# Patient Record
Sex: Male | Born: 1935 | Race: White | Hispanic: No | Smoking: Former smoker
Health system: Southern US, Community
[De-identification: ages and names within clinical notes are randomized; demographics above are authoritative.]

## PROBLEM LIST (undated history)

## (undated) DIAGNOSIS — N183 Chronic kidney disease, stage 3 unspecified: Secondary | ICD-10-CM

## (undated) DIAGNOSIS — I5042 Chronic combined systolic (congestive) and diastolic (congestive) heart failure: Secondary | ICD-10-CM

## (undated) DIAGNOSIS — I441 Atrioventricular block, second degree: Secondary | ICD-10-CM

## (undated) DIAGNOSIS — K6389 Other specified diseases of intestine: Secondary | ICD-10-CM

## (undated) DIAGNOSIS — I447 Left bundle-branch block, unspecified: Secondary | ICD-10-CM

## (undated) DIAGNOSIS — I2510 Atherosclerotic heart disease of native coronary artery without angina pectoris: Secondary | ICD-10-CM

## (undated) DIAGNOSIS — I714 Abdominal aortic aneurysm, without rupture, unspecified: Secondary | ICD-10-CM

## (undated) DIAGNOSIS — F329 Major depressive disorder, single episode, unspecified: Secondary | ICD-10-CM

## (undated) DIAGNOSIS — I739 Peripheral vascular disease, unspecified: Secondary | ICD-10-CM

## (undated) DIAGNOSIS — N400 Enlarged prostate without lower urinary tract symptoms: Secondary | ICD-10-CM

## (undated) DIAGNOSIS — F32A Depression, unspecified: Secondary | ICD-10-CM

## (undated) DIAGNOSIS — I219 Acute myocardial infarction, unspecified: Secondary | ICD-10-CM

## (undated) DIAGNOSIS — I119 Hypertensive heart disease without heart failure: Secondary | ICD-10-CM

## (undated) DIAGNOSIS — K648 Other hemorrhoids: Secondary | ICD-10-CM

## (undated) DIAGNOSIS — E785 Hyperlipidemia, unspecified: Secondary | ICD-10-CM

## (undated) DIAGNOSIS — I6529 Occlusion and stenosis of unspecified carotid artery: Secondary | ICD-10-CM

## (undated) DIAGNOSIS — Q602 Renal agenesis, unspecified: Secondary | ICD-10-CM

## (undated) DIAGNOSIS — C679 Malignant neoplasm of bladder, unspecified: Secondary | ICD-10-CM

## (undated) DIAGNOSIS — I359 Nonrheumatic aortic valve disorder, unspecified: Secondary | ICD-10-CM

## (undated) DIAGNOSIS — B029 Zoster without complications: Secondary | ICD-10-CM

## (undated) DIAGNOSIS — J449 Chronic obstructive pulmonary disease, unspecified: Secondary | ICD-10-CM

## (undated) DIAGNOSIS — Z950 Presence of cardiac pacemaker: Secondary | ICD-10-CM

## (undated) HISTORY — DX: Left bundle-branch block, unspecified: I44.7

## (undated) HISTORY — DX: Occlusion and stenosis of unspecified carotid artery: I65.29

## (undated) HISTORY — DX: Abdominal aortic aneurysm, without rupture, unspecified: I71.40

## (undated) HISTORY — DX: Peripheral vascular disease, unspecified: I73.9

## (undated) HISTORY — PX: HEMORROIDECTOMY: SUR656

## (undated) HISTORY — DX: Abdominal aortic aneurysm, without rupture: I71.4

## (undated) HISTORY — PX: EYE SURGERY: SHX253

## (undated) HISTORY — PX: HEMIARTHROPLASTY SHOULDER FRACTURE: SUR653

## (undated) HISTORY — PX: CORONARY ARTERY BYPASS GRAFT: SHX141

## (undated) HISTORY — PX: REFRACTIVE SURGERY: SHX103

## (undated) HISTORY — DX: Other hemorrhoids: K64.8

## (undated) HISTORY — PX: CARDIAC CATHETERIZATION: SHX172

## (undated) HISTORY — DX: Other specified diseases of intestine: K63.89

## (undated) HISTORY — DX: Chronic obstructive pulmonary disease, unspecified: J44.9

## (undated) HISTORY — PX: ANAL FISSURE REPAIR: SHX2312

## (undated) HISTORY — DX: Nonrheumatic aortic valve disorder, unspecified: I35.9

## (undated) HISTORY — PX: CORONARY ANGIOPLASTY WITH STENT PLACEMENT: SHX49

---
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
## 1979-01-29 HISTORY — PX: CARPAL TUNNEL RELEASE: SHX101

## 1989-01-28 HISTORY — PX: CAROTID-SUBCLAVIAN BYPASS GRAFT: SHX910

## 1989-01-28 HISTORY — PX: CAROTID ENDARTERECTOMY: SUR193

## 1996-05-30 HISTORY — PX: ABDOMINAL AORTIC ANEURYSM REPAIR: SUR1152

## 1996-05-30 HISTORY — PX: CHOLECYSTECTOMY: SHX55

## 1997-10-28 ENCOUNTER — Other Ambulatory Visit: Admission: RE | Admit: 1997-10-28 | Discharge: 1997-10-28 | Payer: Self-pay | Admitting: Cardiology

## 1997-11-11 ENCOUNTER — Inpatient Hospital Stay (HOSPITAL_COMMUNITY): Admission: EM | Admit: 1997-11-11 | Discharge: 1997-11-15 | Payer: Self-pay | Admitting: General Surgery

## 1998-04-02 ENCOUNTER — Observation Stay (HOSPITAL_COMMUNITY): Admission: AD | Admit: 1998-04-02 | Discharge: 1998-04-03 | Payer: Self-pay | Admitting: Cardiology

## 1998-09-21 ENCOUNTER — Ambulatory Visit (HOSPITAL_COMMUNITY): Admission: RE | Admit: 1998-09-21 | Discharge: 1998-09-22 | Payer: Self-pay | Admitting: Cardiology

## 1998-10-13 ENCOUNTER — Encounter (HOSPITAL_COMMUNITY): Admission: RE | Admit: 1998-10-13 | Discharge: 1998-12-28 | Payer: Self-pay | Admitting: Cardiology

## 1998-10-22 ENCOUNTER — Encounter: Payer: Self-pay | Admitting: Emergency Medicine

## 1998-10-22 ENCOUNTER — Inpatient Hospital Stay (HOSPITAL_COMMUNITY): Admission: EM | Admit: 1998-10-22 | Discharge: 1998-10-27 | Payer: Self-pay | Admitting: Emergency Medicine

## 1998-12-29 ENCOUNTER — Encounter (HOSPITAL_COMMUNITY): Admission: RE | Admit: 1998-12-29 | Discharge: 1999-03-29 | Payer: Self-pay | Admitting: Cardiology

## 1999-01-04 ENCOUNTER — Inpatient Hospital Stay (HOSPITAL_COMMUNITY): Admission: EM | Admit: 1999-01-04 | Discharge: 1999-01-13 | Payer: Self-pay | Admitting: Emergency Medicine

## 1999-01-04 ENCOUNTER — Encounter: Payer: Self-pay | Admitting: Cardiology

## 1999-01-07 ENCOUNTER — Encounter: Payer: Self-pay | Admitting: Surgery

## 1999-01-08 ENCOUNTER — Encounter: Payer: Self-pay | Admitting: Surgery

## 1999-01-09 ENCOUNTER — Encounter: Payer: Self-pay | Admitting: Surgery

## 1999-01-10 ENCOUNTER — Encounter: Payer: Self-pay | Admitting: Surgery

## 1999-01-11 ENCOUNTER — Encounter: Payer: Self-pay | Admitting: Surgery

## 1999-02-09 ENCOUNTER — Encounter (HOSPITAL_COMMUNITY): Admission: RE | Admit: 1999-02-09 | Discharge: 1999-05-10 | Payer: Self-pay | Admitting: Cardiology

## 1999-07-26 ENCOUNTER — Ambulatory Visit (HOSPITAL_COMMUNITY): Admission: RE | Admit: 1999-07-26 | Discharge: 1999-07-26 | Payer: Self-pay | Admitting: Gastroenterology

## 2003-03-05 ENCOUNTER — Encounter: Payer: Self-pay | Admitting: Cardiology

## 2003-03-05 ENCOUNTER — Encounter: Admission: RE | Admit: 2003-03-05 | Discharge: 2003-03-05 | Payer: Self-pay | Admitting: Cardiology

## 2003-03-26 ENCOUNTER — Ambulatory Visit (HOSPITAL_COMMUNITY): Admission: RE | Admit: 2003-03-26 | Discharge: 2003-03-26 | Payer: Self-pay | Admitting: Urology

## 2003-03-28 ENCOUNTER — Ambulatory Visit (HOSPITAL_COMMUNITY): Admission: RE | Admit: 2003-03-28 | Discharge: 2003-03-28 | Payer: Self-pay | Admitting: Urology

## 2003-09-05 ENCOUNTER — Encounter: Admission: RE | Admit: 2003-09-05 | Discharge: 2003-09-05 | Payer: Self-pay | Admitting: Cardiology

## 2003-09-18 ENCOUNTER — Emergency Department (HOSPITAL_COMMUNITY): Admission: EM | Admit: 2003-09-18 | Discharge: 2003-09-18 | Payer: Self-pay | Admitting: Emergency Medicine

## 2005-10-03 ENCOUNTER — Ambulatory Visit: Payer: Self-pay | Admitting: Internal Medicine

## 2005-11-15 ENCOUNTER — Ambulatory Visit: Payer: Self-pay | Admitting: Internal Medicine

## 2005-11-22 ENCOUNTER — Ambulatory Visit: Payer: Self-pay | Admitting: Internal Medicine

## 2005-12-14 ENCOUNTER — Ambulatory Visit: Payer: Self-pay | Admitting: Internal Medicine

## 2006-01-25 ENCOUNTER — Ambulatory Visit: Payer: Self-pay | Admitting: Internal Medicine

## 2006-02-15 ENCOUNTER — Ambulatory Visit: Payer: Self-pay | Admitting: Internal Medicine

## 2006-04-27 ENCOUNTER — Ambulatory Visit: Payer: Self-pay | Admitting: Internal Medicine

## 2006-06-27 ENCOUNTER — Encounter (INDEPENDENT_AMBULATORY_CARE_PROVIDER_SITE_OTHER): Payer: Self-pay | Admitting: Cardiology

## 2006-06-27 ENCOUNTER — Observation Stay (HOSPITAL_COMMUNITY): Admission: EM | Admit: 2006-06-27 | Discharge: 2006-06-28 | Payer: Self-pay | Admitting: Emergency Medicine

## 2006-06-29 ENCOUNTER — Inpatient Hospital Stay (HOSPITAL_COMMUNITY): Admission: EM | Admit: 2006-06-29 | Discharge: 2006-07-03 | Payer: Self-pay | Admitting: Emergency Medicine

## 2006-07-19 ENCOUNTER — Ambulatory Visit: Payer: Self-pay | Admitting: Pulmonary Disease

## 2006-07-20 ENCOUNTER — Encounter: Admission: RE | Admit: 2006-07-20 | Discharge: 2006-09-13 | Payer: Self-pay | Admitting: Orthopedic Surgery

## 2006-09-04 ENCOUNTER — Inpatient Hospital Stay (HOSPITAL_COMMUNITY): Admission: RE | Admit: 2006-09-04 | Discharge: 2006-09-06 | Payer: Self-pay | Admitting: Orthopedic Surgery

## 2006-09-18 ENCOUNTER — Encounter: Admission: RE | Admit: 2006-09-18 | Discharge: 2006-12-07 | Payer: Self-pay | Admitting: Orthopedic Surgery

## 2007-06-07 ENCOUNTER — Ambulatory Visit: Payer: Self-pay | Admitting: Gastroenterology

## 2007-06-20 ENCOUNTER — Ambulatory Visit: Payer: Self-pay | Admitting: Internal Medicine

## 2007-07-05 ENCOUNTER — Ambulatory Visit: Payer: Self-pay | Admitting: Gastroenterology

## 2008-06-19 ENCOUNTER — Ambulatory Visit: Payer: Self-pay | Admitting: Internal Medicine

## 2008-10-06 ENCOUNTER — Encounter: Payer: Self-pay | Admitting: Internal Medicine

## 2008-10-06 ENCOUNTER — Telehealth (INDEPENDENT_AMBULATORY_CARE_PROVIDER_SITE_OTHER): Payer: Self-pay | Admitting: *Deleted

## 2008-12-15 ENCOUNTER — Telehealth: Payer: Self-pay | Admitting: Internal Medicine

## 2009-03-30 ENCOUNTER — Ambulatory Visit: Payer: Self-pay | Admitting: Internal Medicine

## 2009-03-30 DIAGNOSIS — I2510 Atherosclerotic heart disease of native coronary artery without angina pectoris: Secondary | ICD-10-CM

## 2009-03-31 ENCOUNTER — Telehealth: Payer: Self-pay | Admitting: Internal Medicine

## 2009-04-01 ENCOUNTER — Encounter (INDEPENDENT_AMBULATORY_CARE_PROVIDER_SITE_OTHER): Payer: Self-pay | Admitting: *Deleted

## 2009-04-10 LAB — CONVERTED CEMR LAB
Cholesterol: 180 mg/dL
HDL: 30 mg/dL
LDL Cholesterol: 91 mg/dL
Triglyceride fasting, serum: 296 mg/dL

## 2009-05-01 ENCOUNTER — Encounter: Payer: Self-pay | Admitting: Internal Medicine

## 2009-05-11 ENCOUNTER — Ambulatory Visit: Payer: Self-pay | Admitting: Internal Medicine

## 2009-06-11 ENCOUNTER — Telehealth: Payer: Self-pay | Admitting: Internal Medicine

## 2009-06-16 ENCOUNTER — Telehealth: Payer: Self-pay | Admitting: Internal Medicine

## 2009-06-22 ENCOUNTER — Ambulatory Visit: Payer: Self-pay | Admitting: Internal Medicine

## 2009-11-10 ENCOUNTER — Ambulatory Visit: Payer: Self-pay | Admitting: Internal Medicine

## 2009-11-26 ENCOUNTER — Encounter: Payer: Self-pay | Admitting: Internal Medicine

## 2010-04-06 ENCOUNTER — Encounter: Payer: Self-pay | Admitting: Internal Medicine

## 2010-05-11 ENCOUNTER — Ambulatory Visit: Payer: Self-pay | Admitting: Internal Medicine

## 2010-05-17 ENCOUNTER — Ambulatory Visit: Payer: Self-pay | Admitting: Internal Medicine

## 2010-05-19 ENCOUNTER — Ambulatory Visit: Payer: Self-pay | Admitting: Gastroenterology

## 2010-06-29 ENCOUNTER — Ambulatory Visit
Admission: RE | Admit: 2010-06-29 | Discharge: 2010-06-29 | Payer: Self-pay | Source: Home / Self Care | Attending: Internal Medicine | Admitting: Internal Medicine

## 2010-06-29 NOTE — Assessment & Plan Note (Signed)
Summary: Pulmonary/ final regular f/u   Primary Provider/Referring Provider:  Newt Lukes MD  CC:  Yearly followup.  Pt states that his breathing is fine.  He denies any complaints..  History of Present Illness: 69 yowm  who quit smoking in 1998 with an FEV1 of 61% recorded in August of 2007 at wt low 190s maintained on monotherapy  with no significant variability between his good and bad days or need for rescue therapy with primatene  June 19, 2008 ov to refill  spiriva using primatene after exerts for faster recovery.  no increase doe, cough or tendency to exacerbations.  ex tol = > moderate adl's.   June 22, 2009 Yearly followup.  Pt states that his breathing is not limiting him but he is relatively sedentary.  Pt denies any significant sore throat, dysphagia, itching, sneezing,  nasal congestion or excess secretions,  fever, chills, sweats, unintended wt loss, pleuritic or exertional cp, hempoptysis, change in activity tolerance  orthopnea pnd or leg swelling.  Pt also denies any obvious fluctuation in symptoms with weather or environmental change or other alleviating or aggravating factors.        Current Medications (verified): 1)  Plavix 75 Mg  Tabs (Clopidogrel Bisulfate) .... Take 1 Tablet By Mouth Once A Day 2)  Toprol Xl 100 Mg  Tb24 (Metoprolol Succinate) .... Take 1 Tablet By Mouth Once A Day 3)  Crestor 20 Mg  Tabs (Rosuvastatin Calcium) .... Take 1 Tablet By Mouth Once A Day 4)  Spiriva Handihaler 18 Mcg  Caps (Tiotropium Bromide Monohydrate) .... Inhale One Capsule Every Day 5)  Primatene Mist 0.22 Mg/act Aers (Epinephrine Base) .... As Needed 6)  Micardis 40 Mg Tabs (Telmisartan) .... Take 1 By Mouth Qd 7)  Bayer Low Strength 81 Mg Tbec (Aspirin) .... Take 1 By Mouth Qd 8)  Fish Oil 1000 Mg Caps (Omega-3 Fatty Acids) .... Take 1 Once Daily 9)  Zolpidem Tartrate 10 Mg Tabs (Zolpidem Tartrate) .... 1/2-1 Tab By Mouth At Bedtime As Needed For  Insomnia  Allergies (verified): 1)  ! * Codeine 2)  ! Biaxin 3)  ! Percocet  Past History:  Past Medical History: Hypertension COPD    - PFT's 11/15/05  FEV1 46%  ratio 60 with 25% response to B2    - PFT's 01/25/06  FEV1 61%, ratio 46%,  no better after B2 Hyperlipidemia Gout Depression CAD - s/p CABG '89, redo '00 Dyslipidemia chronic LBBB diastolic CHF PVD s/p B CEA  Vital Signs:  Patient profile:   77 year old male Weight:      199 pounds O2 Sat:      98 % on Room air Temp:     98.0 degrees F oral Pulse rate:   42 / minute BP sitting:   120 / 60  (left arm)  Vitals Entered By: Vernie Murders (June 22, 2009 11:48 AM)  O2 Flow:  Room air  Physical Exam  Additional Exam:  wt 197 > 185 June 19, 2008 > 199 June 22, 2009  HEENT mild turbinate edema.  Oropharynx no thrush or excess pnd or cobblestoning.  No JVD or cervical adenopathy. Mild accessory muscle hypertrophy. Trachea midline, nl thryroid. Chest was hyperinflated by percussion with diminished breath sounds and moderate increased exp time without wheeze. Hoover sign positive at mid inspiration. Regular rate and rhythm without murmur gallop or rub or increase P2.  Abd: no hsm, nl excursion. Ext warm without C,C or E.  Impression & Recommendations:  Problem # 1:  COPD (ICD-496) Improvement initially in FEV1 after initiating rx  and now not limited by dyspnea though not very active and has gained wt since quit smoking.  Treatment at this point is entirely directed toward limiting symptoms, which he denies, so there's no need to escalate or modify rx.  I did encourage increase in activity to prevent deconditioning and further wt gain, with option for pulmonary rebhab if he wants to pursue it.  See instructions for specific recommendations   Other Orders: Est. Patient Level III ([address])  Patient Instructions: 1)  You can return here as needed if you're not happy with spiriva and as needed primatene

## 2010-06-29 NOTE — Progress Notes (Signed)
Summary: prescript-  Phone Note Call from Patient   Caller: Patient Call For: Beonka Amesquita Summary of Call: need prescript for spiriva 3 months supply sent to [facility] Initial call taken by: Rickard Patience,  June 11, 2009 12:06 PM  Follow-up for Phone Call        per last OV note 05/2008, pt was directed to est with a PCP and have them refill pt's spiriva.  pt has since become est with Dr. Felicity Coyer.  LMOM TCB. Boone Master CNA  June 11, 2009 12:17 PM   Additional Follow-up for Phone Call Additional follow up Details #1::        LMTCB. Carron Curie CMA  June 12, 2009 3:47 PM  pt advised to get refills form PMD. Pt states he ahs appt with MW next week and will discuss with him at that time.Carron Curie CMA  June 15, 2009 11:41 AM     Additional Follow-up for Phone Call Additional follow up Details #2::    Returning triage's phone call, would like a call back. Follow-up by: Darletta Moll,  June 11, 2009 3:00 PM

## 2010-06-29 NOTE — Letter (Signed)
Summary: Viann Fish MD  Viann Fish MD   Imported By: Sherian Rein 04/15/2010 15:02:03  _____________________________________________________________________  External Attachment:    Type:   Image     Comment:   External Document

## 2010-06-29 NOTE — Assessment & Plan Note (Signed)
Summary: 6 mos f/u // # / cd   Vital Signs:  Patient profile:   77 year old male Height:      70 inches (177.80 cm) Weight:      193.8 pounds (88.09 kg) O2 Sat:      94 % on Room air Temp:     97.0 degrees F (36.11 degrees C) oral Pulse rate:   57 / minute BP sitting:   122 / 70  (left arm) Cuff size:   regular  Vitals Entered By: Orlan Leavens (November 10, 2009 10:04 AM)  O2 Flow:  Room air CC: 6 month f/u. Pt is req rx for Temazepam Is Patient Diabetic? No Pain Assessment Patient in pain? no        Primary Care Provider:  Newt Lukes MD  CC:  6 month f/u. Pt is req rx for Temazepam.  History of Present Illness: here for followup -  c/o continued insomnia -  onset summer 2010 following death of wife assoc with daytime fatigue and sleepiness distracted by racing thoughts and "second guessing" about the end of life mgmt of his late wife denies "depression" - "its just a normal for me without her" denies SI or tearfulness prior trial of restoril from cardiologist back in May 2010 worked better than Ambein 10mg  left over from wife's meds prev has tried trazadone and elavil w/o change in symptoms   CAD hx - labs from dr. Donnie Aho (cards) reviewed today denies CP or angina symptoms - no recent changes in cardiac meds planning nuc stres test next week but denies symptoms of angina "its just time to check i think"  CPOD - reports compliance with ongoing medical treatment and no changes in medication dose or frequency. denies adverse side effects related to current therapy. no SOB, DOE -   right shoulder  "frozen" c/o limited Right USEuse - hard to lift arm overhead since surg 2 years ago no pain in arm, hand or the shoulder "if i take my hydrocodone now and then" no neck pain or numbness   Clinical Review Panels:  Lipid Management   Cholesterol:  180 (04/10/2009)   LDL (bad choesterol):  91 (04/10/2009)   HDL (good cholesterol):  30 (04/10/2009)   Triglycerides:   296 (04/10/2009)   Current Medications (verified): 1)  Plavix 75 Mg  Tabs (Clopidogrel Bisulfate) .... Take 1 Tablet By Mouth Once A Day 2)  Toprol Xl 100 Mg  Tb24 (Metoprolol Succinate) .... Take 1 Tablet By Mouth Once A Day 3)  Crestor 20 Mg  Tabs (Rosuvastatin Calcium) .... Take 1 Tablet By Mouth Once A Day 4)  Spiriva Handihaler 18 Mcg  Caps (Tiotropium Bromide Monohydrate) .... Inhale One Capsule Every Day 5)  Primatene Mist 0.22 Mg/act Aers (Epinephrine Base) .... As Needed 6)  Micardis 40 Mg Tabs (Telmisartan) .... Take 1 By Mouth Qd 7)  Bayer Low Strength 81 Mg Tbec (Aspirin) .... Take 1 By Mouth Qd 8)  Fish Oil 1000 Mg Caps (Omega-3 Fatty Acids) .... Take 1 Once Daily  Allergies (verified): 1)  ! * Codeine 2)  ! Biaxin 3)  ! Percocet  Past History:  Past Medical History: Hypertension COPD    - PFT's 11/15/05  FEV1 46%  ratio 60 with 25% response to B2    - PFT's 01/25/06  FEV1 61%, ratio 46%,  no better after B2 Hyperlipidemia Gout Depression CAD - s/p CABG '89, redo '00 Dyslipidemia chronic LBBB diastolic CHF PVD s/p B CEA  MD roster: pulm - wert card - tilley  Review of Systems  The patient denies anorexia, fever, weight loss, and syncope.    Physical Exam  General:  alert, well-developed, well-nourished, and cooperative to examination.    Lungs:  normal respiratory effort, no intercostal retractions or use of accessory muscles; normal breath sounds bilaterally - no crackles and no wheezes.    Heart:  normal rate, regular rhythm, no murmur, and no rub. BLE without edema Psych:  Oriented X3, memory intact for recent and remote, less depressed affect, good eye contact, not  tearful nor easily distracted.     Impression & Recommendations:  Problem # 1:  INSOMNIA, CHRONIC (ICD-307.42)  use restoril as Ambien, trazadone and elavil ineffective-  advised on limiting its use to minimize tolerance needs f/u every 6 mos or sooner  Problem # 2:  HYPERLIPIDEMIA  (ICD-272.4)  His updated medication list for this problem includes:    Crestor 20 Mg Tabs (Rosuvastatin calcium) .Marland Kitchen... Take 1 tablet by mouth once a day  labs/med followed by cards    HDL:30 (04/10/2009)  LDL:91 (04/10/2009)  Chol:180 (04/10/2009)  Trig:296 (04/10/2009)  Problem # 3:  HYPERTENSION (ICD-401.9)  His updated medication list for this problem includes:    Toprol Xl 100 Mg Tb24 (Metoprolol succinate) .Marland Kitchen... Take 1 tablet by mouth once a day    Micardis 40 Mg Tabs (Telmisartan) .Marland Kitchen... Take 1 by mouth qd  BP today: 122/70 Prior BP: 120/60 (06/22/2009)  Labs Reviewed: Chol: 180 (04/10/2009)   HDL: 30 (04/10/2009)   LDL: 91 (04/10/2009)   TG: 296 (04/10/2009)  Problem # 4:  COPD (ICD-496)  His updated medication list for this problem includes:    Spiriva Handihaler 18 Mcg Caps (Tiotropium bromide monohydrate) ..... Inhale one capsule every day    Primatene Mist 0.22 Mg/act Aers (Epinephrine base) .Marland Kitchen... As needed per pulm - symptoms well controlled Improvement initially in FEV1 after initiating rx  and now not limited by dyspnea though not very active and has gained wt since quit smoking.  Treatment at this point is entirely directed toward limiting symptoms, which he denies, so there's no need to escalate or modify rx.  I did encourage increase in activity to prevent deconditioning and further wt gain, with option for pulmonary rebhab if he wants to pursue it.  Complete Medication List: 1)  Plavix 75 Mg Tabs (Clopidogrel bisulfate) .... Take 1 tablet by mouth once a day 2)  Toprol Xl 100 Mg Tb24 (Metoprolol succinate) .... Take 1 tablet by mouth once a day 3)  Crestor 20 Mg Tabs (Rosuvastatin calcium) .... Take 1 tablet by mouth once a day 4)  Spiriva Handihaler 18 Mcg Caps (Tiotropium bromide monohydrate) .... Inhale one capsule every day 5)  Primatene Mist 0.22 Mg/act Aers (Epinephrine base) .... As needed 6)  Micardis 40 Mg Tabs (Telmisartan) .... Take 1 by mouth qd 7)   Bayer Low Strength 81 Mg Tbec (Aspirin) .... Take 1 by mouth qd 8)  Fish Oil 1000 Mg Caps (Omega-3 fatty acids) .... Take 1 once daily 9)  Temazepam 15 Mg Caps (Temazepam) .Marland Kitchen.. 1 by mouth at bedtime as needed  Patient Instructions: 1)  it was good to see you today.  2)  restoril prescribed for your insomnia - use with caution as discussed to minimze your development of medication tolerance  3)  continue followup with dr. Donnie Aho and wert as ongoing 4)  Please schedule a follow-up appointment in 6 months, sooner if problems.  Prescriptions: TEMAZEPAM 15 MG CAPS (TEMAZEPAM) 1 by mouth at bedtime as needed  #30 x 5   Entered and Authorized by:   Newt Lukes MD   Signed by:   Newt Lukes MD on 11/10/2009   Method used:   Print then Give to Patient   RxID:   [id number]

## 2010-06-29 NOTE — Progress Notes (Signed)
Summary: RX REQUEST  Phone Note Call from Patient Call back at Home Phone [phone]   Caller: Patient Call For: Edward Lukes MD Summary of Call: Pt came into the office and stated that Dr. Sherene Sires wants him to get a refill of SPIRIVA through his PCP. Pt stated that he used [facility] and he requesting 90 days supplies. Please advice.  Initial call taken by: Livingston Diones,  June 16, 2009 10:39 AM  Follow-up for Phone Call        ok to send to [facility] as requested Follow-up by: Edward Lukes MD,  June 16, 2009 11:02 AM  Additional Follow-up for Phone Call Additional follow up Details #1::        Called pt no ansew [facility] rx sent to [facility] Additional Follow-up by: Orlan Leavens,  June 16, 2009 11:25 AM    Prescriptions: SPIRIVA HANDIHALER 18 MCG  CAPS (TIOTROPIUM BROMIDE MONOHYDRATE) inhale one capsule every day  #90 x 3   Entered by:   Orlan Leavens   Authorized by:   Edward Lukes MD   Signed by:   Orlan Leavens on 06/16/2009   Method used:   Faxed to ...       [facility]* (mail-order)             ,          Ph: [phone]       Fax: [phone]   RxID:   [id number]

## 2010-07-01 NOTE — Assessment & Plan Note (Signed)
Summary: 6 MTH FU--STC   Vital Signs:  Patient profile:   77 year old male Height:      70 inches (177.80 cm) Weight:      189.8 pounds (86.27 kg) BMI:     27.33 O2 Sat:      92 % on Room air Temp:     97.9 degrees F (36.61 degrees C) oral Pulse rate:   74 / minute BP sitting:   102 / 60  (left arm) Cuff size:   regular  Vitals Entered By: Orlan Leavens RMA (May 11, 2010 10:44 AM)  O2 Flow:  Room air CC: 6 month follow-up Is Patient Diabetic? No Pain Assessment Patient in pain? no      Comments Pt also c/o chest congestion x's 3 days   Primary Care Provider:  Newt Lukes MD  CC:  6 month follow-up.  History of Present Illness: here for followup -  hx insomnia -  onset summer 2010 following death of wife assoc with daytime fatigue and sleepiness distracted by racing thoughts and "second guessing" about the end of life mgmt of his late wife denies "depression" - "its just a normal for me without her" denies SI or tearfulness prior trial of restoril worked better than Ambein 10mg  left over from wife's meds prev has tried trazadone and elavil w/o change in symptoms  resolved with restoril - needs refill on same  CAD hx - labs from dr. Donnie Aho (cards) reviewed today denies CP or angina symptoms - no recent changes in cardiac meds planning nuc stres test next week but denies symptoms of angina "its just time to check i think"  COPD - new cough symptoms x 2 weeks, green sputum, no fever - reports compliance with ongoing medical treatment and no changes in medication dose or frequency. denies adverse side effects related to current therapy. no SOB, DOE -   right shoulder  "frozen" c/o limited Right USEuse - hard to lift arm overhead since surg 2 years ago no pain in arm, hand or the shoulder "if i take my hydrocodone now and then" no neck pain or numbness   Clinical Review Panels:  Lipid Management   Cholesterol:  180 (04/10/2009)   LDL (bad choesterol):   91 (04/10/2009)   HDL (good cholesterol):  30 (04/10/2009)   Triglycerides:  296 (04/10/2009)   Current Medications (verified): 1)  Plavix 75 Mg  Tabs (Clopidogrel Bisulfate) .... Take 1 Tablet By Mouth Once A Day 2)  Toprol Xl 100 Mg  Tb24 (Metoprolol Succinate) .... Take 1 Tablet By Mouth Once A Day 3)  Crestor 20 Mg  Tabs (Rosuvastatin Calcium) .... Take 1 Tablet By Mouth Once A Day 4)  Spiriva Handihaler 18 Mcg  Caps (Tiotropium Bromide Monohydrate) .... Inhale One Capsule Every Day 5)  Primatene Mist 0.22 Mg/act Aers (Epinephrine Base) .... As Needed 6)  Micardis 40 Mg Tabs (Telmisartan) .... Take 1 By Mouth Qd 7)  Bayer Low Strength 81 Mg Tbec (Aspirin) .... Take 1 By Mouth Qd 8)  Fish Oil 1000 Mg Caps (Omega-3 Fatty Acids) .... Take 1 Once Daily 9)  Temazepam 15 Mg Caps (Temazepam) .Marland Kitchen.. 1 By Mouth At Bedtime As Needed  Allergies (verified): 1)  ! * Codeine 2)  ! Biaxin 3)  ! Percocet  Past History:  Past Medical History: Hypertension COPD    - PFT's 11/15/05  FEV1 46%  ratio 60 with 25% response to B2    - PFT's 01/25/06  FEV1  61%, ratio 46%,  no better after B2 Hyperlipidemia Gout Depression CAD - s/p CABG '89, redo '00  Dyslipidemia chronic LBBB diastolic CHF PVD s/p B CEA  MD roster:  pulm - wert card - tilley  Review of Systems  The patient denies weight gain, chest pain, syncope, and headaches.    Physical Exam  General:  alert, well-developed, well-nourished, and cooperative to examination.    Lungs:  normal respiratory effort, no intercostal retractions or use of accessory muscles; good breath sounds bilaterally - no crackles and no wheezes but few scattered rhonchi   Heart:  normal rate, regular rhythm, no murmur, and no rub. BLE without edema   Impression & Recommendations:  Problem # 1:  CHRONIC OBSTRUCTIVE PULMONARY DISEASE, ACUTE EXACERBATION (ICD-491.21)  no wheeze so hold steroids but add 7d doxy for productive sputum - erx  done  Orders: Prescription Created Electronically [phone])  Problem # 2:  COPD (ICD-496)  The following medications were removed from the medication list:    Primatene Mist 0.22 Mg/act Aers (Epinephrine base) .Marland Kitchen... As needed His updated medication list for this problem includes:    Spiriva Handihaler 18 Mcg Caps (Tiotropium bromide monohydrate) ..... Inhale one capsule every day   per pulm - symptoms generally well controlled Improvement initially in FEV1 after initiating rx  and now not limited by dyspnea though not very active and has gained wt since quit smoking.  Treatment at this point is entirely directed toward limiting symptoms, which he denies, so there's no need to escalate or modify rx.  I did encourage increase in activity to prevent deconditioning and further wt gain, with option for pulmonary rebhab if he wants to pursue it.  Pulmonary Functions Reviewed: O2 sat: 92 (05/11/2010)     Vaccines Reviewed: Flu Vax: Fluvax 3+ (03/30/2009)  Problem # 3:  INSOMNIA, CHRONIC (ICD-307.42)  use restoril as Ambien, trazadone and elavil ineffective- refill today advised on limiting its use to minimize tolerance needs f/u every 6 mos or sooner  Problem # 4:  HYPERTENSION (ICD-401.9)  His updated medication list for this problem includes:    Toprol Xl 100 Mg Tb24 (Metoprolol succinate) .Marland Kitchen... Take 1 tablet by mouth once a day    Micardis 40 Mg Tabs (Telmisartan) .Marland Kitchen... Take 1 by mouth qd  BP today: 102/60 Prior BP: 122/70 (11/10/2009)  Labs Reviewed: Chol: 180 (04/10/2009)   HDL: 30 (04/10/2009)   LDL: 91 (04/10/2009)   TG: 296 (04/10/2009)  Complete Medication List: 1)  Plavix 75 Mg Tabs (Clopidogrel bisulfate) .... Take 1 tablet by mouth once a day 2)  Toprol Xl 100 Mg Tb24 (Metoprolol succinate) .... Take 1 tablet by mouth once a day 3)  Crestor 20 Mg Tabs (Rosuvastatin calcium) .... Take 1 tablet by mouth once a day 4)  Spiriva Handihaler 18 Mcg Caps (Tiotropium bromide  monohydrate) .... Inhale one capsule every day 5)  Micardis 40 Mg Tabs (Telmisartan) .... Take 1 by mouth qd 6)  Bayer Low Strength 81 Mg Tbec (Aspirin) .... Take 1 by mouth qd 7)  Fish Oil 1000 Mg Caps (Omega-3 fatty acids) .... Take 1 once daily 8)  Temazepam 15 Mg Caps (Temazepam) .Marland Kitchen.. 1 by mouth at bedtime as needed 9)  Doxycycline Hyclate 100 Mg Tabs (Doxycycline hyclate) .Marland Kitchen.. 1 by mouth two times a day x 7 days  Patient Instructions: 1)  it was good to see you today.  2)  restoril refill prescribed for your insomnia today- continue to use with caution  to minimze your development of medication tolerance  3)  continue followup with dr. Donnie Aho and wert as ongoing 4)  doxycycline antibioitcs for ocugh symptoms -  5)  your prescriptions have been electronically submitted or faxed to your pharmacy. Please take as directed. Contact our office if you believe you're having problems with the medication(s). 6)  Please schedule a follow-up appointment in 6 months, sooner if problems.  7)  Get plenty of rest, drink lots of clear liquids, and use Tylenol or Ibuprofen for fever and comfort. Return in 7-10 days if you're not better:sooner if you're feeling worse. Prescriptions: TEMAZEPAM 15 MG CAPS (TEMAZEPAM) 1 by mouth at bedtime as needed  #30 x 5   Entered and Authorized by:   Newt Lukes MD   Signed by:   Newt Lukes MD on 05/11/2010   Method used:   Printed then faxed to ...       [facility]  [address] [id number]* (retail)       [address]       [place], [place]  [address]       Ph: [phone] or [phone]       Fax: [phone]   RxID:   [id number] DOXYCYCLINE HYCLATE 100 MG TABS (DOXYCYCLINE HYCLATE) 1 by mouth two times a day x 7 days  #14 x 0   Entered and Authorized by:   Newt Lukes MD   Signed by:   Newt Lukes MD on 05/11/2010   Method used:   Electronically to        [facility]  [address] [id number]* (retail)       [address]       [place], [place]  [address]       Ph: [phone] or [phone]       Fax: [phone]   RxID:   [id number]    Orders Added: 1)  Est. Patient Level IV [83151] 2)  Prescription Created Electronically [phone]

## 2010-07-01 NOTE — Assessment & Plan Note (Signed)
Summary: Pulmonary/ ext ov with dpi teaching 90%   Primary Provider/Referring Provider:  Newt Lukes MD  CC:  Dyspnea- the same .  History of Present Illness: 41  yowm  who quit smoking in 1998 with an FEV1 of 61% recorded in August of 2007 at wt low 190s maintained on monotherapy  with no significant variability between his good and bad days or need for rescue therapy with primatene.  June 19, 2008 ov to refill  spiriva using primatene after exerts for faster recovery.  no increase doe, cough or tendency to exacerbations.  ex tol = > moderate adl's.   June 22, 2009 Yearly followup.  Pt states that his breathing is not limiting him but he is relatively sedentary.  rec  continue spriva  May 17, 2010 ov cc sob worse since ran out of primatene, apparenlty using this much more than reported at last ov.  no purulent sputum.but coughing more now   Pt denies any significant sore throat, dysphagia, itching, sneezing,  nasal congestion or excess secretions,  fever, chills, sweats, unintended wt loss, pleuritic or exertional cp, hempoptysis, change in activity tolerance  orthopnea pnd or leg swelling Pt also denies any obvious fluctuation in symptoms with weather or environmental change or other alleviating or aggravating factors.       Current Medications (verified): 1)  Plavix 75 Mg  Tabs (Clopidogrel Bisulfate) .... Take 1 Tablet By Mouth Once A Day 2)  Toprol Xl 100 Mg  Tb24 (Metoprolol Succinate) .... Take 1 Tablet By Mouth Once A Day 3)  Crestor 20 Mg  Tabs (Rosuvastatin Calcium) .... Take 1 Tablet By Mouth Once A Day 4)  Spiriva Handihaler 18 Mcg  Caps (Tiotropium Bromide Monohydrate) .... Inhale One Capsule Every Day 5)  Micardis 40 Mg Tabs (Telmisartan) .... Take 1 By Mouth Qd 6)  Bayer Low Strength 81 Mg Tbec (Aspirin) .... Take 1 By Mouth Qd 7)  Fish Oil 1000 Mg Caps (Omega-3 Fatty Acids) .... Take 1 Once Daily 8)  Temazepam 15 Mg Caps (Temazepam) .Marland Kitchen.. 1 By Mouth At  Bedtime As Needed 9)  Doxycycline Hyclate 100 Mg Tabs (Doxycycline Hyclate) .Marland Kitchen.. 1 By Mouth Two Times A Day X 7 Days  Allergies (verified): 1)  ! * Codeine 2)  ! Biaxin 3)  ! Percocet  Past History:  Past Medical History: Hypertension COPD    - PFT's 11/15/05  FEV1 46%  ratio 60 with 25% response to B2    - PFT's 01/25/06  FEV1 61%, ratio 46%,  no better after B2    - Add on Advair May 18, 2010  Hyperlipidemia Gout Depression CAD - s/p CABG '89, redo '00  Dyslipidemia chronic LBBB diastolic CHF PVD s/p B CEA  MD roster:  pulm - wert card - tilley  Vital Signs:  Patient profile:   77 year old male Weight:      192 pounds O2 Sat:      99 % on Room air Temp:     98.2 degrees F oral Pulse rate:   82 / minute BP sitting:   118 / 60  (left arm)  Vitals Entered By: Vernie Murders (May 17, 2010 10:19 AM)  O2 Flow:  Room air  Physical Exam  Additional Exam:  wt 197 > 185 June 19, 2008 > 199 June 22, 2009 > 192  May 17, 2010  HEENT mild turbinate edema.  Oropharynx no thrush or excess pnd or cobblestoning.  No JVD or cervical adenopathy.  Mild accessory muscle hypertrophy. Trachea midline, nl thryroid. Chest was hyperinflated by percussion with diminished breath sounds and moderate increased exp time without wheeze. Hoover sign positive at mid inspiration. Regular rate and rhythm without murmur gallop or rub or increase P2.  Abd: no hsm, nl excursion. Ext warm without C,C or E.     Impression & Recommendations:  Problem # 1:  COPD (ICD-496)   DDX of  difficult airways managment all start with A and  include Adherence, Ace Inhibitors, Acid Reflux, Active Sinus Disease, Alpha 1 Antitripsin deficiency, Anxiety masquerading as Airways dz,  ABPA,  allergy(esp in young), Aspiration (esp in elderly), Adverse effects of DPI,  Active smokers, plus one B  = Beta blocker use.  In this case Adherence is the biggest issue and starts with  inability to use HFA  effectively and also  understand that SABA treats the symptoms but doesn't get to the underlying problem (inflammation).  I used  the ananology of putting steroid cream on a rash to help explain the meaning of topical therapy and the need to get the drug to the target tissue.    Try advair I spent extra time with the patient today explaining optimal dpi  technique.  This improved from  75-90%  ? Acid reflux contributing to symptoms, See instructions for specific recommendations   ? Adverse effect of dpi > if so will be worse on advair  Other Orders: Est. Patient Level IV (95621)  Patient Instructions: 1)  Start Advair 250 / 50 one twice daily and rinse and gargle but cough gets worse ok to stop it  2)  Prilosec before bfast and pepcid 20 mg at bedtime as long as coughing ( reflux is to cough what oxygen is to fire)  3)  GERD (REFLUX)  is a common cause of respiratory symptoms. It commonly presents without heartburn and can be treated with medication, but also with lifestyle changes including avoidance of late meals, excessive alcohol, smoking cessation, and avoid fatty foods, chocolate, peppermint, colas, red wine, and acidic juices such as orange juice. NO MINT OR MENTHOL PRODUCTS SO NO COUGH DROPS  4)  USE SUGARLESS CANDY INSTEAD (jolley ranchers)  5)  NO OIL BASED VITAMINS  6)  Please schedule a follow-up appointment in 4  weeks, sooner if needed  with cxr on return   Appended Document: Orders Update     Clinical Lists Changes  Orders: Added new Service order of Est. Patient Level IV (30865) - Signed

## 2010-07-07 NOTE — Assessment & Plan Note (Signed)
Summary: Pulmonary/ ov, better on advair, ? new SPN R   Primary Provider/Referring Provider:  Newt Lukes MD  CC:  Dyspnea- the same.  History of Present Illness: 63  yowm  who quit smoking in 1998 with an FEV1 of 61% recorded in August of 2007 at wt low 190s maintained on monotherapy  with no significant variability between his good and bad days or need for rescue therapy with primatene.  June 19, 2008 ov to refill  spiriva using primatene after exerts for faster recovery.  no increase doe, cough or tendency to exacerbations.  ex tol = > moderate adl's.   June 22, 2009 Yearly followup.  Pt states that his breathing is not limiting him but he is relatively sedentary.  rec  continue spriva  May 17, 2010 ov cc sob worse since ran out of primatene, apparenlty using this much more than reported at last ov.  no purulent sputum.but coughing more now rec Start Advair 250 / 50 one twice daily and rinse and gargle but cough gets worse ok to stop it  Prilosec before bfast and pepcid 20 mg at bedtime as long as coughing ( reflux is to cough what oxygen is to fire)  GERD (REFLUX)  diet cxr on return   June 29, 2010 ov doe x 150 yeards, does shopping ok, less need for primatene, sleeping ok. Pt denies any significant sore throat, dysphagia, itching, sneezing,  nasal congestion or excess secretions,  fever, chills, sweats, unintended wt loss, pleuritic or exertional cp, hempoptysis, change in activity tolerance  orthopnea pnd or leg swelling     Current Medications (verified): 1)  Plavix 75 Mg  Tabs (Clopidogrel Bisulfate) .... Take 1 Tablet By Mouth Once A Day 2)  Toprol Xl 100 Mg  Tb24 (Metoprolol Succinate) .... Take 1 Tablet By Mouth Once A Day 3)  Crestor 20 Mg  Tabs (Rosuvastatin Calcium) .... Take 1 Tablet By Mouth Once A Day 4)  Spiriva Handihaler 18 Mcg  Caps (Tiotropium Bromide Monohydrate) .... Inhale One Capsule Every Day 5)  Micardis 40 Mg Tabs (Telmisartan) ....  Take 1 By Mouth Qd 6)  Bayer Low Strength 81 Mg Tbec (Aspirin) .... Take 1 By Mouth Qd 7)  Fish Oil 1000 Mg Caps (Omega-3 Fatty Acids) .... Take 1 Once Daily 8)  Temazepam 15 Mg Caps (Temazepam) .Marland Kitchen.. 1 By Mouth At Bedtime As Needed 9)  Advair Diskus 250-50 Mcg/dose Aepb (Fluticasone-Salmeterol) .Marland Kitchen.. 1 Puff Two Times A Day As Needed  Allergies (verified): 1)  ! * Codeine 2)  ! Biaxin 3)  ! Percocet  Past History:  Past Medical History: Hypertension COPD    - PFT's 11/15/05  FEV1 46%  ratio 60 with 25% response to B2    - PFT's 01/25/06  FEV1 61%, ratio 46%,  no better after B2    - Add on Advair May 18, 2010 >  iimproved June 29, 2010  Hyperlipidemia Gout Depression CAD - s/p CABG '89, redo '00  Dyslipidemia chronic LBBB diastolic CHF PVD s/p B CEA  MD roster:  pulm - Hardeep Reetz card - tilley  Vital Signs:  Patient profile:   77 year old male Weight:      194 pounds O2 Sat:      96 % on Room air Temp:     98.1 degrees F oral Pulse rate:   63 / minute BP sitting:   110 / 60  (left arm)  Vitals Entered By: Vernie Murders (June 29, 2010  10:26 AM)  O2 Flow:  Room air CC: Dyspnea- the same Comments PT CAME BACK INTO OFFICE WANTS RX FOR ADVAIR AND SPIRIVA S[facility]; [facility]  .Kandice Hams [facility]  June 29, 2010 12:46 PM    Physical Exam  Additional Exam:  wt 197 > 185 June 19, 2008 > 199 June 22, 2009 > 192  May 17, 2010 > 194 June 29, 2010  HEENT mild turbinate edema.  Oropharynx no thrush or excess pnd or cobblestoning.  No JVD or cervical adenopathy. Mild accessory muscle hypertrophy. Trachea midline, nl thryroid. Chest was hyperinflated by percussion with diminished breath sounds and moderate increased exp time without wheeze. Hoover sign positive at mid inspiration. Regular rate and rhythm without murmur gallop or rub or increase P2. No edema. Abd: no hsm, nl excursion. Ext warm without cyanosis or clubbing      CXR  Procedure date:  06/29/2010  Findings:      Comparison: June 19, 2008   Findings: The cardiac silhouette, mediastinum, pulmonary vasculature are within normal limits.  A small nodule in the right lower lobe is suspected.  There is no acute bony abnormality.   IMPRESSION: Question new right lower lobe nodule.  CT scan of the chest is recommended.  Impression & Recommendations:  Problem # 1:  COPD (ICD-496)  GOLD II severity  DDX of  difficult airways managment all start with A and  include Adherence, Ace Inhibitors, Acid Reflux, Active Sinus Disease, Alpha 1 Antitripsin deficiency, Anxiety masquerading as Airways dz,  ABPA,  allergy(esp in young), Aspiration (esp in elderly), Adverse effects of DPI,  Active smokers, plus two Bs  = Bronchiectasis and Beta blocker use..and one C= CHF      Tried  advair last ov > better     ? Acid reflux contributing to symptoms, See instructions for specific recommendations   ? Adverse effect of dpi >  tolerating dpi so far   Problem # 2:  PULMONARY NODULE (ICD-518.89)   cxr's reviewed, not seen on cxr 04/2009 but also not seen on lateral view and ? nipple shadow.  very poor candidate for any form of early intervention so rec f/u 3 months with cxr with nipple markers  Medications Added to Medication List This Visit: 1)  Advair Diskus 250-50 Mcg/dose Aepb (Fluticasone-salmeterol) .Marland Kitchen.. 1 puff two times a day as needed  Other Orders: Est. Patient Level III (99213) T-2 View CXR (71020TC)  Patient Instructions: 1)  Return one year, sooner if needed Prescriptions: ADVAIR DISKUS 250-50 MCG/DOSE AEPB (FLUTICASONE-SALMETEROL) 1 puff two times a day as needed  #3 x 3   Entered by:   Kandice Hams CMA   Authorized by:   Nyoka Cowden MD   Signed by:   Kandice Hams CMA on 06/29/2010   Method used:   Faxed to ...       [facility])       [address] [place], [place]  [address]       Ph: [phone]       Fax:  [phone]   RxID:   [id number] SPIRIVA HANDIHALER 18 MCG  CAPS (TIOTROPIUM BROMIDE MONOHYDRATE) inhale one capsule every day  #3 x 3   Entered by:   Kandice Hams CMA   Authorized by:   Nyoka Cowden MD   Signed by:   Kandice Hams CMA on 06/29/2010   Method used:   Faxed to .[facility] (mail[facility]order)       [address] [place], [place]  [address]       Ph: [phone]       Fax: [phone]   RxID:   [id number] ADVAIR DISKUS 250-50 MCG/DOSE AEPB (FLUTICASONE-SALMETEROL) 1 puff two times a day as needed  #1 x 11   Entered and Authorized by:   Nyoka Cowden MD   Signed by:   Nyoka Cowden MD on 06/29/2010   Method used:   [facility]ronically to        [facility] [phone][facility] (retail)       [address]       [place], [place]  [address]       Ph: [phone] or [phone]       Fax: [phone]   RxID:   [id number]

## 2010-08-30 ENCOUNTER — Telehealth: Payer: Self-pay | Admitting: Internal Medicine

## 2010-08-30 DIAGNOSIS — R911 Solitary pulmonary nodule: Secondary | ICD-10-CM

## 2010-08-30 NOTE — Telephone Encounter (Signed)
Spoke with pt and advised needs followup cxr with nipple markers. Pt verbalized understanding and appt was sched for

## 2010-09-16 ENCOUNTER — Encounter: Payer: Self-pay | Admitting: Internal Medicine

## 2010-09-20 ENCOUNTER — Encounter: Payer: Self-pay | Admitting: Internal Medicine

## 2010-09-20 ENCOUNTER — Ambulatory Visit (INDEPENDENT_AMBULATORY_CARE_PROVIDER_SITE_OTHER): Payer: Medicare Other | Admitting: Internal Medicine

## 2010-09-20 ENCOUNTER — Ambulatory Visit (INDEPENDENT_AMBULATORY_CARE_PROVIDER_SITE_OTHER)
Admission: RE | Admit: 2010-09-20 | Discharge: 2010-09-20 | Disposition: A | Discharge status: Home / Self Care | Payer: Medicare Other | Source: Ambulatory Visit | Attending: Internal Medicine | Admitting: Internal Medicine

## 2010-09-20 VITALS — BP 118/72 | HR 63 | Temp 97.6°F | Ht 70.0 in | Wt 189.0 lb

## 2010-09-20 DIAGNOSIS — J984 Other disorders of lung: Secondary | ICD-10-CM

## 2010-09-20 DIAGNOSIS — J449 Chronic obstructive pulmonary disease, unspecified: Secondary | ICD-10-CM

## 2010-09-20 DIAGNOSIS — R911 Solitary pulmonary nodule: Secondary | ICD-10-CM

## 2010-09-20 MED ORDER — BUDESONIDE-FORMOTEROL FUMARATE 160-4.5 MCG/ACT IN AERO: INHALATION_SPRAY | RESPIRATORY_TRACT | Status: DC

## 2010-09-20 NOTE — Assessment & Plan Note (Signed)
Reviewed cxr with pt:    - Cxr with nipple markers 09/20/2010 s nodules

## 2010-09-20 NOTE — Patient Instructions (Addendum)
Try the symbicort 160 Take 2 puffs first thing in am and then another 2 puffs about 12 hours later.   Work on inhaler technique:  relax and gently blow all the way out then take a nice smooth deep breath back in, triggering the inhaler at same time you start breathing in.  Hold for up to 5 seconds if you can.  Rinse and gargle with water when done   If your mouth or throat starts to bother you,   I suggest you time the inhaler to your dental care and after using the inhaler(s) brush teeth and tongue with a baking soda containing toothpaste and when you rinse this out, gargle with it first to see if this helps your mouth and throat.     If you really like the symbicort (less cough less throat irritation better breathing) then call and will give you a year's supply.  Follow up here is as needed

## 2010-09-20 NOTE — Assessment & Plan Note (Signed)
GOLD II with a sign reversible component better on advair/ spiriva but with worse upper airway symptoms so try symbiocrt 160  The proper method of use, as well as anticipated side effects, of this metered-dose inhaler are discussed and demonstrated to the patient. Improved to 75% p coaching though poor baseline.

## 2010-09-20 NOTE — Progress Notes (Signed)
  Subjective:    Patient ID: Edward [name], male    DOB: 1930/01/19, 77 y.o.   MRN: [id number]  HPI  78  yowm who quit smoking in 1998 with an FEV1 of 61% (GOLD II) recorded in August of 2007 at wt low 190s  maintained on monotherapy with no significant variability between his good and bad days or need for rescue therapy with primatene.   June 19, 2008 ov to refill spiriva using primatene after exerts for faster recovery. no increase doe, cough or tendency to exacerbations. ex tol = > moderate adl's.  rec no change unless rescue need rises  June 22, 2009 Yearly followup. Pt states that his breathing is not limiting him but he is relatively sedentary. rec continue spriva   May 17, 2010 ov cc sob worse since ran out of primatene, apparenlty using this much more than reported at last ov. no purulent sputum.but coughing more now  rec Start Advair 250 / 50 one twice daily and rinse and gargle but cough gets worse ok to stop it  Prilosec before bfast and pepcid 20 mg at bedtime as long as coughing ( reflux is to cough what oxygen is to fire)  GERD (REFLUX) diet  cxr on return   June 29, 2010 ov doe x 150 yeards, does shopping ok, less need for primatene, sleeping ok.   09/20/2010 ov/  Edward [name] cc increase hoarse, throat congestion, thinks it's his allergies. Dry cough, not waking him up at night and better overall ex tol, never needs primatene anymore. Pt denies any significant sore throat, dysphagia, itching, sneezing,  nasal congestion or excess/ purulent secretions,  fever, chills, sweats, unintended wt loss, pleuritic or exertional cp, hempoptysis, orthopnea pnd or leg swelling.    Also denies any obvious fluctuation of symptoms with weather or environmental changes or other aggravating or alleviating factors.        Past Medical History:  Hypertension  COPD  - PFT's 11/15/05 FEV1 46% ratio 60 with 25% response to B2  - PFT's 01/25/06 FEV1 61%, ratio 46%, no better after B2  -  Add on Advair May 18, 2010 > iimproved June 29, 2010  Hyperlipidemia  Gout  Depression  CAD - s/p CABG '89, redo '00  Dyslipidemia  chronic LBBB  diastolic CHF  PVD s/p B CEA  MD roster:  pulm - Edward [name]  card - Edward [name]         Review of Systems     Objective:   Physical Exam    wt 197 > 185 June 19, 2008 >   > 194 June 29, 2010  > 09/20/2010  189  HEENT mild turbinate edema. Oropharynx no thrush or excess pnd or cobblestoning. No JVD or cervical adenopathy. Mild accessory muscle hypertrophy. Trachea midline, nl thryroid. Chest was hyperinflated by percussion with diminished breath sounds and moderate increased exp time without wheeze. Hoover sign positive at mid inspiration. Regular rate and rhythm without murmur gallop or rub or increase P2. No edema. Abd: no hsm, nl excursion. Ext warm without cyanosis or clubbing    cxr 09/20/2010 :  Copd only, no nodules Assessment & Plan:

## 2010-10-12 NOTE — Assessment & Plan Note (Signed)
[facility]                         GASTROENTEROLOGY OFFICE NOTE   Edward [name], Edward [name]                     MRN:          [id number]  DATE:06/07/2007                            DOB:          Sep 28, 1929    CHIEF COMPLAINT:  77 year old white male, self-referred for  evaluation of worsening constipation and hematochezia.   HISTORY OF PRESENT ILLNESS:  Mr. Dipasquale is followed by Dr. Jarome Matin.  He underwent colonoscopy by Dr. Vilinda Boehringer on July 26, 1999 for rectal bleeding.  Internal hemorrhoids were noted and no  abnormalities were uncovered.  He relates worsening problems with  constipation that began about three months ago and he has used Dulcolax  tablets on a frequent basis.  He has had a small amount of bright red  blood per rectum intermittently over the past few months as well.  He  notes no abdominal pain, weight loss or change in stool caliber.  There  is no family history of colon cancer, colon polyps or inflammatory bowel  disease.   PAST MEDICAL HISTORY:  COPD  coronary artery disease  status post CABG in 2002  status post abdominal aortic aneurysm repaired 1998  status post multiple angioplasties  status post carotid endarterectomy  status post cholecystectomy  arthritis  hyperlipidemia  internal hemorrhoids.   CURRENT MEDICATIONS:  Listed on the chart -updated and reviewed.   MEDICATION ALLERGIES:  CODEINE, BIAXIN, PERCOCET.   SOCIAL HISTORY:  Per the handwritten form.   REVIEW OF SYSTEMS:  Per the handwritten form.   PHYSICAL EXAMINATION:  Well-developed, well-nourished white male in no  acute distress.  Height:  5 feet 10 inches.  Weight:  194 pounds.  Blood  pressure:  122/64.  Pulse:  64, regular.  HEENT:  Anicteric sclerae.  Oropharynx clear.  Neck without thyromegaly,  adenopathy appreciated.  CHEST:  Scattered coarse rhonchi bilaterally.  CARDIAC:  Regular rate and rhythm without murmurs.  ABDOMEN:  Soft, nontender, nondistended.  Normoactive bowel sounds.  No  palpable organomegaly or masses.  He has two ventral hernias at his  midline incision, one in the epigastric area and one in the  periumbilical area.  They are easily reducible and nontender.  RECTAL:  Deferred to time of colonoscopy.  EXTREMITIES:  No clubbing, cyanosis or edema.  NEUROLOGIC:  Alert and oriented x3.  Grossly nonfocal.   ASSESSMENT/PLAN:  1. Change in bowel habits with worsening constipation and small volume      hematochezia.  Rule out colorectal neoplasms, inflammatory bowel      disease and hemorrhoids.  Begin MiraLax at t.i.d. to q.i.d. dosing.      He may decrease the dose as needed for adequate bowel movements.      He is to substantially increase his daily fiber and fluid intake.      Risks, benefits and alternatives to colonoscopy, possible biopsy      and possible polypectomy performed off Plavix for seven days.      Discussed with the patient and he consents to proceed.  This will      be scheduled electively.  We will obtain clearance from Dr. Viann Fish for a hold on Plavix with plans to restart Plavix on the day      of his procedure.  2. Small ventral hernias.  Expectant management.     Venita Lick. Russella Dar, MD, [facility]  Electronically Signed    MTS/MedQ  DD: 06/19/2007  DT: 06/19/2007  Job #: [id number]   cc:   Barry Dienes. Eloise Harman, M.D.

## 2010-10-15 NOTE — Assessment & Plan Note (Signed)
[name]                             PULMONARY OFFICE NOTE   NAME:[name], Edward                     MRN:          [id number]  DATE:07/19/2006                            DOB:          [birth year]    HISTORY OF PRESENT ILLNESS:  The patient is a 77 year old white male  patient of Dr. Sherene Sires, who has a known history of significant COPD with an  asthmatic component, that presents for a three-week history of nasal  congestion, productive cough of thick, green sputum.  The patient  complains symptoms have waxed and waned over the last several weeks and  have worsened this week.  The patient has recently been hospitalized at  [facility] for right-shoulder fracture.  He has been seeing Dr.  Carola Frost and had right-shoulder surgery and is now in a shoulder sling.  He  was discharged from the hospital two weeks ago.   PAST MEDICAL HISTORY/CURRENT MEDICATIONS:  Were reviewed with the  patient.  Unfortunately, his chart is unavailable at today's visit.   ALLERGIES:  CODEINE and BIAXIN.   PHYSICAL EXAM:  The patient is a pleasant male, in no acute distress.  He is afebrile with stable vital signs.  His O2 saturation is 98% on  room air.  HEENT:  Nasal mucosa is erythematous.  Nontender sinuses.  Posterior  pharynx is clear.  NECK:  The neck is supple without adenopathy.  LUNGS:  Lung sounds reveal some coarse rhonchi bilaterally.  CARDIAC:  Regular rate and rhythm.  ABDOMEN:  Soft and nontender.  EXTREMITIES:  Warm without any calf cyanosis or clubbing.  There is  trace edema.   IMPRESSION AND PLAN:  Acute tracheobronchitis.  The patient is to get  Omnicef times seven days.  Add Mucinex-DM twice daily.  The patient will  return back with Dr. Sherene Sires in three to four weeks or sooner, if needed.      Rubye Oaks, NP  Electronically Signed      Charlaine Dalton. Sherene Sires, MD, [facility]  Electronically Signed   TP/MedQ  DD: 07/19/2006  DT: 07/19/2006  Job #:  [id number]

## 2010-10-15 NOTE — Op Note (Signed)
NAME:  Edward [name], Edward [name]              ACCOUNT NO.:  [id number]   MEDICAL RECORD NO.:  [id number]          PATIENT TYPE:  INP   LOCATION:  3734                         FACILITY:  [facility]   PHYSICIAN:  Doralee Albino. Carola Frost, M.D. DATE OF BIRTH:  08/05/1929   DATE OF PROCEDURE:  09/04/2006  DATE OF DISCHARGE:                               OPERATIVE REPORT   PREOPERATIVE DIAGNOSIS:  Right proximal humerus greater tuberosity  nonunion/ complete rotator cuff tear.   POSTOPERATIVE DIAGNOSIS:  Right proximal humerus greater tuberosity  nonunion/complete rotator cuff tear.   PROCEDURES:  1. Repair of complete rotator cuff tear (including greater tuberosity      nonunion)  2. Subacromial decompression.   SURGEON:  Myrene Galas, M.D.   ASSISTANT:  None.   ANESTHESIA:  General supplemented with a regional block.   SPECIMENS:  None.   ESTIMATED BLOOD LOSS:  200 cc.   COMPLICATIONS:  None.   DISPOSITION:  To PACU condition stable.   BRIEF SUMMARY OF INDICATIONS FOR PROCEDURE:  Geovonni Meyerhoff is a 77-year-  old male with a four-part fracture dislocation of the right proximal  humerus treated almost two months ago with proximal humerus  hemiarthroplasty.  The patient did have some medical issues in the  postoperative period when he was 10-20 days out and presented for a  first follow-up with partial loss of reduction.  There is a large  greater tuberosity fragment that had been repaired and part of that  repair remained intact and piece of the bone appeared to have avulsed.  We were hopeful that the attached segment was small and that he could  once allowed to begin active rehab, regain sufficient control of the  shoulder, that he would not require revision repair.  Once this was  feasible he did not progress and consequently, I recommended repair of  his cuff and the tuberosity fragment.  The patient understood the risks  and benefits of surgery including possibility of heart attack,  stroke,  infection, nerve injury, vessel injury, loss of reduction or disruption  of the repair and other concerns.  After full discussion he wished to  proceed.   DESCRIPTION OF PROCEDURE:  Mr. Sangiovanni was taken to the operating room  where general anesthesia was induced.  He did receive a preoperative  block.  We placed a splint on his right wrist, given his history of  severe wrist pain following a partial carpal excision.  We then reopened  his anterolateral deltopectoral approach and identified the interval  between the pect and the deltoid, retracting the vessel laterally.  We  then encountered the lesser tuberosity fragment with the anterior cuff  attachment that was no longer connected with the greater tuberosity  fragment which in part seemed to have melted and was not to be found.  We then went proximally up over the humeral head and were able to use  ONEOK type sutures to control the cuff and this fragment.  There  was a significant portion of posterior cuff involvement.  We made a  supplemental deltoid splitting incision and were careful to limit its  distal extent to avoid any injury to the axillary nerve and also were  careful with our retraction.  We mobilized this segment and were able to  repair it to the posterior fin of the prosthesis using a ONEOK #2  FiberWire.  Similarly we tried some of these to the middle fin and  augmented the repair of the lesser tuberosity by passing it through the  fins as well.  In short it appeared that his cuff muscles, with the  seeming necrosis of the bone and subsequent loss of reduction, had  dissociated from the prosthesis.  Some of the old suture material which  had been used to repair the fragments directly back to the shaft around  the tuberosity were removed.  The subacromial space was narrow and  potential source of impingement, and consequently a formal decompression  was performed to redcue the risk of loss of our  revision cuff repair.  We copiously irrigated and then placed some DBX graft around the  tuberosity bone segments in order to improve the chances of some bony  ingrowth and stable fixation to the prosthesis.  We then performed a  simple layered closure with #1-0 Vicryl for the deep layer, 0-0 Vicryl  to reapproximate the deltopectoral interval 2-0 Vicryl and staples for  the skin.  Sterile gently compressive dressing was applied.  We did also  place nystatin in his axilla as he had developed a significant and  pretty severe rash.  He was placed in adduction sling and taken to PACU  in stable condition.   PROGNOSIS:  Mr. Garlitz has had a significant injury to his shoulder with  now revision fixation of his cuff.  As such he is at increased risk for  complications including loss of this repair.  He will need to be  compliant with his strict and conservative rehab protocol.  This should  allow for healing and then resumption of range of motion.  He will be  watched on telemetry bed and we will contact Dr. Donnie Aho if he has any  signs of cardiac difficulty.      Doralee Albino. Carola Frost, M.D.  Electronically Signed     MHH/MEDQ  D:  09/04/2006  T:  09/05/2006  Job:  [id number]

## 2010-10-15 NOTE — Assessment & Plan Note (Signed)
 HEALTHCARE                             PULMONARY OFFICE NOTE   NAME:Edward [name], Edward [name]                     MRN:          [id number]  DATE:04/27/2006                            DOB:          05/03/1930    A 77 year old white male initially felt to have significant COPD with an  FEV1 of 61% predicted and a possible asthmatic component that seemed to  resolve off of ACE inhibitors.  He returns today complaining with mild  hoarseness on a combination of QVAR and Spiriva but denies any problems  walking around the mall for 25 minutes a day at a fairly good pace.  He denies any exertional chest pain, orthopnea, PND or leg swelling.  No  variability with seasons or weather changes.   PHYSICAL EXAMINATION:  He is a slightly hoarse, ambulatory white male in  no acute distress with stable vital signs, with a blood pressure of  138/72.  HEENT:  Unremarkable.  OROPHARYNX:  Clear.  NECK:  Supple without cervical adenopathy or tenderness.  TRACHEA:  Is midline, no thyromegaly.  LUNG FIELDS:  Reveal diminished breath sounds but no wheezing.  Regular  rhythm without murmur, gallop or rub.  ABDOMEN:  Soft, obese but benign.  EXTREMITIES:  Warm without calf tenderness, cyanosis, clubbing, edema.   IMPRESSION:  This patient clearly has evidence of airflow obstruction  that is moderate in nature but does not limit him while taking Spiriva  daily.  I believe the asthmatic component of the problem was probably  more pseudoasthma than true asthma. I recommended that he stop QVAR at  this point to see if he notices any increased symptoms of cough, wheeze  or variability in terms of his activity tolerance.  If not, Spiriva  monotherapy is probably the best choice.   He plans to get Dr. Brunilda Payor to be his primary care doctor, since  this is the doctor that sees his wife, and so we can see him henceforth  on a p.r.n. basis.     Charlaine Dalton. Sherene Sires, MD, [facility]  Electronically Signed    MBW/MedQ  DD: 04/27/2006  DT: 04/28/2006  Job #: [id number]   cc:   Georga Hacking, M.D.  Barry Dienes Eloise Harman, M.D.

## 2010-10-15 NOTE — H&P (Signed)
NAME:  HOYTE, ZIEBELL NO.:  [id number]   MEDICAL RECORD NO.:  [id number]          PATIENT TYPE:  EMS   LOCATION:  MAJO                         FACILITY:  [facility]   PHYSICIAN:  Ulyses Amor, MD DATE OF BIRTH:  11-09-1929   DATE OF ADMISSION:  06/26/2006  DATE OF DISCHARGE:                              HISTORY & PHYSICAL   Edward [name] is a 77 year old white man who is admitted to [facility] after suffering a fracture and dislocation of his right  shoulder.  The patient apparently missed a step at home and fell face  forward down the steps.  EMS was summoned and he was transported to the  emergency department.  For a period of time, he experienced difficulty  breathing and his oxygen saturations dropped to 90% on room air.  He was  put on a nonrebreather mask and transported to the emergency department.  Since being in the emergency department, his respiratory distress has  resolved.  His oxygen saturations are now greater than 95% on 2 liters  of oxygen via nasal cannula.  The patient reports slight dyspnea at best  compared to his baseline.   The patient has a history of coronary artery disease.  He has previously  undergone two coronary artery bypass operations.  He has a history of  congestive heart failure.  He also has hypertension and dyslipidemia.  There is no history of cardiac arrhythmia.   His past medical history is also notable for a repaired abdominal aortic  aneurysm.   The patient reports no dizziness, lightheadedness, syncope or near  syncope prior to or in association with his fall.  He reports no chest  pain, tightness, heaviness, pressure or squeezing.   MEDICATIONS:  1. Plavix 75 mg p.o. daily.  2. Zetia 10 mg p.o. daily.  3. Toprol XL dose unknown.  4. Crestor dose unknown.   ALLERGIES:  CODEINE.   Coronary artery bypass surgery on two occasions, repair of abdominal  aortic aneurysm.   SOCIAL HISTORY:  The patient  is retired.  He lives at home with his  wife.  He discontinued smoking at the time of his abdominal aortic  aneurysm.  He does not drink alcohol.   FAMILY HISTORY:  His father died of leukemia.  His mother died of a  heart attack.  He has a brother who is alive and well.  He has a sister  who died a traumatic death.   REVIEW OF SYSTEMS:  Reveals no new problems related to his head, eyes,  ears, nose, mouth, throat, lungs, gastrointestinal system, genitourinary  system or extremities.  There was no history of neurologic or  psychiatric disorder.  There was no history of fever, chills or weight  loss.   PHYSICAL EXAMINATION:  Blood pressure 102/77, pulse 58 and regular,  respirations 18, temperature 97.  The patient was an elderly white man  in no discomfort.  He was alert, oriented, appropriate and responsive.  Head, eyes, nose and mouth were normal.  The neck was without  thyromegaly or adenopathy.  Carotid pulses were palpable bilaterally  and  without bruits.  Cardiac examination revealed a normal S1-S2.  There was  no S3, S4, murmur, rub or click.  Cardiac rhythm was regular.  No chest  wall tenderness was noted.  Examination of the lungs revealed a rare  basilar crackle.  The abdomen was soft and nontender.  There was no  mass, hepatosplenomegaly, bruit, distention, rebound, guarding or  rigidity.  Bowel sounds were normal.  Rectal and genital examinations  were not performed as they were not pertinent to the reason for acute  care hospitalization.  The extremities were without edema.  The right  upper arm was not examined due to the fracture and it was currently  being evaluated by orthopedics.  Radial and dorsalis pedal pulses were  palpable bilaterally.  Brief screening neurologic survey was  unremarkable.   An electrocardiogram has not yet been performed.  The chest radiograph  demonstrated mild cardiomegaly and interstitial edema consistent with  mild congestive heart  failure.  There was a possible small right pleural  effusion.  Right shoulder radiograph demonstrated a comminuted fracture  involving the humeral head with associated anterior glenohumeral  dislocation.  Initial set of cardiac markers revealed a myoglobin of  greater than 500, CK-MB 2.9 and troponin less than 0.05.  Fibrin  derivatives were 4.71.  BNP was 556.  White count was 14.4 with a  hemoglobin of 16.1 and hematocrit of 48.6.  Potassium was 4.2, BUN 22  and creatinine 1.5.  The remaining studies were pending at the time of  this dictation.   IMPRESSION:  1. Right shoulder fracture and dislocation.  2. Mild congestive heart failure and history of congestive heart      failure.  Oxygen saturations now are greater than 95% on two liters      of oxygen via nasal cannula.  3. Coronary artery disease, status post two coronary artery bypass      operations.  4. Hypertension.  5. Dyslipidemia.   PLAN:  1. Telemetry.  2. Serial cardiac enzymes.  3. Oxygen.  4. Furosemide.  5. Orthopedic consultation.  6. Obtain microfilm hospital records.  7. Further measures per Dr. Donnie Aho.      Ulyses Amor, MD  Electronically Signed     MSC/MEDQ  D:  06/27/2006  T:  06/27/2006  Job:  [id number]   cc:   Georga Hacking, M.D.

## 2010-10-15 NOTE — Op Note (Signed)
NAME:  Edward [name], Edward [name]              ACCOUNT NO.:  [id number]   MEDICAL RECORD NO.:  [id number]          PATIENT TYPE:  INP   LOCATION:  6524                         FACILITY:  [facility]   PHYSICIAN:  Doralee Albino. Carola Frost, M.D. DATE OF BIRTH:  [birth year]   DATE OF PROCEDURE:  06/27/2006  DATE OF DISCHARGE:                               OPERATIVE REPORT   PREOPERATIVE DIAGNOSIS:  Right proximal humerus four-part fracture  dislocation.   POSTOPERATIVE DIAGNOSIS:  Right proximal humerus four-part fracture  dislocation.   PROCEDURE:  Right shoulder hemiarthroplasty using a DePuy global  fracture prosthesis, size 10-mm cemented stem, 18 x 38-mm head.   SURGEON:  Doralee Albino. Carola Frost, M.D.   ASSISTANT:  None.   ANESTHESIA:  General.   SPECIMENS:  None.   ESTIMATED BLOOD LOSS:  150 mL.   DISPOSITION:  To PACU.   CONDITION:  Stable.   BRIEF SUMMARY AND INDICATIONS FOR PROCEDURE:  Edward [name] is a 76-year-  old male who sustained a comminuted right shoulder fracture dislocation  yesterday.  He underwent attempt at closed reduction without success and  understood clearly prior to reduction possibility of failure to  immobilize the head into the glenoid, given the fracture.  The patient  then underwent cardiac evaluation by his physician, Dr. Viann Fish,  who subsequently cleared him for surgery after evaluating him further  with a 2-D echo.  With regard to surgery, we discussed the likelihood of  hemiarthroplasty and he knew the risks including infection, nerve  injury, vessel injury, loosening, decreased range of motion, pain, DVT,  PE and need for further surgery among others.  The patient and his wife  wished to proceed.   BRIEF DESCRIPTION OF PROCEDURE:  Edward [name] was taken to the operating  room, where general anesthesia was induced.  His right upper extremity  was then prepped and draped in the usual sterile fashion with a bump  under the shoulder blade lying supine on the  radiolucent table.  Standard deltopectoral approach was then made to the proximal humerus.  There was a significant hemorrhage within the tissues, most likely  secondary to the patient's Plavix, in addition to his fracture.  We were  able to identify the cephalic vein and retract it laterally with the  deltoid, develop the deltopectoral groove and split the clavipectoral  fascia, revealing the absent glenoid below.  The head was located  anteriorly and inferiorly and required removal with a Kocher clamp.  There was absolutely no soft tissue attachment to the portion of the  head, which was essentially through the anatomic neck.  Given that this  was not suitable for osteosynthesis, decision was made to proceed with  hemiarthroplasty.  The lesser tuberosity remained attached the  subscapularis the patient's rotator cuff actually appeared to be  healthy.  The greater and lesser tuberosity segments were captured with  #2 FiberWire, passing these through both bone and cuff.  Some of the  bone was thinned down with a rongeur to facilitate bringing the  tuberosities together over the prosthesis.  The rotator cuff interval  was split with the scissor  and tagged with a FiberWire for later repair.  We then delivered the shaft and prepared it proximally.  I reamed it  sequentially up to 10 and then used the pressure lavage and pressure  application of cement after placing a cement restrictor to prepare the  bed for the prosthesis.  Prior placement of the cement restrictor, we  did place and trial a 10 as well as the 38 x 18-mm head.  This had good  fit and translation and consequently was selected.  We were able to use  the bicipital groove to control our rotation.  The prosthesis was seated  appropriately, the head inserted and we removed excess cement.  We then  placed bone graft beneath the repaired tuberosities.  We began the  repair of the rotator cuff interval with the #2 FiberWire and some  of  the tuberosity was repaired directly to the humeral shaft through bone  tunnels and the tuberosities were also repaired to each other; this  resulted in excellent construct which was stable and moved as a unit  with restoration of the integrity of the cuff.  It also felt unstable  without being over-tight and the patient could externally rotate just  under 30 degrees.  The wound was copiously irrigated and then closed in  standard layered fashion.  Unfortunately, during closure, in spite of  protecting and retracting the cephalic vein, it did appear to have some  tearing and consequently was ligated, as this was not repairable.  A  standard layered closure was performed with 0 and 2-0 Vicryl and then  staples for the skin.  A sterile gently compressive dressing was applied  and then a sling.  The patient was wakened from anesthesia and  transported to the PACU in stable condition.   PROGNOSIS:  Edward [name] has had a severe injury to his right shoulder and  he has multiple medical co-morbidities.  These are going to make it  difficult for him to recover his preoperative function.  Also, the  results with this injury are  usually marked by unrestricted motion and  strength, but good pain control.  Consequently, we anticipate him  realizing that outcome.  He will resume his Plavix as soon as Dr. Donnie Aho  believes it is appropriate.  We can certainly start of Lovenox tomorrow  and restart all of his other cardiac medications.  He can have  mechanical prophylaxis for DVT while he is in the hospital with foot  pumps in addition to Lovenox and then should not require any further  after discharge.  He will begin pendulum range of motion, but no active  motion and till 6 weeks, other than at the elbow and wrist and hand,  which does need to be continued to prevent stiffness.      Doralee Albino. Carola Frost, M.D.  Electronically Signed     MHH/MEDQ  D:  06/27/2006  T:  06/28/2006  Job:  [id number]

## 2010-10-15 NOTE — Assessment & Plan Note (Signed)
 HEALTHCARE                               PULMONARY OFFICE NOTE   KARAN, RAMNAUTH                     MRN:          [id number]  DATE:12/14/2005                            DOB:          12/27/1929    HISTORY:  The patient is a 77 year old white male seen on November 15, 2005 with  the impression of classic vocal cord dysfunction, much better with  elimination of ACE inhibitors and recommended a trial of Advair 115/21 two  puffs b.i.d. after documenting that he could indeed handle the MDI  technique.   He returns today feeling that he is breathing better than ever.   MEDICATIONS:  Medications were reviewed with the patient in detail  __________ December 14, 2005, noting that he continues to use Advair but has  stopped all of his anti-hypertensives at this point.   PHYSICAL EXAMINATION:  VITAL SIGNS:  Afebrile.  Blood pressure 138/78.  HEENT:  Oropharynx clear.  LUNGS:  Lung fields reveal classic pseudo wheeze only.  These are mild,  however.  CARDIOVASCULAR:  Regular rhythm without murmurs, rubs, or gallops.  ABDOMEN:  Soft and benign.  EXTREMITIES:  Warm and without calf tenderness, cyanosis, or clubbing.   LABORATORY DATA:  PFTs were reviewed with the patient from November 15, 2005 and  show a significant improvement after bronchodilators.   IMPRESSION:  This patient appears to have asthma with large upper airway  component symptomatically that is probably being exacerbated and aggravated  by lisinopril and masquerading as poor asthma control.  He is doing so  much better.  For now, I am reluctant to make any changes, but note that he  does have still mild pseudo wheeze that may be either due to the topical  effects with Advair on the upper airway or the residual angiotension-  converting enzyme inhibitor effects.   To sort through the differential, I have arranged to see the patient back in  six weeks and would consider switching to Qvar on his  next visit if he  continues to do so well symptomatically (since this has the least effect on  the upper airway of all of the available metered dose inhalers).                                   Charlaine Dalton. Sherene Sires, MD, [facility]   MBW/MedQ  DD:  12/15/2005  DT:  12/16/2005  Job #:  [id number]   cc:   Georga Hacking, MD

## 2010-10-15 NOTE — Assessment & Plan Note (Signed)
[facility]                               PULMONARY OFFICE NOTE   NAME:CROTTSLexx, Monte                     MRN:          [id number]  DATE:02/15/2006                            DOB:          1929/08/15    HISTORY OF PRESENT ILLNESS:  A 77 year old white male last seen August 29  with evidence of emphysematous COPD with a possible asthmatic component  (versus pseudoasthma related to previous ACE inhibitor exposure) presently  maintained on a combination of Spiriva two puffs q.a.m. and Qvar 80 two  puffs b.i.d. (feeling the best he has felt).  He admits, however, he is not  particularly active, but cannot identify a single activity where dyspnea  presently limits him.  He denies also any nocturnal wheezing, fevers,  chills, sweats, cough, chest pain or leg swelling.   MEDICATIONS:  For full inventory of medications, please see column dated  February 15, 2006.   PHYSICAL EXAMINATION:  GENERAL:  He is a pleasant, ambulatory pot-belly  while male in no acute distress.  VITAL SIGNS:  Stable. Blood pressure only 128/64 on Toprol XL 100 mg one  daily.  HEENT:  Unremarkable.  Pharynx is clear.  LUNGS:  Lung fields revealed diminished breath sounds bilaterally.  No  wheezing.  HEART:  Regular rhythm without murmurs, gallops, rubs.  ABDOMEN:  Soft, benign.  EXTREMITIES:  Warm without calf tenderness, cyanosis, clubbing or edema.   STUDIES:  PFT's were reviewed from August 29 that indicate severe airflow  obstruction with no significant reversible component whereas previous PFT's  performed November 15, 2005, showed a 25% improvement after bronchodilators.   IMPRESSION:  Chronic obstructive pulmonary disease with no active asthmatic  component.  In this setting, it may be possible to taper Qvar off if he does  not have frequent exacerbations or nocturnal wheeze or variable in terms of  his dyspnea.  I told him it was very important, however, to establish a  baseline in terms of activity tolerance before he attempts to taper the  Qvar off.   FOLLOWUP:  Follow up in 6-8 weeks.   ADDENDUM:  If there does appear to be a significant asthmatic component, I  would prefer the patient to be on a more specific beta blocker such as  Bisoprolol in lower doses and Toprol XL  100 which has significant spillover beta 2 effect.  May have significant  beta 2 effect at high doses.  However, I am going to defer this issue to Dr.  York Spaniel judgment.                                   Charlaine Dalton. Sherene Sires, MD, [facility]   MBW/MedQ  DD:  02/15/2006  DT:  02/17/2006  Job #:  [id number]   cc:   Georga Hacking, M.D.

## 2010-10-15 NOTE — Op Note (Signed)
NAME:  Edward, [name] NO.:  [id number]   MEDICAL RECORD NO.:  [id number]          PATIENT TYPE:  INP   LOCATION:  1823                         FACILITY:  [facility]   PHYSICIAN:  Doralee Albino. Carola Frost, M.D. DATE OF BIRTH:  December 16, 1929   DATE OF PROCEDURE:  06/27/2006  DATE OF DISCHARGE:                               OPERATIVE REPORT   PREOPERATIVE DIAGNOSIS:  Right proximal humerus fracture dislocation.   POSTOPERATIVE DIAGNOSIS:  Right proximal humerus fracture dislocation.   PROCEDURE:  Closed reduction of right shoulder.   SURGEON:  Myrene Galas, M.D.   ANESTHESIA:  Administered by Dr. Cheri Guppy, using etomidate.   COMPLICATIONS:  None.   BRIEF SUMMARY:  Complete skeletal relaxation was obtained in order to  reduce any muscle resistance to reduction.  Longitudinal traction was  applied steadily but gently and also manual manipulation of the head of  the proximal humerus.  The shaft was able to be realigned however, the  fullness anterior to the glenoid did not completely reduce and was  concerning for persistent dislocation.  Postreduction x-rays were then  obtained which showed realignment of the shaft and tuberosity, but  persistent dislocation of the humeral head.   ASSESSMENT:  Persistent a humeral head dislocation with realignment of  the shaft and tuberosities.   PLAN:  I have discuss this result with Dr. Waldon Reining, who is the attending  cardiologist.  Given that the postreduction physical examination  remained unchanged with no sensory or motor deficits and consequently no  overwhelming emergent need to proceed, Dr. Waldon Reining felt that dealing  with this on a urgent basis the next 24 hours may be more advisable to  make sure that the patient indeed has not had a coronary event and that  his edema continues to abate with Lasix and other modalities.  He will  be on a telemetry bed and will plan for surgery once Dr. Donnie Aho, who is  the primary  cardiologist, clears him for same.  The patient will likely  require hemiarthroplasty but if there is sufficient bone for fixation,  we can consider internal fixation as well.      Doralee Albino. Carola Frost, M.D.  Electronically Signed     MHH/MEDQ  D:  06/27/2006  T:  06/27/2006  Job:  [id number]

## 2010-10-15 NOTE — Procedures (Signed)
[facility]  Patient:    IZAAC, REISIG                      MRN: [id number] Proc. Date: 07/26/99 Adm. Date:  [id number] Attending:  Orland Mustard CC:         Lorne Skeens. Hoxworth, M.D.             [name]., M.D.                           Procedure Report  PROCEDURE:  Colonoscopy.  MEDICATIONS:  Fentanyl 90 mcg, Versed 8 mg IV.  INDICATIONS:  Rectal bleeding.  DESCRIPTION OF PROCEDURE:  The procedure had been explained to the patient and consent obtained.  With the patient in the left lateral decubitus position, the  adult Olympus video colonoscope was inserted and advanced under direct visualization.  The prep was excellent and we were able to advance to the cecum  without difficulty.  The scope was withdrawn and the cecum, ascending colon, hepatic flexure, transverse colon, splenic flexure, descending, and sigmoid colon were seen well.  No polyps, diverticuli, or other lesions were seen.  Internal hemorrhoids were seen in the rectum.  The scope was withdrawn.  Internal hemorrhoids were seen in the anal canal with some thickening, but no gross fissure. The patient tolerated the procedure well and was maintained on low flow oxygen nd pulse oximetry throughout the procedure with no obvious problems.  ASSESSMENT:  Rectal bleeding - this is probable due to internal hemorrhoids.  PLAN:  Will start on high fiber diet and will plan on seeing back in the office in six to eight weeks. DD:  07/26/99 TD:  07/26/99 Job: [id number]

## 2010-10-15 NOTE — Discharge Summary (Signed)
NAME:  Edward [name], Edward [name] NO.:  [id number]   MEDICAL RECORD NO.:  [id number]          PATIENT TYPE:  INP   LOCATION:  6524                         FACILITY:  [facility]   PHYSICIAN:  Georga Hacking, M.D.DATE OF BIRTH:  [birth year]   DATE OF ADMISSION:  06/26/2006  DATE OF DISCHARGE:  06/28/2006                               DISCHARGE SUMMARY   FINAL DIAGNOSES:  1. Acute fracture, dislocation of the right shoulder, surgically      repaired on 06/27/2006 following failure of closed reduction.  2. Congestive heart failure, acute diastolic with normal systolic      function.  3. Coronary artery disease with previous redo bypass grafting.  4. Hyperlipidemia.  5. Hypertension.  6. Chronic obstructive pulmonary disease.  7. New onset of left bundle branch block.   PROCEDURES:  Operative reduction and internal fixation of right shoulder  with hemiarthroplasty.   CONSULTATIONS:  Dr. Myrene Galas.   HISTORY:  A 77 year old male was walking up steps at home, missed a step  and fell, face forward, down the steps.  EMS was summoned and he was  transported to the emergency room.  He had some difficulty breathing and  had somewhat reduced oxygen saturations in route. He has no chest pain  at any time.  He had recently been treated with Biaxin for an upper  respiratory infection.  He has a known history of coronary artery  disease with previous bypass grafting.  He was admitted for treatment of  possible congestive heart failure as well as new onset of left bundle  branch block and treatment of his shoulder problem.  Please see the  previously dictated history and physical for remainder of the details.   HOSPITAL COURSE:  On admission, sodium is 140, potassium is 3.5,  chloride is 110, CO2 is 21, glucose 132, BUN 20, creatinine 1.18.  Liver  enzymes were normal.  CPK was elevated at 571 with an MB of 6.7,  troponin was 0.05.  CBC showed a hemoglobin of 14.3 and hematocrit of  41.9.  Fibrin derivatives were 4.71.  Magnesium level is 1.6.  EKG  showed a new onset of a left bundle branch block pattern. Initial ECG  showed right axis deviation.  The patient was given a single dose of  Lasix because of an elevated BNP of 560.  His breathing improved and his  oxygen was able to be discontinued.  He was seen in consultation by the  orthopedic surgeon who found him to have a right proximal humerus  dislocated fracture. Closed reduction was attempted but was unsuccessful  with persistent dislocation of the head.  He was seen the next morning  and had an echocardiogram that showed paradoxical septal motion  compatible with a bundle branch block but preserved systolic function  and left ventricular hypertrophy.  He was given clearance to proceed  with surgery.  He tolerated the operating room well and was stable the  next morning, to follow up physical therapy and occupational therapy as  an outpatient.  He will be seen by Dr. Carola Frost in 10 days for followup.  He was given discharge instructions related to the care of his wound.   CONDITION ON DISCHARGE:  He is discharged at this time in improved  condition.   DISCHARGE MEDICATIONS:  1. Plavix 75 mg daily.  2. Toprol XL 100 mg daily.  3. Crestor 10 mg daily.  4. Zetia 10 mg daily.  5. Plavix 75 mg daily.  6. Disopyramide 150 mg 3 times a day.  7. Temazepam 15 mg every h.s. as needed for sleep.  8. Percocet 1-2 every 4 hours as needed for pain.   DISCHARGE INSTRUCTIONS:  1. He was given discharge instructions related to the care of his      shoulder.  2. He will see Dr. Donnie Aho in followup in 2 weeks.  3. We will consider discontinuation of Disopyramide in the future.      Georga Hacking, M.D.  Electronically Signed     WST/MEDQ  D:  06/28/2006  T:  06/28/2006  Job:  [id number]   cc:   Doralee Albino. Carola Frost, M.D.

## 2010-10-15 NOTE — Discharge Summary (Signed)
NAME:  Edward [name], Edward [name]              ACCOUNT NO.:  [id number]   MEDICAL RECORD NO.:  [id number]          PATIENT TYPE:  INP   LOCATION:  6706                         FACILITY:  [facility]   PHYSICIAN:  Lonia Blood, M.D.      DATE OF BIRTH:  [birth year]   DATE OF ADMISSION:  06/29/2006  DATE OF DISCHARGE:  07/03/2006                               DISCHARGE SUMMARY   PRIMARY CARE PHYSICIAN:  The patient is unassigned to one.   DISCHARGE DIAGNOSES:  1. Diastolic dysfunction.  2. Congestive heart failure exacerbation.  3. Altered mental status.  4. Left bundle branch block.  5. Fever transiently.  6. Acute renal failure.  7. Left shoulder fracture and dislocation status post recent repair.  8. Coronary artery disease status post cardiac stent x2 with the last      one in 1992.  9. Abdominal aortic aneurysm status post repair in 1998.  10.Dehydration.  11.Dyslipidemia.  12.Chronic obstructive pulmonary disease.  13.Acute blood loss anemia history.  14.History of fall prior to arrival.   DISCHARGE MEDICATIONS:  Include:  1. Plavix 75 mg daily.  2. Toprol-XL 100 mg daily.  3. Zetia 10 mg daily.  4. Crestor 10 mg daily.   DISPOSITION:  The patient was discharged home with home health PT.  He  was instructed not to take disopyramide. He is to follow up with Dr.  Donnie Aho within a week and he was to be called with an appointment.   PROCEDURE PERFORMED:  Include:  1. Right hand x-ray showed no definite acute bony abnormalities.      Severe degenerative changes in the radiocarpal joint.  2. A CT head without contrast on June 29, 2006, showed no      intracranial hemorrhage.  No acute infarct.  Prior areas of      ischemia that were unchanged.  Significant intracranial changes.  3. Chest x-ray on January 31st, showed no infiltrates or congestive      heart failure.  4. MRI of the brain on January 31st, showed no acute intracranial      abnormality.  Occluded distal right internal carotid  artery seemsn      to be at the level of the opthalmic and is likely longstanding.      Remote encephalomalacia on the right.  5. Renal ultrasound on February 1st showed mild right renal cortical      thinning.  No hydronephrosis.  Bladder is decompressed by a Foley      catheter.  6. Chest x-ray on February 2nd, showed a small bilateral pleural      effusion with distal atelectasis infiltrate.  Also postop changes.  7. VQ scan on February 2nd, showed low likelihood ratio for pulmonary      embolism.   CONSULTATIONS:  1. Orthopedic surgery, Myrene Galas, M.D.  2. Cardiology, Georga Hacking, M.D.   Please refer to discharge summary by Dr. Donnie Aho for further information.      Lonia Blood, M.D.  Electronically Signed     LG/MEDQ  D:  08/16/2006  T:  08/16/2006  Job:  [id number]

## 2010-10-15 NOTE — Assessment & Plan Note (Signed)
[facility]                               PULMONARY OFFICE NOTE   NAME:Edward [name], Edward [name]                     MRN:          [id number]  DATE:01/25/2006                            DOB:          April 18, 1930    HISTORY:  This 77 year old white male last seen on July 18 with evidence of  asthma with a significant upper airway dysfunction attributed to possible  lisinopril side effect, masquerading as poor asthma control, doing much  better on return today on Advair 150/21 two puffs b.i.d. over a baseline.  Evidently he continues to be short of breath with exertion and would like to  be able to do more than just walk for five minutes at a time before giving  out.  He denies any variability of his symptoms or associated cough,  choking, which was previously __________ excess sputum production in the  morning or nocturnal respiratory complaints, fever, chills, sweats,  orthopnea, PND, or leg swelling.   For full list of medications, please see face sheet dated January 25, 2006.   PHYSICAL EXAMINATION:  GENERAL:  He is a pleasant, ambulatory, obese, white  male in no acute distress with a pot belly.  VITAL SIGNS:  Stable.  HEENT:  Unremarkable.  Oropharynx is clear.  CHEST:  Lung fields reveal diminished breath sounds bilaterally.  No  wheezing.  HEART:  Regular rhythm without murmur, gallop or rub.  ABDOMEN:  Obese but otherwise soft, benign.  EXTREMITIES:  Warm without calf tenderness, cyanosis or clubbing or edema.   Spirometry reveals marked improvement in FEV1 up to 1.71 now but no further  improvement after bronchodilators. Ratio is still 43.   IMPRESSION:  This patient has significant residual, emphysematous chronic  obstructive pulmonary disease, that is most likely placing a ceiling in  terms of exercise __________ ventilation.  In addition, he appears to have  significant asthmatic component as evidenced by the fact that his baseline  FEV1 is so  much higher on Advair than it was prior to initiating therapy  (1.3 at baseline versus 1.71 now).   To take advantage of this fact, I am going to recommend treating the  asthmatic component like I would as an asthmatic with Qvar 80 two puffs  b.i.d.  I asked him to stop his Advair and use Spiriva, treating it like  emphysema.  (I would not have done this but he was still convinced he  needed to improve his activity tolerance). And Spiriva and Advair in  combination are so expensive.   I would like to see him back in three weeks to see to what extent he  improves his best day performance and if there is no significant change, I  would favor just using mono therapy with Advair.                                   Charlaine Dalton. Sherene Sires, MD, [facility]   MBW/MedQ  DD:  01/25/2006  DT:  01/26/2006  Job #:  [id number]

## 2010-10-15 NOTE — H&P (Signed)
NAME:  Edward [name], RASH NO.:  [id number]   MEDICAL RECORD NO.:  [id number]          PATIENT TYPE:  INP   LOCATION:  1831                         FACILITY:  [facility]   PHYSICIAN:  Lonia Blood, M.D.       DATE OF BIRTH:  [birth year]   DATE OF ADMISSION:  06/29/2006  DATE OF DISCHARGE:                              HISTORY & PHYSICAL   The patient's primary care physician is no-one.   CHIEF COMPLAINT:  Altered mental status.   HISTORY OF PRESENT ILLNESS:  [name] is a 77 year old gentleman with  past medical history of coronary artery disease and coronary artery  bypass grafting as well as some mild COPD who had a major fall on  June 26, 2006.  After the fall, he was brought to [facility]  where he was found to be hypoxic.  The patient's hypoxia resolved in the  emergency room and then the admission concentrated around the fact that  the patient had a fracture/dislocation of the right shoulder.  The  patient was admitted by Dr. Donnie Aho, who is the patient's cardiologist,  and the patient had a hemiarthroplasty of the right shoulder by Dr.  Carola Frost on November 26, 2006.  Postoperatively, the patient remained hypoxic  and confused but he was discharged home on June 28, 2006.  The  patient was taken home by his wife who reports that he remained confused  with trouble speaking at times and unable to get out of bed because of  the weakness.  She brought him back to the hospital today, June 29, 2006.  In the emergency room, the patient was seen by Dr. Chaney Malling,  covering for Dr. Carola Frost, as well as Dr. Donnie Aho and they called the  hospitalist service to admit the patient.  The patient himself currently  reports no chest pain but he reports shortness of breath and a cough.  The patient is also denying any abdominal pain.  He denies any headache.   PAST MEDICAL HISTORY:  1. Coronary artery disease, status post coronary bypass grafting x2;      the last surgery  being in 1992.  2. Abdominal aortic aneurysm, status post repair 1998.  3. Hyperlipidemia.  4. COPD.  5. Left bundle branch block.  6. Hypertension.   MEDICATIONS:  1. Plavix 75 mg daily.  2. Toprol XL 100 mg daily.  3. Crestor 10 mg daily.  4. Zetia 10 mg daily.  5. Disopyramide 150 mg three times a day.  6. Restoril 15 mg at bedtime for sleep.  7. Percocet as needed for pain.   SOCIAL HISTORY:  The patient is married and lives with his wife.  He has  a daughter that is present at the bedside.  He is currently retired.  Does not smoke cigarettes, does not drink alcohol.   FAMILY HISTORY:  Is positive for leukemia in the father and positive for  coronary artery disease in the mother.   REVIEW OF SYSTEMS:  As per HPI.  Also the patient denies dysuria and he  reports that he is actually urinating very little  for the past days.  Denies abdominal pain, nausea or vomiting.  Other systems as per HPI.   PHYSICAL EXAMINATION:  VITAL SIGNS:  Showed temperature 100.8, blood  pressure 87/58, pulse 69, respirations 18, saturation 96% on 3 liters of  oxygen.  GENERAL:  Exam shows a well-developed, well-nourished Caucasian  gentleman in no acute distress lying on the stretcher.  MENTAL STATUS:  The patient knows the year and the month.  Does not know  the day of the week.  He is not oriented to situation at all.  He  actually does not remember having surgery or being in the hospital  before.  HEAD:  Appears normocephalic, atraumatic.  There are no signs of trauma  on the head.  EYES:  Pupils equal, round and reactive to light and accommodation.  Extraocular movements intact.  Face is symmetric without any cranial  nerve abnormalities.  THROAT:  Appears clear.  NECK:  Is supple.  No JVD, no carotid bruits.  CHEST:  Has rhonchi bilaterally.  No wheezes.  ABDOMEN:  Is soft and nontender.  Bowel sounds are present.  LOWER EXTREMITIES:  Have no edema.  The right upper extremity is placed   in a sling.  The right hand is edematous and has a laceration in the  palm.  The left upper extremity is without any signs of any trauma.   LABORATORY VALUES:  At the time of admission, chest x-ray shows some  prominent interstitial markings.  Sodium is 135, potassium 4, chloride  105, BUN 38, glucose 80, creatinine 2.2.  White blood cell count 11.3,  hemoglobin 10.4, platelet count is 141.  Urinalysis is within normal  limits.   ASSESSMENT/PLAN:  1. Altered mental status:  The exact etiology of [name]' altered      mental status is unclear.  Multiple hypotheses I can entertain      including concussion after the fall, confusion related to the use      of Percocet, confusion related to use of general anesthesia,      delirium related to the fever and the hypoxia.  Also it is not      possible this patient may have suffered a stroke.  The plan is to      admit the patient to the hospital, observe him in a controlled      environment and obtain a MRI of the brain.  If the altered mental      status and delirium remains a persistent problem, I will ask for a      neurology consultation.  2. Hypotension:  This is probably related to dehydration secondary to      poor p.o. intake as well as use of Toprol XL.  The plan is to      discontinue the Toprol, give gentle intravenous fluids and follow      the patient closely.  3. Acute renal failure:  This is probably related to the blood loss      secondary to surgery as well as the trauma and possible dehydration      related to poor fluid intake and Lasix use during the previous      hospitalization.  The patient will receive his intravenous fluids      and his volume status will be monitored closely.  4. Hypoxia:  This could be related to poor noncompliance secondary to      the trauma that the patient suffered but also could be related to  the pulmonary embolus.  The patient will have a ventilation-      perfusion scan done. 5.  Fever:  The exact etiology is unclear but the patient may be      suffering from either pneumonia.  Also need to rule out bacteremia.      I doubt he has meningitis since he really does not seem to be      having any nuchal rigidity or any profound alteration in mental      status.  For now, I will obtain blood cultures, urine culture and      start the patient empirically on vancomycin and Zosyn.  6. Right hand pain:  This could be related to trauma the patient      suffered.  I will obtain a x-ray of the right hand and orthopedic      consultation in the morning.     Lonia Blood, M.D.  Electronically Signed    SL/MEDQ  D:  06/29/2006  T:  06/29/2006  Job:  [id number]

## 2010-10-15 NOTE — Consult Note (Signed)
NAME:  Edward [name], Edward [name] NO.:  [id number]   MEDICAL RECORD NO.:  [id number]          PATIENT TYPE:  INP   LOCATION:  1823                         FACILITY:  [facility]   PHYSICIAN:  Doralee Albino. Carola Frost, M.D. DATE OF BIRTH:  August 04, 1929   DATE OF CONSULTATION:  06/27/2006  DATE OF DISCHARGE:                                 CONSULTATION   REQUESTING PHYSICIAN:  Joen Laura, MD   REASON FOR REQUEST:  Right proximal humerus fracture-dislocation.   BRIEF HISTORY OF PRESENTATION:  Edward [name] is a 77 year old male with a  significant past medical history, who sustained a ground-level fall  resulting in right shoulder pain.  He subsequently was noted to have  some difficulty breathing as well.  It was felt to be acute flash of  pulmonary edema secondary to CHF.  He has been evaluated and is going to  be admitted by the cardiology service.  The closed reduction attempt was  performed without complete skeletal relaxation.  The patient states that  he was comfortable throughout the procedure and really has not had  significant arm pain with attempted manipulation.   PAST MEDICAL HISTORY:  1. Coronary disease, status post CABG x2 operations.  2. Mild CHF with a history of CHF, current saturation of 96% on 2 L.  3. Hypertension.  4. Dyslipidemia.  5. Aortic aneurysm repair.   MEDICATION LIST:  Plavix, metoprolol, Zetia, Crestor, and some recent  pulmonary medications, Spiriva.   ALLERGIES:  CODEINE.   SOCIAL HISTORY:  The patient presents with his wife.   FAMILY MEDICAL HISTORY:  Father died of leukemia.  Mother died of  suspected heart attacks.   REVIEW OF SYSTEMS:  Reviewed with the patient.  He does have only one  kidney.  He denies any liver disease.  He does not have any recent  history of fever but he has had bronchitis in the past and again has  recently been placed on of breathing medications for this.  Denies GI  disturbances.   PHYSICAL EXAMINATION:  VITAL  SIGNS:  Currently stable, reviewed, and are  included in the chart.  GENERAL:  Edward [name] does not appear to be in any distress either from  breathing or pain.  He is conversant and quite pleasant.  His hearing is  excellent.  He appears slightly older than stated age.  He has some  abdominal protuberance but is not overweight for his height.  MUSCULOSKELETAL:  Examination the right upper extremity is notable for a  deformity about the shoulder girdle with absent glenohumeral joint to  palpation.  He has intact axillary, radial and median and ulnar median  sensation as well as intact radial, median and ulnar motor function.  He  appears to be able to contract his deltoid.  Radial pulse is 2+.  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  This does not seem to  e particularly painful for him.  The left upper extremity at the  shoulder, elbow, wrist and hand is free  of abrasions, ecchymosis, no  tenderness, full range of motion.  Intact axillary radial, median and  ulnar sensory function.  Good muscle strength.  Examination lower  extremities is notable for the absence of instability or tenderness with  palpation of the pelvis, hips, knees and ankles bilaterally.  He did not  have any a perceptible block to motion in these joints nor any decrease  in strength.  Deep peroneal, superficial peroneal and tibial nerve  sensory motor function is intact distally.  Dorsalis pedis pulses  palpable bilaterally.   X-RAYS:  A long arm AP film was reviewed.  This demonstrates a fracture-  dislocation with displacement of the greater tuberosity. He has  dislocation of the shaft with a head segment into the anterior aspect of  the glenohumeral joint.  The fracture at the anatomic neck and shaft is  not significantly displaced.   ASSESSMENT:  Dominant arm fracture-dislocation of the proximal humerus.   PLAN:  I have  recommended attempted closed reduction.  The patient  understands that the humeral head may not follow the shaft, particularly  given that the rotator cuff is most likely completely torn as evidenced  by the greater tuberosity segment.  He further understands the risk of  complications from anesthesia as well as nerve and vessel injury,  possible need for open reduction and further surgery.  I discussed these  risks with his wife as well.      Doralee Albino. Carola Frost, M.D.  Electronically Signed     MHH/MEDQ  D:  06/27/2006  T:  06/27/2006  Job:  [id number]

## 2010-11-10 ENCOUNTER — Other Ambulatory Visit: Payer: Self-pay | Admitting: Internal Medicine

## 2010-11-10 NOTE — Telephone Encounter (Signed)
Faxed script back to [facility] @ [address]...11/10/10@1 :43pm/LMB

## 2010-11-18 ENCOUNTER — Ambulatory Visit (INDEPENDENT_AMBULATORY_CARE_PROVIDER_SITE_OTHER): Payer: Medicare Other | Admitting: Internal Medicine

## 2010-11-18 ENCOUNTER — Encounter: Payer: Self-pay | Admitting: Internal Medicine

## 2010-11-18 VITALS — BP 102/60 | HR 54 | Temp 97.5°F | Ht 70.0 in | Wt 190.4 lb

## 2010-11-18 DIAGNOSIS — I10 Essential (primary) hypertension: Secondary | ICD-10-CM

## 2010-11-18 DIAGNOSIS — M109 Gout, unspecified: Secondary | ICD-10-CM

## 2010-11-18 DIAGNOSIS — Z136 Encounter for screening for cardiovascular disorders: Secondary | ICD-10-CM

## 2010-11-18 DIAGNOSIS — Z23 Encounter for immunization: Secondary | ICD-10-CM

## 2010-11-18 DIAGNOSIS — E785 Hyperlipidemia, unspecified: Secondary | ICD-10-CM

## 2010-11-18 MED ORDER — PNEUMOCOCCAL VAC POLYVALENT 25 MCG/0.5ML IJ INJ
0.5000 mL | INJECTION | Freq: Once | INTRAMUSCULAR | Status: AC
  Administered 2010-11-18: 0.5 mL via INTRAMUSCULAR

## 2010-11-18 MED ORDER — COLCHICINE 0.6 MG PO TABS: 0.6000 mg | ORAL_TABLET | Freq: Every day | ORAL | Status: DC

## 2010-11-18 NOTE — Progress Notes (Signed)
Subjective:    Patient ID: Edward [name], male    DOB: 02/20/1930, 77 y.o.   MRN: [id number]  HPI  Here for medicare wellness  Diet: heart healthy Physical activity: sedentary Depression/mood screen: negative Hearing: intact to whispered voice Visual acuity: grossly normal, performs annual eye exam  ADLs: capable Fall risk: none Home safety: good Cognitive evaluation: intact to orientation, naming, recall and repetition EOL planning: adv directives, full code/ I agree  I have personally reviewed and have noted 1. The patient's medical and social history 2. Their use of alcohol, tobacco or illicit drugs 3. Their current medications and supplements 4. The patient's functional ability including ADL's, fall risks, home safety risks and hearing or visual impairment. 5. Diet and physical activities 6. Evidence for depression or mood disorders   Chronic insomnia -  onset summer 2010 following death of wife  assoc with daytime fatigue and sleepiness  distracted by racing thoughts and "second guessing" about the end of life mgmt of his late wife  denies "depression" - "its just a normal for me without her"  denies SI or tearfulness  prior trial of restoril worked better than Ambein 10mg  left over from wife's meds  prev has tried trazadone and elavil w/o change in symptoms  resolved with restoril - needs refill on same   CAD s/p CABG - has all labwork -  denies CP or angina symptoms - no recent changes in cardiac meds  the patient reports compliance with medication(s) as prescribed. Denies adverse side effects.  COPD - symptoms stable, no recent flare; follows with pulm for same - reports compliance with ongoing medical treatment and no changes in medication dose or frequency. denies adverse side effects related to current therapy. no SOB, mild DOE -   right shoulder "frozen" - limited Right use - hard to lift arm overhead since surg 2 years ago  no pain in arm, hand or the  shoulder "if i take my hydrocodone now and then"  no neck pain or numbness   Past Medical History  Diagnosis Date  . Hypertension   . Hyperlipidemia   . Gout   . Depressed   . CAD (coronary artery disease)     s/p CABG '89, redo '00  . Dyslipidemia   . LBBB (left bundle branch block)   . Diastolic CHF, acute   . PVD (peripheral vascular disease)     s/p B CEA  . COPD (chronic obstructive pulmonary disease)      PFT 6.19.07: FEV1 465  ratio 60 with 25% response to B2.  > PFTs 8.29.07: FEV1 61%  ratio 46% no better after B2.  > add on advair 12.20.11-improved 1.31.12   Family History  Problem Relation Age of Onset  . Heart disease Mother    History  Substance Use Topics  . Smoking status: Former Smoker -- 1.5 packs/day for 40 years    Types: Cigarettes    Quit date: 05/30/1996  . Smokeless tobacco: Never Used  . Alcohol Use: Yes     occaisonal beer    Review of Systems  Constitutional: Negative for fever.  Respiratory: Negative for cough and shortness of breath.   Cardiovascular: Negative for chest pain.  Gastrointestinal: Negative for abdominal pain.  Musculoskeletal: Negative for gait problem.  Skin: Negative for rash.  Neurological: Negative for dizziness.  No other specific complaints in a complete review of systems (except as listed in HPI above).      Objective:   Physical Exam  BP 102/60  Pulse 54  Temp(Src) 97.5 F (36.4 C) (Oral)  Ht 5\' 10"  (1.778 m)  Wt 190 lb 6.4 oz (86.365 kg)  BMI 27.32 kg/m2  SpO2 96%  Physical Exam  Constitutional:  oriented to person, place, and time. appears well-developed and well-nourished. No distress.  Neck: Normal range of motion. Neck supple. No JVD present. No thyromegaly present.  Cardiovascular: Normal rate, regular rhythm and normal heart sounds.  No murmur heard. Pulmonary/Chest: Effort normal and breath sounds normal. No respiratory distress. no wheezes.  Abdominal: Soft. Bowel sounds are normal. Patient exhibits  no distension. There is no tenderness.  Musculoskeletal: Normal range of motion. Patient exhibits no edema.  Neurological: he is alert and oriented to person, place, and time. No cranial nerve deficit. Coordination normal.  Skin: Skin is warm and dry.  No erythema or ulceration.  Psychiatric: he has a normal mood and affect. behavior is normal. Judgment and thought content normal.   Lab Results  Component Value Date   CHOL 180 04/10/2009   HDL 30 04/10/2009        Assessment & Plan:  AWV/v70.0 - Today patient counseled on age appropriate routine health concerns for screening and prevention, each reviewed and up to date or declined. Immunizations reviewed and up to date or declined. Labs/ECG reviewed (done with cardiology office, declines blood work here). Risk factors for depression reviewed and negative. Hearing function and visual acuity are intact. ADLs screened and addressed as needed. Functional ability and level of safety reviewed and appropriate. Education, counseling and referrals performed based on assessed risks today. Patient provided with a copy of personalized plan for preventive services.  Also See problem list. Medications and labs reviewed today.

## 2010-11-18 NOTE — Assessment & Plan Note (Signed)
BP Readings from Last 3 Encounters:  11/18/10 102/60  09/20/10 118/72  06/29/10 110/60   The current medical regimen is effective;  continue present plan and medications.

## 2010-11-18 NOTE — Assessment & Plan Note (Signed)
Rare flares - uses [facility] for same prn - requests refill today

## 2010-11-18 NOTE — Patient Instructions (Signed)
It was good to see you today. We have reviewed your prior records including labs and tests today Refill on gout medication today Pneumonia shot and Tdap given today Continue to follow your labs (blood work) with Dr. Donnie Aho and let us know if we need to do the labs here Please schedule followup in 6 months for blood pressure check, call sooner if problems.

## 2010-11-18 NOTE — Assessment & Plan Note (Signed)
On statin - follows labs at cards for same Donnie Aho) The current medical regimen is effective;  continue present plan and medications.

## 2010-11-29 ENCOUNTER — Telehealth: Payer: Self-pay | Admitting: Internal Medicine

## 2010-11-29 MED ORDER — FLUTICASONE-SALMETEROL 250-50 MCG/DOSE IN AEPB: 1.0000 | INHALATION_SPRAY | Freq: Two times a day (BID) | RESPIRATORY_TRACT | Status: DC | PRN

## 2010-11-29 MED ORDER — TIOTROPIUM BROMIDE MONOHYDRATE 18 MCG IN CAPS: 18.0000 ug | ORAL_CAPSULE | Freq: Every day | RESPIRATORY_TRACT | Status: DC

## 2010-11-29 NOTE — Telephone Encounter (Signed)
Refills sent. Pt aware.  Jennifer Castillo, CMA  

## 2011-02-14 ENCOUNTER — Telehealth: Payer: Self-pay | Admitting: Internal Medicine

## 2011-02-14 MED ORDER — TIOTROPIUM BROMIDE MONOHYDRATE 18 MCG IN CAPS: 18.0000 ug | ORAL_CAPSULE | Freq: Every day | RESPIRATORY_TRACT | Status: DC

## 2011-02-14 MED ORDER — FLUTICASONE-SALMETEROL 250-50 MCG/DOSE IN AEPB: 1.0000 | INHALATION_SPRAY | Freq: Two times a day (BID) | RESPIRATORY_TRACT | Status: DC | PRN

## 2011-02-14 NOTE — Telephone Encounter (Signed)
Prescriptions sent

## 2011-03-09 ENCOUNTER — Encounter: Payer: Self-pay | Admitting: Internal Medicine

## 2011-03-31 ENCOUNTER — Telehealth: Payer: Self-pay | Admitting: *Deleted

## 2011-03-31 NOTE — Telephone Encounter (Signed)
A user error has taken place open by mistake

## 2011-04-05 ENCOUNTER — Telehealth: Payer: Self-pay | Admitting: Internal Medicine

## 2011-04-05 LAB — LIPID PANEL
Cholesterol: 124 mg/dL (ref 0–200)
HDL: 34 mg/dL — AB (ref 35–70)

## 2011-04-05 NOTE — Telephone Encounter (Signed)
Received copies from [name]., M.D.,on 04/05/11. Forwarded  4pages to Dr. Morrison Old review.

## 2011-05-10 ENCOUNTER — Other Ambulatory Visit: Payer: Self-pay | Admitting: Internal Medicine

## 2011-05-11 NOTE — Telephone Encounter (Signed)
[facility]ed [facility] to [facility] @ [phone]....05/11/11@9 :50am/LMB

## 2011-05-12 ENCOUNTER — Encounter: Payer: Self-pay | Admitting: Internal Medicine

## 2011-05-12 ENCOUNTER — Telehealth: Payer: Self-pay | Admitting: Internal Medicine

## 2011-05-12 ENCOUNTER — Ambulatory Visit (INDEPENDENT_AMBULATORY_CARE_PROVIDER_SITE_OTHER): Payer: Medicare Other | Admitting: Internal Medicine

## 2011-05-12 DIAGNOSIS — J449 Chronic obstructive pulmonary disease, unspecified: Secondary | ICD-10-CM

## 2011-05-12 DIAGNOSIS — I10 Essential (primary) hypertension: Secondary | ICD-10-CM

## 2011-05-12 DIAGNOSIS — Z1211 Encounter for screening for malignant neoplasm of colon: Secondary | ICD-10-CM

## 2011-05-12 DIAGNOSIS — E785 Hyperlipidemia, unspecified: Secondary | ICD-10-CM

## 2011-05-12 NOTE — Patient Instructions (Signed)
It was good to see you today. Medications reviewed, no changes at this time. we'll make referral to Dr. Russella Dar for colonoscopy screening. Our office will contact you regarding appointment(s) once made. Continue to follow your labs (blood work) with Dr. Donnie Aho and let us know if we need to do the labs here Please schedule followup in 6 months for blood pressure check, call sooner if problems.

## 2011-05-12 NOTE — Assessment & Plan Note (Signed)
On statin - follows labs at cards for same Edward [name]) The current medical regimen is effective;  continue present plan and medications.

## 2011-05-12 NOTE — Telephone Encounter (Signed)
Reviewed pt's chart.  Looks like lori already sent rx in for both of these meds on 02/14/11 for # 90 x 1 refill.  Called and spoke with [facility] and verified that pt does indeed have a refill remaining on file and all pt needs to do is call [facility] and ok for them to ship meds.  Called and spoke with pt and informed him of the above information.  Gave him [facility]'s phone number and instructed him to ask to speak to a representative who can help him with getting his meds mailed to him.  Pt verbalized understanding and thanked me for the help.  Pt also requested to schedule f/u appt with MW- scheduled pt for 06/13/11 at 10:15 am.

## 2011-05-12 NOTE — Assessment & Plan Note (Signed)
BP Readings from Last 3 Encounters:  05/12/11 130/70  11/18/10 102/60  09/20/10 118/72   The current medical regimen is effective;  continue present plan and medications.

## 2011-05-12 NOTE — Assessment & Plan Note (Signed)
symptoms well controlled at this time Follows with pulm annually for same The current medical regimen is effective;  continue present plan and medications.

## 2011-05-12 NOTE — Progress Notes (Signed)
  Subjective:    Patient ID: Edward [name], male    DOB: [birth year], 77 y.o.   MRN: [id number]  HPI  Here for follow up - reviewed chronic medical issues:  Chronic insomnia -  onset summer 2010 following death of wife  assoc with daytime fatigue and sleepiness  distracted by racing thoughts and "second guessing" about the end of life mgmt of his late wife  denies "depression" - "its just a normal for me without her"  denies SI or tearfulness  prior trial of restoril worked better than Ambein 10mg  left over from wife's meds  prev has tried trazadone and elavil w/o change in symptoms  resolved with restoril - needs refill on same   CAD s/p CABG - has all labwork done with cardiology -  denies chest pain or angina symptoms - no recent changes in cardiac meds  the patient reports compliance with medication(s) as prescribed. Denies adverse side effects.  COPD - symptoms stable, no recent flare; follows with pulm for same - reports compliance with ongoing medical treatment and no changes in medication dose or frequency. denies adverse side effects related to current therapy. No shortness of breath, mild dyspnea on exertion -   right shoulder "frozen" - limited use - hard to lift arm overhead since surg 2 years ago  no pain in arm, hand or the shoulder "if i take my hydrocodone now and then"  no neck pain or numbness   Past Medical History  Diagnosis Date  . Hypertension   . Hyperlipidemia   . Gout   . Depressed   . CAD (coronary artery disease)     s/p CABG '89, redo '00  . Dyslipidemia   . LBBB (left bundle branch block)   . Diastolic CHF, acute   . PVD (peripheral vascular disease)     s/p B CEA  . COPD (chronic obstructive pulmonary disease)      PFT 6.19.07: FEV1 465  ratio 60 with 25% response to B2.  > PFTs 8.29.07: FEV1 61%  ratio 46% no better after B2.  > add on advair 12.20.11-improved 1.31.12    Review of Systems Constitutional: Negative for fever.  Respiratory:  Negative for cough and shortness of breath.   Cardiovascular: Negative for chest pain.      Objective:   Physical Exam  BP 130/70  Pulse 53  Temp(Src) 98.6 F (37 C) (Oral)  Wt 191 lb 9.6 oz (86.909 kg)  SpO2 97% Wt Readings from Last 3 Encounters:  05/12/11 191 lb 9.6 oz (86.909 kg)  11/18/10 190 lb 6.4 oz (86.365 kg)  09/20/10 189 lb (85.73 kg)   Constitutional: He appears well-developed and well-nourished. No distress.  Neck: Normal range of motion. Neck supple. No JVD present. No thyromegaly present.  Cardiovascular: Normal rate, regular rhythm and normal heart sounds.  No murmur heard. Pulmonary/Chest: Effort normal and breath sounds normal. No respiratory distress. no wheezes.   Lab Results  Component Value Date   CHOL 124 04/05/2011   TRIG 190* 04/05/2011   HDL 34* 04/05/2011        Assessment & Plan:   See problem list. Medications and labs reviewed today. Time spent with pt today 25 minutes, greater than 50% time spent counseling patient on COPD, lipid review, depression and medication review. Also review of prior records from cards

## 2011-05-13 ENCOUNTER — Telehealth: Payer: Self-pay | Admitting: Internal Medicine

## 2011-05-13 MED ORDER — FLUTICASONE-SALMETEROL 250-50 MCG/DOSE IN AEPB: 1.0000 | INHALATION_SPRAY | Freq: Two times a day (BID) | RESPIRATORY_TRACT | Status: DC | PRN

## 2011-05-13 MED ORDER — TIOTROPIUM BROMIDE MONOHYDRATE 18 MCG IN CAPS: 18.0000 ug | ORAL_CAPSULE | Freq: Every day | RESPIRATORY_TRACT | Status: DC

## 2011-05-13 NOTE — Telephone Encounter (Signed)
Patient calling back. Informed patient that rx's for spiriva and advair has been sent and that he needs to make sure he must keep appt w/ MW in Jan.  Edward [name]

## 2011-05-13 NOTE — Telephone Encounter (Signed)
rx has been sent in for pt for the spiriva and advair for #90 day with no refills.  Pt has appt in jan. With MW.  Jeanene Erb and lmomtcb for pt to make him aware that he must keep this appt. In [place] with MW.

## 2011-06-13 ENCOUNTER — Ambulatory Visit (INDEPENDENT_AMBULATORY_CARE_PROVIDER_SITE_OTHER): Payer: Medicare Other | Admitting: Internal Medicine

## 2011-06-13 ENCOUNTER — Encounter: Payer: Self-pay | Admitting: Internal Medicine

## 2011-06-13 DIAGNOSIS — J449 Chronic obstructive pulmonary disease, unspecified: Secondary | ICD-10-CM

## 2011-06-13 DIAGNOSIS — I10 Essential (primary) hypertension: Secondary | ICD-10-CM

## 2011-06-13 NOTE — Progress Notes (Signed)
Subjective:    Patient ID: Edward [name], male    DOB: 1930-05-18   MRN: [id number]  HPI  Brief patient profile:  81  yowm who quit smoking in 1998 with an FEV1 of 61% (GOLD II) recorded in August of 2007 at wt low 190s  maintained on monotherapy with no significant variability between his good and bad days or need for rescue therapy with primatene.   June 19, 2008 ov to refill spiriva using primatene after exerts for faster recovery. no increase doe, cough or tendency to exacerbations. ex tol = > moderate adl's.  rec no change unless rescue need rises  June 22, 2009 Yearly followup. Pt states that his breathing is not limiting him but he is relatively sedentary. rec continue spriva   May 17, 2010 ov cc sob worse since ran out of primatene, apparenlty using this much more than reported at last ov. no purulent sputum.but coughing more now  rec Start Advair 250 / 50 one twice daily and rinse and gargle but cough gets worse ok to stop it  Prilosec before bfast and pepcid 20 mg at bedtime as long as coughing ( reflux is to cough what oxygen is to fire)  GERD (REFLUX) diet     09/20/2010 ov/  Edward [name] cc increase hoarse, throat congestion, thinks it's his allergies. Dry cough, not waking him up at night and better overall ex tol, never needs primatene anymore. rec  Try the symbicort 160 Take 2 puffs first thing in am and then another 2 puffs about 12 hours later.  Work on inhaler technique Did not like the delivery system on symbicort > back on advair  06/13/2011 f/u ov/Edward [name] cc no change doe, not very limited but not very active, no purulent sputum, not really limited from desired activities  Sleeping ok without nocturnal  or early am exacerbation  of respiratory  c/o's or need for noct saba. Also denies any obvious fluctuation of symptoms with weather or environmental changes or other aggravating or alleviating factors except as outlined above   ROS  At present neg for  any  significant sore throat, dysphagia, itching, sneezing,  nasal congestion or excess/ purulent secretions,  fever, chills, sweats, unintended wt loss, pleuritic or exertional cp, hempoptysis, orthopnea pnd or leg swelling.  Also denies presyncope, palpitations, heartburn, abdominal pain, nausea, vomiting, diarrhea  or change in bowel or urinary habits, dysuria,hematuria,  rash, arthralgias, visual complaints, headache, numbness weakness or ataxia.         Past Medical History:  Hypertension  COPD  - PFT's 11/15/05 FEV1 46% ratio 60 with 25% response to B2  - PFT's 01/25/06 FEV1 61%, ratio 46%, no better after B2  - Add on Advair May 18, 2010 > improved June 29, 2010  Hyperlipidemia  Gout  Depression  CAD - s/p CABG '89, redo '00  Dyslipidemia  chronic LBBB  diastolic CHF  PVD s/p B CEA  MD roster:  pulm - Dinna Severs  card - [facility]             Objective:   Physical Exam Pot bellied hoarse wm nad BP 108/54  Pulse 41  Temp(Src) 97.8 F (36.6 C) (Oral)  Ht 5\' 10"  (1.778 m)  Wt 192 lb (87.091 kg)  BMI 27.55 kg/m2  SpO2 95%    wt 197 > 185 June 19, 2008 >   > 194 June 29, 2010  > 09/20/2010  189 > 06/13/2011  192  HEENT mild turbinate edema. Oropharynx no  thrush or excess pnd or cobblestoning. No JVD or cervical adenopathy. Mild accessory muscle hypertrophy. Trachea midline, nl thryroid. Chest was hyperinflated by percussion with diminished breath sounds and moderate increased exp time without wheeze. Hoover sign positive at mid inspiration. Regular rate and rhythm without murmur gallop or rub or increase P2. No edema. Abd: no hsm, nl excursion. Ext warm without cyanosis or clubbing    cxr 09/20/2010 :  Copd only, no nodules Assessment & Plan:

## 2011-06-13 NOTE — Patient Instructions (Addendum)
High doses of lopressor (Toprol/ metaprolol) can potentially block the advair benefits and both your blood pressure and pulse are very low today so I recommend you speak to Dr Donnie Aho at your convenience to adjust this medication to the lowest effective dose or consider alternatives (bisoprolol and bystolic which don't  Block the effects of advair).   If you are satisfied with your treatment plan let your doctor know and he/she can either refill your medications or you can return here when your prescription runs out.     If in any way you are not 100% satisfied,  please tell us.  If 100% better, tell your friends!   Pulmonary follow up can be as needed

## 2011-06-17 NOTE — Assessment & Plan Note (Signed)
Tolerating B Blockers well at this point - if higher doses needed  prefer in this setting: Bystolic, the most beta -1  selective Beta blocker available in sample form, with bisoprolol the most selective generic choice  on the market.

## 2011-06-17 NOTE — Assessment & Plan Note (Addendum)
PFT's 11/15/05 FEV1 46% ratio 60 with 25% response to B2  - PFT's 01/25/06 FEV1 61%, ratio 46%, no better after B2  - HFA 25 - 75% p coaching  09/20/2010   GOLD II COPD with minimal limiting or bothersome symptoms so no need for regular f/u at this point    Each maintenance medication was reviewed in detail including most importantly the difference between maintenance and as needed and under what circumstances the prns are to be used.  Please see instructions for details which were reviewed in writing and the patient given a copy.

## 2011-07-19 ENCOUNTER — Telehealth: Payer: Self-pay | Admitting: Gastroenterology

## 2011-07-19 NOTE — Telephone Encounter (Signed)
Patient is scheduled for 08/15/11 10:00

## 2011-07-19 NOTE — Telephone Encounter (Signed)
He probably does not need another colonoscopy but we should see him for an office visit to make the best decision

## 2011-07-19 NOTE — Telephone Encounter (Signed)
Patient did have a colonoscopy 07/05/07.  It showed " internal hemorrhoids and melanosis coli-mild".  No polyps.  The indications for the procedure in 09 were change in bowel habits, rectal bleeding, and constipation.  He reports continued constipation and states he can't have a BM without a laxative.  He will be 82 in a few weeks and is on Plavix.  Dr Felicity Coyer has referred him for a screening colon.  He has no other GI complaints.  He is asking if he needs a colonoscopy now?  Please review and advise.  I will place a copy of the colon report in your office.

## 2011-08-15 ENCOUNTER — Telehealth: Payer: Self-pay | Admitting: Internal Medicine

## 2011-08-15 ENCOUNTER — Ambulatory Visit (INDEPENDENT_AMBULATORY_CARE_PROVIDER_SITE_OTHER): Payer: Medicare Other | Admitting: Gastroenterology

## 2011-08-15 ENCOUNTER — Encounter: Payer: Self-pay | Admitting: Gastroenterology

## 2011-08-15 VITALS — BP 120/72 | HR 60 | Ht 70.0 in | Wt 190.0 lb

## 2011-08-15 DIAGNOSIS — K5900 Constipation, unspecified: Secondary | ICD-10-CM

## 2011-08-15 MED ORDER — FLUTICASONE-SALMETEROL 250-50 MCG/DOSE IN AEPB: 1.0000 | INHALATION_SPRAY | Freq: Two times a day (BID) | RESPIRATORY_TRACT | Status: DC

## 2011-08-15 MED ORDER — TIOTROPIUM BROMIDE MONOHYDRATE 18 MCG IN CAPS: 18.0000 ug | ORAL_CAPSULE | Freq: Every day | RESPIRATORY_TRACT | Status: DC

## 2011-08-15 NOTE — Progress Notes (Signed)
History of Present Illness: This is an 78 year old male with multiple comorbidities who has had constipation for many years and he states his constipation has gradually worsened over the years. He previously underwent colonoscopy in February 2009 for constipation, hematochezia and a change in bowel habits. Moderate-sized internal hemorrhoids and mild melanosis coli was noted. He currently is taking milk of magnesia and MiraLax as needed. He states he generally needs one of these to laxatives about every 2-3 days.  Denies weight loss, abdominal pain, diarrhea, change in stool caliber, melena, hematochezia, nausea, vomiting, dysphagia, reflux symptoms, chest pain.  Review of Systems: Pertinent positive and negative review of systems were noted in the above HPI section. All other review of systems were otherwise negative.  Current Medications, Allergies, Past Medical History, Past Surgical History, Family History and Social History were reviewed in [employer] record.  Physical Exam: General: Well developed , well nourished, elderly, no acute distress Head: Normocephalic and atraumatic Eyes:  sclerae anicteric, EOMI Ears: Normal auditory acuity Mouth: No deformity or lesions Neck: Supple, no masses or thyromegaly Lungs: Decreased breath sounds bilaterally Heart: Regular rate and rhythm; no murmurs, rubs or bruits Abdomen: Soft, non tender and non distended. No masses, hepatosplenomegaly or hernias noted. Normal Bowel sounds Rectal: Mildly enlarged prostate, no lesions, Hemoccult-negative stool Musculoskeletal: Symmetrical with no gross deformities  Skin: No lesions on visible extremities Pulses:  Normal pulses noted Extremities: No clubbing, cyanosis, edema or deformities noted Neurological: Alert oriented x 4, grossly nonfocal Cervical Nodes:  No significant cervical adenopathy Inguinal Nodes: No significant inguinal adenopathy Psychological:  Alert and cooperative.  Normal mood and affect  Assessment and Recommendations:  1. Chronic constipation. Advised to use MiraLax once or twice daily titrated for adequate bowel movements. May use milk of magnesia for constipation that is refractory. Given that his constipation is chronic, he has significant cardiopulmonary comorbidities, he has Hemoccult-negative stool and he underwent colonoscopy 4 years ago, we will plan to treat his constipation without repeating his colonoscopy.

## 2011-08-15 NOTE — Telephone Encounter (Signed)
Patient is requesting refills for Spiriva and Advair be sent to [facility]. Prescriptions have been sent.

## 2011-08-15 NOTE — Patient Instructions (Addendum)
Increase taking your Miralax 1-2 capfuls twice daily titrate depending on bowel movements. cc: Rene Paci, MD

## 2011-08-15 NOTE — Telephone Encounter (Signed)
lmomtcb x1 

## 2011-08-16 ENCOUNTER — Encounter: Payer: Self-pay | Admitting: Gastroenterology

## 2011-11-10 ENCOUNTER — Ambulatory Visit (INDEPENDENT_AMBULATORY_CARE_PROVIDER_SITE_OTHER): Payer: Medicare Other | Admitting: Internal Medicine

## 2011-11-10 ENCOUNTER — Encounter: Payer: Self-pay | Admitting: Internal Medicine

## 2011-11-10 VITALS — BP 118/52 | HR 64 | Temp 97.0°F | Ht 70.0 in | Wt 200.4 lb

## 2011-11-10 DIAGNOSIS — J449 Chronic obstructive pulmonary disease, unspecified: Secondary | ICD-10-CM

## 2011-11-10 DIAGNOSIS — I10 Essential (primary) hypertension: Secondary | ICD-10-CM

## 2011-11-10 DIAGNOSIS — E785 Hyperlipidemia, unspecified: Secondary | ICD-10-CM

## 2011-11-10 DIAGNOSIS — G4700 Insomnia, unspecified: Secondary | ICD-10-CM

## 2011-11-10 MED ORDER — TEMAZEPAM 15 MG PO CAPS: 15.0000 mg | ORAL_CAPSULE | Freq: Every evening | ORAL | Status: DC | PRN

## 2011-11-10 NOTE — Assessment & Plan Note (Signed)
Prefers temazepam to other prior meds (see HPI for other med trials) The current medical regimen is effective;  continue present plan and medications.   

## 2011-11-10 NOTE — Assessment & Plan Note (Signed)
05/2011 OV with pulm reviewed - stable symptoms > instructed to follow up prn The current medical regimen is effective;  continue present plan and medications.   

## 2011-11-10 NOTE — Assessment & Plan Note (Signed)
BP Readings from Last 3 Encounters:  11/10/11 118/52  08/15/11 120/72  06/13/11 108/54   The current medical regimen is effective;  continue present plan and medications.

## 2011-11-10 NOTE — Progress Notes (Signed)
  Subjective:    Patient ID: Edward [name], male    DOB: 13-Sep-1929, 78 y.o.   MRN: [id number]  HPI  Here for follow up - reviewed chronic medical issues:  Chronic insomnia - onset summer 2010 following death of wife  distracted by racing thoughts and "second guessing" about the end of life mgmt of his late wife  denies "depression" - "its just a normal for me without her"  denies SI or tearfulness -  prev has tried trazodone, Ambien/zolipedem and elavil without change in symptoms  resolved with restoril - needs refill on same   CAD s/p CABG - has all labwork done with cardiology -  denies chest pain or angina symptoms - no recent changes in cardiac meds  the patient reports compliance with medication(s) as prescribed. Denies adverse side effects.  COPD - symptoms stable, no recent flare; follows with pulm for same - reports compliance with ongoing medical treatment and no changes in medication dose or frequency. denies adverse side effects related to current therapy. No shortness of breath, mild dyspnea on exertion -   right shoulder "frozen" - limited use - hard to lift arm overhead since surg 2010  no pain in arm, hand or the shoulder "if i take my hydrocodone now and then"  no neck pain or numbness   Past Medical History  Diagnosis Date  . Hypertension   . Hyperlipidemia   . Gout   . Depressed   . CAD (coronary artery disease)     s/p CABG '89, redo '00  . Dyslipidemia   . LBBB (left bundle branch block)   . Diastolic CHF, acute   . PVD (peripheral vascular disease)     s/p B CEA  . COPD (chronic obstructive pulmonary disease)      PFT 6.19.07: FEV1 465  ratio 60 with 25% response to B2.  > PFTs 8.29.07: FEV1 61%  ratio 46% no better after B2.  > add on advair 12.20.11-improved 1.31.12  . AAA (abdominal aortic aneurysm)   . Arthritis   . Melanosis coli   . Internal hemorrhoids     Review of Systems Constitutional: Negative for fever.  Respiratory: Negative for  cough and shortness of breath.   Cardiovascular: Negative for chest pain or palpitations.      Objective:   Physical Exam  BP 118/52  Pulse 64  Temp 97 F (36.1 C) (Oral)  Ht 5\' 10"  (1.778 m)  Wt 200 lb 6.4 oz (90.901 kg)  BMI 28.75 kg/m2  SpO2 97% Wt Readings from Last 3 Encounters:  11/10/11 200 lb 6.4 oz (90.901 kg)  08/15/11 190 lb (86.183 kg)  06/13/11 192 lb (87.091 kg)   Constitutional: He appears well-developed and well-nourished. No distress.  Neck: Normal range of motion. Neck supple. No JVD present. No thyromegaly present.  Cardiovascular: Normal rate, regular rhythm and normal heart sounds.  No murmur heard. Pulmonary/Chest: Effort normal and breath sounds normal. No respiratory distress. no wheezes.   Lab Results  Component Value Date   CHOL 124 04/05/2011   TRIG 190* 04/05/2011   HDL 34* 04/05/2011        Assessment & Plan:   See problem list. Medications and labs reviewed today. Time spent with pt today 25 minutes, greater than 50% time spent counseling patient on COPD, lipid review, depression and medication review. Also review of prior records from cards

## 2011-11-10 NOTE — Patient Instructions (Signed)
It was good to see you today. Medications reviewed, no changes at this time. Refill on medication(s) as discussed today.  Continue to follow your labs (blood work) with Dr. Donnie Aho and let us know if we need to do the labs here Please schedule followup in 6 months for blood pressure check, call sooner if problems.

## 2011-11-10 NOTE — Assessment & Plan Note (Signed)
On statin - follows labs at cards for same ([place]) The current medical regimen is effective;  continue present plan and medications.   

## 2011-12-22 ENCOUNTER — Telehealth: Payer: Self-pay | Admitting: Internal Medicine

## 2011-12-22 NOTE — Telephone Encounter (Signed)
ATC NAa Line busy x 3. rx was sent in march to [facility] 3 months supply x 3 refills.

## 2011-12-22 NOTE — Telephone Encounter (Signed)
I called pt line rang multiple times then received VM--[id number] x1

## 2011-12-23 MED ORDER — FLUTICASONE-SALMETEROL 250-50 MCG/DOSE IN AEPB: 1.0000 | INHALATION_SPRAY | Freq: Two times a day (BID) | RESPIRATORY_TRACT | Status: DC

## 2011-12-23 MED ORDER — TIOTROPIUM BROMIDE MONOHYDRATE 18 MCG IN CAPS: 18.0000 ug | ORAL_CAPSULE | Freq: Every day | RESPIRATORY_TRACT | Status: DC

## 2011-12-23 NOTE — Telephone Encounter (Signed)
Pt returned call.  Is requesting refills on advair and spiriva to [facility].  Pt was last seen by MW 05/2011, told to follow up with pulmonary as needed.  Pt did state that he would like to see MW again in Sept.  Ov scheduled w/ MW 9.23.13.  Appt card mailed to pt's verified home address.  Refills sent as requested.

## 2011-12-26 ENCOUNTER — Telehealth: Payer: Self-pay | Admitting: Internal Medicine

## 2011-12-26 NOTE — Telephone Encounter (Signed)
Called and spoke with patient who was returning a call from Friday afternoon around 5pm per patient.  Looking through documentation does not appear to have been a message documented about this attempted call on Friday (12/23/11).  I informed patient of this and went over the telephone message from 12/23/11 at 946am regarding refills and his appointment that was set up.  Patient verbalized understanding of these and stated that if all that was set up and taken care of he needed nothing further at this time.

## 2012-02-20 ENCOUNTER — Ambulatory Visit (INDEPENDENT_AMBULATORY_CARE_PROVIDER_SITE_OTHER): Payer: Medicare Other | Admitting: Internal Medicine

## 2012-02-20 ENCOUNTER — Encounter: Payer: Self-pay | Admitting: Internal Medicine

## 2012-02-20 VITALS — BP 100/60 | HR 70 | Temp 97.5°F | Ht 70.0 in | Wt 195.8 lb

## 2012-02-20 DIAGNOSIS — J449 Chronic obstructive pulmonary disease, unspecified: Secondary | ICD-10-CM

## 2012-02-20 DIAGNOSIS — Z23 Encounter for immunization: Secondary | ICD-10-CM

## 2012-02-20 DIAGNOSIS — J4489 Other specified chronic obstructive pulmonary disease: Secondary | ICD-10-CM

## 2012-02-20 MED ORDER — MOMETASONE FURO-FORMOTEROL FUM 200-5 MCG/ACT IN AERO: INHALATION_SPRAY | RESPIRATORY_TRACT | Status: DC

## 2012-02-20 NOTE — Patient Instructions (Addendum)
Work on inhaler technique:  relax and gently blow all the way out then take a nice smooth deep breath back in, triggering the inhaler at same time you start breathing in.  Hold for up to 5 seconds if you can.  Rinse and gargle with water when done   If your mouth or throat starts to bother you,   I suggest you time the inhaler to your dental care and after using the inhaler(s) brush teeth and tongue with a baking soda containing toothpaste and when you rinse this out, gargle with it first to see if this helps your mouth and throat.     Stop advair and fish oil  Start dulera 200 Take 2 puffs first thing in am and then another 2 puffs about 12 hours later - if you like it we can fill it for you for 3 months at a time    Please schedule a follow up visit in 3 months but call sooner if needed for PFT's

## 2012-02-20 NOTE — Assessment & Plan Note (Addendum)
-   PFT's 11/15/05 FEV1 46% ratio 60 with 25% response to B2  - PFT's 01/25/06 FEV1 61%, ratio 46%, no better after B2  - HFA 75% p coaching 02/20/2012  > trial of dulera 200 2bid   DDX of  difficult airways managment all start with A and  include Adherence, Ace Inhibitors, Acid Reflux, Active Sinus Disease, Alpha 1 Antitripsin deficiency, Anxiety masquerading as Airways dz,  ABPA,  allergy(esp in young), Aspiration (esp in elderly), Adverse effects of DPI,  Active smokers, plus two Bs  = Bronchiectasis and Beta blocker use..and one C= CHF  Adherence is always the initial "prime suspect" and is a multilayered concern that requires a "trust but verify" approach in every patient - starting with knowing how to use medications, especially inhalers, correctly, keeping up with refills and understanding the fundamental difference between maintenance and prns vs those medications only taken for a very short course and then stopped and not refilled. The proper method of use, as well as anticipated side effects, of a metered-dose inhaler are discussed and demonstrated to the patient. Improved effectiveness after extensive coaching during this visit to a level of approximately  75%  ? Adverse effect of dpi/s/ advair > trial of dulera 200 2bid   ? Acid or non acid reflux > trial off fish oil  ? Beta blocker effect > still may consider trial off toprol and on bisoprolol    Each maintenance medication was reviewed in detail including most importantly the difference between maintenance and as needed and under what circumstances the prns are to be used.  Please see instructions for details which were reviewed in writing and the patient given a copy.

## 2012-02-20 NOTE — Progress Notes (Signed)
Subjective:    Patient ID: Edward [name], male    DOB: August 10, 1929   MRN: [id number]  HPI  Brief patient profile:  82yowm who quit smoking in 1998 with an FEV1 of 61% (GOLD II) recorded in August of 2007 at wt low 190s     June 19, 2008 ov to refill spiriva using primatene after exerts for faster recovery. no increase doe, cough or tendency to exacerbations. ex tol = > moderate adl's.  rec no change unless rescue need rises  June 22, 2009 Yearly followup. Pt states that his breathing is not limiting him but he is relatively sedentary. rec continue spriva   May 17, 2010 ov cc sob worse since ran out of primatene, apparenlty using this much more than reported at last ov. no purulent sputum.but coughing more now  rec Start Advair 250 / 50 one twice daily and rinse and gargle but cough gets worse ok to stop it  Prilosec before bfast and pepcid 20 mg at bedtime as long as coughing ( reflux is to cough what oxygen is to fire)  GERD (REFLUX) diet     09/20/2010 ov/  Edward [name] cc increase hoarse, throat congestion, thinks it's his allergies. Dry cough, not waking him up at night and better overall ex tol, never needs primatene anymore. rec  Try the symbicort 160 Take 2 puffs first thing in am and then another 2 puffs about 12 hours later.  Work on inhaler technique Did not like the delivery system on symbicort > back on advair  06/13/2011 f/u ov/Edward [name] cc no change doe, not very limited but not very active, no purulent sputum, not really limited from desired activities rec High doses of lopressor (Toprol/ metaprolol) can potentially block the advair benefits and both your blood pressure and pulse are very low today so I recommend you speak to Edward [name] at your convenience to adjust this medication to the lowest effective dose or consider alternatives (bisoprolol and bystolic which don't  Block the effects of advair). Pulmonary follow up can be as needed  02/20/2012 f/u ov/Edward [name] cc breathing  variably worse assoc with worse hoarseness on advair.  No unusual cough, purulent sputum or sinus/hb symptoms on present rx.     Sleeping ok without nocturnal  or early am exacerbation  of respiratory  c/o's or need for noct saba. Also denies any obvious fluctuation of symptoms with weather or environmental changes or other aggravating or alleviating factors except as outlined above   ROS  At present neg for  any significant sore throat, dysphagia, itching, sneezing,  nasal congestion or excess/ purulent secretions,  fever, chills, sweats, unintended wt loss, pleuritic or exertional cp, hempoptysis, orthopnea pnd or leg swelling.  Also denies presyncope, palpitations, heartburn, abdominal pain, nausea, vomiting, diarrhea  or change in bowel or urinary habits, dysuria,hematuria,  rash, arthralgias, visual complaints, headache, numbness weakness or ataxia.         Past Medical History:  Hypertension  COPD  - PFT's 11/15/05 FEV1 46% ratio 60 with 25% response to B2  - PFT's 01/25/06 FEV1 61%, ratio 46%, no better after B2  - Add on Advair May 18, 2010 > improved June 29, 2010  Hyperlipidemia  Gout  Depression  CAD - s/p CABG '89, redo '00  Dyslipidemia  chronic LBBB  diastolic CHF  PVD s/p B CEA  MD roster:  pulm - Edward [name]  card - Edward [name]             Objective:  Physical Exam Pot bellied hoarse wm nad     wt   185 June 19, 2008 >   > 194 June 29, 2010  > 09/20/2010  189    > 02/20/2012  195  HEENT mild turbinate edema. Oropharynx no thrush or excess pnd or cobblestoning. No JVD or cervical adenopathy. Mild accessory muscle hypertrophy. Trachea midline, nl thryroid. Chest was hyperinflated by percussion with diminished breath sounds and moderate increased exp time without wheeze. Hoover sign positive at mid inspiration. Regular rate and rhythm without murmur gallop or rub or increase P2. No edema. Abd: no hsm, nl excursion. Ext warm without cyanosis or clubbing    cxr  09/20/2010 :  Copd only, no nodules Assessment & Plan:

## 2012-03-07 ENCOUNTER — Telehealth: Payer: Self-pay | Admitting: Internal Medicine

## 2012-03-07 MED ORDER — MOMETASONE FURO-FORMOTEROL FUM 200-5 MCG/ACT IN AERO: INHALATION_SPRAY | RESPIRATORY_TRACT | Status: DC

## 2012-03-07 NOTE — Telephone Encounter (Signed)
Left message for patient that Rx has been sent to mail in pharmacy. If any questions or concerns then to call the office.

## 2012-03-15 ENCOUNTER — Other Ambulatory Visit: Payer: Self-pay | Admitting: Internal Medicine

## 2012-03-15 MED ORDER — TIOTROPIUM BROMIDE MONOHYDRATE 18 MCG IN CAPS: 18.0000 ug | ORAL_CAPSULE | Freq: Every day | RESPIRATORY_TRACT | Status: DC

## 2012-03-15 NOTE — Telephone Encounter (Signed)
Pt returned triage's call.  Edward [name] ° °

## 2012-03-15 NOTE — Telephone Encounter (Signed)
lmomtcb x1 

## 2012-03-15 NOTE — Telephone Encounter (Signed)
Returning call.

## 2012-03-15 NOTE — Telephone Encounter (Signed)
ATC, no answer. LMOMTCB 

## 2012-03-15 NOTE — Telephone Encounter (Signed)
Called and spoke with patient, requesting 90 day supply spirivaRX sent to [facility].  Rx has been sent. Nothing else needed at this time.

## 2012-03-30 ENCOUNTER — Other Ambulatory Visit: Payer: Self-pay | Admitting: Cardiology

## 2012-03-30 ENCOUNTER — Ambulatory Visit
Admission: RE | Admit: 2012-03-30 | Discharge: 2012-03-30 | Disposition: A | Discharge status: Home / Self Care | Payer: Medicare Other | Source: Ambulatory Visit | Attending: Cardiology | Admitting: Cardiology

## 2012-03-30 DIAGNOSIS — M109 Gout, unspecified: Secondary | ICD-10-CM

## 2012-03-30 DIAGNOSIS — R0602 Shortness of breath: Secondary | ICD-10-CM

## 2012-03-30 DIAGNOSIS — E785 Hyperlipidemia, unspecified: Secondary | ICD-10-CM

## 2012-03-30 DIAGNOSIS — I447 Left bundle-branch block, unspecified: Secondary | ICD-10-CM

## 2012-03-30 DIAGNOSIS — Z87898 Personal history of other specified conditions: Secondary | ICD-10-CM

## 2012-03-30 DIAGNOSIS — M7500 Adhesive capsulitis of unspecified shoulder: Secondary | ICD-10-CM

## 2012-03-30 DIAGNOSIS — N289 Disorder of kidney and ureter, unspecified: Secondary | ICD-10-CM

## 2012-03-30 DIAGNOSIS — D649 Anemia, unspecified: Secondary | ICD-10-CM

## 2012-03-30 DIAGNOSIS — Z9889 Other specified postprocedural states: Secondary | ICD-10-CM

## 2012-03-30 DIAGNOSIS — I2510 Atherosclerotic heart disease of native coronary artery without angina pectoris: Secondary | ICD-10-CM

## 2012-03-30 DIAGNOSIS — G4700 Insomnia, unspecified: Secondary | ICD-10-CM

## 2012-03-30 DIAGNOSIS — I5032 Chronic diastolic (congestive) heart failure: Secondary | ICD-10-CM

## 2012-03-30 DIAGNOSIS — I10 Essential (primary) hypertension: Secondary | ICD-10-CM

## 2012-03-30 DIAGNOSIS — I739 Peripheral vascular disease, unspecified: Secondary | ICD-10-CM

## 2012-03-30 DIAGNOSIS — F329 Major depressive disorder, single episode, unspecified: Secondary | ICD-10-CM

## 2012-03-30 DIAGNOSIS — K439 Ventral hernia without obstruction or gangrene: Secondary | ICD-10-CM

## 2012-03-30 NOTE — Progress Notes (Signed)
Edward [name], Edward [name]  Date of visit:  03/30/2012 DOB:  1930-04-29    Age:  78 yrs. Medical record number:  [id number]     Account number:  [id number] Primary Care Provider: [facility] ____________________________ CURRENT DIAGNOSES  1. Dyspnea  2. CAD,Native  3. Left bundle branch hemiblock  4. Hypertension,Essential (Benign)  5. Hyperlipidemia  6. Peripheral Vascular Disease  7. Chronic Kidney Disease (Stage 3)  8. MI-S/P Inferior  9. Gout  10. Carotid artery stenosis  11. Aortic Valve Disorder  12. COPD  13. Surgery-Aortocoronary Bypass Grafting ____________________________ ALLERGIES  ACE Inhibitors, Cough-non-productive  Biaxin  codeine sulfate ____________________________ MEDICATIONS  1. multivitamin tablet, 3x week  2. aspirin 81 mg tablet, effervescent, 1 p.o. q.d.  3. Spiriva with HandiHaler 18 mcg capsule, w/inhalation device, 1 p.o. daily  4. Fish Oil 1,000 mg Capsule, 1 p.o. daily  5. temazepam 15 mg Capsule, QHS  6. Colcrys 0.6 mg tablet, PRN  7. furosemide 20 mg tablet, PRN swelling  8. Micardis 40 mg tablet, 1 p.o. daily  9. nitroglycerin 0.4 mg tablet, sublingual, PRN  10. Dulera 200-5 mcg/actuation HFA aerosol inhaler, 2 puff bid  11. Crestor 20 mg tablet, 1 p.o. daily  12. metoprolol succinate 100 mg tablet extended release 24 hr, 1 p.o. daily  13. Plavix 75 mg tablet, 1 p.o. daily ____________________________ CHIEF COMPLAINTS  Followup of CAD,Native ____________________________ HISTORY OF PRESENT ILLNESS  Patient seen for cardiac followup. Since he was previously here he is noted worsening dyspnea with exertion. He is seeing Dr. Sherene Sires to change his COPD medications some. This may have helped some but he has noted dyspnea with progressively less levels of activity. He complains of some swelling of his right arm where he had a shoulder problem. He does not have any definite angina. He denies PND, orthopnea or claudication but feels as if his legs are puffy  at times. He is concerned over his status of his previous carotid arteries. ____________________________ PAST HISTORY  Past Medical Illnesses:  hypertension, hyperlipidemia, peripheral vascular disease, COPD, history of shingles, gout, GERD;  Cardiovascular Illnesses:  diastolic CHF, CAD, S/P MI-inferior, conduction disorder-LBBB;  Surgical Procedures:  redo CABG w SVG to OM, SVG to RCA8/10/00 Dr. Laneta Simmers, CABG w LIMA to LAD, SVG to dx, OM, RCA 1989 Dr. Edwyna Shell, carotid endarterectomy-bil, cholecystectomy, hemorrhoidectomy, [name] carotid subclavian bypass, repair of anal fissure, AAA repair, R Shoulder surgery x2;  NYHA Classification:  II;  Canadian Angina Classification:  Class 0: Asymptomatic;  Cardiology Procedures-Invasive:  cardiac cath (left) 2000;  Cardiology Procedures-Noninvasive:  echocardiogram January 2008, adenosine cardiolte March 2008, echocardiogram June 2011, treadmill cardiolite June 2011;  Cardiac Cath Results:  normal Left main, occluded LAD, occluded CFX, occluded RCA, occluded Diag 1 SVG, occluded RCA SVG, 99% stenosis CFX SVG, widely patent LAD LIMA graft;  LVEF of 48% documented via nuclear study on 11/26/2009 ____________________________ CARDIO-PULMONARY TEST DATES EKG Date:  04/05/2011;   Cardiac Cath Date:  05/10/2007;  CABG: 01/07/1999;  Nuclear Study Date:  11/26/2009;  Echocardiography Date: 11/18/2009;  Chest Xray Date: 05/08/2007;   ____________________________ SOCIAL HISTORY Alcohol Use:  beer;  Smoking:  used to smoke but quit 1998, greater than 50 pack year history;  Diet:  regular diet without modifications;  Lifestyle:  widower;  Exercise:  some exercise;  Occupation:  retired Therapist, music;  Residence:  lives with son;   ____________________________ REVIEW OF SYSTEMS General:  malaise and fatigue Eyes:  wears eye glasses/contact lenses, cataracts  Respiratory:  Worsening dyspnea  with exertion  Cardiovascular:  please review HPI  Abdominal:  denies dyspepsia, GI bleeding,  constipation, or diarrhea  Genitourinary-Male:  frequency, hesitancy, erectile dysfunction  Musculoskeletal:  arthritis of the right shoulder ____________________________ PHYSICAL EXAMINATION VITAL SIGNS  Blood Pressure:  98/68 Sitting, Left arm, regular cuff  , 94/60 Standing, Left arm and regular cuff   Pulse:  60/min. Weight:  193.50 lbs. Height:  70"BMI: 28  Constitutional:  pleasant white male in no acute distress Skin:  warm and dry to touch, no apparent skin lesions, or masses noted. Head:  normocephalic, normal hair pattern, no masses or tenderness ENT:  ears, nose and throat reveal no gross abnormalities.  Dentition good. Neck:  bilateral carotid endarterectomy scars, no JVD, no bruits, no masses, non-tender Chest:  clear to auscultation and percussion, healed median sternotomy scar Cardiac:  regular rhythm, normal S1 and S2, no S3 or S4, grade 1/6 systolic murmur at aortic area radiating to neck Peripheral Pulses:  bilateral femoral bruits present, femoral pulses 2+, dorsalis pedis pulses diminished, posterior tibial pulses diminished Extremities & Back:  well healed saphenous vein donor site RLE, well healed saphenous vein donor site LLE, no edema present Neurological:  no gross motor or sensory deficits noted, affect appropriate, oriented x3. ____________________________ MOST RECENT LIPID PANEL 05/12/11  CHOL TOTL 124 mg/dl, LDL 52 calc, HDL 34 mg/dl and TRIGLYCER 161 mg/dl ____________________________ IMPRESSIONS/PLAN  1. Worsening dyspnea with exertion 2. Coronary artery disease with previous bypass grafting and redo bypass grafting  3. Aortic valve disease cardiac murmur 4. Previous bilateral carotid endarterectomy 5. COPD  Recommendations:  Obtain echocardiogram because of dyspnea to evaluate the aortic valve and LV function. Obtain lab work as noted below. Repeat carotid study to assess carotid artery disease in the presence of previous  surgery. ____________________________ TODAYS ORDERS  1. Return Visit: 6 months  2. 2D, color flow, doppler: First Available  3. Comprehensive Metabolic Panel: Today  4. Complete Blood Count: Today  5. BNP: Today  6. TSH: Today  7. CHEST XRAY: Today  8. Carotid Duplex: At Patient Convenience                       ____________________________ Cardiology Physician:  [name] MD [facility]

## 2012-04-06 ENCOUNTER — Other Ambulatory Visit (INDEPENDENT_AMBULATORY_CARE_PROVIDER_SITE_OTHER): Payer: Medicare Other | Admitting: *Deleted

## 2012-04-06 ENCOUNTER — Other Ambulatory Visit: Payer: Self-pay

## 2012-04-06 DIAGNOSIS — I6529 Occlusion and stenosis of unspecified carotid artery: Secondary | ICD-10-CM

## 2012-05-10 ENCOUNTER — Encounter: Payer: Self-pay | Admitting: Internal Medicine

## 2012-05-10 ENCOUNTER — Other Ambulatory Visit: Payer: Self-pay | Admitting: Internal Medicine

## 2012-05-10 ENCOUNTER — Ambulatory Visit (INDEPENDENT_AMBULATORY_CARE_PROVIDER_SITE_OTHER): Payer: Medicare Other | Admitting: Internal Medicine

## 2012-05-10 VITALS — BP 112/58 | HR 69 | Temp 97.4°F | Ht 70.0 in | Wt 192.8 lb

## 2012-05-10 DIAGNOSIS — I10 Essential (primary) hypertension: Secondary | ICD-10-CM

## 2012-05-10 DIAGNOSIS — J449 Chronic obstructive pulmonary disease, unspecified: Secondary | ICD-10-CM

## 2012-05-10 DIAGNOSIS — N400 Enlarged prostate without lower urinary tract symptoms: Secondary | ICD-10-CM

## 2012-05-10 DIAGNOSIS — G4700 Insomnia, unspecified: Secondary | ICD-10-CM

## 2012-05-10 MED ORDER — TEMAZEPAM 15 MG PO CAPS: 15.0000 mg | ORAL_CAPSULE | Freq: Every evening | ORAL | Status: DC | PRN

## 2012-05-10 NOTE — Assessment & Plan Note (Signed)
Prefers temazepam to other prior meds (see HPI for other med trials) The current medical regimen is effective;  continue present plan and medications.

## 2012-05-10 NOTE — Progress Notes (Signed)
  Subjective:    Patient ID: Edward [name], male    DOB: 06/11/1929, 78 y.o.   MRN: [id number]  HPI  Here for follow up - reviewed chronic medical issues:  Chronic insomnia - onset summer 2010 following death of wife  distracted by racing thoughts and "second guessing" about the end of life mgmt of his late wife  denies "depression" - "its just a normal for me without her"  denies SI or tearfulness -  prev has tried trazodone, Ambien/zolipedem and elavil without change in symptoms  resolved with restoril - needs refill on same   CAD s/p CABG - has all labwork done with cardiology -  denies chest pain or angina symptoms - no recent changes in cardiac meds  the patient reports compliance with medication(s) as prescribed. Denies adverse side effects.  COPD - symptoms stable, no recent flare; follows with pulm for same - reports compliance with ongoing medical treatment and no changes in medication dose or frequency. denies adverse side effects related to current therapy. No shortness of breath, mild dyspnea on exertion -    Past Medical History  Diagnosis Date  . Hypertension   . Hyperlipidemia   . Gout   . Depressed   . CAD (coronary artery disease)     s/p CABG '89, redo '00  . Dyslipidemia   . LBBB (left bundle branch block)   . Diastolic CHF, acute   . PVD (peripheral vascular disease)     s/p B CEA  . COPD (chronic obstructive pulmonary disease)      PFT 6.19.07: FEV1 465  ratio 60 with 25% response to B2.  > PFTs 8.29.07: FEV1 61%  ratio 46% no better after B2.  > add on advair 12.20.11-improved 1.31.12  . AAA (abdominal aortic aneurysm)   . Arthritis   . Melanosis coli   . Internal hemorrhoids     Review of Systems Constitutional: Negative for fever.  Respiratory: Negative for cough and shortness of breath.   Cardiovascular: Negative for chest pain or palpitations.      Objective:   Physical Exam  BP 112/58  Pulse 69  Temp 97.4 F (36.3 C) (Oral)  Ht 5\' 10"   (1.778 m)  Wt 192 lb 12.8 oz (87.454 kg)  BMI 27.66 kg/m2  SpO2 95% Wt Readings from Last 3 Encounters:  05/10/12 192 lb 12.8 oz (87.454 kg)  02/20/12 195 lb 12.8 oz (88.814 kg)  11/10/11 200 lb 6.4 oz (90.901 kg)   Constitutional: He appears well-developed and well-nourished. No distress.  Neck: Normal range of motion. Neck supple. No JVD present. No thyromegaly present.  Cardiovascular: Normal rate, regular rhythm and normal heart sounds.  No murmur heard. Pulmonary/Chest: Effort normal and breath sounds normal. No respiratory distress. no wheezes.   Lab Results  Component Value Date   CHOL 124 04/05/2011   TRIG 190* 04/05/2011   HDL 34* 04/05/2011        Assessment & Plan:   See problem list. Medications and labs reviewed today. Time spent with pt today 25 minutes, greater than 50% time spent counseling patient on COPD, lipid review, depression and medication review. Also review of prior records from cards

## 2012-05-10 NOTE — Assessment & Plan Note (Signed)
Follows annually with uro for this and solitary kidney ([place]) Not tol of meds for BPH - but declines change in symptoms or overflow Will help with setting up appt as requested

## 2012-05-10 NOTE — Patient Instructions (Addendum)
It was good to see you today. Medications reviewed, no changes at this time. Refill on medication(s) as discussed today.  Continue to follow your labs (blood work) with Dr. Donnie Aho and let us know if we need to do the labs here we'll make referral to [facility] urology . Our office will contact you regarding appointment(s) once made. Please schedule followup in 6 months for blood pressure check, call sooner if problems.

## 2012-05-10 NOTE — Assessment & Plan Note (Signed)
05/2011 OV with pulm reviewed - stable symptoms > instructed to follow up prn The current medical regimen is effective;  continue present plan and medications.

## 2012-05-10 NOTE — Assessment & Plan Note (Signed)
BP Readings from Last 3 Encounters:  05/10/12 112/58  02/20/12 100/60  11/10/11 118/52   The current medical regimen is effective;  continue present plan and medications

## 2012-05-21 ENCOUNTER — Ambulatory Visit (INDEPENDENT_AMBULATORY_CARE_PROVIDER_SITE_OTHER): Payer: Medicare Other | Admitting: Internal Medicine

## 2012-05-21 ENCOUNTER — Encounter: Payer: Self-pay | Admitting: Internal Medicine

## 2012-05-21 VITALS — BP 116/74 | HR 64 | Temp 98.0°F | Ht 70.0 in | Wt 194.0 lb

## 2012-05-21 DIAGNOSIS — J449 Chronic obstructive pulmonary disease, unspecified: Secondary | ICD-10-CM

## 2012-05-21 DIAGNOSIS — I10 Essential (primary) hypertension: Secondary | ICD-10-CM

## 2012-05-21 LAB — PULMONARY FUNCTION TEST

## 2012-05-21 MED ORDER — BISOPROLOL FUMARATE 10 MG PO TABS: 10.0000 mg | ORAL_TABLET | Freq: Every day | ORAL | Status: DC

## 2012-05-21 NOTE — Progress Notes (Signed)
Subjective:    Patient ID: Edward [name], male    DOB: 11-09-1929   MRN: [id number]  HPI  Brief patient profile:  82yowm who quit smoking in 1998 with an FEV1 of 61% (GOLD II) recorded in August of 2007 at wt low 190s     June 19, 2008 ov to refill spiriva using primatene after exerts for faster recovery. no increase doe, cough or tendency to exacerbations. ex tol = > moderate adl's.  rec no change unless rescue need rises  June 22, 2009 Yearly followup. Pt states that his breathing is not limiting him but he is relatively sedentary. rec continue spriva   May 17, 2010 ov cc sob worse since ran out of primatene, apparenlty using this much more than reported at last ov. no purulent sputum.but coughing more now  rec Start Advair 250 / 50 one twice daily and rinse and gargle but cough gets worse ok to stop it  Prilosec before bfast and pepcid 20 mg at bedtime as long as coughing ( reflux is to cough what oxygen is to fire)  GERD (REFLUX) diet     09/20/2010 ov/  Edward [name] cc increase hoarse, throat congestion, thinks it's his allergies. Dry cough, not waking him up at night and better overall ex tol, never needs primatene anymore. rec  Try the symbicort 160 Take 2 puffs first thing in am and then another 2 puffs about 12 hours later.  Work on inhaler technique Did not like the delivery system on symbicort > back on advair  06/13/2011 f/u ov/Edward [name] cc no change doe, not very limited but not very active, no purulent sputum, not really limited from desired activities rec High doses of lopressor (Toprol/ metaprolol) can potentially block the advair benefits and both your blood pressure and pulse are very low today so I recommend you speak to Dr Donnie Aho at your convenience to adjust this medication to the lowest effective dose or consider alternatives (bisoprolol and bystolic which don't  Block the effects of advair). Pulmonary follow up can be as needed  02/20/2012 f/u ov/Edward [name] cc breathing  variably worse assoc with worse hoarseness on advair. rec Work on inhaler technique:       Stop advair and fish oil Start dulera 200 Take 2 puffs first thing in am and then another 2 puffs about 12 hours later       05/21/2012 f/u ov/Edward [name] cc no change  doe x mailbox and back fast and feels the dulera runs out after 10 h.   No obvious daytime variabilty or assoc chronic cough or cp or chest tightness, subjective wheeze overt sinus or hb symptoms. No unusual exp hx or h/o childhood pna/ asthma or premature birth to his knowledge.     Sleeping ok without nocturnal  or early am exacerbation  of respiratory  c/o's or need for noct saba. Also denies any obvious fluctuation of symptoms with weather or environmental changes or other aggravating or alleviating factors except as outlined above   ROS  At present neg for  any significant sore throat, dysphagia, itching, sneezing,  nasal congestion or excess/ purulent secretions,  fever, chills, sweats, unintended wt loss, pleuritic or exertional cp, hempoptysis, orthopnea pnd or leg swelling.  Also denies presyncope, palpitations, heartburn, abdominal pain, nausea, vomiting, diarrhea  or change in bowel or urinary habits, dysuria,hematuria,  rash, arthralgias, visual complaints, headache, numbness weakness or ataxia.         Past Medical History:  Hypertension  COPD  -  PFT's 11/15/05 FEV1 46% ratio 60 with 25% response to B2  - PFT's 01/25/06 FEV1 61%, ratio 46%, no better after B2  - Add on Advair May 18, 2010 > improved June 29, 2010  Hyperlipidemia  Gout  Depression  CAD - s/p CABG '89, redo '00  Dyslipidemia  chronic LBBB  diastolic CHF  PVD s/p B CEA  MD roster:  pulm - Ayce Pietrzyk  card - tilley             Objective:   Physical Exam Pot bellied hoarse wm nad     wt   185 June 19, 2008 >   > 194 June 29, 2010  > 09/20/2010  189    > 02/20/2012  195 > 05/21/2012 194 HEENT mild turbinate edema. Oropharynx no thrush  or excess pnd or cobblestoning. No JVD or cervical adenopathy. Mild accessory muscle hypertrophy. Trachea midline, nl thryroid. Chest was hyperinflated by percussion with diminished breath sounds and moderate increased exp time without wheeze. Hoover sign positive at mid inspiration. Regular rate and rhythm without murmur gallop or rub or increase P2. No edema. Abd: no hsm, nl excursion. Ext warm without cyanosis or clubbing    cxr 03/30/12 Stable chest x-ray. No active lung disease. Stable mild  cardiomegaly.  Assessment & Plan:

## 2012-05-21 NOTE — Progress Notes (Signed)
PFT done today. 

## 2012-05-21 NOTE — Patient Instructions (Addendum)
Stop toprol and start bystolic 10 mg one daily (toprol blocks the full effect dulera)  Please schedule a follow up office visit in 4 weeks, sooner if needed  -late add consider Breo next

## 2012-05-21 NOTE — Assessment & Plan Note (Signed)
-   PFT's 11/15/05 FEV1 46% ratio 60 with 25% response to B2  - PFT's 01/25/06 FEV1 61%, ratio 46%, no better after B2  - PFT's 05/21/2012 FEV1  1.30 (50%) ratio 52 and no change p B2 and DLCO 66% corrects to 81 - HFA 75% p coaching 02/20/2012  > trial of dulera 200 2bid > 90% 05/21/2012   GOLD III and doing better with hfa and appears compliant with difficult to control symptoms which appear to respond to dulera in a dose dep fashion so this suggests possible beta blocker interference > Strongly prefer in this setting: Bystolic, the most beta -1  selective Beta blocker available in sample form, with bisoprolol the most selective generic choice  on the market (see hbp)

## 2012-05-21 NOTE — Assessment & Plan Note (Signed)
Need to try dose with bisoprolol to see if any improvement in dulera responsiveness, if not fine to continue toprol.  See instructions for specific recommendations which were reviewed directly with the patient who was given a copy with highlighter outlining the key components.

## 2012-06-15 ENCOUNTER — Encounter: Payer: Self-pay | Admitting: Internal Medicine

## 2012-06-21 ENCOUNTER — Encounter: Payer: Self-pay | Admitting: Internal Medicine

## 2012-06-21 ENCOUNTER — Ambulatory Visit (INDEPENDENT_AMBULATORY_CARE_PROVIDER_SITE_OTHER): Payer: Medicare Other | Admitting: Internal Medicine

## 2012-06-21 VITALS — BP 110/64 | HR 60 | Temp 98.3°F | Ht 65.0 in | Wt 191.6 lb

## 2012-06-21 DIAGNOSIS — I10 Essential (primary) hypertension: Secondary | ICD-10-CM

## 2012-06-21 DIAGNOSIS — J449 Chronic obstructive pulmonary disease, unspecified: Secondary | ICD-10-CM

## 2012-06-21 MED ORDER — MOMETASONE FURO-FORMOTEROL FUM 200-5 MCG/ACT IN AERO: INHALATION_SPRAY | RESPIRATORY_TRACT | Status: DC

## 2012-06-21 MED ORDER — TIOTROPIUM BROMIDE MONOHYDRATE 18 MCG IN CAPS: 18.0000 ug | ORAL_CAPSULE | Freq: Every day | RESPIRATORY_TRACT | Status: DC

## 2012-06-21 NOTE — Patient Instructions (Addendum)
If you are satisfied with your treatment plan let your doctor know and he/she can either refill your medications or you can return here when your prescription runs out.     If in any way you are not 100% satisfied,  please tell us.  If 100% better, tell your friends!   Pulmonary follow up is as needed.

## 2012-06-21 NOTE — Progress Notes (Signed)
Subjective:    Patient ID: Edward [name], male    DOB: 1929/12/26   MRN: [id number]  HPI  Brief patient profile:  82yowm who quit smoking in 1998 with an FEV1 of 61% (GOLD II) recorded in August of 2007 at wt low 190s     June 19, 2008 ov to refill spiriva using primatene after exerts for faster recovery. no increase doe, cough or tendency to exacerbations. ex tol = > moderate adl's.  rec no change unless rescue need rises  June 22, 2009 Yearly followup. Pt states that his breathing is not limiting him but he is relatively sedentary. rec continue spriva   May 17, 2010 ov cc sob worse since ran out of primatene, apparenlty using this much more than reported at last ov. no purulent sputum.but coughing more now  rec Start Advair 250 / 50 one twice daily and rinse and gargle but cough gets worse ok to stop it  Prilosec before bfast and pepcid 20 mg at bedtime as long as coughing ( reflux is to cough what oxygen is to fire)  GERD (REFLUX) diet     09/20/2010 ov/  Wert cc increase hoarse, throat congestion, thinks it's his allergies. Dry cough, not waking him up at night and better overall ex tol, never needs primatene anymore. rec  Try the symbicort 160 Take 2 puffs first thing in am and then another 2 puffs about 12 hours later.  Work on inhaler technique Did not like the delivery system on symbicort > back on advair  06/13/2011 f/u ov/Wert cc no change doe, not very limited but not very active, no purulent sputum, not really limited from desired activities rec High doses of lopressor (Toprol/ metaprolol) can potentially block the advair benefits and both your blood pressure and pulse are very low today so I recommend you speak to Dr Donnie Aho at your convenience to adjust this medication to the lowest effective dose or consider alternatives (bisoprolol and bystolic which don't  Block the effects of advair). Pulmonary follow up can be as needed  02/20/2012 f/u ov/Wert cc breathing  variably worse assoc with worse hoarseness on advair. rec Work on inhaler technique:       Stop advair and fish oil Start dulera 200 Take 2 puffs first thing in am and then another 2 puffs about 12 hours later       05/21/2012 f/u ov/Wert cc no change  doe x mailbox and back fast and feels the dulera runs out after 10 h.  rec Stop toprol and start bystolic 10 mg one daily (toprol blocks the full effect dulera)   06/21/2012 f/u ov/Wert cc sob much better even 10 h p rx with dulera, attributes to change in B blocker, not using saba hfa at all.   No obvious daytime variabilty or assoc chronic cough or cp or chest tightness, subjective wheeze overt sinus or hb symptoms. No unusual exp hx or h/o childhood pna/ asthma or premature birth to his knowledge.     Sleeping ok without nocturnal  or early am exacerbation  of respiratory  c/o's or need for noct saba. Also denies any obvious fluctuation of symptoms with weather or environmental changes or other aggravating or alleviating factors except as outlined above   ROS  The following are not active complaints unless bolded sore throat, dysphagia, dental problems, itching, sneezing,  nasal congestion or excess/ purulent secretions, ear ache,   fever, chills, sweats, unintended wt loss, pleuritic or exertional cp, hemoptysis,  orthopnea pnd or leg swelling, presyncope, palpitations, heartburn, abdominal pain, anorexia, nausea, vomiting, diarrhea  or change in bowel or urinary habits, change in stools or urine, dysuria,hematuria,  rash, arthralgias, visual complaints, headache, numbness weakness or ataxia or problems with walking or coordination,  change in mood/affect or memory.            Past Medical History:  Hypertension  COPD  - PFT's 11/15/05 FEV1 46% ratio 60 with 25% response to B2  - PFT's 01/25/06 FEV1 61%, ratio 46%, no better after B2  - Add on Advair May 18, 2010 > improved June 29, 2010  Hyperlipidemia  Gout    Depression  CAD - s/p CABG '89, redo '00  Dyslipidemia  chronic LBBB  diastolic CHF  PVD s/p B CEA  MD roster:  pulm - wert  card - tilley             Objective:   Physical Exam Pot bellied hoarse wm nad     wt   185 June 19, 2008 >   > 194 June 29, 2010  > 09/20/2010  189    > 02/20/2012  195 >   05/21/2012 194 > 06/21/2012 191  HEENT mild turbinate edema. Oropharynx no thrush or excess pnd or cobblestoning. No JVD or cervical adenopathy. Mild accessory muscle hypertrophy. Trachea midline, nl thryroid. Chest was hyperinflated by percussion with diminished breath sounds and moderate increased exp time without wheeze. Hoover sign positive at mid inspiration. Regular rate and rhythm without murmur gallop or rub or increase P2. No edema. Abd: no hsm, nl excursion. Ext warm without cyanosis or clubbing    cxr 03/30/12 Stable chest x-ray. No active lung disease. Stable mild  cardiomegaly.  Assessment & Plan:

## 2012-06-22 NOTE — Assessment & Plan Note (Signed)
Strongly prefer in this setting: Bystolic, the most beta -1  selective Beta blocker available in sample form, with bisoprolol the most selective generic choice  on the market.  

## 2012-07-17 ENCOUNTER — Encounter: Payer: Self-pay | Admitting: Internal Medicine

## 2012-09-24 ENCOUNTER — Telehealth: Payer: Self-pay | Admitting: Internal Medicine

## 2012-09-24 MED ORDER — TIOTROPIUM BROMIDE MONOHYDRATE 18 MCG IN CAPS: 18.0000 ug | ORAL_CAPSULE | Freq: Every day | RESPIRATORY_TRACT | Status: DC

## 2012-09-24 MED ORDER — MOMETASONE FURO-FORMOTEROL FUM 200-5 MCG/ACT IN AERO: INHALATION_SPRAY | RESPIRATORY_TRACT | Status: DC

## 2012-09-24 NOTE — Telephone Encounter (Signed)
Refills sent to [facility]. Pt is aware. Carron Curie, CMA

## 2012-10-01 ENCOUNTER — Encounter: Payer: Self-pay | Admitting: Cardiology

## 2012-10-01 LAB — LIPID PANEL
HDL: 40 mg/dL (ref 35–70)
LDL Cholesterol: 69 mg/dL
Triglycerides: 84 mg/dL (ref 40–160)

## 2012-10-01 NOTE — Progress Notes (Unsigned)
Patient ID: Edward [name], male   DOB: Jun 29, 1929, 79 y.o.   MRN: [id number] Edward, [name]  Date of visit:  10/01/2012 DOB:  10-05-1929    Age:  79 yrs. Medical record number:  [id number]     Account number:  [id number] Primary Care Provider: [facility] ____________________________ CURRENT DIAGNOSES  1. Dyspnea  2. CAD,Native  3. Left bundle branch hemiblock  4. Hypertension,Essential (Benign)  5. Hyperlipidemia  6. Peripheral Vascular Disease  7. Chronic Kidney Disease (Stage 3)  8. MI-S/P Inferior  9. Gout  10. Carotid artery stenosis  11. Aortic Valve Disorder  12. COPD  13. Surgery-Aortocoronary Bypass Grafting ____________________________ ALLERGIES  ACE Inhibitors, Cough-non-productive  Biaxin  codeine sulfate ____________________________ MEDICATIONS  1. multivitamin tablet, 3x week  2. aspirin 81 mg tablet, effervescent, 1 p.o. q.d.  3. Spiriva with HandiHaler 18 mcg capsule, w/inhalation device, 1 p.o. daily  4. temazepam 15 mg Capsule, QHS  5. Colcrys 0.6 mg tablet, PRN  6. furosemide 20 mg tablet, PRN swelling  7. Micardis 40 mg tablet, 1 p.o. daily  8. nitroglycerin 0.4 mg tablet, sublingual, PRN  9. Dulera 200-5 mcg/actuation HFA aerosol inhaler, 2 puff bid  10. Crestor 20 mg tablet, 1 p.o. daily  11. Plavix 75 mg tablet, 1 p.o. daily  12. bisoprolol fumarate 10 mg tablet, 1/2 tab daily ____________________________ CHIEF COMPLAINTS  Followup of CAD,Native  Followup of Hyperlipidemia ____________________________ HISTORY OF PRESENT ILLNESS  Patient seen for cardiac followup. He has been doing relatively well since he was previously here. He denies anginal pain but has had a modest amount of dyspnea. He saw Dr. Sherene Sires who took him off of metoprolol and changed him to bisoprolol. He thinks his dyspnea may have improved somewhat since then. He has some claudication. He had a carotid Doppler study that showed an occluded right carotid and a previous left carotid  subclavian bypass. He had minimal disease in the carotid on the left. His previous work was done prior to the records that we have and is not present in appendectomy. He has no symptoms of TIA. He has no PND or orthopnea. His lipids were checked and show excellent control. ____________________________ PAST HISTORY  Past Medical Illnesses:  hypertension, hyperlipidemia, peripheral vascular disease, COPD, history of shingles, gout, GERD;  Cardiovascular Illnesses:  diastolic CHF, CAD, S/P MI-inferior, conduction disorder-LBBB;  Surgical Procedures:  redo CABG w SVG to OM, SVG to RCA8/10/00 Dr. Laneta Simmers, CABG w LIMA to LAD, SVG to dx, OM, RCA 1989 Dr. Edwyna Shell, carotid endarterectomy-bil, cholecystectomy, hemorrhoidectomy, L carotid subclavian bypass, repair of anal fissure, AAA repair, R Shoulder surgery x2;  NYHA Classification:  II;  Canadian Angina Classification:  Class 0: Asymptomatic;  Cardiology Procedures-Invasive:  cardiac cath (left) 2000;  Cardiology Procedures-Noninvasive:  echocardiogram January 2008, adenosine cardiolte March 2008, echocardiogram June 2011, treadmill cardiolite June 2011, echocardiogram November 2013;  Cardiac Cath Results:  normal Left main, occluded LAD, occluded CFX, occluded RCA, occluded Diag 1 SVG, occluded RCA SVG, 99% stenosis CFX SVG, widely patent LAD LIMA graft;  LVEF of 48% documented via nuclear study on 11/26/2009,   ____________________________ CARDIO-PULMONARY TEST DATES EKG Date:  04/05/2011;   Cardiac Cath Date:  05/10/2007;  CABG: 01/07/1999;  Nuclear Study Date:  11/26/2009;  Echocardiography Date: 04/04/2012;  Chest Xray Date: 03/30/2012;   ____________________________ SOCIAL HISTORY Alcohol Use:  beer;  Smoking:  used to smoke but quit 1998, greater than 50 pack year history;  Diet:  regular diet without modifications;  Lifestyle:  widower;  Exercise:  some exercise;  Occupation:  retired Therapist, music;  Residence:  lives with son;    ____________________________ REVIEW OF SYSTEMS General:  malaise and fatigue, weight loss of approximately 10 lbs Eyes: wears eye glasses/contact lenses, cataracts Respiratory: mild dyspnea with exertion Cardiovascular:  please review HPI Abdominal: denies dyspepsia, GI bleeding, constipation, or diarrhea Genitourinary-Male: frequency, hesitancy, erectile dysfunction  Musculoskeletal:  arthritis of the right shoulder  ____________________________ PHYSICAL EXAMINATION VITAL SIGNS  Blood Pressure:  80/50 Sitting, Left arm, regular cuff  , 84/50 Standing, Left arm and regular cuff   Pulse:  64/min. Weight:  183.00 lbs. Height:  70"BMI: 26  Constitutional:  pleasant white male in no acute distress Skin:  warm and dry to touch, no apparent skin lesions, or masses noted. Head:  normocephalic, normal hair pattern, no masses or tenderness ENT:  ears, nose and throat reveal no gross abnormalities.  Dentition good. Neck:  bilateral carotid endarterectomy scars, no JVD, no bruits, no masses, non-tender Chest:  clear to auscultation and percussion, healed median sternotomy scar Cardiac:  regular rhythm, normal S1 and S2, no S3 or S4, grade 1/6 systolic murmur at aortic area radiating to neck Peripheral Pulses:  bilateral femoral bruits present, femoral pulses 2+, dorsalis pedis pulses diminished, posterior tibial pulses diminished Extremities & Back:  well healed saphenous vein donor site RLE, well healed saphenous vein donor site LLE, no edema present Neurological:  no gross motor or sensory deficits noted, affect appropriate, oriented x3. ____________________________ MOST RECENT LIPID PANEL 10/01/12  CHOL TOTL 125 mg/dl, LDL 69 calc, HDL 40 mg/dl, TRIGLYCER 84 mg/dl and CHOL/HDL 3.2 (Calc) ____________________________ IMPRESSIONS/PLAN   1. Coronary artery disease with previous redo bypass grafting 2. Previous cerebrovascular disease with carotid endarterectomy and occluded right common carotid  artery 3. COPD 4. Peripheral vascular disease 5. Hyperlipidemia currently at goal  Recommendations:  He has lost about 10 pounds of weight. The reason for this is unclear. I've asked him to have a vascular surgery followup. I am unable to find his previous old carotid results at the vascular surgeon's office or in EPIC except for the most recent one. He does have an occluded right common carotid artery but I doubt there is nothing much that can be done for this. I will see him in followup in 6 months. ____________________________ TODAYS ORDERS  1. Vasc Surgery Consult: Schedule ASAP  2. Return Visit: 6 months  3. 12 Lead EKG: 6 months                       ____________________________ Cardiology Physician:  [name] MD [facility]

## 2012-10-02 ENCOUNTER — Other Ambulatory Visit: Payer: Self-pay | Admitting: *Deleted

## 2012-10-03 ENCOUNTER — Other Ambulatory Visit: Payer: Self-pay | Admitting: *Deleted

## 2012-10-03 DIAGNOSIS — I70219 Atherosclerosis of native arteries of extremities with intermittent claudication, unspecified extremity: Secondary | ICD-10-CM

## 2012-10-29 ENCOUNTER — Encounter: Payer: Self-pay | Admitting: Vascular Surgery

## 2012-10-30 ENCOUNTER — Encounter (INDEPENDENT_AMBULATORY_CARE_PROVIDER_SITE_OTHER): Payer: Medicare Other | Admitting: *Deleted

## 2012-10-30 ENCOUNTER — Encounter: Payer: Self-pay | Admitting: Internal Medicine

## 2012-10-30 ENCOUNTER — Ambulatory Visit (INDEPENDENT_AMBULATORY_CARE_PROVIDER_SITE_OTHER): Payer: Medicare Other | Admitting: Internal Medicine

## 2012-10-30 ENCOUNTER — Ambulatory Visit (INDEPENDENT_AMBULATORY_CARE_PROVIDER_SITE_OTHER): Payer: Medicare Other | Admitting: Vascular Surgery

## 2012-10-30 ENCOUNTER — Encounter: Payer: Self-pay | Admitting: Vascular Surgery

## 2012-10-30 VITALS — BP 123/53 | HR 55 | Ht 65.0 in | Wt 183.0 lb

## 2012-10-30 VITALS — BP 128/82 | HR 57 | Temp 97.7°F | Wt 182.0 lb

## 2012-10-30 DIAGNOSIS — J449 Chronic obstructive pulmonary disease, unspecified: Secondary | ICD-10-CM

## 2012-10-30 DIAGNOSIS — I70219 Atherosclerosis of native arteries of extremities with intermittent claudication, unspecified extremity: Secondary | ICD-10-CM

## 2012-10-30 DIAGNOSIS — R634 Abnormal weight loss: Secondary | ICD-10-CM

## 2012-10-30 DIAGNOSIS — I10 Essential (primary) hypertension: Secondary | ICD-10-CM

## 2012-10-30 DIAGNOSIS — E785 Hyperlipidemia, unspecified: Secondary | ICD-10-CM

## 2012-10-30 NOTE — Progress Notes (Signed)
  Subjective:    Patient ID: Edward [name], male    DOB: 1930-03-27, 79 y.o.   MRN: [id number]  HPI  Here for follow up - reviewed chronic medical issues: CAD (CABG), PVD (CEA), COPD, insomnia  Past Medical History  Diagnosis Date  . Hypertension   . Hyperlipidemia   . Gout   . Depressed   . CAD (coronary artery disease)     s/p CABG '89, redo '00  . Dyslipidemia   . LBBB (left bundle branch block)   . Diastolic CHF, acute   . PVD (peripheral vascular disease)     s/p B CEA  . COPD (chronic obstructive pulmonary disease)      PFT 6.19.07: FEV1 465  ratio 60 with 25% response to B2.  > PFTs 8.29.07: FEV1 61%  ratio 46% no better after B2.  > add on advair 12.20.11-improved 1.31.12  . AAA (abdominal aortic aneurysm)   . Arthritis   . Melanosis coli   . Internal hemorrhoids     Review of Systems Constitutional: Negative for fever.  Respiratory: Negative for cough and shortness of breath.   Cardiovascular: Negative for chest pain or palpitations.      Objective:   Physical Exam  BP 128/82  Pulse 57  Temp(Src) 97.7 F (36.5 C) (Oral)  Wt 182 lb (82.555 kg)  BMI 30.29 kg/m2  SpO2 99% Wt Readings from Last 3 Encounters:  10/30/12 182 lb (82.555 kg)  06/21/12 191 lb 9.6 oz (86.909 kg)  05/21/12 194 lb (87.998 kg)   Constitutional: He appears well-developed and well-nourished. No distress.  Neck: Normal range of motion. Neck supple. No JVD present. No thyromegaly present.  Cardiovascular: Normal rate, regular rhythm and normal heart sounds.  No murmur heard. Pulmonary/Chest: Effort normal and breath sounds normal. No respiratory distress. no wheezes.    Lab Results  Component Value Date   CHOL 125 10/01/2012   TRIG 84 10/01/2012   HDL 40 10/01/2012        Assessment & Plan:   See problem list. Medications and labs reviewed today.  Weight loss, unintentional - pt denies symptoms related to same offered labs to eval for potential medical cause and pt declines  same Will monitor diet intake for next 6 mo and plan labs if continued loss trend next 6 mo

## 2012-10-30 NOTE — Assessment & Plan Note (Signed)
Follows with pulmonary for same Current symptoms controlled The current medical regimen is effective;  continue present plan and medications.

## 2012-10-30 NOTE — Patient Instructions (Signed)
It was good to see you today. We have reviewed your prior records including labs and tests today Medications reviewed and updated, no changes recommended at this time. Watch your weight and intake Over the next 6 months - If continued unintentional weight loss continues, we'll plan to check other labs at your next visit Please schedule followup in 6 months, call sooner if problems.

## 2012-10-30 NOTE — Progress Notes (Signed)
Vascular and Vein Specialist of [facility]   Patient name: Edward [name] MRN: [id number] DOB: 1929-08-03 Sex: male   Referred by: Dr Donnie Aho  Reason for referral:  Chief Complaint  Patient presents with  . New Evaluation    claudication - Dr. Donnie Aho    HISTORY OF PRESENT ILLNESS: Patient presents today for evaluation of diffuse peripheral vascular occlusive disease. No him from a prior resection of abdominal aortic aneurysm in 1998. He has had staged bilateral carotid endarterectomy with Dr. Edwyna Shell in 1992. Also had coronary artery bypass grafting by Dr. Edwyna Shell in 1989. He had done well since his aortic surgery. He presents today for evaluation of some nonlimiting claudication of his right leg. He is active at his age of 37 but does report that calf tightness and cramping and aching with walking which is relieved with rest. He does not have any rest pain. He has been stable from a coronary standpoint since redo bypass in 2000  Past Medical History  Diagnosis Date  . Hypertension   . Hyperlipidemia   . Gout   . Depressed   . CAD (coronary artery disease)     s/p CABG '89, redo '00  . Dyslipidemia   . LBBB (left bundle branch block)   . Diastolic CHF, acute   . PVD (peripheral vascular disease)     s/p B CEA  . COPD (chronic obstructive pulmonary disease)      PFT 6.19.07: FEV1 465  ratio 60 with 25% response to B2.  > PFTs 8.29.07: FEV1 61%  ratio 46% no better after B2.  > add on advair 12.20.11-improved 1.31.12  . AAA (abdominal aortic aneurysm)   . Arthritis   . Melanosis coli   . Internal hemorrhoids   . Carotid artery occlusion   . Aortic valve disorder     Past Surgical History  Procedure Laterality Date  . Coronary artery bypass graft  1989, 2000     Bartle  . Cholecystectomy    . Shoulder surgery      right x 2 - Handy  . Carotid endarterectomy    . Abdominal aortic aneurysm repair  1998    History   Social History  . Marital Status: Widowed    Spouse  Name: N/A    Number of Children: N/A  . Years of Education: N/A   Occupational History  . retired from [employer]    Social History Main Topics  . Smoking status: Former Smoker -- 1.50 packs/day for 40 years    Types: Cigarettes    Quit date: 05/30/1996  . Smokeless tobacco: Never Used  . Alcohol Use: No     Comment: occaisonal beer  . Drug Use: No  . Sexually Active: Not on file   Other Topics Concern  . Not on file   Social History Narrative   Lives alone    Family History  Problem Relation Age of Onset  . Heart disease Mother   . Colon cancer Neg Hx     Allergies as of 10/30/2012 - Review Complete 10/30/2012  Allergen Reaction Noted  . Clarithromycin    . Codeine    . Oxycodone-acetaminophen      Current Outpatient Prescriptions on File Prior to Visit  Medication Sig Dispense Refill  . aspirin 81 MG tablet Take 81 mg by mouth daily.        . bisoprolol (ZEBETA) 10 MG tablet Take 1 tablet (10 mg total) by mouth daily.  30 tablet  11  .  clopidogrel (PLAVIX) 75 MG tablet Take 75 mg by mouth daily.        . colchicine 0.6 MG tablet Take 0.6 mg by mouth as needed.      . mometasone-formoterol (DULERA) 200-5 MCG/ACT AERO Take 2 puffs first thing in am and then another 2 puffs about 12 hours later.  3 Inhaler  3  . rosuvastatin (CRESTOR) 20 MG tablet Take 20 mg by mouth daily.        Marland Kitchen telmisartan (MICARDIS) 40 MG tablet Take 40 mg by mouth daily.        . temazepam (RESTORIL) 15 MG capsule take 1 capsule by mouth at bedtime if needed for sleep  30 capsule  5  . tiotropium (SPIRIVA) 18 MCG inhalation capsule Place 1 capsule (18 mcg total) into inhaler and inhale daily.  90 capsule  3  . Polyethylene Glycol 3350 (MIRALAX PO) Take by mouth as directed.       No current facility-administered medications on file prior to visit.     REVIEW OF SYSTEMS:  Positives indicated with an "X"  CARDIOVASCULAR:  [ ]  chest pain   [ ]  chest pressure   [ ]  palpitations   [ ]   orthopnea   [ ]  dyspnea on exertion   [ ]  claudication   [ ]  rest pain   [ ]  DVT   [ ]  phlebitis PULMONARY:   [ ]  productive cough   [ ]  asthma   [ ]  wheezing NEUROLOGIC:   [ ]  weakness  [ ]  paresthesias  [ ]  aphasia  [ ]  amaurosis  [ ]  dizziness HEMATOLOGIC:   [ ]  bleeding problems   [ ]  clotting disorders MUSCULOSKELETAL:  [ ]  joint pain   [ ]  joint swelling GASTROINTESTINAL: [ ]   blood in stool  [ ]   hematemesis GENITOURINARY:  [ ]   dysuria  [ ]   hematuria PSYCHIATRIC:  [ ]  history of major depression INTEGUMENTARY:  [ ]  rashes  [ ]  ulcers CONSTITUTIONAL:  [ ]  fever   [ ]  chills  PHYSICAL EXAMINATION:  General: The patient is a well-nourished male, in no acute distress. Vital signs are BP 123/53  Pulse 55  Ht 5\' 5"  (1.651 m)  Wt 183 lb (83.008 kg)  BMI 30.45 kg/m2  SpO2 100% Pulmonary: There is a good air exchange bilaterally without wheezing or rales. Abdomen: Soft and non-tender with normal pitch bowel sounds. Well-healed midline incision with probable small ventral hernia at the level of the umbilicus Musculoskeletal: There are no major deformities.  There is no significant extremity pain. Neurologic: No focal weakness or paresthesias are detected, Skin: There are no ulcer or rashes noted. Psychiatric: The patient has normal affect. Cardiovascular: There is a regular rate and rhythm without significant murmur appreciated. Bilateral carotid incision is well-healed with no bruits Pulse status: 2+ radial 2+ femoral pulses. He has an absent popliteal pulse on the right and absent pedal pulse on the right he has a 2+ popliteal pulse and dorsalis pedis pulse on the   [facility] Vascular Lab Studies:  Ordered and Independently Reviewed carotid duplex from November 2013 reveals no occluded the common and internal carotid artery. Left endarterectomy is widely patent  Lower extremity studies today reveal the ankle arm index of 0.75 on the right and normal at 1.0 on the left  Impression and  Plan:  Diffuse peripheral vascular occlusive disease. These are all stable. He has a symptomatic chronic occlusion of his right internal carotid artery in  a discussed this with the patient. He also has a probable SFA stenosis or occlusion. He is comfortable with this level of claudication. Extending the importance of good foot care. He is comfortable with observation only and will notify should he develop any worsening symptoms    EARLY, TODD [facility] [place] Office: [phone]

## 2012-10-30 NOTE — Assessment & Plan Note (Signed)
On statin - follows labs at cards for same Edward [name]), last set reviewed: at goal The current medical regimen is effective;  continue present plan and medications.

## 2012-10-30 NOTE — Assessment & Plan Note (Signed)
BP Readings from Last 3 Encounters:  10/30/12 128/82  06/21/12 110/64  05/21/12 116/74   The current medical regimen is effective;  continue present plan and medications

## 2012-11-05 ENCOUNTER — Other Ambulatory Visit: Payer: Self-pay | Admitting: Internal Medicine

## 2012-11-05 NOTE — Telephone Encounter (Signed)
Done hardcopy to lucy 

## 2012-11-05 NOTE — Telephone Encounter (Signed)
Faxed script bck to [facility].../lmb 

## 2012-12-18 ENCOUNTER — Inpatient Hospital Stay (HOSPITAL_COMMUNITY)
Admission: AD | Admit: 2012-12-18 | Discharge: 2012-12-22 | DRG: 243 | Disposition: A | Discharge status: Home / Self Care | Payer: Medicare Other | Source: Ambulatory Visit | Attending: Cardiology | Admitting: Cardiology

## 2012-12-18 ENCOUNTER — Observation Stay (HOSPITAL_COMMUNITY): Payer: Medicare Other

## 2012-12-18 ENCOUNTER — Encounter (HOSPITAL_COMMUNITY): Payer: Self-pay | Admitting: Cardiology

## 2012-12-18 DIAGNOSIS — Z950 Presence of cardiac pacemaker: Secondary | ICD-10-CM

## 2012-12-18 DIAGNOSIS — N183 Chronic kidney disease, stage 3 unspecified: Secondary | ICD-10-CM | POA: Diagnosis present

## 2012-12-18 DIAGNOSIS — E785 Hyperlipidemia, unspecified: Secondary | ICD-10-CM | POA: Diagnosis present

## 2012-12-18 DIAGNOSIS — Z951 Presence of aortocoronary bypass graft: Secondary | ICD-10-CM

## 2012-12-18 DIAGNOSIS — I442 Atrioventricular block, complete: Principal | ICD-10-CM | POA: Diagnosis present

## 2012-12-18 DIAGNOSIS — Z7982 Long term (current) use of aspirin: Secondary | ICD-10-CM

## 2012-12-18 DIAGNOSIS — E875 Hyperkalemia: Secondary | ICD-10-CM | POA: Diagnosis not present

## 2012-12-18 DIAGNOSIS — Z7902 Long term (current) use of antithrombotics/antiplatelets: Secondary | ICD-10-CM

## 2012-12-18 DIAGNOSIS — I6529 Occlusion and stenosis of unspecified carotid artery: Secondary | ICD-10-CM | POA: Diagnosis present

## 2012-12-18 DIAGNOSIS — Z79899 Other long term (current) drug therapy: Secondary | ICD-10-CM

## 2012-12-18 DIAGNOSIS — I130 Hypertensive heart and chronic kidney disease with heart failure and stage 1 through stage 4 chronic kidney disease, or unspecified chronic kidney disease: Secondary | ICD-10-CM | POA: Diagnosis present

## 2012-12-18 DIAGNOSIS — I447 Left bundle-branch block, unspecified: Secondary | ICD-10-CM | POA: Diagnosis present

## 2012-12-18 DIAGNOSIS — M109 Gout, unspecified: Secondary | ICD-10-CM | POA: Diagnosis present

## 2012-12-18 DIAGNOSIS — I2510 Atherosclerotic heart disease of native coronary artery without angina pectoris: Secondary | ICD-10-CM | POA: Diagnosis present

## 2012-12-18 DIAGNOSIS — I5032 Chronic diastolic (congestive) heart failure: Secondary | ICD-10-CM | POA: Diagnosis present

## 2012-12-18 DIAGNOSIS — J449 Chronic obstructive pulmonary disease, unspecified: Secondary | ICD-10-CM | POA: Diagnosis present

## 2012-12-18 DIAGNOSIS — F411 Generalized anxiety disorder: Secondary | ICD-10-CM | POA: Diagnosis present

## 2012-12-18 DIAGNOSIS — Z87891 Personal history of nicotine dependence: Secondary | ICD-10-CM

## 2012-12-18 DIAGNOSIS — Z95810 Presence of automatic (implantable) cardiac defibrillator: Secondary | ICD-10-CM

## 2012-12-18 DIAGNOSIS — J4489 Other specified chronic obstructive pulmonary disease: Secondary | ICD-10-CM | POA: Diagnosis present

## 2012-12-18 DIAGNOSIS — I252 Old myocardial infarction: Secondary | ICD-10-CM

## 2012-12-18 DIAGNOSIS — I441 Atrioventricular block, second degree: Secondary | ICD-10-CM | POA: Diagnosis present

## 2012-12-18 DIAGNOSIS — I359 Nonrheumatic aortic valve disorder, unspecified: Secondary | ICD-10-CM | POA: Diagnosis present

## 2012-12-18 DIAGNOSIS — N189 Chronic kidney disease, unspecified: Secondary | ICD-10-CM | POA: Diagnosis present

## 2012-12-18 DIAGNOSIS — Z9861 Coronary angioplasty status: Secondary | ICD-10-CM

## 2012-12-18 HISTORY — DX: Depression, unspecified: F32.A

## 2012-12-18 HISTORY — DX: Renal agenesis, unspecified: Q60.2

## 2012-12-18 HISTORY — DX: Atherosclerotic heart disease of native coronary artery without angina pectoris: I25.10

## 2012-12-18 HISTORY — DX: Chronic kidney disease, stage 3 (moderate): N18.3

## 2012-12-18 HISTORY — DX: Hypertensive heart disease without heart failure: I11.9

## 2012-12-18 HISTORY — DX: Hyperlipidemia, unspecified: E78.5

## 2012-12-18 HISTORY — DX: Major depressive disorder, single episode, unspecified: F32.9

## 2012-12-18 HISTORY — DX: Benign prostatic hyperplasia without lower urinary tract symptoms: N40.0

## 2012-12-18 HISTORY — DX: Chronic kidney disease, stage 3 unspecified: N18.30

## 2012-12-18 HISTORY — DX: Atrioventricular block, second degree: I44.1

## 2012-12-18 HISTORY — DX: Acute myocardial infarction, unspecified: I21.9

## 2012-12-18 LAB — COMPREHENSIVE METABOLIC PANEL
CO2: 22 mEq/L (ref 19–32)
Calcium: 9.4 mg/dL (ref 8.4–10.5)
Creatinine, Ser: 2.43 mg/dL — ABNORMAL HIGH (ref 0.50–1.35)
GFR calc Af Amer: 27 mL/min — ABNORMAL LOW (ref >90)
GFR calc non Af Amer: 23 mL/min — ABNORMAL LOW (ref >90)
Glucose, Bld: 89 mg/dL (ref 70–99)
Total Protein: 6.7 g/dL (ref 6.0–8.3)

## 2012-12-18 LAB — CBC WITH DIFFERENTIAL/PLATELET
HCT: 34.9 % — ABNORMAL LOW (ref 39.0–52.0)
Hemoglobin: 11.7 g/dL — ABNORMAL LOW (ref 13.0–17.0)
Lymphocytes Relative: 20 % (ref 12–46)
Lymphs Abs: 1.6 10*3/uL (ref 0.7–4.0)
Monocytes Relative: 12 % (ref 3–12)
Neutro Abs: 5 10*3/uL (ref 1.7–7.7)
Neutrophils Relative %: 64 % (ref 43–77)
RBC: 3.72 MIL/uL — ABNORMAL LOW (ref 4.22–5.81)

## 2012-12-18 LAB — PROTIME-INR
INR: 1.1 (ref 0.00–1.49)
Prothrombin Time: 14 seconds (ref 11.6–15.2)

## 2012-12-18 LAB — TSH: TSH: 1.589 u[IU]/mL (ref 0.350–4.500)

## 2012-12-18 MED ORDER — TIOTROPIUM BROMIDE MONOHYDRATE 18 MCG IN CAPS
18.0000 ug | ORAL_CAPSULE | Freq: Every day | RESPIRATORY_TRACT | Status: DC
  Administered 2012-12-19 – 2012-12-22 (×2): 18 ug via RESPIRATORY_TRACT
  Filled 2012-12-18 (×3): qty 5

## 2012-12-18 MED ORDER — CLOPIDOGREL BISULFATE 75 MG PO TABS
75.0000 mg | ORAL_TABLET | Freq: Every day | ORAL | Status: DC
  Administered 2012-12-18 – 2012-12-22 (×4): 75 mg via ORAL
  Filled 2012-12-18 (×4): qty 1

## 2012-12-18 MED ORDER — NITROGLYCERIN 0.4 MG SL SUBL: 0.4000 mg | SUBLINGUAL_TABLET | SUBLINGUAL | Status: DC | PRN

## 2012-12-18 MED ORDER — ZOLPIDEM TARTRATE 5 MG PO TABS: 5.0000 mg | ORAL_TABLET | Freq: Every evening | ORAL | Status: DC | PRN

## 2012-12-18 MED ORDER — ATORVASTATIN CALCIUM 40 MG PO TABS
40.0000 mg | ORAL_TABLET | Freq: Every day | ORAL | Status: DC
  Administered 2012-12-19 – 2012-12-21 (×3): 40 mg via ORAL
  Filled 2012-12-18 (×5): qty 1

## 2012-12-18 MED ORDER — ACETAMINOPHEN 325 MG PO TABS
650.0000 mg | ORAL_TABLET | ORAL | Status: DC | PRN
  Administered 2012-12-20: 650 mg via ORAL
  Filled 2012-12-18: qty 2

## 2012-12-18 MED ORDER — SODIUM CHLORIDE 0.9 % IV SOLN: 250.0000 mL | INTRAVENOUS | Status: DC | PRN

## 2012-12-18 MED ORDER — ENOXAPARIN SODIUM 40 MG/0.4ML ~~LOC~~ SOLN
40.0000 mg | SUBCUTANEOUS | Status: DC
  Administered 2012-12-18: 40 mg via SUBCUTANEOUS
  Filled 2012-12-18 (×2): qty 0.4

## 2012-12-18 MED ORDER — ASPIRIN EC 81 MG PO TBEC
81.0000 mg | DELAYED_RELEASE_TABLET | Freq: Every day | ORAL | Status: DC
  Administered 2012-12-18 – 2012-12-22 (×4): 81 mg via ORAL
  Filled 2012-12-18 (×5): qty 1

## 2012-12-18 MED ORDER — SODIUM CHLORIDE 0.9 % IJ SOLN: 3.0000 mL | INTRAMUSCULAR | Status: DC | PRN

## 2012-12-18 MED ORDER — MOMETASONE FURO-FORMOTEROL FUM 200-5 MCG/ACT IN AERO
2.0000 | INHALATION_SPRAY | Freq: Two times a day (BID) | RESPIRATORY_TRACT | Status: DC
  Administered 2012-12-18 – 2012-12-22 (×8): 2 via RESPIRATORY_TRACT
  Filled 2012-12-18 (×4): qty 8.8

## 2012-12-18 MED ORDER — ASPIRIN 81 MG PO TABS: 81.0000 mg | ORAL_TABLET | Freq: Every day | ORAL | Status: DC

## 2012-12-18 MED ORDER — TEMAZEPAM 15 MG PO CAPS: 15.0000 mg | ORAL_CAPSULE | Freq: Every evening | ORAL | Status: DC | PRN

## 2012-12-18 MED ORDER — IRBESARTAN 150 MG PO TABS
150.0000 mg | ORAL_TABLET | Freq: Every day | ORAL | Status: DC
  Filled 2012-12-18 (×2): qty 1

## 2012-12-18 MED ORDER — SODIUM CHLORIDE 0.9 % IJ SOLN
3.0000 mL | Freq: Two times a day (BID) | INTRAMUSCULAR | Status: DC
  Administered 2012-12-18 – 2012-12-21 (×5): 3 mL via INTRAVENOUS

## 2012-12-18 NOTE — Progress Notes (Signed)
  Echocardiogram 2D Echocardiogram has been performed by Cathie Beams.  Edward [name] [name] 12/18/2012, 4:22 PM

## 2012-12-18 NOTE — H&P (Signed)
History and Physical   Admit date: 12/18/2012 Name:  Edward [name] Medical record number: [id number] DOB/Age:  Sep 14, 1929  79 y.o. male  Primary Physician:  Dr. Rene Paci  Chief complaint/reason for admission:  Dyspnea and weakness, near syncope  HPI:  Patient admitted to the hospital with second-degree heart block. He has an extensive vascular history with previous redo bypass grafting and also has hypertensive heart disease, severe peripheral vascular disease with claudication as well as previous bilateral carotid artery disease. He has mild aortic stenosis. He has a chronic left bundle branch block.  He was recently changed from metoprolol to bisoprolol. He has had significant weakness and difficulty with severe dyspnea on exertion. He felt as if he could barely go. While sitting in a chair yesterday lost his vision and had difficulty seeing and has had some episodic dizziness. He has not had frank syncope. He was seen in the office today and was found to be in 2:1 AV block with an effective heart rate of around 35 and somewhat low blood pressure. He is admitted to the hospital this time because the visual loss and the second-degree heart block. He has not had recent angina. He does have dyspnea as noted above and is limited with claudication.    Past Medical History  Diagnosis Date  . Hyperlipidemia   . Gout   . Depression   . CAD (coronary artery disease)     s/p CABG '89, redo '00  . Dyslipidemia   . LBBB (left bundle branch block)   . PVD (peripheral vascular disease)     s/p B CEA  . COPD (chronic obstructive pulmonary disease)      PFT 6.19.07: FEV1 465  ratio 60 with 25% response to B2.  > PFTs 8.29.07: FEV1 61%  ratio 46% no better after B2.  > add on advair 12.20.11-improved 1.31.12  . AAA (abdominal aortic aneurysm)   . Arthritis   . Melanosis coli   . Internal hemorrhoids   . Carotid artery occlusion   . Aortic valve disorder   . Chronic diastolic heart failure  03/30/2009  . CAD (coronary artery disease), native coronary artery     CABG w LIMA to LAD, SVG to dx, OM, RCA 1989 Dr. Edwyna Shell for 3VD PTCA of OM, 1999 and 2000 Cath showed occlusion of left main and RCA with stenosis in OM Redo redo CABG w SVG to OM, SVG to RCA8/10/00 Dr. Laneta Simmers   . Chronic kidney disease stage III (GFR 30-59 ml/min)   . Hyperlipidemia   . BPH (benign prostatic hypertrophy)   . Hypertensive heart disease     Change toprol to bisoprolol 10 mg daily on trial basis  05/21/2012  - notified by [facility] non adherent 07/17/2012       Past Surgical History  Procedure Laterality Date  . Coronary artery bypass graft  1989, 2000     Bartle  . Cholecystectomy    . Shoulder surgery      right x 2 - Handy  . Carotid endarterectomy Bilateral   . Abdominal aortic aneurysm repair  1998  . Carotid-subclavian bypass graft Left   . Anal fissure repair    . Hemorroidectomy      Allergies: is allergic to clarithromycin; codeine; and oxycodone-acetaminophen.   Medications: Prior to Admission medications   Medication Sig Start Date End Date Taking? Authorizing Provider  aspirin 81 MG tablet Take 81 mg by mouth daily.      Historical Provider, MD  bisoprolol (ZEBETA) 10 MG tablet Take 1 tablet (10 mg total) by mouth daily. 05/21/12   Nyoka Cowden, MD  clopidogrel (PLAVIX) 75 MG tablet Take 75 mg by mouth daily.      Historical Provider, MD  colchicine 0.6 MG tablet Take 0.6 mg by mouth as needed.    Historical Provider, MD  furosemide (LASIX) 20 MG tablet Take 20 mg by mouth as needed.    Historical Provider, MD  mometasone-formoterol (DULERA) 200-5 MCG/ACT AERO Take 2 puffs first thing in am and then another 2 puffs about 12 hours later. 09/24/12   Nyoka Cowden, MD  Multiple Vitamin (MULTIVITAMIN) tablet Take 1 tablet by mouth 3 (three) times a week.    Historical Provider, MD  nitroGLYCERIN (NITROSTAT) 0.4 MG SL tablet Place 0.4 mg under the tongue every 5 (five) minutes as needed for chest  pain.    Historical Provider, MD  Polyethylene Glycol 3350 (MIRALAX PO) Take by mouth as directed.    Historical Provider, MD  rosuvastatin (CRESTOR) 20 MG tablet Take 20 mg by mouth daily.      Historical Provider, MD  telmisartan (MICARDIS) 40 MG tablet Take 40 mg by mouth daily.      Historical Provider, MD  temazepam (RESTORIL) 15 MG capsule take 1 capsule by mouth at bedtime if needed for sleep 11/05/12   Corwin Levins, MD  tiotropium (SPIRIVA) 18 MCG inhalation capsule Place 1 capsule (18 mcg total) into inhaler and inhale daily. 09/24/12 09/24/13  Nyoka Cowden, MD    Family History:  Family Status  Relation Status Death Age  . Father Deceased     died of leukemia  . Mother Deceased     died of CAD  . Brother Deceased   . Brother Alive   . Sister Deceased     Died of trauma  . Sister Deceased   . Sister Deceased     Social History:   reports that he quit smoking about 16 years ago. His smoking use included Cigarettes. He has a 60 pack-year smoking history. He has never used smokeless tobacco. He reports that he does not drink alcohol or use illicit drugs.   History   Social History Narrative   Lives alone. Widower.     Review of Systems:   He has had involuntary weight loss of around 10 pounds this year. He has a history of cataracts and wears eyeglasses. He has significant BPH and has frequency, hesitancy and nocturia as well as erectile dysfunction. 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 He has significant limiting claudication. He has not had any significant headache. He has had some depression and anxiety since his wife died earlier. He does have significant dyspnea that limits him.  Other than as noted above, the remainder of the review of systems is normal  Physical Exam: BP 106/50  Pulse 60  Temp(Src) 98.3 F (36.8 C) (Oral)  Resp 18  SpO2 99% General appearance: pleasant elderly  male who is currently in no acute distress Head: Normocephalic, without obvious abnormality, atraumatic, balding male hair pattern Eyes: conjunctivae/corneas clear. PERRL, EOM's intact. Fundi Not examined Neck: no adenopathy, no carotid bruit, no JVD, supple, symmetrical, trachea midline and bilateral carotid endarterectomy scars noted Lungs: clear to auscultation bilaterally and increased AP diameter Heart: slow regular rhythm, normal S1 and S2, no S3, 1-2/6 systolic murmur at aortic area  Healed median  sternotomy scar noted Abdomen: midline surgical scar noted, abdomen soft and nontender without mass Rectal: deferred Extremities: no edema noted, previous saphenous vein harvesting scars of the lower extremities Pulses: femoral pulses 2+ with bruits, pedal pulses are diminished Skin: Skin color, texture, turgor normal. No rashes or lesions Neurologic: Grossly normal   Labs: pending   EKG: Second-degree AV block with left bundle branch block currently 2-1 block  Radiology: Pending at the time of admission   IMPRESSIONS: 1. Second degree AV block which is symptomatic 2. Chronic left bundle branch block 3. Coronary artery disease with previous redo bypass grafting 4. Mild aortic stenosis 5. Hypertensive heart disease 6. Chronic kidney disease stage III 7. Hyperlipidemia 8. Recent visual loss 9. Peripheral vascular disease with limiting claudication 10. Previous severe carotid artery disease with bilateral carotid endarterectomies and left carotid subclavian bypass 11. Previous abdominal aneurysm repair 12. History of chronic diastolic dysfunction.  PLAN: He will be placed on telemetry and have a repeat echo. His beta blocker will be held and he will be monitored. If a second degree AV block does not resolve he likely will need to have permanent pacemaking.   Signed: [name] MD [facility]  12/18/2012, 11:55 AM

## 2012-12-19 ENCOUNTER — Encounter (HOSPITAL_COMMUNITY): Payer: Self-pay | Admitting: Cardiology

## 2012-12-19 DIAGNOSIS — I442 Atrioventricular block, complete: Principal | ICD-10-CM

## 2012-12-19 LAB — COMPREHENSIVE METABOLIC PANEL
ALT: 12 U/L (ref 0–53)
AST: 14 U/L (ref 0–37)
Albumin: 3.5 g/dL (ref 3.5–5.2)
Alkaline Phosphatase: 78 U/L (ref 39–117)
CO2: 21 mEq/L (ref 19–32)
Chloride: 107 mEq/L (ref 96–112)
Creatinine, Ser: 2.34 mg/dL — ABNORMAL HIGH (ref 0.50–1.35)
GFR calc non Af Amer: 24 mL/min — ABNORMAL LOW (ref >90)
Potassium: 5.1 mEq/L (ref 3.5–5.1)
Sodium: 138 mEq/L (ref 135–145)
Total Bilirubin: 0.5 mg/dL (ref 0.3–1.2)

## 2012-12-19 LAB — BASIC METABOLIC PANEL WITH GFR
BUN: 39 mg/dL — ABNORMAL HIGH (ref 6–23)
CO2: 23 meq/L (ref 19–32)
Calcium: 9.1 mg/dL (ref 8.4–10.5)
Chloride: 105 meq/L (ref 96–112)
Creatinine, Ser: 2.25 mg/dL — ABNORMAL HIGH (ref 0.50–1.35)
GFR calc Af Amer: 30 mL/min — ABNORMAL LOW
GFR calc non Af Amer: 26 mL/min — ABNORMAL LOW
Glucose, Bld: 96 mg/dL (ref 70–99)
Potassium: 4.8 meq/L (ref 3.5–5.1)
Sodium: 137 meq/L (ref 135–145)

## 2012-12-19 LAB — CBC
MCV: 93.4 fL (ref 78.0–100.0)
Platelets: 136 10*3/uL — ABNORMAL LOW (ref 150–400)
RBC: 3.49 MIL/uL — ABNORMAL LOW (ref 4.22–5.81)
RDW: 13.9 % (ref 11.5–15.5)
WBC: 6.8 10*3/uL (ref 4.0–10.5)

## 2012-12-19 MED ORDER — SODIUM CHLORIDE 0.9 % IV SOLN
INTRAVENOUS | Status: DC
  Administered 2012-12-20: 05:00:00 via INTRAVENOUS

## 2012-12-19 MED ORDER — CHLORHEXIDINE GLUCONATE 4 % EX LIQD: 60.0000 mL | Freq: Once | CUTANEOUS | Status: DC

## 2012-12-19 MED ORDER — SODIUM CHLORIDE 0.9 % IR SOLN
80.0000 mg | Status: DC
  Filled 2012-12-19: qty 2

## 2012-12-19 MED ORDER — CEFAZOLIN SODIUM-DEXTROSE 2-3 GM-% IV SOLR
2.0000 g | INTRAVENOUS | Status: DC
  Filled 2012-12-19: qty 50

## 2012-12-19 MED ORDER — CHLORHEXIDINE GLUCONATE 4 % EX LIQD
60.0000 mL | Freq: Once | CUTANEOUS | Status: AC
  Administered 2012-12-19: 4 via TOPICAL

## 2012-12-19 MED ORDER — TEMAZEPAM 15 MG PO CAPS
15.0000 mg | ORAL_CAPSULE | Freq: Every evening | ORAL | Status: DC | PRN
  Administered 2012-12-19 – 2012-12-21 (×3): 15 mg via ORAL
  Filled 2012-12-19 (×3): qty 1

## 2012-12-19 MED ORDER — YOU HAVE A PACEMAKER BOOK
Freq: Once | Status: AC
  Administered 2012-12-19: 23:00:00
  Filled 2012-12-19: qty 1

## 2012-12-19 MED ORDER — CHLORHEXIDINE GLUCONATE 4 % EX LIQD
60.0000 mL | Freq: Once | CUTANEOUS | Status: AC
  Administered 2012-12-20: 4 via TOPICAL
  Filled 2012-12-19 (×2): qty 60

## 2012-12-19 NOTE — Progress Notes (Signed)
Utilization review completed.  

## 2012-12-19 NOTE — Progress Notes (Signed)
Subjective:  Remains in 2-1 block with occasional Wenckebach, not short of breath, no syncope. No chest pain. His renal function is worsened.  Objective:  Vital Signs in the last 24 hours: BP 122/28  Pulse 32  Temp(Src) 98.6 F (37 C) (Oral)  Resp 18  Ht 5' 10.5" (1.791 m)  Wt 81.829 kg (180 lb 6.4 oz)  BMI 25.51 kg/m2  SpO2 99%  Physical Exam: Pleasant white male in no acute distress Lungs:  Clear  Cardiac:  Regular rhythm, normal S1 and S2, no S3, 1-2/6 systolic murmur at aortic area Extremities:  No edema present  Intake/Output from previous day: 07/22 0701 - 07/23 0700 In: 560 [P.O.:560] Out: -  Weight Filed Weights   12/18/12 1549  Weight: 81.829 kg (180 lb 6.4 oz)    Lab Results: Basic Metabolic Panel:  Recent Labs  04/54/09 1709 12/19/12 0600  NA 139 138  K 5.7* 5.1  CL 108 107  CO2 22 21  GLUCOSE 89 86  BUN 38* 40*  CREATININE 2.43* 2.34*    CBC:  Recent Labs  12/18/12 1709 12/19/12 0600  WBC 7.8 6.8  NEUTROABS 5.0  --   HGB 11.7* 10.7*  HCT 34.9* 32.6*  MCV 93.8 93.4  PLT 126* 136*    BNP    Component Value Date/Time   PROBNP 1395.0* 12/18/2012 1708    PROTIME: Lab Results  Component Value Date   INR 1.10 12/18/2012    Telemetry: Sinus rhythm with 2-1 AV block and left bundle branch block pattern  Assessment/Plan:  1. Persistent second-degree AV block-his beta blocker last dose was Monday and possibly Sunday. He has been seen by EP. 2. Chronic kidney disease now stage IV with worsened kidney function 3. Chronic diastolic heart failure 4. Coronary artery disease with redo bypass grafting 5. Left bundle branch block  Recommendations:  Agree with holding ARB and watching renal function. Potassium is better today. Await washout of beta blocker. Will review echocardiogram.     W. Ashley Royalty  MD [facility]  12/19/2012, 8:49 AM

## 2012-12-19 NOTE — Consult Note (Signed)
ELECTROPHYSIOLOGY CONSULT NOTE    Patient ID: VAUGHAN GARFINKLE MRN: [id number], DOB/AGE: 1930/05/04 79 y.o.  Admit date: 12/18/2012 Date of Consult: 12-19-2012  Primary Physician: Rene Paci, MD Primary Cardiologist: Viann Fish, MD  Reason for Consultation: heart block  HPI:  Mr. Kopke is a 78 year old male with a past medical history significant for coronary artery disease (s/p CABG 1989 and 2000), PVD, renal insufficiency, mild aortic stenosis and LBBB.  He has had weakness and fatigue as well as pre-syncopal spells for the past 2-3 weeks.  He was seen by Dr Donnie Aho and found to be in 2:1 heart block and was admitted.  He takes Bisoprolol 10mg  daily but states that his last dose was Monday.  He is on no other AV nodal blocking medications.  Lab work is remarkable for normal TSH, K of 5.7, and renal insufficiency.  Echo is pending this admission.  He denies chest pain, shortness of breath, or palpitations.   EP has been asked to evaluate for treatment options.  ROS is negative except as outlined above.   Past Medical History  Diagnosis Date  . Hypertension   . Hyperlipidemia   . Gout     "only once in my lifetime" (12/18/2012)  . Depression   . CAD (coronary artery disease)     s/p CABG '89, redo '00  . Dyslipidemia   . PVD (peripheral vascular disease)     s/p B CEA  . COPD (chronic obstructive pulmonary disease)      PFT 6.19.07: FEV1 465  ratio 60 with 25% response to B2.  > PFTs 8.29.07: FEV1 61%  ratio 46% no better after B2.  > add on advair 12.20.11-improved 1.31.12  . AAA (abdominal aortic aneurysm)   . Melanosis coli   . Internal hemorrhoids   . Carotid artery occlusion   . Aortic valve disorder   . Chronic diastolic heart failure 03/30/2009  . CAD (coronary artery disease), native coronary artery     CABG w LIMA to LAD, SVG to dx, OM, RCA 1989 Dr. Edwyna Shell for 3VD PTCA of OM, 1999 and 2000 Cath showed occlusion of left main and RCA with stenosis in OM  Redo redo CABG w SVG to OM, SVG to RCA8/10/00 Dr. Laneta Simmers   . Hyperlipidemia   . BPH (benign prostatic hypertrophy)   . Hypertensive heart disease     Change toprol to bisoprolol 10 mg daily on trial basis  05/21/2012  - notified by [facility] non adherent 07/17/2012    . Myocardial infarction 1979  . Shortness of breath     "all the time; I'm on RX qd" (12/18/2012)  . Arthritis     "fingers" (12/18/2012)  . Chronic kidney disease stage III (GFR 30-59 ml/min)   . Congenital absence of kidney     "noted during AAA repair; never knew it before" (12/18/2012)  . LBBB (left bundle branch block)   . Second degree heart block 12/18/2012     Surgical History:  Past Surgical History  Procedure Laterality Date  . Coronary artery bypass graft  1989, 2000     "CABG X ?3; CABG X 5 w//Bartle" (12/18/2012)  . Cholecystectomy  1998  . Hemiarthroplasty shoulder fracture Right ~ 2008     x 2 - [facility]  . Carotid endarterectomy Bilateral 1990's  . Abdominal aortic aneurysm repair  1998  . Carotid-subclavian bypass graft Left   . Anal fissure repair      12/18/2012 "I don't remember  this"  . Hemorroidectomy      12/18/2012 "I don't remember this"  . Carpal tunnel release Right 1980's    "Dr. Devota Pace" (12/18/2012)  . Cardiac catheterization      "2 or 3" (12/18/2012)  . Coronary angioplasty      "a few" (12/18/2012)  . Coronary angioplasty with stent placement      "I've got 1 or 2" (12/18/2012)  . Joint replacement    . Hip fracture surgery  1944    "fell out of a tree; had it operated on 3 times" (12/18/2012)  . Refractive surgery Bilateral 1990's?     Prescriptions prior to admission  Medication Sig Dispense Refill  . aspirin 81 MG tablet Take 81 mg by mouth daily.        . clopidogrel (PLAVIX) 75 MG tablet Take 75 mg by mouth daily.        . colchicine 0.6 MG tablet Take 0.6 mg by mouth as needed.      . mometasone-formoterol (DULERA) 200-5 MCG/ACT AERO Take 2 puffs first thing in am and then  another 2 puffs about 12 hours later.  3 Inhaler  3  . polyethylene glycol (MIRALAX / GLYCOLAX) packet Take 17 g by mouth See admin instructions. Take 17 grams of miralax every third day.      . rosuvastatin (CRESTOR) 20 MG tablet Take 20 mg by mouth daily.        Marland Kitchen telmisartan (MICARDIS) 40 MG tablet Take 40 mg by mouth daily.        . temazepam (RESTORIL) 15 MG capsule take 1 capsule by mouth at bedtime if needed for sleep  30 capsule  5  . tiotropium (SPIRIVA) 18 MCG inhalation capsule Place 1 capsule (18 mcg total) into inhaler and inhale daily.  90 capsule  3  . bisoprolol (ZEBETA) 10 MG tablet Take 1 tablet (10 mg total) by mouth daily.  30 tablet  11  . nitroGLYCERIN (NITROSTAT) 0.4 MG SL tablet Place 0.4 mg under the tongue every 5 (five) minutes as needed for chest pain.        Inpatient Medications:  . aspirin EC  81 mg Oral Daily  . atorvastatin  40 mg Oral q1800  . clopidogrel  75 mg Oral Daily  . enoxaparin (LOVENOX) injection  40 mg Subcutaneous Q24H  . irbesartan  150 mg Oral Daily  . mometasone-formoterol  2 puff Inhalation BID  . sodium chloride  3 mL Intravenous Q12H  . tiotropium  18 mcg Inhalation Daily    Allergies:  Allergies  Allergen Reactions  . Ace Inhibitors Cough  . Clarithromycin   . Codeine   . Oxycodone-Acetaminophen     History   Social History  . Marital Status: Widowed    Spouse Name: N/A    Number of Children: N/A  . Years of Education: N/A   Occupational History  . retired from [employer]    Social History Main Topics  . Smoking status: Former Smoker -- 1.50 packs/day for 50 years    Types: Cigarettes    Quit date: 05/30/1996  . Smokeless tobacco: Never Used  . Alcohol Use: 1.8 oz/week    3 Cans of beer per week     Comment: 12/18/2012 "I'll have 2-3 beers/wk; sometimes I'll go for awhile and not have any"  . Drug Use: No  . Sexually Active: No   Other Topics Concern  . Not on file   Social History Narrative   Lives  alone.  Widower.     Family History  Problem Relation Age of Onset  . Heart disease Mother   . Colon cancer Neg Hx     BP 122/28  Pulse 32  Temp(Src) 98.6 F (37 C) (Oral)  Resp 18  Ht 5' 10.5" (1.791 m)  Wt 180 lb 6.4 oz (81.829 kg)  BMI 25.51 kg/m2  SpO2 99% Well developed and nourished in no acute distress HENT normal Neck supple with JVP-flat Carotids delayed with transmitted murmur vs bruit Clear Slow Regular rate and rhythm, 3/6 systolic murmur Abd-soft with active BS No Clubbing cyanosis edema Skin-warm and dry A & Oriented  Grossly normal sensory and motor function   Labs:   Lab Results  Component Value Date   WBC 7.8 12/18/2012   HGB 11.7* 12/18/2012   HCT 34.9* 12/18/2012   MCV 93.8 12/18/2012   PLT 126* 12/18/2012    Recent Labs Lab 12/18/12 1709  NA 139  K 5.7*  CL 108  CO2 22  BUN 38*  CREATININE 2.43*  CALCIUM 9.4  PROT 6.7  BILITOT 0.7  ALKPHOS 86  ALT 14  AST 16  GLUCOSE 89    Radiology/Studies: X-ray Chest Pa And Lateral 12/18/2012   *RADIOLOGY REPORT*  Clinical Data: Dyspnea  CHEST - 2 VIEW  Comparison: 03/30/2012  Findings: The patient is status post median sternotomy and CABG. Heart and mediastinal contours are stable with ectasia of the thoracic aorta and some prominence of the central pulmonary arterial segments again identified.  The lung fields demonstrate stable scarring in the lingula.  No new focal infiltrates or signs of congestive failure seen.  No pleural fluid or significant peribronchial cuffing is noted.  Surgical clips are seen in both neck regions and mediastinum.  The patient is status post right shoulder arthroplasty.  IMPRESSION: Stable cardiopulmonary appearance with no new focal or acute abnormality identified   Original Report Authenticated By: Rhodia Albright, M.D.    ZOX:WRUEAV II heart block with LBBB  TELEMETRY: Mobitz II heart block with ventricular rates in the 30's, occasional multiple non conducted P waves  Principal  Problem:   Second degree heart block Active Problems:   CAD (coronary artery disease), native coronary artery   Chronic diastolic heart failure   Chronic kidney disease stage III (GFR 30-59 ml/min)  Pt with symptomatic high grade heart block with underlying LBBB.  Wash out of his drug is necessary, which should be done tomorrow If his LV is significantly depressed and he comes to pacing would suggest CRT Reviewed device implantation with patient Renal function and hyperkalemia an issue, perhaps aggravated by hypoperfusion from bradycardia,.  Because of the possibiliity of CRT and contrast will take liberty of holding ARB today;

## 2012-12-20 ENCOUNTER — Encounter (HOSPITAL_COMMUNITY)
Admission: AD | Disposition: A | Discharge status: Home / Self Care | Payer: Self-pay | Source: Ambulatory Visit | Attending: Cardiology

## 2012-12-20 DIAGNOSIS — I442 Atrioventricular block, complete: Secondary | ICD-10-CM

## 2012-12-20 HISTORY — PX: PACEMAKER INSERTION: SHX728

## 2012-12-20 HISTORY — PX: PERMANENT PACEMAKER INSERTION: SHX5480

## 2012-12-20 LAB — BASIC METABOLIC PANEL
BUN: 34 mg/dL — ABNORMAL HIGH (ref 6–23)
Calcium: 9.8 mg/dL (ref 8.4–10.5)
Chloride: 108 mEq/L (ref 96–112)
Creatinine, Ser: 1.91 mg/dL — ABNORMAL HIGH (ref 0.50–1.35)
GFR calc Af Amer: 36 mL/min — ABNORMAL LOW (ref >90)

## 2012-12-20 SURGERY — PERMANENT PACEMAKER INSERTION
Anesthesia: LOCAL

## 2012-12-20 MED ORDER — BISOPROLOL FUMARATE 5 MG PO TABS
5.0000 mg | ORAL_TABLET | Freq: Every day | ORAL | Status: DC
  Administered 2012-12-20 – 2012-12-21 (×2): 5 mg via ORAL
  Filled 2012-12-20 (×3): qty 1

## 2012-12-20 MED ORDER — SODIUM CHLORIDE 0.9 % IV SOLN: INTRAVENOUS | Status: AC

## 2012-12-20 MED ORDER — CEFAZOLIN SODIUM 1-5 GM-% IV SOLN
1.0000 g | Freq: Four times a day (QID) | INTRAVENOUS | Status: AC
  Administered 2012-12-20 – 2012-12-21 (×3): 1 g via INTRAVENOUS
  Filled 2012-12-20 (×4): qty 50

## 2012-12-20 MED ORDER — LIDOCAINE HCL (PF) 1 % IJ SOLN
INTRAMUSCULAR | Status: AC
  Filled 2012-12-20: qty 60

## 2012-12-20 MED ORDER — MIDAZOLAM HCL 5 MG/5ML IJ SOLN
INTRAMUSCULAR | Status: AC
  Filled 2012-12-20: qty 5

## 2012-12-20 MED ORDER — ONDANSETRON HCL 4 MG/2ML IJ SOLN: 4.0000 mg | Freq: Four times a day (QID) | INTRAMUSCULAR | Status: DC | PRN

## 2012-12-20 MED ORDER — HEPARIN (PORCINE) IN NACL 2-0.9 UNIT/ML-% IJ SOLN
INTRAMUSCULAR | Status: AC
  Filled 2012-12-20: qty 500

## 2012-12-20 NOTE — Progress Notes (Signed)
ELECTROPHYSIOLOGY ROUNDING NOTE    Patient Name: Edward [name] Date of Encounter: 12/20/2012    SUBJECTIVE:Patient feels well.  No chest pain or shortness of breath. Echo yesterday read by Dr Donnie Aho with preliminary report of EF of 55%   TELEMETRY: Reviewed telemetry pt in predominately sinus rhythm with 1:1 conduction since midnight but still times of 2:1 heart block and occasional multiple non conducted P waves  Later he is back with HR of 20  BP 119/44  Pulse 58  Temp(Src) 97.9 F (36.6 C) (Oral)  Resp 18  Ht 5' 10.5" (1.791 m)  Wt 180 lb 6.4 oz (81.829 kg)  BMI 25.51 kg/m2  SpO2 93% Well developed and nourished in no acute distress HENT normal Neck supple with JVP-flat Clear Regular rate and rhythm, no murmurs or gallops Abd-soft with active BS No Clubbing cyanosis edema Skin-warm and dry A & Oriented  Grossly normal sensory and motor function    Intake/Output Summary (Last 24 hours) at 12/20/12 0645 Last data filed at 12/20/12 0528  Gross per 24 hour  Intake   1003 ml  Output      0 ml  Net   1003 ml    CURRENT MEDICATIONS: . aspirin EC  81 mg Oral Daily  . atorvastatin  40 mg Oral q1800  .  ceFAZolin (ANCEF) IV  2 g Intravenous On Call  . clopidogrel  75 mg Oral Daily  . gentamicin irrigation  80 mg Irrigation On Call  . mometasone-formoterol  2 puff Inhalation BID  . sodium chloride  3 mL Intravenous Q12H  . tiotropium  18 mcg Inhalation Daily    LABS: Basic Metabolic Panel:  Recent Labs  19/14/78 0911 12/20/12 0442  NA 137 140  K 4.8 5.1  CL 105 108  CO2 23 24  GLUCOSE 96 85  BUN 39* 34*  CREATININE 2.25* 1.91*  CALCIUM 9.1 9.8   Liver Function Tests:  Recent Labs  12/18/12 1709 12/19/12 0600  AST 16 14  ALT 14 12  ALKPHOS 86 78  BILITOT 0.7 0.5  PROT 6.7 6.2  ALBUMIN 3.5 3.5   CBC:  Recent Labs  12/18/12 1709 12/19/12 0600  WBC 7.8 6.8  NEUTROABS 5.0  --   HGB 11.7* 10.7*  HCT 34.9* 32.6*  MCV 93.8 93.4  PLT  126* 136*   Thyroid Function Tests:  Recent Labs  12/18/12 1709  TSH 1.589   Radiology/Studies:  X-ray Chest Pa And Lateral 12/18/2012   *RADIOLOGY REPORT*  Clinical Data: Dyspnea  CHEST - 2 VIEW  Comparison: 03/30/2012  Findings: The patient is status post median sternotomy and CABG. Heart and mediastinal contours are stable with ectasia of the thoracic aorta and some prominence of the central pulmonary arterial segments again identified.  The lung fields demonstrate stable scarring in the lingula.  No new focal infiltrates or signs of congestive failure seen.  No pleural fluid or significant peribronchial cuffing is noted.  Surgical clips are seen in both neck regions and mediastinum.  The patient is status post right shoulder arthroplasty.  IMPRESSION: Stable cardiopulmonary appearance with no new focal or acute abnormality identified   Original Report Authenticated By: Rhodia Albright, M.D.       Principal Problem:   Second degree heart block Active Problems:   CAD (coronary artery disease), native coronary artery   Chronic diastolic heart failure   Chronic kidney disease stage III (GFR 30-59 ml/min)   Persistent and now demonstably non reversible symptomatic  high grade heart block The benefits and risks were reviewed including but not limited to death,  perforation, infection, lead dislodgement and device malfunction.  The patient understands agrees and is willing to proceed.

## 2012-12-20 NOTE — Progress Notes (Signed)
Subjective:  He may have had some improvement overnight but at the current time his effective heart rate is 26-30. He has significant 2-1 block and his heart rate is actually slower now. Not short of breath.  Objective:  Vital Signs in the last 24 hours: BP 119/44  Pulse 58  Temp(Src) 97.9 F (36.6 C) (Oral)  Resp 18  Ht 5' 10.5" (1.791 m)  Wt 81.829 kg (180 lb 6.4 oz)  BMI 25.51 kg/m2  SpO2 93%  Physical Exam: Pleasant white male in no acute distress Lungs:  Clear  Cardiac:  Slow Regular rhythm, normal S1 and S2, no S3, 1-2/6 systolic murmur at aortic area Extremities:  No edema present  Intake/Output from previous day: 07/23 0701 - 07/24 0700 In: 803 [P.O.:800; I.V.:3] Out: -  Weight Filed Weights   12/18/12 1549  Weight: 81.829 kg (180 lb 6.4 oz)    Lab Results: Basic Metabolic Panel:  Recent Labs  04/54/09 0911 12/20/12 0442  NA 137 140  K 4.8 5.1  CL 105 108  CO2 23 24  GLUCOSE 96 85  BUN 39* 34*  CREATININE 2.25* 1.91*    CBC:  Recent Labs  12/18/12 1709 12/19/12 0600  WBC 7.8 6.8  NEUTROABS 5.0  --   HGB 11.7* 10.7*  HCT 34.9* 32.6*  MCV 93.8 93.4  PLT 126* 136*    BNP    Component Value Date/Time   PROBNP 1395.0* 12/18/2012 1708    PROTIME: Lab Results  Component Value Date   INR 1.10 12/18/2012    Telemetry: Sinus rhythm with 2-1 AV block and left bundle branch block pattern  Assessment/Plan:  1. Persistent second-degree AV block-his beta blocker last dose was Monday and possibly Sunday. I believe that he is to go ahead and have a permanent pacemaker inserted at this time. It has been over 72 hours since his last anticipated blood blocker dose and he may have had an even longer than that. His effective heart rate is slow at this time. I left a message with Dr. Graciela Husbands to go ahead and have the permanent pacemaker placed today. Refer Medtronic  2. Chronic kidney disease now stage IV with improved kidney function today. 3. Chronic  diastolic heart failure 4. Coronary artery disease with redo bypass grafting 5. Left bundle branch block  Recommendations:  Keep n.p.o. for pacemaker insertion today.     [name]  MD [facility]  12/20/2012, 9:03 AM

## 2012-12-20 NOTE — CV Procedure (Addendum)
Preop DX:: Complete heart block Post op DX:: same  Procedure  dual pacemaker implantation  After routine prep and drape, lidocaine was infiltrated in the prepectoral subclavicular region on the left side an incision was made and carried down to later the prepectoral fascia using electrocautery and sharp dissection a pocket was formed similarly. Hemostasis was obtained.  After this, we turned our attention to gaining accessm to the extrathoracic,left subclavian vein. This was accomplished without difficulty and without the aspiration of air or puncture of the artery. 2 separate venipunctures were accomplished; guidewires were placed and retained and sequentially 7 French sheath through which were  passed an Medtronic 5076 ventricular lead serial [id number] and an Medtronic  atrial lead serial number [id number] .  The ventricular lead was manipulated to the right ventricular apex with a bipolar R wave was 12.3, the pacing impedance was  747 the threshold was 0.5 @ 0.5 msec  the current of injury was  brisk.  The right atrial lead was manipulated to the right atrial appendage with a bipolar P-wave  2.2, the pacing impedance was 708, the threshold 0.5@ 0.5 msec    Ma and the current of injury was brisk.   The leads were affixed to the prepectoral fascia and attached to a  Medtronic adaptaL pulse generator serial number [id number].  Hemostasis was obtained. The pocket was copiously irrigated with antibiotic containing saline solution. The leads and the pulse generator were placed in the pocket and affixed to the prepectoral fascia. The wound was then closed in 3 layers in the normal fashion. The wound was washed dried and a benzoin Steri-Strip dressing was applied.  Needle  Count, sponge counts and instrument counts were correct at the end of the procedure .   The patient tolerated the procedure without apparent complication.  Gerlene Burdock.D.

## 2012-12-21 ENCOUNTER — Encounter (HOSPITAL_COMMUNITY): Payer: Self-pay | Admitting: *Deleted

## 2012-12-21 ENCOUNTER — Inpatient Hospital Stay (HOSPITAL_COMMUNITY): Payer: Medicare Other

## 2012-12-21 DIAGNOSIS — Z950 Presence of cardiac pacemaker: Secondary | ICD-10-CM

## 2012-12-21 LAB — BASIC METABOLIC PANEL
BUN: 28 mg/dL — ABNORMAL HIGH (ref 6–23)
Calcium: 9.3 mg/dL (ref 8.4–10.5)
Chloride: 108 mEq/L (ref 96–112)
Creatinine, Ser: 1.65 mg/dL — ABNORMAL HIGH (ref 0.50–1.35)
GFR calc Af Amer: 43 mL/min — ABNORMAL LOW (ref >90)
GFR calc non Af Amer: 37 mL/min — ABNORMAL LOW (ref >90)

## 2012-12-21 NOTE — Progress Notes (Signed)
Subjective:  Tolerated pacer insertion well.  Feels good.  V pacing 100% now. Dr. Graciela Husbands wants to keep 48 hours.  Objective:  Vital Signs in the last 24 hours: BP 125/93  Pulse 60  Temp(Src) 98.7 F (37.1 C) (Oral)  Resp 15  Ht 5\' 11"  (1.803 m)  Wt 83 kg (182 lb 15.7 oz)  BMI 25.53 kg/m2  SpO2 98%  Physical Exam: Pleasant white male in no acute distress Lungs:  Clear  Cardiac:  Regular rhythm, normal S1 and S2, no S3, 1-2/6 systolic murmur at aortic area Extremities:  No edema present  Intake/Output from previous day: 07/24 0701 - 07/25 0700 In: 1055 [P.O.:600; I.V.:305; IV Piggyback:150] Out: -  Weight Filed Weights   12/18/12 1549 12/20/12 1330  Weight: 81.829 kg (180 lb 6.4 oz) 83 kg (182 lb 15.7 oz)    Lab Results: Basic Metabolic Panel:  Recent Labs  16/10/96 0442 12/21/12 0400  NA 140 136  K 5.1 4.8  CL 108 108  CO2 24 21  GLUCOSE 85 87  BUN 34* 28*  CREATININE 1.91* 1.65*    CBC:  Recent Labs  12/18/12 1709 12/19/12 0600  WBC 7.8 6.8  NEUTROABS 5.0  --   HGB 11.7* 10.7*  HCT 34.9* 32.6*  MCV 93.8 93.4  PLT 126* 136*    BNP    Component Value Date/Time   PROBNP 1395.0* 12/18/2012 1708    PROTIME: Lab Results  Component Value Date   INR 1.10 12/18/2012    Telemetry: Sinus rhythm 100% ventricular pacing Assessment/Plan:  1. Functioning pacer for second degree block 2. Chronic kidney disease now stage 3  3. Chronic diastolic heart failure 4. Coronary artery disease with redo bypass grafting 5. Left bundle branch block  Recommendations:  Move to floor, possible discharge in the am on his prior meds.  I need to see in 1 week.  [name]  MD [facility]  12/21/2012, 8:56 AM

## 2012-12-21 NOTE — Progress Notes (Signed)
Transferred to 3w01 by wheelchair , stable, belongings with pt., report given to RN.

## 2012-12-21 NOTE — Progress Notes (Signed)
   ELECTROPHYSIOLOGY ROUNDING NOTE    Patient Name: Edward [name] Date of Encounter: 12/21/2012    SUBJECTIVE:Patient feels well.  No chest pain or shortness of breath.  S/p dual chamber pacemaker implant yesterday.  Pt now pacer dependent.   TELEMETRY: Reviewed telemetry pt P synchronous pacing Filed Vitals:   12/20/12 2000 12/21/12 0009 12/21/12 0508 12/21/12 0750  BP:  151/38 159/92 125/93  Pulse: 59 59 59 60  Temp: 99 F (37.2 C) 99 F (37.2 C)  98.7 F (37.1 C)  TempSrc: Oral Oral  Oral  Resp: 20 22    Height:      Weight:      SpO2: 98% 97% 97% 98%    Intake/Output Summary (Last 24 hours) at 12/21/12 0809 Last data filed at 12/21/12 0700  Gross per 24 hour  Intake   1055 ml  Output      0 ml  Net   1055 ml    CURRENT MEDICATIONS: . aspirin EC  81 mg Oral Daily  . atorvastatin  40 mg Oral q1800  . bisoprolol  5 mg Oral Daily  . clopidogrel  75 mg Oral Daily  . mometasone-formoterol  2 puff Inhalation BID  . sodium chloride  3 mL Intravenous Q12H  . tiotropium  18 mcg Inhalation Daily    LABS: Basic Metabolic Panel:  Recent Labs  16/10/96 0442 12/21/12 0400  NA 140 136  K 5.1 4.8  CL 108 108  CO2 24 21  GLUCOSE 85 87  BUN 34* 28*  CREATININE 1.91* 1.65*  CALCIUM 9.8 9.3   Liver Function Tests:  Recent Labs  12/18/12 1709 12/19/12 0600  AST 16 14  ALT 14 12  ALKPHOS 86 78  BILITOT 0.7 0.5  PROT 6.7 6.2  ALBUMIN 3.5 3.5   CBC:  Recent Labs  12/18/12 1709 12/19/12 0600  WBC 7.8 6.8  NEUTROABS 5.0  --   HGB 11.7* 10.7*  HCT 34.9* 32.6*  MCV 93.8 93.4  PLT 126* 136*   Thyroid Function Tests:  Recent Labs  12/18/12 1709  TSH 1.589     Radiology/Studies:  Dg Chest 2 View 12/21/2012   *RADIOLOGY REPORT*  Clinical Data: Post pacer.  CHEST - 2 VIEW  Comparison: 12/18/2012  Findings: Left pacer is in place with leads in the right atrium and right ventricle.  Prior CABG.  Heart is normal size.  No confluent airspace opacities.   No effusions.  No pneumothorax.  IMPRESSION: Left pacer placement without pneumothorax.   Original Report Authenticated By: Charlett Nose, M.D.   PHYSICAL EXAM  Well developed and nourished in no acute distress HENT normal Neck supple with JVP-flat Clear Regular rate and rhythm, no murmurs or gallops Abd-soft with active BS No Clubbing cyanosis edema Skin-warm and dry A & Oriented  Grossly normal sensory and motor function Mild hematoma   DEVICE INTERROGATION: Device interrogated by industry.  Lead values including impedence, sensing, threshold within normal values.    Principal Problem:   Second degree heart block Active Problems:   CAD (coronary artery disease), native coronary artery   Chronic diastolic heart failure   Chronic kidney disease stage III (GFR 30-59 ml/min)   Renal function improved post pacing  Will arrange followup for wound check, otherwise per dr Billie Lade

## 2012-12-22 LAB — BASIC METABOLIC PANEL
CO2: 24 mEq/L (ref 19–32)
Calcium: 9.5 mg/dL (ref 8.4–10.5)
Creatinine, Ser: 1.54 mg/dL — ABNORMAL HIGH (ref 0.50–1.35)
GFR calc non Af Amer: 41 mL/min — ABNORMAL LOW (ref >90)

## 2012-12-22 MED ORDER — BISOPROLOL FUMARATE 5 MG PO TABS: 5.0000 mg | ORAL_TABLET | Freq: Every day | ORAL | Status: DC

## 2012-12-22 NOTE — Discharge Summary (Addendum)
Physician Discharge Summary  Patient ID: Edward [name] MRN: [id number] DOB/AGE: 1930-03-03 79 y.o.  Admit date: 12/18/2012 Discharge date: 12/22/2012  Admission Diagnoses: Non reversible symptomatic high grade heart block   Discharge Diagnoses:  Principal Problem:   Second degree heart block Active Problems:   CAD (coronary artery disease), native coronary artery   LBBB   Chronic diastolic heart failure   Chronic kidney disease stage III (GFR 30-59 ml/min)   Discharged Condition: good  Hospital Course: Patient admitted to the hospital with second-degree heart block. He has an extensive vascular history with previous redo bypass grafting and also has hypertensive heart disease, severe peripheral vascular disease with claudication as well as previous bilateral carotid artery disease. He has mild aortic stenosis. He has a chronic left bundle branch block.   He was recently changed from metoprolol to bisoprolol. He has had significant weakness and difficulty with severe dyspnea on exertion. He felt as if he could barely go. While sitting in a chair yesterday lost his vision and had difficulty seeing and has had some episodic dizziness. He has not had frank syncope. He was seen in the office today and was found to be in 2:1 AV block with an effective heart rate of around 35 and somewhat low blood pressure. He is admitted to the hospital this time because the visual loss and the second-degree heart block. He has not had recent angina. He does have dyspnea as noted above and is limited with claudication.    Procedure dual pacemaker implantation Medtronic - Dr. Graciela Husbands. 12/20/12   Device interrogated by industry. Lead values including impedence, sensing, threshold within normal values.   12/18/12: ECHO - Left ventricle: The cavity size was normal. Wall thickness was increased increased in a pattern of mild to moderate LVH. Systolic function was normal. Wall motion was normal; there were no  regional wall motion abnormalities. - Aortic valve: Trileaflet; normal thickness, mildly calcified leaflets. Valve mobility was trivially restricted. Transvalvular velocity was minimally increased. There was mild stenosis. Trivial regurgitation. Valve area: 1.28cm^2(VTI). Valve area: 1.51cm^2 (Vmax). Valve area: 1.32cm^2 (Vmean). - Mitral valve: Moderately calcified annulus. Mild regurgitation. - Left atrium: The atrium was moderately dilated.  12/21/12: CXR Left pacer placement without pneumothorax  He is feeling well. He was taking Bisoprolol post pacer placement. Will continue. No evidence of bleeding. Doing well. Ready to go home.   BMET    Component Value Date/Time   NA 136 12/22/2012 0550   K 4.8 12/22/2012 0550   CL 104 12/22/2012 0550   CO2 24 12/22/2012 0550   GLUCOSE 92 12/22/2012 0550   BUN 26* 12/22/2012 0550   CREATININE 1.54* 12/22/2012 0550   CALCIUM 9.5 12/22/2012 0550   GFRNONAA 41* 12/22/2012 0550   GFRAA 47* 12/22/2012 0550   CBC    Component Value Date/Time   WBC 6.8 12/19/2012 0600   RBC 3.49* 12/19/2012 0600   HGB 10.7* 12/19/2012 0600   HCT 32.6* 12/19/2012 0600   PLT 136* 12/19/2012 0600   MCV 93.4 12/19/2012 0600   MCH 30.7 12/19/2012 0600   MCHC 32.8 12/19/2012 0600   RDW 13.9 12/19/2012 0600   LYMPHSABS 1.6 12/18/2012 1709   MONOABS 0.9 12/18/2012 1709   EOSABS 0.2 12/18/2012 1709   BASOSABS 0.1 12/18/2012 1709    TSH - 1.58  BNP (last 3 results)  Recent Labs  12/18/12 1708  PROBNP 1395.0*    Consults: EP - Dr, Graciela Husbands  Significant Diagnostic Studies: as above  Treatments: Dual  chamber pacer  Discharge Exam: Blood pressure 111/58, pulse 65, temperature 98.6 F (37 C), temperature source Oral, resp. rate 18, height 5\' 11"  (1.803 m), weight 83 kg (182 lb 15.7 oz), SpO2 98.00%. General appearance: alert, cooperative and appears stated age Cardio: regular rate and rhythm, S1, S2 normal, no murmur, click, rub or gallop GI: soft, non-tender; bowel  sounds normal; no masses,  no organomegaly Extremities: extremities normal, atraumatic, no cyanosis or edema Pulses: 2+ and symmetric Skin: Skin color, texture, turgor normal. No rashes or lesions Pacer site c/d/i  Disposition:   Discharge Orders   Future Appointments Provider Department Dept Phone   01/02/2013 12:00 PM [facility] [employer] Main [facility] [place]) [phone]   04/30/2013 10:30 AM Newt Lukes, MD [facility] [phone]   Future Orders Complete By Expires     Diet - low sodium heart healthy  As directed     Increase activity slowly  As directed         Medication List         aspirin 81 MG tablet  Take 81 mg by mouth daily.     bisoprolol 5 MG tablet  Commonly known as:  ZEBETA  Take 1 tablet (5 mg total) by mouth daily.     clopidogrel 75 MG tablet  Commonly known as:  PLAVIX  Take 75 mg by mouth daily.     colchicine 0.6 MG tablet  Take 0.6 mg by mouth daily as needed (gout).     mometasone-formoterol 200-5 MCG/ACT Aero  Commonly known as:  DULERA  Take 2 puffs first thing in am and then another 2 puffs about 12 hours later.     nitroGLYCERIN 0.4 MG SL tablet  Commonly known as:  NITROSTAT  Place 0.4 mg under the tongue every 5 (five) minutes as needed for chest pain.     polyethylene glycol packet  Commonly known as:  MIRALAX / GLYCOLAX  Take 17 g by mouth See admin instructions. Take 17 grams of miralax every third day.     rosuvastatin 20 MG tablet  Commonly known as:  CRESTOR  Take 20 mg by mouth daily.     telmisartan 40 MG tablet  Commonly known as:  MICARDIS  Take 40 mg by mouth daily.     temazepam 15 MG capsule  Commonly known as:  RESTORIL  take 1 capsule by mouth at bedtime if needed for sleep     tiotropium 18 MCG inhalation capsule  Commonly known as:  SPIRIVA  Place 1 capsule (18 mcg total) into inhaler and inhale daily.           Follow-up Information   Follow up with  [name], MD. (Next week)    Contact information:   [address] [place] [address] [phone]       Signed: Donato Schultz 12/22/2012, 9:30 AM

## 2012-12-28 ENCOUNTER — Encounter: Payer: Self-pay | Admitting: Cardiology

## 2012-12-28 NOTE — Progress Notes (Signed)
Patient ID: Edward [name], male   DOB: [birth year], 79 y.o.   MRN: [id number]   Edward [name], Edward [name]  Date of visit:  12/28/2012 DOB:  December 19, 1929    Age:  79 yrs. Medical record number:  [id number]     Account number:  [id number] Primary Care Provider: LESCHBER,VALERIE ANN ____________________________ CURRENT DIAGNOSES  1. CAD,Native  2. Left bundle branch hemiblock  3. Hypertension,Essential (Benign)  4. Hyperlipidemia  5. Pacemaker/cardiac insitu  6. Peripheral Vascular Disease  7. Aortic Valve Disorder  8. MI-S/P Inferior  9. Chronic Kidney Disease (Stage 3)  10. COPD  11. Surgery-Aortocoronary Bypass Grafting  12. Gout  13. Carotid artery stenosis ____________________________ ALLERGIES  Ace Inhibitors, Dry cough  Clarithromycin, Intolerance-unknown  Codeine, Intolerance-unknown ____________________________ MEDICATIONS  1. multivitamin tablet, 3x week  2. Spiriva with HandiHaler 18 mcg capsule, w/inhalation device, 1 p.o. daily  3. temazepam 15 mg Capsule, QHS  4. Colcrys 0.6 mg tablet, PRN  5. furosemide 20 mg tablet, PRN swelling  6. Micardis 40 mg tablet, 1 p.o. daily  7. nitroglycerin 0.4 mg tablet, sublingual, PRN  8. Dulera 200-5 mcg/actuation HFA aerosol inhaler, 2 puff bid  9. Crestor 20 mg tablet, 1 p.o. daily  10. Plavix 75 mg tablet, 1 p.o. daily  11. aspirin 81 mg tablet,chewable, 1 p.o. daily ____________________________ HISTORY OF PRESENT ILLNESS  Patient seen for cardiac followup. He was admitted to the hospital with second degree heart block and had a very slow heart rate. He had a permanent pacemaker implanted last week and has significantly improved in terms of his dyspnea as well as his exercise tolerance. He is no longer having shortness of breath or dizziness. He denies angina and has no PND or orthopnea. He wants to stop his beta blocker and see how he feels at this time. He does have some claudication but still. ____________________________ PAST HISTORY   Past Medical Illnesses:  hypertension, hyperlipidemia, peripheral vascular disease, COPD, history of shingles, gout, GERD;  Cardiovascular Illnesses:  diastolic CHF, CAD, S/P MI-inferior, conduction disorder-LBBB, second degree heart block;  Surgical Procedures:  redo CABG w SVG to OM, SVG to RCA8/10/00 Dr. Laneta Simmers, CABG w LIMA to LAD, SVG to dx, OM, RCA 1989 Dr. Edwyna Shell, carotid endarterectomy-bil, cholecystectomy, hemorrhoidectomy, L carotid subclavian bypass, repair of anal fissure, AAA repair, R Shoulder surgery x2;  NYHA Classification:  II;  Canadian Angina Classification:  Class 0: Asymptomatic;  Cardiology Procedures-Invasive:  cardiac cath (left) 2000;  Medtronic pacer insertion July 2014 Cardiology Procedures-Noninvasive:  echocardiogram January 2008, adenosine cardiolte March 2008, echocardiogram June 2011, treadmill cardiolite June 2011, echocardiogram November 2013;  Cardiac Cath Results:  normal Left main, occluded LAD, occluded CFX, occluded RCA, occluded Diag 1 SVG, occluded RCA SVG, 99% stenosis CFX SVG, widely patent LAD LIMA graft;  LVEF of 48% documented via nuclear study on 11/26/2009,   ____________________________ CARDIO-PULMONARY TEST DATES EKG Date:  12/18/2012;   Cardiac Cath Date:  05/10/2007;  CABG: 01/07/1999;  Nuclear Study Date:  11/26/2009;  Echocardiography Date: 04/04/2012;  Chest Xray Date: 03/30/2012;   ____________________________ FAMILY HISTORY Brother -- Diabetes mellitus, Deceased Father -- Leukemia, Deceased Mother -- Coronary Artery Disease, Deceased Sister -- Sister alive and well Sister -- Cancer Sister -- Sister alive and well Sister -- Cancer, Deceased Sister -- Coronary Artery Disease ____________________________ SOCIAL HISTORY Alcohol Use:  beer;  Smoking:  used to smoke but quit 1998, greater than 50 pack year history;  Diet:  regular diet without modifications;  Lifestyle:  widower;  Exercise:  some exercise;  Occupation:  retired Therapist, music;   Residence:  lives with son;   ____________________________ REVIEW OF SYSTEMS General:  malaise and fatigue Eyes: wears eye glasses/contact lenses, cataracts Respiratory: mild dyspnea with exertion Cardiovascular:  please review HPI Abdominal: denies dyspepsia, GI bleeding, constipation, or diarrhea Genitourinary-Male: frequency, hesitancy, erectile dysfunction  Musculoskeletal:  arthritis of the right shoulder  ____________________________ PHYSICAL EXAMINATION VITAL SIGNS  Blood Pressure:  98/44 Sitting, Right arm, large cuff   Pulse:  60/min. Weight:  182.00 lbs. Height:  70"BMI: 26  Constitutional:  pleasant white male in no acute distress Skin:  warm and dry to touch, no apparent skin lesions, or masses noted. Head:  normocephalic, normal hair pattern, no masses or tenderness ENT:  ears, nose and throat reveal no gross abnormalities.  Dentition good. Neck:  bilateral carotid endarterectomy scars, no JVD, no bruits, no masses, non-tender Chest:  clear to auscultation and percussion, healed median sternotomy scar, healed pacemaker incision in the left pectoral area Cardiac:  regular rhythm, normal S1 and S2, no S3 or S4, grade 1/6 systolic murmur at aortic area radiating to neck Peripheral Pulses:  bilateral femoral bruits present, femoral pulses 2+, dorsalis pedis pulses diminished, posterior tibial pulses diminished Extremities & Back:  well healed saphenous vein donor site RLE, well healed saphenous vein donor site LLE, no edema present Neurological:  no gross motor or sensory deficits noted, affect appropriate, oriented x3. ____________________________ MOST RECENT LIPID PANEL 10/01/12  CHOL TOTL 125 mg/dl, LDL 69 calc, HDL 40 mg/dl, TRIGLYCER 84 mg/dl and CHOL/HDL 3.2 (Calc) ____________________________ IMPRESSIONS/PLAN  1. Recent pacemaker implantation for second degree heart block and severe bradycardia 2. Coronary artery disease with previous bypass grafting 3. Aortic valve  disease 4. Hypertension controlled 5. Hyperlipidemia under treatment  Recommendations:  Clinically has improved significantly and is no longer dyspneic and having no edema or weakness. His exercise capacity has improved significantly. Recommended followup in 3 months. Hospital records reviewed. ____________________________ TODAYS ORDERS  1. Return Visit: 3 months                       ____________________________ Cardiology Physician:  [name] MD [facility]

## 2013-01-02 ENCOUNTER — Encounter: Payer: Self-pay | Admitting: Internal Medicine

## 2013-01-02 ENCOUNTER — Ambulatory Visit (INDEPENDENT_AMBULATORY_CARE_PROVIDER_SITE_OTHER): Payer: Medicare Other | Admitting: *Deleted

## 2013-01-02 DIAGNOSIS — I441 Atrioventricular block, second degree: Secondary | ICD-10-CM

## 2013-01-02 LAB — PACEMAKER DEVICE OBSERVATION
AL IMPEDENCE PM: 580 Ohm
AL THRESHOLD: 0.5 V
ATRIAL PACING PM: 10
RV LEAD IMPEDENCE PM: 639 Ohm

## 2013-01-02 NOTE — Progress Notes (Signed)
Wound check-PPM in office. 

## 2013-01-09 ENCOUNTER — Telehealth: Payer: Self-pay | Admitting: Internal Medicine

## 2013-01-09 MED ORDER — MOMETASONE FURO-FORMOTEROL FUM 200-5 MCG/ACT IN AERO: INHALATION_SPRAY | RESPIRATORY_TRACT | Status: DC

## 2013-01-09 MED ORDER — TIOTROPIUM BROMIDE MONOHYDRATE 18 MCG IN CAPS: 18.0000 ug | ORAL_CAPSULE | Freq: Every day | RESPIRATORY_TRACT | Status: DC

## 2013-01-09 NOTE — Telephone Encounter (Signed)
Rx's have been sent in. Pt is aware. 

## 2013-03-28 ENCOUNTER — Ambulatory Visit (INDEPENDENT_AMBULATORY_CARE_PROVIDER_SITE_OTHER): Payer: Medicare Other | Admitting: Internal Medicine

## 2013-03-28 ENCOUNTER — Encounter: Payer: Self-pay | Admitting: Internal Medicine

## 2013-03-28 ENCOUNTER — Encounter: Payer: Self-pay | Admitting: Cardiology

## 2013-03-28 VITALS — BP 110/60 | HR 69 | Ht 71.0 in | Wt 192.0 lb

## 2013-03-28 DIAGNOSIS — Z7901 Long term (current) use of anticoagulants: Secondary | ICD-10-CM

## 2013-03-28 DIAGNOSIS — I4891 Unspecified atrial fibrillation: Secondary | ICD-10-CM

## 2013-03-28 DIAGNOSIS — Z950 Presence of cardiac pacemaker: Secondary | ICD-10-CM

## 2013-03-28 DIAGNOSIS — I441 Atrioventricular block, second degree: Secondary | ICD-10-CM

## 2013-03-28 DIAGNOSIS — I2510 Atherosclerotic heart disease of native coronary artery without angina pectoris: Secondary | ICD-10-CM

## 2013-03-28 DIAGNOSIS — I4892 Unspecified atrial flutter: Secondary | ICD-10-CM

## 2013-03-28 DIAGNOSIS — I5032 Chronic diastolic (congestive) heart failure: Secondary | ICD-10-CM

## 2013-03-28 LAB — PACEMAKER DEVICE OBSERVATION
AL THRESHOLD: 0.5 V
ATRIAL PACING PM: 10
RV LEAD THRESHOLD: 0.5 V

## 2013-03-28 NOTE — Assessment & Plan Note (Signed)
Stable post pacing he is paced   approximately 90% of the time

## 2013-03-28 NOTE — Assessment & Plan Note (Signed)
Euvolemic. 

## 2013-03-28 NOTE — Patient Instructions (Signed)
No follow up per Dr. Graciela Husbands.

## 2013-03-28 NOTE — Assessment & Plan Note (Signed)
Stable

## 2013-03-28 NOTE — Progress Notes (Signed)
Patient Care Team: Newt Lukes, MD as PCP - General Nyoka Cowden, MD as Consulting Physician (Pulmonary Disease) Othella Boyer, MD as Consulting Physician (Cardiology) Meryl Dare, MD as Consulting Physician (Gastroenterology) Marcine Matar, MD (Urology)   HPI  Edward [name] is a 79 y.o. male Seen in followup for pacemaker implanted 7/14 for intermittent high-grade heart block without a reversible cause.  He has a history of ischemic heart disease with prior bypass surgery  He is much better following pacemaker. He is still some dyspnea on exertion.  Past Medical History  Diagnosis Date  . Hypertension   . Hyperlipidemia   . Gout     "only once in my lifetime" (12/18/2012)  . Depression   . CAD (coronary artery disease)     s/p CABG '89, redo '00  . Dyslipidemia   . PVD (peripheral vascular disease)     s/p B CEA  . COPD (chronic obstructive pulmonary disease)      PFT 6.19.07: FEV1 465  ratio 60 with 25% response to B2.  > PFTs 8.29.07: FEV1 61%  ratio 46% no better after B2.  > add on advair 12.20.11-improved 1.31.12  . AAA (abdominal aortic aneurysm)   . Melanosis coli   . Internal hemorrhoids   . Carotid artery occlusion   . Aortic valve disorder   . Chronic diastolic heart failure 03/30/2009  . CAD (coronary artery disease), native coronary artery     CABG w LIMA to LAD, SVG to dx, OM, RCA 1989 Dr. Edwyna Shell for 3VD PTCA of OM, 1999 and 2000 Cath showed occlusion of left main and RCA with stenosis in OM Redo redo CABG w SVG to OM, SVG to RCA8/10/00 Dr. Laneta Simmers   . Hyperlipidemia   . BPH (benign prostatic hypertrophy)   . Hypertensive heart disease     Change toprol to bisoprolol 10 mg daily on trial basis  05/21/2012  - notified by [facility] non adherent 07/17/2012    . Myocardial infarction 1979  . Chronic kidney disease stage III (GFR 30-59 ml/min)   . Congenital absence of kidney     "noted during AAA repair; never knew it before" (12/18/2012)    . LBBB (left bundle branch block)   . Second degree heart block 12/18/2012    s/p MDT Adapta L pacemaker 12-20-2012 by Dr Graciela Husbands    Past Surgical History  Procedure Laterality Date  . Coronary artery bypass graft  1989, 2000     "CABG X ?3; CABG X 5 w//Bartle" (12/18/2012)  . Cholecystectomy  1998  . Hemiarthroplasty shoulder fracture Right ~ 2008     x 2 - [facility]  . Carotid endarterectomy Bilateral 1990's  . Abdominal aortic aneurysm repair  1998  . Carotid-subclavian bypass graft Left   . Anal fissure repair      12/18/2012 "I don't remember this"  . Hemorroidectomy      12/18/2012 "I don't remember this"  . Carpal tunnel release Right 1980's    "Dr. Devota Pace" (12/18/2012)  . Cardiac catheterization      "2 or 3" (12/18/2012)  . Coronary angioplasty      "a few" (12/18/2012)  . Coronary angioplasty with stent placement      "I've got 1 or 2" (12/18/2012)  . Joint replacement    . Hip fracture surgery  1944    "fell out of a tree; had it operated on 3 times" (12/18/2012)  . Refractive surgery Bilateral 1990's?  Marland Kitchen  Pacemaker insertion  12-20-2012    dual chamber Medtronic Adapta L pacemaker implanted by Dr Graciela Husbands    Current Outpatient Prescriptions  Medication Sig Dispense Refill  . aspirin 81 MG tablet Take 81 mg by mouth daily.        . bisoprolol (ZEBETA) 5 MG tablet Take 1 tablet (5 mg total) by mouth daily.  30 tablet  12  . clopidogrel (PLAVIX) 75 MG tablet Take 75 mg by mouth daily.        . colchicine 0.6 MG tablet Take 0.6 mg by mouth daily as needed (gout).      . mometasone-formoterol (DULERA) 200-5 MCG/ACT AERO Take 2 puffs first thing in am and then another 2 puffs about 12 hours later.  3 Inhaler  1  . nitroGLYCERIN (NITROSTAT) 0.4 MG SL tablet Place 0.4 mg under the tongue every 5 (five) minutes as needed for chest pain.      . polyethylene glycol (MIRALAX / GLYCOLAX) packet Take 17 g by mouth See admin instructions. Take 17 grams of miralax every third day.       . rosuvastatin (CRESTOR) 20 MG tablet Take 20 mg by mouth daily.        Marland Kitchen telmisartan (MICARDIS) 40 MG tablet Take 40 mg by mouth daily.        . temazepam (RESTORIL) 15 MG capsule take 1 capsule by mouth at bedtime if needed for sleep  30 capsule  5  . tiotropium (SPIRIVA) 18 MCG inhalation capsule Place 1 capsule (18 mcg total) into inhaler and inhale daily.  90 capsule  1   No current facility-administered medications for this visit.    Allergies  Allergen Reactions  . Ace Inhibitors Cough  . Clarithromycin   . Codeine   . Oxycodone-Acetaminophen     Review of Systems negative except from HPI and PMH  Physical Exam BP 110/60  Pulse 69  Ht 5\' 11"  (1.803 m)  Wt 192 lb (87.091 kg)  BMI 26.79 kg/m2  SpO2 96% Well developed and well nourished in no acute distress HENT normal E scleral and icterus clear Neck Supple Clear to ausculation Well developed and nourished in no acute distress HENT normal Neck supple with JVP-flat Clear Regular rate and rhythm 2/6 systolic murmur  Device pocket well healed; without hematoma or erythema.  There is no tethering  Soft  No Clubbing cyanosis edema Skin-warm and dry A & Oriented  Grossly normal sensory and motor function Regular rate and rhythm, no murmurs gallops or rub Soft with active bowel sounds No clubbing cyanosis none Edema Alert and oriented, grossly normal motor and sensory function Skin Warm and Dry    Assessment and  Plan

## 2013-03-28 NOTE — Progress Notes (Unsigned)
Patient ID: Edward [name], male   DOB: August 27, 1929, 79 y.o.   MRN: [id number]  Edward, [name]  Date of visit:  03/28/2013 DOB:  25-Feb-1930    Age:  79 yrs. Medical record number:  [id number]     Account number:  [id number] Primary Care Provider: [facility] ____________________________ CURRENT DIAGNOSES  1. Arrhythmia-Atrial Fibrillation  2. CAD,Native  3. Peripheral Vascular Disease  4. Chronic Kidney Disease (Stage 3)  5. Pacemaker/cardiac insitu  6. Left bundle branch hemiblock  7. COPD  8. Aortic Valve Disorder  9. Hyperlipidemia  10. Hypertension,Essential (Benign)  11. MI-S/P Inferior  12. Gout  13. Carotid artery stenosis  14. Dyspnea  15. Surgery-Aortocoronary Bypass Grafting ____________________________ ALLERGIES  Ace Inhibitors, Dry cough  Clarithromycin, Intolerance-unknown  Codeine, Intolerance-unknown ____________________________ MEDICATIONS  1. multivitamin tablet, 3x week  2. Spiriva with HandiHaler 18 mcg capsule, w/inhalation device, 1 p.o. daily  3. temazepam 15 mg Capsule, QHS  4. Colcrys 0.6 mg tablet, PRN  5. furosemide 20 mg tablet, PRN swelling  6. Micardis 40 mg tablet, 1 p.o. daily  7. nitroglycerin 0.4 mg tablet, sublingual, PRN  8. Dulera 200-5 mcg/actuation HFA aerosol inhaler, 2 puff bid  9. aspirin 81 mg tablet,chewable, 1 p.o. daily  10. Crestor 20 mg tablet, 1 p.o. daily  11. clopidogrel 75 mg tablet, 1 p.o. daily  12. Eliquis 2.5 mg tablet, BID ____________________________ CHIEF COMPLAINTS  Atrial fibrillation  Followup of CAD,Native  Followup of Pacemaker/cardiac insitu ____________________________ HISTORY OF PRESENT ILLNESS  Patient seen for cardiac evaluation. He continues to have some mild dyspnea but feels significantly improved since he had his pacemaker implanted. He denies angina and has no PND, orthopnea, syncope, palpitations, or claudication. He was seen by Dr. Graciela Husbands earlier today and was noted to have a several day  period of atrial fibrillation from August 19 for August 23. Apart from that he does not really have a lot of other atrial fibrillation noted on his monitor. He really doesn't have a history of GI bleeding that we can tell. He has not had any recent congestive heart failure. ____________________________ PAST HISTORY  Past Medical Illnesses:  hypertension, hyperlipidemia, peripheral vascular disease, COPD, history of shingles, gout, GERD;  Cardiovascular Illnesses:  diastolic CHF, CAD, S/P MI-inferior, conduction disorder-LBBB, second degree heart block;  Surgical Procedures:  redo CABG w SVG to OM, SVG to RCA8/10/00 Dr. Laneta Simmers, CABG w LIMA to LAD, SVG to dx, OM, RCA 1989 Dr. Edwyna Shell, carotid endarterectomy-bil, cholecystectomy, hemorrhoidectomy, L carotid subclavian bypass, repair of anal fissure, AAA repair, R Shoulder surgery x2;  NYHA Classification:  II;  Canadian Angina Classification:  Class 0: Asymptomatic;  Cardiology Procedures-Invasive:  cardiac cath (left) 2000;  Cardiology Procedures-Noninvasive:  echocardiogram January 2008, adenosine cardiolte March 2008, echocardiogram June 2011, treadmill cardiolite June 2011, echocardiogram November 2013;  Cardiac Cath Results:  normal Left main, occluded LAD, occluded CFX, occluded RCA, occluded Diag 1 SVG, occluded RCA SVG, 99% stenosis CFX SVG, widely patent LAD LIMA graft;  LVEF of 48% documented via nuclear study on 11/26/2009,   ____________________________ CARDIO-PULMONARY TEST DATES EKG Date:  12/18/2012;   Cardiac Cath Date:  05/10/2007;  CABG: 01/07/1999;  Nuclear Study Date:  11/26/2009;  Echocardiography Date: 04/04/2012;  Chest Xray Date: 03/30/2012;   ____________________________ FAMILY HISTORY Brother -- Brother dead, Diabetes mellitus Brother -- Brother alive and well Father -- Father dead, Leukemia, Deceased Mother -- Mother dead, Coronary Artery Disease, Deceased Sister -- Sister alive and well Sister -- Sister dead Sister --  Sister  alive with problem, Cancer Sister -- Sister alive and well Sister -- Sister dead, Cancer Sister -- Coronary Artery Disease ____________________________ SOCIAL HISTORY Alcohol Use:  beer;  Smoking:  used to smoke but quit 1998, greater than 50 pack year history;  Diet:  regular diet without modifications;  Lifestyle:  widower;  Exercise:  some exercise;  Occupation:  retired Therapist, music;  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 Eyes: wears eye glasses/contact lenses, cataracts Ears, Nose, Throat, Mouth:  denies any hearing loss, epistaxis, hoarseness or difficulty speaking. Respiratory: mild dyspnea with exertion Cardiovascular:  please review HPI Abdominal: denies dyspepsia, GI bleeding, constipation, or diarrhea Genitourinary-Male: frequency, hesitancy, erectile dysfunction  Musculoskeletal:  arthritis of the right shoulder Neurological:  denies headaches, stroke, or TIA,.  ____________________________ PHYSICAL EXAMINATION VITAL SIGNS  Blood Pressure:  110/60 Sitting, Left arm, regular cuff  , 104/58 Standing, Left arm and regular cuff   Pulse:  76/min. Weight:  192.00 lbs. Height:  70"BMI: 27  Constitutional:  pleasant white male in no acute distress Skin:  warm and dry to touch, no apparent skin lesions, or masses noted. Head:  normocephalic, normal hair pattern, no masses or tenderness ENT:  ears, nose and throat reveal no gross abnormalities.  Dentition good. Neck:  bilateral carotid endarterectomy scars, no JVD, no bruits, no masses, non-tender Chest:  clear to auscultation and percussion, healed median sternotomy scar, healed pacemaker incision in the left pectoral area Cardiac:  regular rhythm, normal S1 and S2, no S3 or S4, grade 1/6 systolic murmur at aortic area radiating to neck Peripheral Pulses:  bilateral femoral bruits present, femoral pulses 2+, dorsalis pedis pulses  diminished, posterior tibial pulses diminished Extremities & Back:  well healed saphenous vein donor site RLE, well healed saphenous vein donor site LLE, no edema present Neurological:  no gross motor or sensory deficits noted, affect appropriate, oriented x3. ____________________________ MOST RECENT LIPID PANEL 10/01/12  CHOL TOTL 125 mg/dl, LDL 69 calc, HDL 40 mg/dl, TRIGLYCER 84 mg/dl and CHOL/HDL 3.2 (Calc) ____________________________ IMPRESSIONS/PLAN 1. One episode of atrial fibrillation noted on pacemaker monitoring 2. Functioning permanent pacemaker 3. Coronary artery disease with previous bypass grafting 4. History of chronic diastolic heart failure  Recommendations:  Discussion about atrial fibrillation with patient. His CHADS2VASC score is 4. We discussed anticoagulation and I have recommended that he go ahead and along this. We discussed the different agents and I would like him to take Eliquus 2.5 mg twice daily. He should discontinue his clopidigel at this time. I will plan to see him in followup in 3 months. Call if problems. ____________________________ TODAYS ORDERS  1. Return Visit: 3 months                       ____________________________ Cardiology Physician:  [name] MD [facility]

## 2013-03-28 NOTE — Assessment & Plan Note (Signed)
He is newly identified atrial fibrillation/flutter. This persisted for some days. He is to see Dr. Donnie Aho this afternoon and I will defer to them the discussion regarding anticoagulation

## 2013-04-08 ENCOUNTER — Encounter: Payer: Self-pay | Admitting: Internal Medicine

## 2013-04-09 ENCOUNTER — Ambulatory Visit (INDEPENDENT_AMBULATORY_CARE_PROVIDER_SITE_OTHER): Payer: Medicare Other

## 2013-04-09 DIAGNOSIS — Z23 Encounter for immunization: Secondary | ICD-10-CM

## 2013-04-16 ENCOUNTER — Telehealth: Payer: Self-pay | Admitting: Internal Medicine

## 2013-04-16 NOTE — Telephone Encounter (Signed)
lmtcb x1 

## 2013-04-17 MED ORDER — TIOTROPIUM BROMIDE MONOHYDRATE 18 MCG IN CAPS: 18.0000 ug | ORAL_CAPSULE | Freq: Every day | RESPIRATORY_TRACT | Status: DC

## 2013-04-17 MED ORDER — MOMETASONE FURO-FORMOTEROL FUM 200-5 MCG/ACT IN AERO: INHALATION_SPRAY | RESPIRATORY_TRACT | Status: DC

## 2013-04-17 NOTE — Telephone Encounter (Signed)
i spoke with pt and conformed RX's needed to be sent. Nothing further needed

## 2013-04-30 ENCOUNTER — Ambulatory Visit (INDEPENDENT_AMBULATORY_CARE_PROVIDER_SITE_OTHER): Payer: Medicare Other | Admitting: Internal Medicine

## 2013-04-30 ENCOUNTER — Encounter: Payer: Self-pay | Admitting: Internal Medicine

## 2013-04-30 ENCOUNTER — Other Ambulatory Visit (INDEPENDENT_AMBULATORY_CARE_PROVIDER_SITE_OTHER): Payer: Medicare Other

## 2013-04-30 VITALS — BP 102/54 | HR 83 | Temp 98.4°F | Wt 190.8 lb

## 2013-04-30 DIAGNOSIS — N400 Enlarged prostate without lower urinary tract symptoms: Secondary | ICD-10-CM

## 2013-04-30 DIAGNOSIS — J449 Chronic obstructive pulmonary disease, unspecified: Secondary | ICD-10-CM

## 2013-04-30 DIAGNOSIS — D649 Anemia, unspecified: Secondary | ICD-10-CM

## 2013-04-30 DIAGNOSIS — I119 Hypertensive heart disease without heart failure: Secondary | ICD-10-CM

## 2013-04-30 DIAGNOSIS — I4891 Unspecified atrial fibrillation: Secondary | ICD-10-CM

## 2013-04-30 LAB — CBC WITH DIFFERENTIAL/PLATELET
Eosinophils Relative: 5 % (ref 0.0–5.0)
Monocytes Relative: 15.8 % — ABNORMAL HIGH (ref 3.0–12.0)
Neutrophils Relative %: 61.9 % (ref 43.0–77.0)
Platelets: 187 10*3/uL (ref 150.0–400.0)
WBC: 7.1 10*3/uL (ref 4.5–10.5)

## 2013-04-30 LAB — BASIC METABOLIC PANEL
CO2: 26 mEq/L (ref 19–32)
GFR: 31.79 mL/min — ABNORMAL LOW (ref >60.00)
Glucose, Bld: 95 mg/dL (ref 70–99)
Potassium: 5.6 mEq/L — ABNORMAL HIGH (ref 3.5–5.1)
Sodium: 138 mEq/L (ref 135–145)

## 2013-04-30 MED ORDER — TEMAZEPAM 15 MG PO CAPS: 15.0000 mg | ORAL_CAPSULE | Freq: Every day | ORAL | Status: DC

## 2013-04-30 NOTE — Progress Notes (Signed)
Subjective:    Patient ID: Edward [name], male    DOB: February 08, 1930, 79 y.o.   MRN: [id number]  HPI  Here for follow up - reviewed chronic medical issues: CAD (CABG), PVD (CEA), COPD, insomnia, PPM placed 11/2012 for second degree HB  Past Medical History  Diagnosis Date  . Hypertension   . Hyperlipidemia   . Gout     "only once in my lifetime" (12/18/2012)  . Depression   . CAD (coronary artery disease)     s/p CABG '89, redo '00  . Dyslipidemia   . PVD (peripheral vascular disease)     s/p B CEA  . COPD (chronic obstructive pulmonary disease)      PFT 6.19.07: FEV1 465  ratio 60 with 25% response to B2.  > PFTs 8.29.07: FEV1 61%  ratio 46% no better after B2.  > add on advair 12.20.11-improved 1.31.12  . AAA (abdominal aortic aneurysm)   . Melanosis coli   . Internal hemorrhoids   . Carotid artery occlusion   . Aortic valve disorder   . Chronic diastolic heart failure 03/30/2009  . CAD (coronary artery disease), native coronary artery     CABG w LIMA to LAD, SVG to dx, OM, RCA 1989 Dr. Edwyna Shell for 3VD PTCA of OM, 1999 and 2000 Cath showed occlusion of left main and RCA with stenosis in OM Redo redo CABG w SVG to OM, SVG to RCA8/10/00 Dr. Laneta Simmers   . Hyperlipidemia   . BPH (benign prostatic hypertrophy)   . Hypertensive heart disease     Change toprol to bisoprolol 10 mg daily on trial basis  05/21/2012  - notified by [facility] non adherent 07/17/2012    . Myocardial infarction 1979  . Chronic kidney disease stage III (GFR 30-59 ml/min)   . Congenital absence of kidney     "noted during AAA repair; never knew it before" (12/18/2012)  . LBBB (left bundle branch block)   . Second degree heart block 12/18/2012    s/p MDT Adapta L pacemaker 12-20-2012 by Dr Graciela Husbands    Review of Systems  Constitutional: Positive for fatigue. Negative for fever and unexpected weight change.  HENT:       Nose bleed late 03/2013  Respiratory: Positive for shortness of breath (chronic). Negative for cough.    Cardiovascular: Negative for chest pain and leg swelling.  Gastrointestinal: Negative for blood in stool.  Genitourinary: Negative for hematuria.  Neurological: Negative for dizziness and headaches.  Hematological: Bruises/bleeds easily.     Objective:   Physical Exam  BP 102/54  Pulse 83  Temp(Src) 98.4 F (36.9 C) (Oral)  Wt 190 lb 12.8 oz (86.546 kg)  SpO2 99% Wt Readings from Last 3 Encounters:  04/30/13 190 lb 12.8 oz (86.546 kg)  03/28/13 192 lb (87.091 kg)  12/20/12 182 lb 15.7 oz (83 kg)   Constitutional: He appears well-developed and well-nourished. No distress.  Neck: Normal range of motion. Neck supple. No JVD present. No thyromegaly present.  Cardiovascular: Normal rate, regular rhythm and normal heart sounds.  No murmur heard. Pulmonary/Chest: Effort normal and breath sounds normal. No respiratory distress. no wheezes.    Lab Results  Component Value Date   WBC 6.8 12/19/2012   HGB 10.7* 12/19/2012   HCT 32.6* 12/19/2012   PLT 136* 12/19/2012   CHOL 125 10/01/2012   TRIG 84 10/01/2012   HDL 40 10/01/2012   ALT 12 12/19/2012   AST 14 12/19/2012   NA 136 12/22/2012  K 4.8 12/22/2012   CL 104 12/22/2012   CREATININE 1.54* 12/22/2012   BUN 26* 12/22/2012   CO2 24 12/22/2012   TSH 1.589 12/18/2012   INR 1.10 12/18/2012        Assessment & Plan:   See problem list. Medications and labs reviewed today.

## 2013-04-30 NOTE — Assessment & Plan Note (Signed)
BP Readings from Last 3 Encounters:  04/30/13 102/54  03/28/13 110/60  12/22/12 111/58   The current medical regimen is effective;  continue present plan and medications

## 2013-04-30 NOTE — Assessment & Plan Note (Signed)
New dx on PPM interrogation 2014 Cardiology changed plavix to Eliquis because of same Will check CBC due to anemia hx (noted during PPM procedure 11/2012)

## 2013-04-30 NOTE — Assessment & Plan Note (Signed)
CrCl has been stable - Recheck now 

## 2013-04-30 NOTE — Assessment & Plan Note (Signed)
Follows with pulmonary for same Current symptoms controlled The current medical regimen is effective;  continue present plan and medications.

## 2013-04-30 NOTE — Progress Notes (Signed)
Pre-visit discussion using our clinic review tool. No additional management support is needed unless otherwise documented below in the visit note.  

## 2013-04-30 NOTE — Assessment & Plan Note (Signed)
Follows annually with uro for this and solitary kidney ([place]) Not tol of meds for BPH - but declines change in symptoms or overflow Will help with setting up appt as requested

## 2013-04-30 NOTE — Patient Instructions (Addendum)
It was good to see you today.  Medications reviewed, no changes at this time.  Refill on medication(s) as discussed today.   Test(s) ordered today. Your results will be released to MyChart (or called to you) after review, usually within 72hours after test completion. If any changes need to be made, you will be notified at that same time.  we'll make referral to [facility] urology . Our office will contact you regarding appointment(s) once made.  Please schedule followup in 6 months for annual exam and blood pressure check, call sooner if problems.  Atrial Fibrillation Atrial fibrillation is a condition that causes your heart to beat irregularly. It may also cause your heart to beat faster than normal. Atrial fibrillation can prevent your heart from pumping blood normally. It increases your risk of stroke and heart problems. HOME CARE  Take medications as told by your doctor.  Only take medications that your doctor says are safe. Some medications can make the condition worse or happen again.  If blood thinners were prescribed by your doctor, take them exactly as told. Too much can cause bleeding. Too little and you will not have the needed protection against stroke and other problems.  Perform blood tests at home if told by your doctor.  Perform blood tests exactly as told by your doctor.  Do not drink alcohol.  Do not drink beverages with caffeine such as coffee, soda, and some teas.  Maintain a healthy weight.  Do not use diet pills unless your doctor says they are safe. They may make heart problems worse.  Follow diet instructions as told by your doctor.  Exercise regularly as told by your doctor.  Keep all follow-up appointments. GET HELP RIGHT AWAY IF:   You have chest or belly (abdominal) pain.  You feel sick to your stomach (nauseous)  You suddenly have swollen feet and ankles.  You feel dizzy.  You face, arms, or legs feel numb or weak.  There is a change in  your vision or speech.  You notice a change in the speed, rhythm, or strength of your heartbeat.  You suddenly begin peeing (urinating) more often.  You get tired more easily when moving or exercising. MAKE SURE YOU:   Understand these instructions.  Will watch your condition.  Will get help right away if you are not doing well or get worse. Document Released: 02/23/2008 Document Revised: 09/10/2012 Document Reviewed: 06/26/2012 [facility] Patient Information 2014 [place], [place].

## 2013-06-28 ENCOUNTER — Encounter: Payer: Self-pay | Admitting: Cardiology

## 2013-06-28 NOTE — Progress Notes (Unsigned)
Patient ID: Edward [name], male   DOB: [birth year], 80 y.o.   MRN: [id number]   Edward, [name]  Date of visit:  06/28/2013 DOB:  May 17, 1930    Age:  80 yrs. Medical record number:  [id number]     Account number:  [id number] Primary Care Provider: LESCHBER,VALERIE ANN ____________________________ CURRENT DIAGNOSES  1. CAD,Native  2. Left bundle branch hemiblock  3. Arrhythmia-Atrial Fibrillation  4. Peripheral Vascular Disease  5. Chronic Kidney Disease (Stage 3)  6. Pacemaker/cardiac insitu  7. COPD  8. Aortic Valve Disorder  9. Hyperlipidemia  10. Hypertension,Essential (Benign)  11. MI-S/P Inferior  12. Gout  13. Carotid artery stenosis  14. Dyspnea  15. Surgery-Aortocoronary Bypass Grafting ____________________________ ALLERGIES  Ace Inhibitors, Dry cough  Clarithromycin, Intolerance-unknown  Codeine, Intolerance-unknown ____________________________ MEDICATIONS  1. multivitamin tablet, 3x week  2. Spiriva with HandiHaler 18 mcg capsule, w/inhalation device, 1 p.o. daily  3. temazepam 15 mg Capsule, QHS  4. Colcrys 0.6 mg tablet, PRN  5. furosemide 20 mg tablet, PRN swelling  6. Micardis 40 mg tablet, 1 p.o. daily  7. nitroglycerin 0.4 mg tablet, sublingual, PRN  8. Dulera 200-5 mcg/actuation HFA aerosol inhaler, 2 puff bid  9. aspirin 81 mg tablet,chewable, 1 p.o. daily  10. clopidogrel 75 mg tablet, 1 p.o. daily  11. Eliquis 2.5 mg tablet, BID  12. Crestor 20 mg tablet, 1 p.o. daily  13. Rapaflo 8 mg capsule, 1 p.o. daily ____________________________ CHIEF COMPLAINTS  Followup of Arrhythmia-Atrial Fibrillation  Followup of CAD,Native ____________________________ HISTORY OF PRESENT ILLNESS  Patient returns for cardiac followup. His breathing has improved since he had a pacemaker put in and he as tolerated Eliquus well. He had one episode where he might have had minor bleeding but for the most part has done well. He denies angina and has no PND, orthopnea, syncope, or  palpitations. He has mild claudication. ____________________________ PAST HISTORY  Past Medical Illnesses:  hypertension, hyperlipidemia, peripheral vascular disease, COPD, history of shingles, gout, GERD;  Cardiovascular Illnesses:  diastolic CHF, CAD, S/P MI-inferior, conduction disorder-LBBB, second degree heart block;  Surgical Procedures:  redo CABG w SVG to OM, SVG to RCA8/10/00 Dr. Cyndia Bent, CABG w LIMA to LAD, SVG to dx, OM, RCA 1989 Dr. Arlyce Dice, carotid endarterectomy-bil, cholecystectomy, hemorrhoidectomy, L carotid subclavian bypass, repair of anal fissure, AAA repair, R Shoulder surgery x2;  NYHA Classification:  II;  Canadian Angina Classification:  Class 0: Asymptomatic;  Cardiology Procedures-Invasive:  cardiac cath (left) 2000;  Cardiology Procedures-Noninvasive:  adenosine cardiolte March 2008, treadmill cardiolite June 2011, echocardiogram June 2014;  Cardiac Cath Results:  normal Left main, occluded LAD, occluded CFX, occluded RCA, occluded Diag 1 SVG, occluded RCA SVG, 99% stenosis CFX SVG, widely patent LAD LIMA graft;  LVEF of 55% documented via echocardiogram on 12/18/2012,   ____________________________ Caleen Essex TEST DATES EKG Date:  12/18/2012;   Cardiac Cath Date:  05/10/2007;  CABG: 01/07/1999;  Nuclear Study Date:  11/26/2009;  Echocardiography Date: 12/18/2012;  Chest Xray Date: 03/30/2012;   ____________________________ FAMILY HISTORY Brother -- Brother dead, Diabetes mellitus Brother -- Brother alive and well Father -- Father dead, Leukemia Mother -- Mother dead, Coronary Artery Disease Sister -- Sister alive and well Sister -- Sister dead Sister -- Sister alive with problem, Cancer Sister -- Sister alive and well Sister -- Sister dead, Cancer Sister -- Coronary Artery Disease ____________________________ SOCIAL HISTORY Alcohol Use:  beer;  Smoking:  used to smoke but quit 1998, greater than 50 pack year history;  Diet:  regular diet without modifications;   Lifestyle:  widower;  Exercise:  some exercise;  Occupation:  retired Astronomer;  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 Eyes: wears eye glasses/contact lenses, cataracts Ears, Nose, Throat, Mouth:  denies any hearing loss, epistaxis, hoarseness or difficulty speaking. Respiratory: mild dyspnea with exertion Cardiovascular:  please review HPI Abdominal: denies dyspepsia, GI bleeding, constipation, or diarrhea Genitourinary-Male: frequency, hesitancy, erectile dysfunction  Musculoskeletal:  arthritis of the right shoulder Neurological:  denies headaches, stroke, or TIA  ____________________________ PHYSICAL EXAMINATION VITAL SIGNS  Blood Pressure:  110/60 Sitting, Right arm, regular cuff  , 110/64 Standing, Right arm and regular cuff   Pulse:  86/min. Weight:  188.00 lbs. Height:  70"BMI: 27  Constitutional:  pleasant white male in no acute distress Skin:  warm and dry to touch, no apparent skin lesions, or masses noted. Head:  normocephalic, normal hair pattern, no masses or tenderness ENT:  ears, nose and throat reveal no gross abnormalities.  Dentition good. Neck:  bilateral carotid endarterectomy scars, no JVD, no bruits, no masses, non-tender Chest:  clear to auscultation, healed median sternotomy scar, healed pacemaker incision in the left pectoral area Cardiac:  regular rhythm, normal S1 and S2, no S3 or S4, grade 1/6 systolic murmur at aortic area radiating to neck Peripheral Pulses:  bilateral femoral bruits present, femoral pulses 2+, dorsalis pedis pulses diminished, posterior tibial pulses diminished Extremities & Back:  well healed saphenous vein donor site RLE, well healed saphenous vein donor site LLE, no edema present Neurological:  no gross motor or sensory deficits noted, affect appropriate, oriented x3. ____________________________ MOST RECENT LIPID  PANEL 10/01/12  CHOL TOTL 125 mg/dl, LDL 69 calc, HDL 40 mg/dl, TRIGLYCER 84 mg/dl and CHOL/HDL 3.2 (Calc) ____________________________ IMPRESSIONS/PLAN  1. Coronary artery disease with previous bypass grafting 2. Hypertension under control 3. Hyperlipidemia 4. History of intermittent atrial fibrillation 5. Long term anticoagulation with Eliquus  Recommendations:  Clinically is doing well on low-dose Eliquus. He is having no problems with that and I will see him back in followup in 6 months. Call if problems. ____________________________ TODAYS ORDERS  1. Return Visit: 6 months  2. 12 Lead EKG: 6 months                       ____________________________ Cardiology Physician:  Kerry Hough MD [facility]

## 2013-08-26 ENCOUNTER — Telehealth: Payer: Self-pay | Admitting: Internal Medicine

## 2013-08-26 NOTE — Telephone Encounter (Signed)
Spoke with pt. He has not been seen since 05/2012. Advised him that he would need to be seen in order to get refills. ROV has been scheduled for 08/27/13 10:45am.

## 2013-08-27 ENCOUNTER — Ambulatory Visit (INDEPENDENT_AMBULATORY_CARE_PROVIDER_SITE_OTHER): Payer: Medicare Other | Admitting: Internal Medicine

## 2013-08-27 ENCOUNTER — Encounter: Payer: Self-pay | Admitting: Internal Medicine

## 2013-08-27 VITALS — BP 112/66 | HR 80 | Temp 97.9°F | Ht 70.0 in | Wt 193.8 lb

## 2013-08-27 DIAGNOSIS — J449 Chronic obstructive pulmonary disease, unspecified: Secondary | ICD-10-CM

## 2013-08-27 MED ORDER — TIOTROPIUM BROMIDE MONOHYDRATE 18 MCG IN CAPS: 18.0000 ug | ORAL_CAPSULE | Freq: Every day | RESPIRATORY_TRACT | Status: DC

## 2013-08-27 MED ORDER — MOMETASONE FURO-FORMOTEROL FUM 200-5 MCG/ACT IN AERO: INHALATION_SPRAY | RESPIRATORY_TRACT | Status: DC

## 2013-08-27 NOTE — Assessment & Plan Note (Signed)
-   PFT's 11/15/05 FEV1 46% ratio 60 with 25% response to B2  - PFT's 01/25/06 FEV1 61%, ratio 46%, no better after B2  - PFT's 05/21/2012 FEV1  1.30 (50%) ratio 52 and no change p B2 and DLCO 66% corrects to 81 - HFA 75% p coaching 02/20/2012  > trial of dulera 200 2bid > 90% 05/21/2012    Adequate control on present rx, reviewed > no change in rx needed      Each maintenance medication was reviewed in detail including most importantly the difference between maintenance and as needed and under what circumstances the prns are to be used.  Please see instructions for details which were reviewed in writing and the patient given a copy.

## 2013-08-27 NOTE — Patient Instructions (Addendum)
Use your dulera and spiriva first thing in am   If you are satisfied with your treatment plan let your doctor know and he/she can either refill your medications or you can return here when your prescription runs out.     If in any way you are not 100% satisfied,  please tell us.  If 100% better, tell your friends!

## 2013-08-27 NOTE — Progress Notes (Signed)
Subjective:    Patient ID: Edward [name], male    DOB: Aug 24, 1929   MRN: [id number]     Brief patient profile:  84 yowm who quit smoking in 1998 with an FEV1 of 61% (GOLD II) recorded in August of 2007 at wt low 190s    History of Present Illness  June 19, 2008 ov to refill spiriva using primatene after exerts for faster recovery. no increase doe, cough or tendency to exacerbations. ex tol = > moderate adl's.  rec no change unless rescue need rises   06/13/2011 f/u ov/Imogean Ciampa cc no change doe, not very limited but not very active, no purulent sputum, not really limited from desired activities rec High doses of lopressor (Toprol/ metaprolol) can potentially block the advair benefits and both your blood pressure and pulse are very low today so I recommend you speak to Dr Wynonia Lawman at your convenience to adjust this medication to the lowest effective dose or consider alternatives (bisoprolol and bystolic which don't  Block the effects of advair). Pulmonary follow up can be as needed   06/21/2012 f/u ov/Desmin Daleo cc sob much better even 10 h p rx with dulera, attributes to change in B blocker, not using saba hfa at all. rec F/u prn   08/27/2013 f/u ov/Tamesha Ellerbrock re: copd GOLD III/IV Chief Complaint  Patient presents with  . Follow-up    Pt states breathing is doing well.  No new co's today.   Not limited by breathing from desired activities  - no need for saba  At all.    No obvious daytime variabilty or assoc chronic cough or cp or chest tightness, subjective wheeze overt sinus or hb symptoms. No unusual exp hx or h/o childhood pna/ asthma or premature birth to his knowledge.     Sleeping ok without nocturnal  or early am exacerbation  of respiratory  c/o's or need for noct saba. Also denies any obvious fluctuation of symptoms with weather or environmental changes or other aggravating or alleviating factors except as outlined above   ROS  The following are not active complaints unless bolded sore  throat, dysphagia, dental problems, itching, sneezing,  nasal congestion or excess/ purulent secretions, ear ache,   fever, chills, sweats, unintended wt loss, pleuritic or exertional cp, hemoptysis,  orthopnea pnd or leg swelling, presyncope, palpitations, heartburn, abdominal pain, anorexia, nausea, vomiting, diarrhea  or change in bowel or urinary habits, change in stools or urine, dysuria,hematuria,  rash, arthralgias, visual complaints, headache, numbness weakness or ataxia or problems with walking or coordination,  change in mood/affect or memory.            Past Medical History:  Hypertension  COPD  - PFT's 11/15/05 FEV1 46% ratio 60 with 25% response to B2  - PFT's 01/25/06 FEV1 61%, ratio 46%, no better after B2  - Add on Advair May 18, 2010 > improved June 29, 2010  Hyperlipidemia  Gout  Depression  CAD - s/p CABG '89, redo '00  Dyslipidemia  chronic LBBB  diastolic CHF  PVD s/p B CEA  MD roster:  pulm - Jason Frisbee  card - tilley             Objective:   Physical Exam  Pot bellied hoarse wm nad     wt   185 June 19, 2008 >   > 194 June 29, 2010  > 09/20/2010  189    > 02/20/2012  195 >   05/21/2012 194 > 06/21/2012 191 >08/27/2013 194  HEENT mild turbinate edema. Oropharynx no thrush or excess pnd or cobblestoning. No JVD or cervical adenopathy. Mild accessory muscle hypertrophy. Trachea midline, nl thryroid. Chest was hyperinflated by percussion with diminished breath sounds and moderate increased exp time without wheeze. Hoover sign positive at mid inspiration. Regular rate and rhythm without murmur gallop or rub or increase P2. No edema. Abd: no hsm, nl excursion. Ext warm without cyanosis or clubbing    cxr 03/30/12 Stable chest x-ray. No active lung disease. Stable mild  cardiomegaly.  Assessment & Plan:

## 2013-10-09 ENCOUNTER — Other Ambulatory Visit: Payer: Self-pay | Admitting: Internal Medicine

## 2013-10-30 ENCOUNTER — Encounter: Payer: Self-pay | Admitting: Internal Medicine

## 2013-10-30 ENCOUNTER — Ambulatory Visit (INDEPENDENT_AMBULATORY_CARE_PROVIDER_SITE_OTHER): Payer: Medicare Other | Admitting: Internal Medicine

## 2013-10-30 ENCOUNTER — Other Ambulatory Visit (INDEPENDENT_AMBULATORY_CARE_PROVIDER_SITE_OTHER): Payer: Medicare Other

## 2013-10-30 VITALS — BP 110/52 | HR 78 | Temp 97.9°F | Wt 192.4 lb

## 2013-10-30 DIAGNOSIS — N183 Chronic kidney disease, stage 3 unspecified: Secondary | ICD-10-CM

## 2013-10-30 DIAGNOSIS — E785 Hyperlipidemia, unspecified: Secondary | ICD-10-CM

## 2013-10-30 DIAGNOSIS — R5381 Other malaise: Secondary | ICD-10-CM

## 2013-10-30 DIAGNOSIS — I2510 Atherosclerotic heart disease of native coronary artery without angina pectoris: Secondary | ICD-10-CM

## 2013-10-30 DIAGNOSIS — I4891 Unspecified atrial fibrillation: Secondary | ICD-10-CM

## 2013-10-30 DIAGNOSIS — R5383 Other fatigue: Secondary | ICD-10-CM

## 2013-10-30 DIAGNOSIS — N400 Enlarged prostate without lower urinary tract symptoms: Secondary | ICD-10-CM

## 2013-10-30 DIAGNOSIS — H5789 Other specified disorders of eye and adnexa: Secondary | ICD-10-CM

## 2013-10-30 LAB — CBC WITH DIFFERENTIAL/PLATELET
Basophils Absolute: 0.1 10*3/uL (ref 0.0–0.1)
Basophils Relative: 0.7 % (ref 0.0–3.0)
EOS PCT: 3.3 % (ref 0.0–5.0)
Eosinophils Absolute: 0.3 10*3/uL (ref 0.0–0.7)
HEMATOCRIT: 35.4 % — AB (ref 39.0–52.0)
HEMOGLOBIN: 11.8 g/dL — AB (ref 13.0–17.0)
LYMPHS ABS: 1.5 10*3/uL (ref 0.7–4.0)
Lymphocytes Relative: 18.1 % (ref 12.0–46.0)
MCHC: 33.2 g/dL (ref 30.0–36.0)
MCV: 93.3 fl (ref 78.0–100.0)
MONO ABS: 0.8 10*3/uL (ref 0.1–1.0)
MONOS PCT: 10.1 % (ref 3.0–12.0)
NEUTROS ABS: 5.4 10*3/uL (ref 1.4–7.7)
Neutrophils Relative %: 67.8 % (ref 43.0–77.0)
Platelets: 156 10*3/uL (ref 150.0–400.0)
RBC: 3.8 Mil/uL — ABNORMAL LOW (ref 4.22–5.81)
RDW: 14.3 % (ref 11.5–15.5)
WBC: 8 10*3/uL (ref 4.0–10.5)

## 2013-10-30 LAB — HEPATIC FUNCTION PANEL
ALBUMIN: 3.8 g/dL (ref 3.5–5.2)
ALT: 16 U/L (ref 0–53)
AST: 21 U/L (ref 0–37)
Alkaline Phosphatase: 65 U/L (ref 39–117)
Bilirubin, Direct: 0.2 mg/dL (ref 0.0–0.3)
Total Bilirubin: 0.8 mg/dL (ref 0.2–1.2)
Total Protein: 6.5 g/dL (ref 6.0–8.3)

## 2013-10-30 LAB — BASIC METABOLIC PANEL
BUN: 28 mg/dL — ABNORMAL HIGH (ref 6–23)
CALCIUM: 9.5 mg/dL (ref 8.4–10.5)
CO2: 28 mEq/L (ref 19–32)
Chloride: 106 mEq/L (ref 96–112)
Creatinine, Ser: 1.9 mg/dL — ABNORMAL HIGH (ref 0.4–1.5)
GFR: 37.36 mL/min — AB (ref >60.00)
GLUCOSE: 113 mg/dL — AB (ref 70–99)
POTASSIUM: 4.9 meq/L (ref 3.5–5.1)
SODIUM: 138 meq/L (ref 135–145)

## 2013-10-30 LAB — LIPID PANEL
CHOLESTEROL: 122 mg/dL (ref 0–200)
HDL: 50.3 mg/dL (ref >39.00)
LDL Cholesterol: 53 mg/dL (ref 0–99)
NONHDL: 71.7
Total CHOL/HDL Ratio: 2
Triglycerides: 95 mg/dL (ref 0.0–149.0)
VLDL: 19 mg/dL (ref 0.0–40.0)

## 2013-10-30 LAB — HM DIABETES EYE EXAM

## 2013-10-30 LAB — TSH: TSH: 2.63 u[IU]/mL (ref 0.35–4.50)

## 2013-10-30 NOTE — Assessment & Plan Note (Signed)
Follows annually with uro for this and solitary kidney ([place]) Has recently begin generic flomax for same - follow up as planned

## 2013-10-30 NOTE — Patient Instructions (Signed)
It was good to see you today.  We have reviewed your prior records including labs and tests today  Test(s) ordered today. Your results will be released to [place] (or called to you) after review, usually within 72hours after test completion. If any changes need to be made, you will be notified at that same time.  Medications reviewed and updated, no changes recommended at this time.  we'll make referral to eye doctor. Our office will contact you regarding appointment(s) once made.  Please schedule followup in 6 months for semi annual review, call sooner if problems.

## 2013-10-30 NOTE — Assessment & Plan Note (Signed)
Hx reviewed No anginal symptoms Continue medical mgmt as ongoing and follow up with cards annually as planned

## 2013-10-30 NOTE — Assessment & Plan Note (Signed)
On statin - follows labs annually for same The current medical regimen is effective;  continue present plan and medications.

## 2013-10-30 NOTE — Assessment & Plan Note (Signed)
New dx on PPM interrogation 2014 Cardiology changed plavix to Eliquis because of same

## 2013-10-30 NOTE — Assessment & Plan Note (Signed)
CrCl has been stable - Recheck now

## 2013-10-30 NOTE — Progress Notes (Signed)
Pre visit review using our clinic review tool, if applicable. No additional management support is needed unless otherwise documented below in the visit note. 

## 2013-10-30 NOTE — Progress Notes (Signed)
Subjective:    Patient ID: Edward [name], male    DOB: [birth year], 80 y.o.   MRN: [id number]  HPI  Patient here for follow up Reviewed chronic medical issues and interval medical events  Past Medical History  Diagnosis Date  . Gout     "only once in my lifetime" (12/18/2012)  . Depression   . PVD (peripheral vascular disease)     s/p B CEA  . COPD (chronic obstructive pulmonary disease)      PFT 6.19.07: FEV1 465  ratio 60 with 25% response to B2.  > PFTs 8.29.07: FEV1 61%  ratio 46% no better after B2.  > add on advair 12.20.11-improved 1.31.12  . AAA (abdominal aortic aneurysm)   . Melanosis coli   . Internal hemorrhoids   . Carotid artery occlusion   . Aortic valve disorder   . Chronic diastolic heart failure 67/05/2456  . CAD (coronary artery disease), native coronary artery     CABG w LIMA to LAD, SVG to dx, OM, RCA 1989 Dr. Arlyce Dice for 3VD PTCA of OM, 1999 and 2000 Cath showed occlusion of left main and RCA with stenosis in OM Redo redo CABG w SVG to OM, SVG to RCA8/10/00 Dr. Cyndia Bent   . Hyperlipidemia   . BPH (benign prostatic hypertrophy)   . Hypertensive heart disease     Change toprol to bisoprolol 10 mg daily on trial basis  05/21/2012  - [facility] 07/17/2012    . Myocardial infarction 1979  . Chronic kidney disease stage III (GFR 30-59 ml/min)   . Congenital absence of kidney     "noted during AAA repair; never knew it before" (12/18/2012)  . LBBB (left bundle branch block)   . Second degree heart block 12/18/2012    s/p MDT Adapta L pacemaker 12-20-2012 by Dr Caryl Comes    Review of Systems  Constitutional: Positive for fatigue. Negative for fever and unexpected weight change.  Eyes: Positive for discharge (L eye x 2 mo) and itching (L eye x 1-2 mo). Negative for pain and visual disturbance.  Respiratory: Negative for cough and shortness of breath.   Cardiovascular: Negative for chest pain and leg swelling.       Objective:   Physical Exam  BP  110/52  Pulse 78  Temp(Src) 97.9 F (36.6 C) (Oral)  Wt 192 lb 6.4 oz (87.272 kg)  SpO2 98% Wt Readings from Last 3 Encounters:  10/30/13 192 lb 6.4 oz (87.272 kg)  08/27/13 193 lb 12.8 oz (87.907 kg)  04/30/13 190 lb 12.8 oz (86.546 kg)   Constitutional: he appears well-developed and well-nourished. No distress.  Eyes: L eye with clear tearing - no conjunctivitis - B vision grossly normal Neck: Normal range of motion. Neck supple. No JVD present. No thyromegaly present.  Cardiovascular: Normal rate, regular rhythm and normal heart sounds.  No murmur heard. No BLE edema. Pulmonary/Chest: Effort normal and breath sounds normal. No respiratory distress. he has no wheezes.  Psychiatric: he has a normal mood and affect. His behavior is normal. Judgment and thought content normal.   Lab Results  Component Value Date   WBC 7.1 04/30/2013   HGB 11.5* 04/30/2013   HCT 34.5* 04/30/2013   PLT 187.0 04/30/2013   GLUCOSE 95 04/30/2013   CHOL 125 10/01/2012   TRIG 84 10/01/2012   HDL 40 10/01/2012   LDLCALC 69 10/01/2012   ALT 12 12/19/2012   AST 14 12/19/2012   NA 138 04/30/2013  K 5.6* 04/30/2013   CL 106 04/30/2013   CREATININE 2.1* 04/30/2013   BUN 38* 04/30/2013   CO2 26 04/30/2013   TSH 1.589 12/18/2012   INR 1.10 12/18/2012    X-ray Chest Pa And Lateral  12/18/2012   *RADIOLOGY REPORT*  Clinical Data: Dyspnea  CHEST - 2 VIEW  Comparison: 03/30/2012  Findings: The patient is status post median sternotomy and CABG. Heart and mediastinal contours are stable with ectasia of the thoracic aorta and some prominence of the central pulmonary arterial segments again identified.  The lung fields demonstrate stable scarring in the lingula.  No new focal infiltrates or signs of congestive failure seen.  No pleural fluid or significant peribronchial cuffing is noted.  Surgical clips are seen in both neck regions and mediastinum.  The patient is status post right shoulder arthroplasty.  IMPRESSION: Stable  cardiopulmonary appearance with no new focal or acute abnormality identified   Original Report Authenticated By: Ponciano Ort, M.D.       Assessment & Plan:   Left eye tearing and itching 6-8 weeks, no specific abnormality on exam or evidence for visual acuity change -Will arrange for evaluation by ophthalmology specialist  Fatigue - nonspecific symptoms/exam - check screening labs  Problem List Items Addressed This Visit   Atrial fibrillation (Chronic)     New dx on PPM interrogation 2014 Cardiology changed plavix to Eliquis because of same    Relevant Medications      lisinopril (PRINIVIL,ZESTRIL) 40 MG tablet   BPH (benign prostatic hypertrophy) (Chronic)     Follows annually with uro for this and solitary kidney ([place]) Has recently begin generic flomax for same - follow up as planned    CAD (coronary artery disease), native coronary artery - Primary (Chronic)     Hx reviewed No anginal symptoms Continue medical mgmt as ongoing and follow up with cards annually as planned    Relevant Medications      lisinopril (PRINIVIL,ZESTRIL) 40 MG tablet   Other Relevant Orders      Lipid panel   Chronic kidney disease stage III (GFR 30-59 ml/min) (Chronic)     CrCl has been stable - Recheck now    Hyperlipidemia (Chronic)     On statin - follows labs annually for same The current medical regimen is effective;  continue present plan and medications.    Relevant Medications      lisinopril (PRINIVIL,ZESTRIL) 40 MG tablet   Other Relevant Orders      Lipid panel    Other Visit Diagnoses   Discharge of left eye        Relevant Orders       Ambulatory referral to Ophthalmology    Fatigue        Relevant Orders       Basic metabolic panel       CBC with Differential       Hepatic function panel       TSH

## 2013-10-31 ENCOUNTER — Encounter: Payer: Self-pay | Admitting: Internal Medicine

## 2013-11-07 ENCOUNTER — Other Ambulatory Visit: Payer: Self-pay | Admitting: Internal Medicine

## 2013-11-07 NOTE — Telephone Encounter (Signed)
Faxed script back to [facility]...lmb 

## 2013-11-18 ENCOUNTER — Ambulatory Visit
Admission: RE | Admit: 2013-11-18 | Discharge: 2013-11-18 | Disposition: A | Discharge status: Home / Self Care | Payer: Medicare Other | Source: Ambulatory Visit | Attending: Cardiology | Admitting: Cardiology

## 2013-11-18 ENCOUNTER — Other Ambulatory Visit: Payer: Self-pay | Admitting: Cardiology

## 2013-11-18 DIAGNOSIS — R0609 Other forms of dyspnea: Secondary | ICD-10-CM

## 2013-11-18 DIAGNOSIS — R0989 Other specified symptoms and signs involving the circulatory and respiratory systems: Principal | ICD-10-CM

## 2013-12-10 ENCOUNTER — Encounter: Payer: Self-pay | Admitting: Internal Medicine

## 2013-12-10 ENCOUNTER — Ambulatory Visit (INDEPENDENT_AMBULATORY_CARE_PROVIDER_SITE_OTHER): Payer: Medicare Other | Admitting: Internal Medicine

## 2013-12-10 ENCOUNTER — Other Ambulatory Visit (INDEPENDENT_AMBULATORY_CARE_PROVIDER_SITE_OTHER): Payer: Medicare Other

## 2013-12-10 ENCOUNTER — Telehealth: Payer: Self-pay | Admitting: Internal Medicine

## 2013-12-10 ENCOUNTER — Other Ambulatory Visit: Payer: Self-pay | Admitting: Internal Medicine

## 2013-12-10 VITALS — BP 110/62 | HR 85 | Temp 97.1°F | Wt 191.4 lb

## 2013-12-10 DIAGNOSIS — M7918 Myalgia, other site: Secondary | ICD-10-CM

## 2013-12-10 DIAGNOSIS — IMO0001 Reserved for inherently not codable concepts without codable children: Secondary | ICD-10-CM

## 2013-12-10 DIAGNOSIS — I2510 Atherosclerotic heart disease of native coronary artery without angina pectoris: Secondary | ICD-10-CM

## 2013-12-10 LAB — SEDIMENTATION RATE: Sed Rate: 62 mm/hr — ABNORMAL HIGH (ref 0–22)

## 2013-12-10 MED ORDER — MOMETASONE FURO-FORMOTEROL FUM 200-5 MCG/ACT IN AERO: INHALATION_SPRAY | RESPIRATORY_TRACT | Status: DC

## 2013-12-10 MED ORDER — TIOTROPIUM BROMIDE MONOHYDRATE 18 MCG IN CAPS: 18.0000 ug | ORAL_CAPSULE | Freq: Every day | RESPIRATORY_TRACT | Status: DC

## 2013-12-10 MED ORDER — PREDNISONE 5 MG PO TABS: ORAL_TABLET | ORAL | Status: DC

## 2013-12-10 MED ORDER — METHOCARBAMOL 500 MG PO TABS: 500.0000 mg | ORAL_TABLET | Freq: Four times a day (QID) | ORAL | Status: DC

## 2013-12-10 NOTE — Progress Notes (Signed)
Pre visit review using our clinic review tool, if applicable. No additional management support is needed unless otherwise documented below in the visit note. 

## 2013-12-10 NOTE — Patient Instructions (Signed)
Use a cervical memory foam pillow to prevent hyperextension or hyperflexion of the cervical spine.  External eye cleansing in the shower as discussed with lather of mild shampoo.After 10 seconds wash off lather . Make sure that all residual soap is removed to prevent irritation.   The Physical Therapy  referral will be scheduled and you'll be notified of the time.

## 2013-12-10 NOTE — Progress Notes (Signed)
   Subjective:    Patient ID: Edward [name], male    DOB: [birth year], 80 y.o.   MRN: [id number]  HPI Patient started having neck pain on 12/04/13 (about 6 days ago). He woke up with a left side neck cramp and it has moved to both sides. He denies radiating pain. He states it feels intense, cramping, and 9/10 in severity. It is constant and worse with turning his head. His ROM is restricted. He has tried hot water in the shower, a cold collar, sleeping with a twisted towel under his neck, and 4 BC powders which have all have been minimally helpful.    Review of Systems     Objective:   Physical Exam        Assessment & Plan:

## 2013-12-10 NOTE — Progress Notes (Signed)
   Subjective:    Patient ID: Edward [name], male    DOB: Mar 11, 1930, 80 y.o.   MRN: [id number]  HPI  He awoke 12/04/13 with pain in the posterior neck which he describes as intense and constant. It is severe as a level IX.  There was no trigger or repetitive motion for this. He does sit in a chair in his kitchen which does not support the neck.  The pain is worse when he turns his head in any direction. Otherwise it is a dull low-grade discomfort.  Hot water in the shower and and cold application are of partial benefit. BC powders were taken on 4 occasions without benefit  He's had some mild shoulder discomfort. He has a past history of shoulder fracture.    Review of Systems  He denies fever, chills, or unexplained weight loss  He does have excessive fatigue which he relates to his heart  He has no blurred vision, double vision, or loss of vision  There no associated headaches  Shortness of breath is chronic; he has a followup with his cardiologist  He bruisests because he is on a novel oral anticoagulant as well as aspirin.  He has had some itchy watery eyes on the left. He is seen an ophthalmologist who prescribed eyedrops. This did help but has recurred.   No rhinosinusitis symptoms present.     Objective:   Physical Exam   Significant or distinguishing  findings on physical exam are documented first.  Below that are other systems examined & findings.   The left eye is excessively watery without purulence or conjunctivitis. He is hoarse which is chronic problem. There is profoundly decreased range of motion of the neck. He has mixed degenerative changes in this hands. Crepitus @ shoulders w/o pain with ROM. Subclinical scoliosis suggested by the asymmetry of the lower thoracic spine. Heart rhythm is irregular and heart sounds are distant. Breath sounds are decreased; there is no increased work of breathing. No definite neuromuscular deficit is present in the  upper extremities.  General appearance is one of adequate nourishment w/o distress. Eyes: No r scleral icterus is present. Oral exam: lips and gums are healthy appearing.There is no oropharyngeal erythema or exudate noted.  Lungs:No increased work of breathing.  Musculoskeletal: Gait normal. Skin:Warm & dry.  Intact without suspicious lesions or rashes ; no jaundice or tenting. Lymphatic: No lymphadenopathy is noted about the head, neck, axilla              Assessment & Plan:  #1 cervical spine pain, doubt PMR See orders & AVS

## 2013-12-10 NOTE — Telephone Encounter (Signed)
lmtcb x1-- rx's [facility]

## 2013-12-11 NOTE — Telephone Encounter (Signed)
Nothing further needed 

## 2013-12-24 ENCOUNTER — Encounter: Payer: Self-pay | Admitting: Internal Medicine

## 2013-12-24 ENCOUNTER — Ambulatory Visit (INDEPENDENT_AMBULATORY_CARE_PROVIDER_SITE_OTHER): Payer: Medicare Other | Admitting: Internal Medicine

## 2013-12-24 VITALS — BP 110/62 | HR 64 | Ht 71.0 in | Wt 192.0 lb

## 2013-12-24 DIAGNOSIS — I2510 Atherosclerotic heart disease of native coronary artery without angina pectoris: Secondary | ICD-10-CM

## 2013-12-24 DIAGNOSIS — Z950 Presence of cardiac pacemaker: Secondary | ICD-10-CM

## 2013-12-24 DIAGNOSIS — I480 Paroxysmal atrial fibrillation: Secondary | ICD-10-CM

## 2013-12-24 DIAGNOSIS — I4891 Unspecified atrial fibrillation: Secondary | ICD-10-CM

## 2013-12-24 DIAGNOSIS — I441 Atrioventricular block, second degree: Secondary | ICD-10-CM

## 2013-12-24 LAB — MDC_IDC_ENUM_SESS_TYPE_INCLINIC
Battery Impedance: 130 Ohm
Battery Voltage: 2.78 V
Brady Statistic AP VP Percent: 1 %
Brady Statistic AP VS Percent: 0 %
Brady Statistic AS VS Percent: 30 %
Lead Channel Impedance Value: 512 Ohm
Lead Channel Pacing Threshold Amplitude: 0.75 V
Lead Channel Pacing Threshold Pulse Width: 0.4 ms
Lead Channel Sensing Intrinsic Amplitude: 2 mV
Lead Channel Setting Pacing Amplitude: 2 V
Lead Channel Setting Pacing Amplitude: 2.5 V
Lead Channel Setting Pacing Pulse Width: 0.4 ms
Lead Channel Setting Sensing Sensitivity: 5.6 mV
MDC IDC MSMT BATTERY REMAINING LONGEVITY: 128 mo
MDC IDC MSMT LEADCHNL RV IMPEDANCE VALUE: 606 Ohm
MDC IDC SESS DTM: 20150728163753
MDC IDC STAT BRADY AS VP PERCENT: 69 %

## 2013-12-24 NOTE — Progress Notes (Signed)
Patient Care Team: Rowe Clack, MD as PCP - General Tanda Rockers, MD as Consulting Physician (Pulmonary Disease) Jacolyn Reedy, MD as Consulting Physician (Cardiology) Ladene Artist, MD as Consulting Physician (Gastroenterology) Jorja Loa, MD (Urology)   HPI  Edward [name] is a 80 y.o. male Seen in followup for pacemaker implanted 7/14 for intermittent high-grade heart block without a reversible cause.  He has a history of ischemic heart disease with prior bypass surgery  He is much better following pacemaker.   He has been identified as having atrial fibrillation and amiodarone has been initiated with anticoagulation  he is to be following up with Dr. Wynonia Lawman on Friday to consummated plans of which he is currently uncertain  He is unaware as to whether his peripheral edema is chronic or not  Past Medical History  Diagnosis Date  . Gout     "only once in my lifetime" (12/18/2012)  . Depression   . PVD (peripheral vascular disease)     s/p B CEA  . COPD (chronic obstructive pulmonary disease)      PFT 6.19.07: FEV1 465  ratio 60 with 25% response to B2.  > PFTs 8.29.07: FEV1 61%  ratio 46% no better after B2.  > add on advair 12.20.11-improved 1.31.12  . AAA (abdominal aortic aneurysm)   . Melanosis coli   . Internal hemorrhoids   . Carotid artery occlusion   . Aortic valve disorder   . Chronic diastolic heart failure 57/0/1779  . CAD (coronary artery disease), native coronary artery     CABG w LIMA to LAD, SVG to dx, OM, RCA 1989 Dr. Arlyce Dice for 3VD PTCA of OM, 1999 and 2000 Cath showed occlusion of left main and RCA with stenosis in OM Redo redo CABG w SVG to OM, SVG to RCA8/10/00 Dr. Cyndia Bent   . Hyperlipidemia   . BPH (benign prostatic hypertrophy)   . Hypertensive heart disease     Change toprol to bisoprolol 10 mg daily on trial basis  05/21/2012  - [facility] 07/17/2012    . Myocardial infarction 1979  . Chronic kidney  disease stage III (GFR 30-59 ml/min)   . Congenital absence of kidney     "noted during AAA repair; never knew it before" (12/18/2012)  . LBBB (left bundle branch block)   . Second degree heart block 12/18/2012    s/p MDT Adapta L pacemaker 12-20-2012 by Dr Caryl Comes    Past Surgical History  Procedure Laterality Date  . Coronary artery bypass graft  1989, 2000     "CABG X ?3; CABG X 5 w//Bartle" (12/18/2012)  . Cholecystectomy  1998  . Hemiarthroplasty shoulder fracture Right ~ 2008     x 2 - [place]  . Carotid endarterectomy Bilateral 1990's  . Abdominal aortic aneurysm repair  1998  . Carotid-subclavian bypass graft Left   . Anal fissure repair      12/18/2012 "I don't remember this"  . Hemorroidectomy      12/18/2012 "I don't remember this"  . Carpal tunnel release Right 1980's    "Dr. Marrian Salvage" (12/18/2012)  . Cardiac catheterization      "2 or 3" (12/18/2012)  . Coronary angioplasty      "a few" (12/18/2012)  . Coronary angioplasty with stent placement      "I've got 1 or 2" (12/18/2012)  . Joint replacement    . Hip fracture surgery  1944    "  fell out of a tree; had it operated on 3 times" (12/18/2012)  . Refractive surgery Bilateral 1990's?  . Pacemaker insertion  12-20-2012    dual chamber Medtronic Adapta L pacemaker implanted by Dr Caryl Comes    Current Outpatient Prescriptions  Medication Sig Dispense Refill  . amiodarone (PACERONE) 200 MG tablet Take 200 mg by mouth 2 (two) times daily.      Marland Kitchen aspirin 81 MG tablet Take 81 mg by mouth daily.        . colchicine (COLCRYS) 0.6 MG tablet take 1 tablet by mouth PRN      . ELIQUIS 2.5 MG TABS tablet Take 1 by mouth twice day      . mometasone-formoterol (DULERA) 200-5 MCG/ACT AERO Take 2 puffs first thing in am and then another 2 puffs about 12 hours later.  3 Inhaler  3  . nitroGLYCERIN (NITROSTAT) 0.4 MG SL tablet Place 0.4 mg under the tongue every 5 (five) minutes as needed for chest pain.      . rosuvastatin (CRESTOR) 20 MG  tablet Take 20 mg by mouth daily.        . temazepam (RESTORIL) 15 MG capsule take 1 capsule by mouth at bedtime  30 capsule  5  . tiotropium (SPIRIVA) 18 MCG inhalation capsule Place 1 capsule (18 mcg total) into inhaler and inhale daily.  90 capsule  3   No current facility-administered medications for this visit.    Allergies  Allergen Reactions  . Ace Inhibitors Cough  . Clarithromycin   . Codeine   . Oxycodone-Acetaminophen     Review of Systems negative except from HPI and PMH  Physical Exam BP 110/62  Pulse 64  Ht 5\' 11"  (1.803 m)  Wt 192 lb (87.091 kg)  BMI 26.79 kg/m2 Well developed and well nourished in no acute distress HENT normal E scleral and icterus clear Neck Supple JVP 6-7 cm carotids brisk and full Clear to ausculation  *Regular rate and rhythm, no murmurs gallops or rub Soft with active bowel sounds No clubbing cyanosis 2+ Edema Alert and oriented, grossly normal motor and sensory function Skin Warm and Dry  ECG demonstrates ventricular pacing with underlying what appeared to be typical atrial flutter  Assessment and  Plan  Congestive heart failure  Complete heart block   Pacemaker-Medtronic The patient's device was interrogated.  The information was reviewed. No changes were made in the programming.     Atrial flutter the patient is on high-dose amiodarone recently initiated in the context of having had identified atrial fibrillation? Atrial flutter with the anticipation, from what I gather, of cardioversion as he has symptoms of dyspnea on exertion and manifestations of volume overload.  I will defer the management of amiodarone to Dr. Wynonia Lawman. Pacemaker function is normal.  I would ask, however, with her previous ECGs are consistent with atrial flutter. If that is the case, consideration could be given to catheter ablation as opposed to the use of amiodarone. Furthermore, if his rhythms have been atrial flutter although chart describes atrial  fibrillation it is possible that his anticoagulation can be discontinued  Is a volume overload in the context of previously normal LV function. I would presume that this is HFpEF. The patient is currently not her diuretic. To see Dr. Wynonia Lawman on Friday. I will defer initiation of diuretic therapy to him.

## 2013-12-24 NOTE — Patient Instructions (Signed)
Your physician recommends that you continue on your current medications as directed. Please refer to the Current Medication list given to you today.  Follow up as needed with Dr. Caryl Comes.

## 2013-12-27 ENCOUNTER — Other Ambulatory Visit: Payer: Self-pay | Admitting: Cardiology

## 2013-12-27 ENCOUNTER — Encounter (HOSPITAL_COMMUNITY): Payer: Self-pay | Admitting: Pharmacy Technician

## 2013-12-27 NOTE — H&P (Signed)
Edward [name], Edward [name]  Date of visit:  12/27/2013 DOB:  1930/04/19    Age:  80 yrs. Medical record number:  [id number]     Account number:  [id number] Primary Care Provider: [facility] ____________________________ CURRENT DIAGNOSES  1. Pre-Op Cardiovascular Exam  2. Arrhythmia-Atrial Fibrillation  3. Peripheral Vascular Disease  4. Chronic Kidney Disease (Stage 3)  5. CAD,Native  6.  Pacemaker/cardiac insitu  7. Aortic Valve Disorder  8. Left Bundle Branch Block Nos  9. Hypertension,Essential (Benign)  10. COPD  1. Hyperlipidemia  12. MI-S/P Inferior  13. Gout  14. Carotid artery stenosis  15. Dyspnea  16. Surgery-Aortocoronary Bypass Grafting ____________________________ ALLERGIES  Ace Inhibitors, Dry cough  Clarithromycin, Intolerance-unknown  Codeine, Intolerance-unknown ____________________________ MEDICATIONS  1. Spiriva with HandiHaler 18 mcg capsule, w/inhalation device, 1 p.o. daily  2. temazepam 15 mg Capsule, QHS  3. Colcrys 0.6 mg tablet, PRN  4. furosemide 20 mg tablet, PRN swelling  5. nitroglycerin 0.4 mg tablet, sublingual, PRN  6. Dulera 200-5 mcg/actuation HFA aerosol inhaler, 2 puff bid  7. aspirin 81 mg tablet,chewable, 1 p.o. daily  8. Rapaflo 8 mg capsule, 1 p.o. daily  9. amiodarone 200 mg tablet, BID  10. Crestor 20 mg tablet, 1 p.o. daily  11. Eliquis 2.5 mg tablet, BID  12. lisinopril 40 mg tablet, 1 p.o. daily ____________________________ CHIEF COMPLAINTS  Ankle edema  Followup of Pre-Op Cardiovascular Exam ____________________________ HISTORY OF PRESENT ILLNESS  Patient returns for cardiac followup. He remains in atrial flutter. He was seen by Dr. Jens Som recently also noted that he was in atrial flutter. He has been taking Eliquus twice a day for the past month without missing doses. He has been tolerating amiodarone fairly well. He is still fatigued and does have some leg edema that may be due to the atrial arrhythmia. He has mild dyspnea. He  is using furosemide as needed for swelling. He denies PND orthopnea. He is not using nitroglycerin. ____________________________ PAST HISTORY  Past Medical Illnesses:  hypertension, hyperlipidemia, peripheral vascular disease, COPD, history of shingles, gout, GERD;  Cardiovascular Illnesses:  diastolic CHF, CAD, S/P MI-inferior, conduction disorder-LBBB, second degree heart block, atrial fibrillation;  Surgical Procedures:  redo CABG w SVG to OM, SVG to RCA8/10/00 Dr. Cyndia Bent, CABG w LIMA to LAD, SVG to dx, OM, RCA 1989 Dr. Arlyce Dice, carotid endarterectomy-bil, cholecystectomy, hemorrhoidectomy, [name] carotid subclavian bypass, repair of anal fissure, AAA repair, R Shoulder surgery x2;  NYHA Classification:  II;  Cardiology Procedures-Invasive:  cardiac cath (left) 2000;  Cardiology Procedures-Noninvasive:  adenosine cardiolte March 2008, treadmill cardiolite June 2011, echocardiogram June 2014, echocardiogram July 2015;  Cardiac Cath Results:  normal Left main, occluded LAD, occluded CFX, occluded RCA, occluded Diag 1 SVG, occluded RCA SVG, 99% stenosis CFX SVG, widely patent LAD LIMA graft;  LVEF of 50% documented via echocardiogram on 12/04/2013,   ____________________________ CARDIO-PULMONARY TEST DATES EKG Date:  11/18/2013;   Cardiac Cath Date:  05/10/2007;  CABG: 01/07/1999;  Nuclear Study Date:  11/26/2009;  Echocardiography Date: 12/04/2013;  Chest Xray Date: 11/18/2013;   ____________________________ FAMILY HISTORY Brother -- Brother dead, Diabetes mellitus Brother -- Brother alive and well Father -- Father dead, Leukemia Mother -- Mother dead, Coronary Artery Disease Sister -- Sister alive and well Sister -- Sister dead Sister -- Sister alive with problem, Cancer Sister -- Sister alive and well Sister -- Sister dead, Cancer Sister -- Coronary Artery Disease ____________________________ SOCIAL HISTORY Alcohol Use:  beer;  Smoking:  used to smoke but quit 1998,  greater than 50 pack year  history;  Diet:  regular diet without modifications;  Lifestyle:  widower;  Exercise:  some exercise;  Occupation:  retired Astronomer;  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 Eyes: wears eye glasses/contact lenses, cataracts Ears, Nose, Throat, Mouth:  denies any hearing loss, epistaxis, hoarseness or difficulty speaking. Respiratory: dyspnea with exertion Cardiovascular:  please review HPI Abdominal: denies dyspepsia, GI bleeding, constipation, or diarrhea Genitourinary-Male: frequency, hesitancy, erectile dysfunction  Musculoskeletal:  arthritis of the right shoulder Neurological:  denies headaches, stroke, or TIA  ____________________________ PHYSICAL EXAMINATION VITAL SIGNS  Blood Pressure:  82/50 Sitting, Right arm, regular cuff  , 86/52 Standing, Right arm and regular cuff   Pulse:  88/min. Weight:  191.00 lbs. Height:  70"BMI: 27  Constitutional:  pleasant white male in no acute distress Skin:  warm and dry to touch, no apparent skin lesions, or masses noted. Head:  normocephalic, normal hair pattern, no masses or tenderness ENT:  ears, nose and throat reveal no gross abnormalities.  Dentition good. Neck:  bilateral carotid endarterectomy scars, no JVD, no bruits, no masses, non-tender Chest:  clear to auscultation, healed median sternotomy scar, healed pacemaker incision in the left pectoral area Cardiac:  irregular rhythm, normal S1 and S2, no S3 or S4, grade 1/6 systolic murmur at aortic area radiating to neck Peripheral Pulses:  bilateral femoral bruits present, femoral pulses 2+, dorsalis pedis pulses diminished, posterior tibial pulses diminished Extremities & Back:  well healed saphenous vein donor site RLE, well healed saphenous vein donor site LLE, no edema present Neurological:  no gross motor or sensory deficits noted, affect appropriate, oriented  x3. ____________________________ MOST RECENT LIPID PANEL 11/18/13  CHOL TOTL 119 mg/dl, LDL 57 NM, HDL 47 mg/dl, TRIGLYCER 74 mg/dl and CHOL/HDL 2.5 (Calc) ____________________________ IMPRESSIONS/PLAN  1. Atrial flutter with prior atrial fibrillation 2. Chronic diastolic heart failure 3. Coronary artery disease with previous redo bypass grafting 4. Long-term anticoagulation with Eliquus  Recommendations:  Plan elective cardioversion. Risks of cardioversion discussed with patient including anesthetic complications death or stroke. He is agreeable to proceed. ____________________________ TODAYS ORDERS  1. Comprehensive Metabolic Panel: Today  2. Complete Blood Count: Today  3. Electrical Cardioversion: First Available  4. 12 Lead EKG: Today                       ____________________________ Cardiology Physician:  Kerry Hough MD [facility]

## 2013-12-30 ENCOUNTER — Encounter (HOSPITAL_COMMUNITY): Payer: Medicare Other | Admitting: Anesthesiology

## 2013-12-30 ENCOUNTER — Encounter (HOSPITAL_COMMUNITY)
Admission: RE | Disposition: A | Discharge status: Home / Self Care | Payer: Self-pay | Source: Ambulatory Visit | Attending: Cardiology

## 2013-12-30 ENCOUNTER — Encounter (HOSPITAL_COMMUNITY): Payer: Self-pay | Admitting: *Deleted

## 2013-12-30 ENCOUNTER — Ambulatory Visit (HOSPITAL_COMMUNITY): Payer: Medicare Other | Admitting: Anesthesiology

## 2013-12-30 ENCOUNTER — Ambulatory Visit: Payer: Medicare Other | Admitting: Physical Therapy

## 2013-12-30 ENCOUNTER — Ambulatory Visit (HOSPITAL_COMMUNITY)
Admission: RE | Admit: 2013-12-30 | Discharge: 2013-12-30 | Disposition: A | Discharge status: Home / Self Care | Payer: Medicare Other | Source: Ambulatory Visit | Attending: Cardiology | Admitting: Cardiology

## 2013-12-30 DIAGNOSIS — I5032 Chronic diastolic (congestive) heart failure: Secondary | ICD-10-CM | POA: Diagnosis not present

## 2013-12-30 DIAGNOSIS — J4489 Other specified chronic obstructive pulmonary disease: Secondary | ICD-10-CM | POA: Insufficient documentation

## 2013-12-30 DIAGNOSIS — I4892 Unspecified atrial flutter: Secondary | ICD-10-CM | POA: Insufficient documentation

## 2013-12-30 DIAGNOSIS — I6529 Occlusion and stenosis of unspecified carotid artery: Secondary | ICD-10-CM | POA: Diagnosis not present

## 2013-12-30 DIAGNOSIS — Z79899 Other long term (current) drug therapy: Secondary | ICD-10-CM | POA: Insufficient documentation

## 2013-12-30 DIAGNOSIS — I2510 Atherosclerotic heart disease of native coronary artery without angina pectoris: Secondary | ICD-10-CM | POA: Insufficient documentation

## 2013-12-30 DIAGNOSIS — Z7901 Long term (current) use of anticoagulants: Secondary | ICD-10-CM | POA: Diagnosis not present

## 2013-12-30 DIAGNOSIS — I739 Peripheral vascular disease, unspecified: Secondary | ICD-10-CM | POA: Insufficient documentation

## 2013-12-30 DIAGNOSIS — I359 Nonrheumatic aortic valve disorder, unspecified: Secondary | ICD-10-CM | POA: Diagnosis not present

## 2013-12-30 DIAGNOSIS — J449 Chronic obstructive pulmonary disease, unspecified: Secondary | ICD-10-CM | POA: Diagnosis not present

## 2013-12-30 DIAGNOSIS — N183 Chronic kidney disease, stage 3 unspecified: Secondary | ICD-10-CM | POA: Insufficient documentation

## 2013-12-30 DIAGNOSIS — Z8249 Family history of ischemic heart disease and other diseases of the circulatory system: Secondary | ICD-10-CM | POA: Diagnosis not present

## 2013-12-30 DIAGNOSIS — I129 Hypertensive chronic kidney disease with stage 1 through stage 4 chronic kidney disease, or unspecified chronic kidney disease: Secondary | ICD-10-CM | POA: Insufficient documentation

## 2013-12-30 DIAGNOSIS — Z951 Presence of aortocoronary bypass graft: Secondary | ICD-10-CM | POA: Diagnosis not present

## 2013-12-30 DIAGNOSIS — I252 Old myocardial infarction: Secondary | ICD-10-CM | POA: Diagnosis not present

## 2013-12-30 DIAGNOSIS — Z7982 Long term (current) use of aspirin: Secondary | ICD-10-CM | POA: Diagnosis not present

## 2013-12-30 DIAGNOSIS — I4891 Unspecified atrial fibrillation: Secondary | ICD-10-CM | POA: Diagnosis present

## 2013-12-30 DIAGNOSIS — I447 Left bundle-branch block, unspecified: Secondary | ICD-10-CM | POA: Insufficient documentation

## 2013-12-30 DIAGNOSIS — Z87891 Personal history of nicotine dependence: Secondary | ICD-10-CM | POA: Diagnosis not present

## 2013-12-30 DIAGNOSIS — E785 Hyperlipidemia, unspecified: Secondary | ICD-10-CM | POA: Insufficient documentation

## 2013-12-30 DIAGNOSIS — M109 Gout, unspecified: Secondary | ICD-10-CM | POA: Diagnosis not present

## 2013-12-30 DIAGNOSIS — Z950 Presence of cardiac pacemaker: Secondary | ICD-10-CM | POA: Insufficient documentation

## 2013-12-30 HISTORY — PX: CARDIOVERSION: SHX1299

## 2013-12-30 LAB — BASIC METABOLIC PANEL
ANION GAP: 15 (ref 5–15)
ANION GAP: 13 (ref 5–15)
BUN: 70 mg/dL — ABNORMAL HIGH (ref 6–23)
BUN: 71 mg/dL — ABNORMAL HIGH (ref 6–23)
CALCIUM: 9.1 mg/dL (ref 8.4–10.5)
CALCIUM: 9.1 mg/dL (ref 8.4–10.5)
CO2: 19 mEq/L (ref 19–32)
CO2: 22 mEq/L (ref 19–32)
Chloride: 104 mEq/L (ref 96–112)
Chloride: 105 mEq/L (ref 96–112)
Creatinine, Ser: 4.09 mg/dL — ABNORMAL HIGH (ref 0.50–1.35)
Creatinine, Ser: 4.06 mg/dL — ABNORMAL HIGH (ref 0.50–1.35)
GFR calc Af Amer: 14 mL/min — ABNORMAL LOW (ref >90)
GFR calc Af Amer: 14 mL/min — ABNORMAL LOW (ref >90)
GFR, EST NON AFRICAN AMERICAN: 12 mL/min — AB (ref >90)
GFR, EST NON AFRICAN AMERICAN: 12 mL/min — AB (ref >90)
GLUCOSE: 86 mg/dL (ref 70–99)
Glucose, Bld: 94 mg/dL (ref 70–99)
Potassium: 5.5 mEq/L — ABNORMAL HIGH (ref 3.7–5.3)
Potassium: 5.3 mEq/L (ref 3.7–5.3)
SODIUM: 138 meq/L (ref 137–147)
Sodium: 140 mEq/L (ref 137–147)

## 2013-12-30 SURGERY — CARDIOVERSION
Anesthesia: General

## 2013-12-30 MED ORDER — PROPOFOL 10 MG/ML IV BOLUS
INTRAVENOUS | Status: DC | PRN
  Administered 2013-12-30: 50 mg via INTRAVENOUS

## 2013-12-30 MED ORDER — LIDOCAINE HCL (CARDIAC) 20 MG/ML IV SOLN
INTRAVENOUS | Status: DC | PRN
  Administered 2013-12-30: 20 mg via INTRAVENOUS

## 2013-12-30 MED ORDER — SODIUM CHLORIDE 0.9 % IV SOLN
INTRAVENOUS | Status: DC
  Administered 2013-12-30: 500 mL via INTRAVENOUS

## 2013-12-30 NOTE — Anesthesia Preprocedure Evaluation (Addendum)
Anesthesia Evaluation  Patient identified by MRN, date of birth, ID band Patient awake    Reviewed: Allergy & Precautions, H&P , NPO status , Patient's Chart, lab work & pertinent test results  History of Anesthesia Complications Negative for: history of anesthetic complications  Airway Mallampati: II TM Distance: >3 FB Neck ROM: Limited    Dental  (+) Teeth Intact, Dental Advisory Given, Edentulous Upper   Pulmonary former smoker,  breath sounds clear to auscultation        Cardiovascular + CAD, + Past MI and + CABG + dysrhythmias Atrial Fibrillation Rhythm:Irregular Rate:Normal     Neuro/Psych    GI/Hepatic negative GI ROS, Neg liver ROS,   Endo/Other  negative endocrine ROS  Renal/GU Renal InsufficiencyRenal disease     Musculoskeletal   Abdominal   Peds  Hematology   Anesthesia Other Findings   Reproductive/Obstetrics negative OB ROS                          Anesthesia Physical Anesthesia Plan  ASA: III  Anesthesia Plan: General   Post-op Pain Management:    Induction: Intravenous  Airway Management Planned: Mask  Additional Equipment:   Intra-op Plan:   Post-operative Plan:   Informed Consent: I have reviewed the patients History and Physical, chart, labs and discussed the procedure including the risks, benefits and alternatives for the proposed anesthesia with the patient or authorized representative who has indicated his/her understanding and acceptance.   Dental advisory given  Plan Discussed with: CRNA and Anesthesiologist  Anesthesia Plan Comments:         Anesthesia Quick Evaluation

## 2013-12-30 NOTE — H&P (View-Only) (Signed)
Edward [name], Edward [name]  Date of visit:  12/27/2013 DOB:  [birth year]    Age:  80 yrs. Medical record number:  [id number]     Account number:  [id number] Primary Care Provider: [facility] ____________________________ CURRENT DIAGNOSES  1. Pre-Op Cardiovascular Exam  2. Arrhythmia-Atrial Fibrillation  3. Peripheral Vascular Disease  4. Chronic Kidney Disease (Stage 3)  5. CAD,Native  6.  Pacemaker/cardiac insitu  7. Aortic Valve Disorder  8. Left Bundle Branch Block Nos  9. Hypertension,Essential (Benign)  10. COPD  61. Hyperlipidemia  12. MI-S/P Inferior  13. Gout  14. Carotid artery stenosis  15. Dyspnea  16. Surgery-Aortocoronary Bypass Grafting ____________________________ ALLERGIES  Ace Inhibitors, Dry cough  Clarithromycin, Intolerance-unknown  Codeine, Intolerance-unknown ____________________________ MEDICATIONS  1. Spiriva with HandiHaler 18 mcg capsule, w/inhalation device, 1 p.o. daily  2. temazepam 15 mg Capsule, QHS  3. Colcrys 0.6 mg tablet, PRN  4. furosemide 20 mg tablet, PRN swelling  5. nitroglycerin 0.4 mg tablet, sublingual, PRN  6. Dulera 200-5 mcg/actuation HFA aerosol inhaler, 2 puff bid  7. aspirin 81 mg tablet,chewable, 1 p.o. daily  8. Rapaflo 8 mg capsule, 1 p.o. daily  9. amiodarone 200 mg tablet, BID  10. Crestor 20 mg tablet, 1 p.o. daily  11. Eliquis 2.5 mg tablet, BID  12. lisinopril 40 mg tablet, 1 p.o. daily ____________________________ CHIEF COMPLAINTS  Ankle edema  Followup of Pre-Op Cardiovascular Exam ____________________________ HISTORY OF PRESENT ILLNESS  Patient returns for cardiac followup. He remains in atrial flutter. He was seen by Dr. Jens Som recently also noted that he was in atrial flutter. He has been taking Eliquus twice a day for the past month without missing doses. He has been tolerating amiodarone fairly well. He is still fatigued and does have some leg edema that may be due to the atrial arrhythmia. He has mild dyspnea. He  is using furosemide as needed for swelling. He denies PND orthopnea. He is not using nitroglycerin. ____________________________ PAST HISTORY  Past Medical Illnesses:  hypertension, hyperlipidemia, peripheral vascular disease, COPD, history of shingles, gout, GERD;  Cardiovascular Illnesses:  diastolic CHF, CAD, S/P MI-inferior, conduction disorder-LBBB, second degree heart block, atrial fibrillation;  Surgical Procedures:  redo CABG w SVG to OM, SVG to RCA8/10/00 Dr. Cyndia Bent, CABG w LIMA to LAD, SVG to dx, OM, RCA 1989 Dr. Arlyce Dice, carotid endarterectomy-bil, cholecystectomy, hemorrhoidectomy, [name] carotid subclavian bypass, repair of anal fissure, AAA repair, R Shoulder surgery x2;  NYHA Classification:  II;  Cardiology Procedures-Invasive:  cardiac cath (left) 2000;  Cardiology Procedures-Noninvasive:  adenosine cardiolte March 2008, treadmill cardiolite June 2011, echocardiogram June 2014, echocardiogram July 2015;  Cardiac Cath Results:  normal Left main, occluded LAD, occluded CFX, occluded RCA, occluded Diag 1 SVG, occluded RCA SVG, 99% stenosis CFX SVG, widely patent LAD LIMA graft;  LVEF of 50% documented via echocardiogram on 12/04/2013,   ____________________________ CARDIO-PULMONARY TEST DATES EKG Date:  11/18/2013;   Cardiac Cath Date:  05/10/2007;  CABG: 01/07/1999;  Nuclear Study Date:  11/26/2009;  Echocardiography Date: 12/04/2013;  Chest Xray Date: 11/18/2013;   ____________________________ FAMILY HISTORY Brother -- Brother dead, Diabetes mellitus Brother -- Brother alive and well Father -- Father dead, Leukemia Mother -- Mother dead, Coronary Artery Disease Sister -- Sister alive and well Sister -- Sister dead Sister -- Sister alive with problem, Cancer Sister -- Sister alive and well Sister -- Sister dead, Cancer Sister -- Coronary Artery Disease ____________________________ SOCIAL HISTORY Alcohol Use:  beer;  Smoking:  used to smoke but quit 1998,  greater than 50 pack year  history;  Diet:  regular diet without modifications;  Lifestyle:  widower;  Exercise:  some exercise;  Occupation:  retired Astronomer;  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 Eyes: wears eye glasses/contact lenses, cataracts Ears, Nose, Throat, Mouth:  denies any hearing loss, epistaxis, hoarseness or difficulty speaking. Respiratory: dyspnea with exertion Cardiovascular:  please review HPI Abdominal: denies dyspepsia, GI bleeding, constipation, or diarrhea Genitourinary-Male: frequency, hesitancy, erectile dysfunction  Musculoskeletal:  arthritis of the right shoulder Neurological:  denies headaches, stroke, or TIA  ____________________________ PHYSICAL EXAMINATION VITAL SIGNS  Blood Pressure:  82/50 Sitting, Right arm, regular cuff  , 86/52 Standing, Right arm and regular cuff   Pulse:  88/min. Weight:  191.00 lbs. Height:  70"BMI: 27  Constitutional:  pleasant white male in no acute distress Skin:  warm and dry to touch, no apparent skin lesions, or masses noted. Head:  normocephalic, normal hair pattern, no masses or tenderness ENT:  ears, nose and throat reveal no gross abnormalities.  Dentition good. Neck:  bilateral carotid endarterectomy scars, no JVD, no bruits, no masses, non-tender Chest:  clear to auscultation, healed median sternotomy scar, healed pacemaker incision in the left pectoral area Cardiac:  irregular rhythm, normal S1 and S2, no S3 or S4, grade 1/6 systolic murmur at aortic area radiating to neck Peripheral Pulses:  bilateral femoral bruits present, femoral pulses 2+, dorsalis pedis pulses diminished, posterior tibial pulses diminished Extremities & Back:  well healed saphenous vein donor site RLE, well healed saphenous vein donor site LLE, no edema present Neurological:  no gross motor or sensory deficits noted, affect appropriate, oriented  x3. ____________________________ MOST RECENT LIPID PANEL 11/18/13  CHOL TOTL 119 mg/dl, LDL 57 NM, HDL 47 mg/dl, TRIGLYCER 74 mg/dl and CHOL/HDL 2.5 (Calc) ____________________________ IMPRESSIONS/PLAN  1. Atrial flutter with prior atrial fibrillation 2. Chronic diastolic heart failure 3. Coronary artery disease with previous redo bypass grafting 4. Long-term anticoagulation with Eliquus  Recommendations:  Plan elective cardioversion. Risks of cardioversion discussed with patient including anesthetic complications death or stroke. He is agreeable to proceed. ____________________________ TODAYS ORDERS  1. Comprehensive Metabolic Panel: Today  2. Complete Blood Count: Today  3. Electrical Cardioversion: First Available  4. 12 Lead EKG: Today                       ____________________________ Cardiology Physician:  Kerry Hough MD [facility]

## 2013-12-30 NOTE — Interval H&P Note (Signed)
History and Physical Interval Note:  12/30/2013 1:17 PM  Edward [name]  has presented today for surgery, with the diagnosis of afib  The various methods of treatment have been discussed with the patient and family. After consideration of risks, benefits and other options for treatment, the patient has consented to  Procedure(s): CARDIOVERSION (N/A) as a surgical intervention .  The patient's history has been reviewed, patient examined, no change in status, stable for surgery.  I have reviewed the patient's chart and labs.  Questions were answered to the patient's satisfaction.     TILLEY JR,W SPENCER

## 2013-12-30 NOTE — CV Procedure (Signed)
Electrical Cardioversion Procedure Note  Edward [name]   80 y.o. male MRN: [id number] DOB: [birth year]  Today's date: 12/30/2013  Procedure: Electrical Cardioversion  Indications:  Atrial Flutter  Time Out: Verified patient identification, verified procedure,medications/allergies/relevent history reviewed, required imaging and test results available.  Performed  Procedure Details  The patient was NPO after midnight. Anesthesia was administered at the beside  by Dr.Joslin with 50 mg of propofol and 20 mg lidocaine..  Cardioversion was done with synchronized biphasic defibrillation with AP pads with  75 watts.  The patient converted to normal sinus rhythm. The patient tolerated the procedure well   IMPRESSION:  Successful cardioversion of atrial flutter   W. Tollie Eth, Brooke Bonito. MD [facility]   12/30/2013, 1:44 PM

## 2013-12-30 NOTE — Transfer of Care (Signed)
Immediate Anesthesia Transfer of Care Note  Patient: Edward [name]  Procedure(s) Performed: Procedure(s): CARDIOVERSION (N/A)  Patient Location: Endoscopy Unit  Anesthesia Type:General  Level of Consciousness: awake, oriented and patient cooperative  Airway & Oxygen Therapy: Patient Spontanous Breathing and Patient connected to nasal cannula oxygen  Post-op Assessment: Report given to PACU RN and Post -op Vital signs reviewed and stable  Post vital signs: Reviewed  Complications: No apparent anesthesia complications

## 2013-12-30 NOTE — Anesthesia Postprocedure Evaluation (Signed)
  Anesthesia Post-op Note  Patient: Edward [name]  Procedure(s) Performed: Procedure(s): CARDIOVERSION (N/A)  Patient Location: Endoscopy Unit  Anesthesia Type:General  Level of Consciousness: awake, alert  and oriented  Airway and Oxygen Therapy: Patient Spontanous Breathing  Post-op Pain: none  Post-op Assessment: Post-op Vital signs reviewed, Patient's Cardiovascular Status Stable, Respiratory Function Stable, Patent Airway, No signs of Nausea or vomiting and Pain level controlled  Post-op Vital Signs: stable  Last Vitals:  Filed Vitals:   12/30/13 1450  BP: 110/61  Pulse: 70  Temp:   Resp: 21    Complications: No apparent anesthesia complications

## 2013-12-31 ENCOUNTER — Encounter (HOSPITAL_COMMUNITY): Payer: Self-pay | Admitting: Cardiology

## 2014-01-24 ENCOUNTER — Other Ambulatory Visit: Payer: Self-pay

## 2014-01-24 ENCOUNTER — Telehealth: Payer: Self-pay | Admitting: Internal Medicine

## 2014-01-24 MED ORDER — TAMSULOSIN HCL 0.4 MG PO CAPS: 0.4000 mg | ORAL_CAPSULE | Freq: Every day | ORAL | Status: DC

## 2014-01-24 NOTE — Telephone Encounter (Signed)
Pt came by to request Rx refill for TAMSULOSIN CAP 0.4MG  to be sent to [facility] (faxed) so that pt can receive it in the mail within a few days before he is completely out. Pt states he previously discussed getting this refill from Dr Asa Lente.

## 2014-02-06 ENCOUNTER — Other Ambulatory Visit: Payer: Self-pay | Admitting: Nephrology

## 2014-02-06 DIAGNOSIS — M549 Dorsalgia, unspecified: Secondary | ICD-10-CM

## 2014-02-06 DIAGNOSIS — G8929 Other chronic pain: Secondary | ICD-10-CM

## 2014-02-06 DIAGNOSIS — R319 Hematuria, unspecified: Secondary | ICD-10-CM

## 2014-02-07 ENCOUNTER — Ambulatory Visit
Admission: RE | Admit: 2014-02-07 | Discharge: 2014-02-07 | Disposition: A | Discharge status: Home / Self Care | Payer: Medicare Other | Source: Ambulatory Visit | Attending: Nephrology | Admitting: Nephrology

## 2014-02-07 DIAGNOSIS — M549 Dorsalgia, unspecified: Secondary | ICD-10-CM

## 2014-02-07 DIAGNOSIS — R319 Hematuria, unspecified: Secondary | ICD-10-CM

## 2014-02-07 DIAGNOSIS — G8929 Other chronic pain: Secondary | ICD-10-CM

## 2014-02-26 ENCOUNTER — Telehealth: Payer: Self-pay | Admitting: Internal Medicine

## 2014-02-26 ENCOUNTER — Ambulatory Visit (INDEPENDENT_AMBULATORY_CARE_PROVIDER_SITE_OTHER): Payer: Medicare Other

## 2014-02-26 DIAGNOSIS — Z23 Encounter for immunization: Secondary | ICD-10-CM

## 2014-02-26 NOTE — Telephone Encounter (Signed)
Rec'd from [place] forward 4 pages to Dr. Asa Lente

## 2014-03-12 ENCOUNTER — Telehealth: Payer: Self-pay | Admitting: Internal Medicine

## 2014-03-12 MED ORDER — TIOTROPIUM BROMIDE MONOHYDRATE 18 MCG IN CAPS: 18.0000 ug | ORAL_CAPSULE | Freq: Every day | RESPIRATORY_TRACT | Status: DC

## 2014-03-12 MED ORDER — MOMETASONE FURO-FORMOTEROL FUM 200-5 MCG/ACT IN AERO: 2.0000 | INHALATION_SPRAY | Freq: Two times a day (BID) | RESPIRATORY_TRACT | Status: DC

## 2014-03-12 NOTE — Telephone Encounter (Signed)
Called and lmomtcb for the pt.  Refills have been sent to the mail order pharmacy.  Will need to let the pt know.

## 2014-03-13 NOTE — Telephone Encounter (Signed)
PT IN LOBBY.  Pt would like to speak w/ nurse regarding this message.  States he had the wrong # written down when returning the call yesterday.  I advised Edward [name] the medications were sent to his mail order pharmacy, but pt would like to still speak w/ a nurse.  [name]

## 2014-03-13 NOTE — Telephone Encounter (Signed)
Spoke with pt and advised that we had called him to let him know the rx's were sent to [place].  Nothing further needed.

## 2014-04-05 ENCOUNTER — Emergency Department (HOSPITAL_COMMUNITY)
Admission: EM | Admit: 2014-04-05 | Discharge: 2014-04-05 | Disposition: A | Discharge status: Home / Self Care | Payer: Medicare Other | Source: Home / Self Care | Attending: Emergency Medicine | Admitting: Emergency Medicine

## 2014-04-05 ENCOUNTER — Encounter (HOSPITAL_COMMUNITY): Payer: Self-pay | Admitting: Oncology

## 2014-04-05 ENCOUNTER — Encounter (HOSPITAL_COMMUNITY): Payer: Self-pay | Admitting: *Deleted

## 2014-04-05 ENCOUNTER — Emergency Department (HOSPITAL_COMMUNITY)
Admission: EM | Admit: 2014-04-05 | Discharge: 2014-04-05 | Disposition: A | Discharge status: Home / Self Care | Payer: Medicare Other | Attending: Emergency Medicine | Admitting: Emergency Medicine

## 2014-04-05 DIAGNOSIS — Z792 Long term (current) use of antibiotics: Secondary | ICD-10-CM | POA: Diagnosis not present

## 2014-04-05 DIAGNOSIS — J449 Chronic obstructive pulmonary disease, unspecified: Secondary | ICD-10-CM | POA: Insufficient documentation

## 2014-04-05 DIAGNOSIS — I129 Hypertensive chronic kidney disease with stage 1 through stage 4 chronic kidney disease, or unspecified chronic kidney disease: Secondary | ICD-10-CM | POA: Insufficient documentation

## 2014-04-05 DIAGNOSIS — N400 Enlarged prostate without lower urinary tract symptoms: Secondary | ICD-10-CM | POA: Diagnosis not present

## 2014-04-05 DIAGNOSIS — R339 Retention of urine, unspecified: Secondary | ICD-10-CM | POA: Insufficient documentation

## 2014-04-05 DIAGNOSIS — Z8719 Personal history of other diseases of the digestive system: Secondary | ICD-10-CM | POA: Insufficient documentation

## 2014-04-05 DIAGNOSIS — Q602 Renal agenesis, unspecified: Secondary | ICD-10-CM | POA: Insufficient documentation

## 2014-04-05 DIAGNOSIS — Z79899 Other long term (current) drug therapy: Secondary | ICD-10-CM | POA: Insufficient documentation

## 2014-04-05 DIAGNOSIS — Y846 Urinary catheterization as the cause of abnormal reaction of the patient, or of later complication, without mention of misadventure at the time of the procedure: Secondary | ICD-10-CM | POA: Insufficient documentation

## 2014-04-05 DIAGNOSIS — I2510 Atherosclerotic heart disease of native coronary artery without angina pectoris: Secondary | ICD-10-CM | POA: Insufficient documentation

## 2014-04-05 DIAGNOSIS — Z951 Presence of aortocoronary bypass graft: Secondary | ICD-10-CM | POA: Diagnosis not present

## 2014-04-05 DIAGNOSIS — Z7982 Long term (current) use of aspirin: Secondary | ICD-10-CM | POA: Insufficient documentation

## 2014-04-05 DIAGNOSIS — Z950 Presence of cardiac pacemaker: Secondary | ICD-10-CM | POA: Diagnosis not present

## 2014-04-05 DIAGNOSIS — Z8659 Personal history of other mental and behavioral disorders: Secondary | ICD-10-CM

## 2014-04-05 DIAGNOSIS — Z9861 Coronary angioplasty status: Secondary | ICD-10-CM

## 2014-04-05 DIAGNOSIS — Z87891 Personal history of nicotine dependence: Secondary | ICD-10-CM | POA: Diagnosis not present

## 2014-04-05 DIAGNOSIS — I5032 Chronic diastolic (congestive) heart failure: Secondary | ICD-10-CM

## 2014-04-05 DIAGNOSIS — Z8639 Personal history of other endocrine, nutritional and metabolic disease: Secondary | ICD-10-CM | POA: Diagnosis not present

## 2014-04-05 DIAGNOSIS — I252 Old myocardial infarction: Secondary | ICD-10-CM | POA: Insufficient documentation

## 2014-04-05 DIAGNOSIS — I130 Hypertensive heart and chronic kidney disease with heart failure and stage 1 through stage 4 chronic kidney disease, or unspecified chronic kidney disease: Secondary | ICD-10-CM | POA: Diagnosis not present

## 2014-04-05 DIAGNOSIS — N289 Disorder of kidney and ureter, unspecified: Secondary | ICD-10-CM

## 2014-04-05 DIAGNOSIS — E785 Hyperlipidemia, unspecified: Secondary | ICD-10-CM

## 2014-04-05 DIAGNOSIS — Z7951 Long term (current) use of inhaled steroids: Secondary | ICD-10-CM | POA: Insufficient documentation

## 2014-04-05 DIAGNOSIS — T83098A Other mechanical complication of other indwelling urethral catheter, initial encounter: Secondary | ICD-10-CM | POA: Insufficient documentation

## 2014-04-05 DIAGNOSIS — Z7902 Long term (current) use of antithrombotics/antiplatelets: Secondary | ICD-10-CM | POA: Insufficient documentation

## 2014-04-05 DIAGNOSIS — N183 Chronic kidney disease, stage 3 (moderate): Secondary | ICD-10-CM | POA: Diagnosis not present

## 2014-04-05 DIAGNOSIS — M109 Gout, unspecified: Secondary | ICD-10-CM | POA: Insufficient documentation

## 2014-04-05 DIAGNOSIS — T83091A Other mechanical complication of indwelling urethral catheter, initial encounter: Secondary | ICD-10-CM

## 2014-04-05 DIAGNOSIS — Z9889 Other specified postprocedural states: Secondary | ICD-10-CM | POA: Diagnosis not present

## 2014-04-05 DIAGNOSIS — R319 Hematuria, unspecified: Secondary | ICD-10-CM | POA: Insufficient documentation

## 2014-04-05 LAB — I-STAT CHEM 8, ED
BUN: 26 mg/dL — ABNORMAL HIGH (ref 6–23)
CALCIUM ION: 1.22 mmol/L (ref 1.13–1.30)
Chloride: 101 mEq/L (ref 96–112)
Creatinine, Ser: 2.2 mg/dL — ABNORMAL HIGH (ref 0.50–1.35)
GLUCOSE: 93 mg/dL (ref 70–99)
HEMATOCRIT: 31 % — AB (ref 39.0–52.0)
Hemoglobin: 10.5 g/dL — ABNORMAL LOW (ref 13.0–17.0)
Potassium: 4.1 mEq/L (ref 3.7–5.3)
Sodium: 136 mEq/L — ABNORMAL LOW (ref 137–147)
TCO2: 23 mmol/L (ref 0–100)

## 2014-04-05 LAB — URINALYSIS, ROUTINE W REFLEX MICROSCOPIC
Bilirubin Urine: NEGATIVE
GLUCOSE, UA: NEGATIVE mg/dL
KETONES UR: NEGATIVE mg/dL
Nitrite: NEGATIVE
PROTEIN: 100 mg/dL — AB
Specific Gravity, Urine: 1.008 (ref 1.005–1.030)
Urobilinogen, UA: 0.2 mg/dL (ref 0.0–1.0)
pH: 6 (ref 5.0–8.0)

## 2014-04-05 LAB — URINE MICROSCOPIC-ADD ON

## 2014-04-05 LAB — CBC WITH DIFFERENTIAL/PLATELET
BASOS ABS: 0 10*3/uL (ref 0.0–0.1)
BASOS PCT: 0 % (ref 0–1)
EOS PCT: 1 % (ref 0–5)
Eosinophils Absolute: 0.1 10*3/uL (ref 0.0–0.7)
HCT: 28.5 % — ABNORMAL LOW (ref 39.0–52.0)
Hemoglobin: 9.6 g/dL — ABNORMAL LOW (ref 13.0–17.0)
Lymphocytes Relative: 12 % (ref 12–46)
Lymphs Abs: 1 10*3/uL (ref 0.7–4.0)
MCH: 31.4 pg (ref 26.0–34.0)
MCHC: 33.7 g/dL (ref 30.0–36.0)
MCV: 93.1 fL (ref 78.0–100.0)
MONO ABS: 1.3 10*3/uL — AB (ref 0.1–1.0)
Monocytes Relative: 16 % — ABNORMAL HIGH (ref 3–12)
Neutro Abs: 5.8 10*3/uL (ref 1.7–7.7)
Neutrophils Relative %: 71 % (ref 43–77)
Platelets: 192 10*3/uL (ref 150–400)
RBC: 3.06 MIL/uL — ABNORMAL LOW (ref 4.22–5.81)
RDW: 13.9 % (ref 11.5–15.5)
WBC: 8.1 10*3/uL (ref 4.0–10.5)

## 2014-04-05 MED ORDER — LIDOCAINE HCL 2 % EX GEL
CUTANEOUS | Status: AC
  Administered 2014-04-05: 10
  Filled 2014-04-05: qty 10

## 2014-04-05 MED ORDER — HYDROCODONE-ACETAMINOPHEN 5-325 MG PO TABS: 1.0000 | ORAL_TABLET | Freq: Four times a day (QID) | ORAL | Status: DC | PRN

## 2014-04-05 MED ORDER — FENTANYL CITRATE 0.05 MG/ML IJ SOLN
50.0000 ug | Freq: Once | INTRAMUSCULAR | Status: AC
  Administered 2014-04-05: 50 ug via INTRAVENOUS
  Filled 2014-04-05: qty 2

## 2014-04-05 NOTE — ED Notes (Addendum)
Foley continues to drain well. Blood tinged urine noted. Pt denies discomfort, sts he is much more comfortable.

## 2014-04-05 NOTE — ED Provider Notes (Signed)
CSN: [id number]     Arrival date & time 04/05/14  1118 History   First MD Initiated Contact with Patient 04/05/14 1130     Chief Complaint  Patient presents with  . urinary cathether problems      (Consider location/radiation/quality/duration/timing/severity/associated sxs/prior Treatment) The history is provided by the patient and a relative.  pt with foley catheter placed 2 days ago due to urinary retention/hematuria, c/o very little flow of urine since emptying bag at 5 am today, w progressive suprapubic fullness and pain since. Urine has remained clear/red tinged.  No other abn bruising or bleeding. Has help his eliquis the past couple days. No fever or chills. Is eating and drinking normally. No nv.       Past Medical History  Diagnosis Date  . Gout     "only once in my lifetime" (12/18/2012)  . Depression   . PVD (peripheral vascular disease)     s/p B CEA  . COPD (chronic obstructive pulmonary disease)      PFT 6.19.07: FEV1 465  ratio 60 with 25% response to B2.  > PFTs 8.29.07: FEV1 61%  ratio 46% no better after B2.  > add on advair 12.20.11-improved 1.31.12  . AAA (abdominal aortic aneurysm)   . Melanosis coli   . Internal hemorrhoids   . Carotid artery occlusion   . Aortic valve disorder   . Chronic diastolic heart failure 39/11/6732  . CAD (coronary artery disease), native coronary artery     CABG w LIMA to LAD, SVG to dx, OM, RCA 1989 Dr. Arlyce Dice for 3VD PTCA of OM, 1999 and 2000 Cath showed occlusion of left main and RCA with stenosis in OM Redo redo CABG w SVG to OM, SVG to RCA8/10/00 Dr. Cyndia Bent   . Hyperlipidemia   . BPH (benign prostatic hypertrophy)   . Hypertensive heart disease     Change toprol to bisoprolol 10 mg daily on trial basis  05/21/2012  - notified by [facility] non adherent 07/17/2012    . Myocardial infarction 1979  . Chronic kidney disease stage III (GFR 30-59 ml/min)   . Congenital absence of kidney     "noted during AAA repair; never knew it before"  (12/18/2012)  . LBBB (left bundle branch block)   . Second degree heart block 12/18/2012    s/p MDT Adapta L pacemaker 12-20-2012 by Dr Caryl Comes   Past Surgical History  Procedure Laterality Date  . Coronary artery bypass graft  1989, 2000     "CABG X ?3; CABG X 5 w//Bartle" (12/18/2012)  . Cholecystectomy  1998  . Hemiarthroplasty shoulder fracture Right ~ 2008     x 2 - [facility]  . Carotid endarterectomy Bilateral 1990's  . Abdominal aortic aneurysm repair  1998  . Carotid-subclavian bypass graft Left   . Anal fissure repair      12/18/2012 "I don't remember this"  . Hemorroidectomy      12/18/2012 "I don't remember this"  . Carpal tunnel release Right 1980's    "Dr. Marrian Salvage" (12/18/2012)  . Cardiac catheterization      "2 or 3" (12/18/2012)  . Coronary angioplasty      "a few" (12/18/2012)  . Coronary angioplasty with stent placement      "I've got 1 or 2" (12/18/2012)  . Joint replacement    . Hip fracture surgery  1944    "fell out of a tree; had it operated on 3 times" (12/18/2012)  . Refractive surgery Bilateral 1990's?  Marland Kitchen  Pacemaker insertion  12-20-2012    dual chamber Medtronic Adapta L pacemaker implanted by Dr Caryl Comes  . Cardioversion N/A 12/30/2013    Procedure: CARDIOVERSION;  Surgeon: Jacolyn Reedy, MD;  Location: [facility] ENDOSCOPY;  Service: Cardiovascular;  Laterality: N/A;   Family History  Problem Relation Age of Onset  . Heart disease Mother   . Colon cancer Neg Hx    History  Substance Use Topics  . Smoking status: Former Smoker -- 1.50 packs/day for 50 years    Types: Cigarettes    Quit date: 05/30/1996  . Smokeless tobacco: Never Used  . Alcohol Use: 1.8 oz/week    3 Cans of beer per week     Comment: 12/18/2012 "I'll have 2-3 beers/wk; sometimes I'll go for awhile and not have any"    Review of Systems  Constitutional: Negative for fever and chills.  HENT: Negative for nosebleeds.   Eyes: Negative for redness.  Respiratory: Negative for shortness of  breath.   Cardiovascular: Negative for chest pain.  Gastrointestinal: Negative for vomiting and blood in stool.  Endocrine: Negative for polyuria.  Genitourinary: Positive for hematuria. Negative for flank pain.  Musculoskeletal: Negative for back pain and neck pain.  Skin: Negative for rash.  Neurological: Negative for headaches.  Hematological: Does not bruise/bleed easily.  Psychiatric/Behavioral: Negative for confusion.      Allergies  Ace inhibitors; Clarithromycin; Codeine; and Oxycodone-acetaminophen  Home Medications   Prior to Admission medications   Medication Sig Start Date End Date Taking? Authorizing Provider  amiodarone (PACERONE) 200 MG tablet Take 200 mg by mouth 2 (two) times daily.    Historical Provider, MD  apixaban (ELIQUIS) 2.5 MG TABS tablet Take 2.5 mg by mouth 2 (two) times daily.    Historical Provider, MD  aspirin EC 81 MG tablet Take 81 mg by mouth 2 (two) times daily.    Historical Provider, MD  Aspirin-Salicylamide-Caffeine (BC HEADACHE POWDER PO) Take 1 packet by mouth daily as needed (pain).    Historical Provider, MD  colchicine 0.6 MG tablet Take 0.6 mg by mouth daily as needed (gout flare ups).    Historical Provider, MD  furosemide (LASIX) 20 MG tablet Take 20 mg by mouth daily as needed for fluid or edema (leg swelling).    Historical Provider, MD  mometasone-formoterol (DULERA) 200-5 MCG/ACT AERO Inhale 2 puffs into the lungs every 12 (twelve) hours. 03/12/14   Tanda Rockers, MD  nitroGLYCERIN (NITROSTAT) 0.4 MG SL tablet Place 0.4 mg under the tongue every 5 (five) minutes as needed for chest pain.    Historical Provider, MD  rosuvastatin (CRESTOR) 20 MG tablet Take 20 mg by mouth daily.      Historical Provider, MD  tamsulosin (FLOMAX) 0.4 MG CAPS capsule Take 1 capsule (0.4 mg total) by mouth at bedtime. 01/24/14   Rowe Clack, MD  temazepam (RESTORIL) 15 MG capsule Take 15 mg by mouth at bedtime.    Historical Provider, MD  tiotropium  (SPIRIVA) 18 MCG inhalation capsule Place 1 capsule (18 mcg total) into inhaler and inhale daily. 03/12/14 03/12/15  Tanda Rockers, MD   BP 147/77 mmHg  Pulse 92  Temp(Src) 98.2 F (36.8 C) (Oral)  Resp 16  SpO2 97% Physical Exam  Constitutional: He is oriented to person, place, and time. He appears well-developed and well-nourished. No distress.  HENT:  Mouth/Throat: Oropharynx is clear and moist.  Eyes: Conjunctivae are normal. No scleral icterus.  Neck: Neck supple. No tracheal deviation present.  Cardiovascular: Normal rate.   Pulmonary/Chest: Effort normal. No accessory muscle usage. No respiratory distress.  Abdominal: Soft. Bowel sounds are normal. He exhibits no distension and no mass. There is no rebound and no guarding.  Suprapubic tenderness/fullness  Genitourinary:  No cva tenderness. Normal ext genitalia, foley in place. Few cc blood tinged urine in bag.   Musculoskeletal: Normal range of motion. He exhibits no edema.  Neurological: He is alert and oriented to person, place, and time.  Skin: Skin is warm and dry. No rash noted. He is not diaphoretic.  Psychiatric: He has a normal mood and affect.  Nursing note and vitals reviewed.   ED Course  Procedures (including critical care time) Labs Review    MDM  Bladder scan. Staff to irrigate catheter.  Reviewed nursing notes and prior charts for additional history.   After irrigation, 800+ urine out, urine flowing freely into bag.  abd soft nt.  No pain.  Pt appears stable for dc.     Mirna Mires, MD 04/05/14 1320

## 2014-04-05 NOTE — Discharge Instructions (Signed)
It was our pleasure to provide your ER care today - we hope that you feel better.  Empty leg bag as need. Drink plenty of fluids. Follow up with your urologist this coming week.  Return to ER if worse, new symptoms, fevers, catheter not working/no urine output, severe abdominal pain, other concern.     Hematuria Hematuria is blood in your urine. It can be caused by a bladder infection, kidney infection, prostate infection, kidney stone, or cancer of your urinary tract. Infections can usually be treated with medicine, and a kidney stone usually will pass through your urine. If neither of these is the cause of your hematuria, further workup to find out the reason may be needed. It is very important that you tell your health care provider about any blood you see in your urine, even if the blood stops without treatment or happens without causing pain. Blood in your urine that happens and then stops and then happens again can be a symptom of a very serious condition. Also, pain is not a symptom in the initial stages of many urinary cancers. HOME CARE INSTRUCTIONS   Drink lots of fluid, 3-4 quarts a day. If you have been diagnosed with an infection, cranberry juice is especially recommended, in addition to large amounts of water.  Avoid caffeine, tea, and carbonated beverages because they tend to irritate the bladder.  Avoid alcohol because it may irritate the prostate.  Take all medicines as directed by your health care provider.  If you were prescribed an antibiotic medicine, finish it all even if you start to feel better.  If you have been diagnosed with a kidney stone, follow your health care provider's instructions regarding straining your urine to catch the stone.  Empty your bladder often. Avoid holding urine for long periods of time.  After a bowel movement, women should cleanse front to back. Use each tissue only once.  Empty your bladder before and after sexual intercourse if you  are a male. SEEK MEDICAL CARE IF: 1. You develop back pain. 2. You have a fever. 3. You have a feeling of sickness in your stomach (nausea) or vomiting. 4. Your symptoms are not better in 3 days. Return sooner if you are getting worse. SEEK IMMEDIATE MEDICAL CARE IF:  1. You develop severe vomiting and are unable to keep the medicine down. 2. You develop severe back or abdominal pain despite taking your medicines. 3. You begin passing a large amount of blood or clots in your urine. 4. You feel extremely weak or faint, or you pass out. MAKE SURE YOU:  1. Understand these instructions. 2. Will watch your condition. 3. Will get help right away if you are not doing well or get worse. Document Released: 05/16/2005 Document Revised: 09/30/2013 Document Reviewed: 01/14/2013 [facility] Patient Information 2015 [place], [place]. This information is not intended to replace advice given to you by your health care provider. Make sure you discuss any questions you have with your health care provider.    Acute Urinary Retention Acute urinary retention is the temporary inability to urinate. This is a common problem in older men. As men age their prostates become larger and block the flow of urine from the bladder. This is usually a problem that has come on gradually.  HOME CARE INSTRUCTIONS If you are sent home with a Foley catheter and a drainage system, you will need to discuss the best course of action with your health care provider. While the catheter is in, maintain  a good intake of fluids. Keep the drainage bag emptied and lower than your catheter. This is so that contaminated urine will not flow back into your bladder, which could lead to a urinary tract infection. There are two main types of drainage bags. One is a large bag that usually is used at night. It has a good capacity that will allow you to sleep through the night without having to empty it. The second type is called a leg bag. It has a  smaller capacity, so it needs to be emptied more frequently. However, the main advantage is that it can be attached by a leg strap and can go underneath your clothing, allowing you the freedom to move about or leave your home. Only take over-the-counter or prescription medicines for pain, discomfort, or fever as directed by your health care provider.  SEEK MEDICAL CARE IF:  You develop a low-grade fever.  You experience spasms or leakage of urine with the spasms. SEEK IMMEDIATE MEDICAL CARE IF:  5. You develop chills or fever. 6. Your catheter stops draining urine. 7. Your catheter falls out. 8. You start to develop increased bleeding that does not respond to rest and increased fluid intake. MAKE SURE YOU: 5. Understand these instructions. 6. Will watch your condition. 7. Will get help right away if you are not doing well or get worse. Document Released: 08/22/2000 Document Revised: 05/21/2013 Document Reviewed: 10/25/2012 [facility] Patient Information 2015 [place], [place]. This information is not intended to replace advice given to you by your health care provider. Make sure you discuss any questions you have with your health care provider.    Foley Catheter Care A Foley catheter is a soft, flexible tube that is placed into the bladder to drain urine. A Foley catheter may be inserted if:  You leak urine or are not able to control when you urinate (urinary incontinence).  You are not able to urinate when you need to (urinary retention).  You had prostate surgery or surgery on the genitals.  You have certain medical conditions, such as multiple sclerosis, dementia, or a spinal cord injury. If you are going home with a Foley catheter in place, follow the instructions below. TAKING CARE OF THE CATHETER 9. Wash your hands with soap and water. 10. Using mild soap and warm water on a clean washcloth:  Clean the area on your body closest to the catheter insertion site using a circular  motion, moving away from the catheter. Never wipe toward the catheter because this could sweep bacteria up into the urethra and cause infection.  Remove all traces of soap. Pat the area dry with a clean towel. For males, reposition the foreskin. 11. Attach the catheter to your leg so there is no tension on the catheter. Use adhesive tape or a leg strap. If you are using adhesive tape, remove any sticky residue left behind by the previous tape you used. 12. Keep the drainage bag below the level of the bladder, but keep it off the floor. 13. Check throughout the day to be sure the catheter is working and urine is draining freely. Make sure the tubing does not become kinked. 14. Do not pull on the catheter or try to remove it. Pulling could damage internal tissues. TAKING CARE OF THE DRAINAGE BAGS You will be given two drainage bags to take home. One is a large overnight drainage bag, and the other is a smaller leg bag that fits underneath clothing. You may wear the overnight bag  at any time, but you should never wear the smaller leg bag at night. Follow the instructions below for how to empty, change, and clean your drainage bags. Emptying the Drainage Bag You must empty your drainage bag when it is  - full or at least 2-3 times a day. 8. Wash your hands with soap and water. 9. Keep the drainage bag below your hips, below the level of your bladder. This stops urine from going back into the tubing and into your bladder. 10. Hold the dirty bag over the toilet or a clean container. 11. Open the pour spout at the bottom of the bag and empty the urine into the toilet or container. Do not let the pour spout touch the toilet, container, or any other surface. Doing so can place bacteria on the bag, which can cause an infection. 12. Clean the pour spout with a gauze pad or cotton ball that has rubbing alcohol on it. 13. Close the pour spout. 14. Attach the bag to your leg with adhesive tape or a leg  strap. 15. Wash your hands well. Changing the Drainage Bag Change your drainage bag once a month or sooner if it starts to smell bad or look dirty. Below are steps to follow when changing the drainage bag. 4. Wash your hands with soap and water. 5. Pinch off the rubber catheter so that urine does not spill out. 6. Disconnect the catheter tube from the drainage tube at the connection valve. Do not let the tubes touch any surface. 7. Clean the end of the catheter tube with an alcohol wipe. Use a different alcohol wipe to clean the end of the drainage tube. 8. Connect the catheter tube to the drainage tube of the clean drainage bag. 9. Attach the new bag to the leg with adhesive tape or a leg strap. Avoid attaching the new bag too tightly. 10. Wash your hands well. Cleaning the Drainage Bag 1. Wash your hands with soap and water. 2. Wash the bag in warm, soapy water. 3. Rinse the bag thoroughly with warm water. 4. Fill the bag with a solution of white vinegar and water (1 cup vinegar to 1 qt warm water [.2 L vinegar to 1 L warm water]). Close the bag and soak it for 30 minutes in the solution. 5. Rinse the bag with warm water. 6. Hang the bag to dry with the pour spout open and hanging downward. 7. Store the clean bag (once it is dry) in a clean plastic bag. 8. Wash your hands well. PREVENTING INFECTION  Wash your hands before and after handling your catheter.  Take showers daily and wash the area where the catheter enters your body. Do not take baths. Replace wet leg straps with dry ones, if this applies.  Do not use powders, sprays, or lotions on the genital area. Only use creams, lotions, or ointments as directed by your caregiver.  For females, wipe from front to back after each bowel movement.  Drink enough fluids to keep your urine clear or pale yellow unless you have a fluid restriction.  Do not let the drainage bag or tubing touch or lie on the floor.  Wear cotton underwear to  absorb moisture and to keep your skin drier. SEEK MEDICAL CARE IF:   Your urine is cloudy or smells unusually bad.  Your catheter becomes clogged.  You are not draining urine into the bag or your bladder feels full.  Your catheter starts to leak. [place]  CARE IF:   You have pain, swelling, redness, or pus where the catheter enters the body.  You have pain in the abdomen, legs, lower back, or bladder.  You have a fever.  You see blood fill the catheter, or your urine is pink or red.  You have nausea, vomiting, or chills.  Your catheter gets pulled out. MAKE SURE YOU:   Understand these instructions.  Will watch your condition.  Will get help right away if you are not doing well or get worse. Document Released: 05/16/2005 Document Revised: 09/30/2013 Document Reviewed: 05/07/2012 [facility] Patient Information 2015 [place], [place]. This information is not intended to replace advice given to you by your health care provider. Make sure you discuss any questions you have with your health care provider.

## 2014-04-05 NOTE — ED Notes (Signed)
Pt reports he was having hematuria on Thursday, went to urologist had foley cathether placed. Pt reports now urine output has slowed a lot and constant bladder pain/spasms. Pt son was on phone with urologist this morning, called in prescription for oxybutynin which pt took at 1000 with no relief. Pain 10/10 at present.

## 2014-04-05 NOTE — ED Notes (Signed)
Pt seen here this am for the same.  Once pt returned home catheter became clogged again.

## 2014-04-05 NOTE — ED Notes (Signed)
Additional 268ml blood tinged urine output via foley.

## 2014-04-05 NOTE — ED Provider Notes (Signed)
CSN: [id number]     Arrival date & time 04/05/14  1912 History   First MD Initiated Contact with Patient 04/05/14 1932     Chief Complaint  Patient presents with  . Urinary Retention     (Consider location/radiation/quality/duration/timing/severity/associated sxs/prior Treatment) The history is provided by the patient.  patient was seen in the ER earlier today for urinary retention. He had a Foley catheter placed at [facility] on Thursday. He reportedly had some difficulty putting it in. He is on Eliquis for afib. He began to have decreased urine output this afternoon again. And increased pain. Has a crampy pain  Past Medical History  Diagnosis Date  . Gout     "only once in my lifetime" (12/18/2012)  . Depression   . PVD (peripheral vascular disease)     s/p B CEA  . COPD (chronic obstructive pulmonary disease)      PFT 6.19.07: FEV1 465  ratio 60 with 25% response to B2.  > PFTs 8.29.07: FEV1 61%  ratio 46% no better after B2.  > add on advair 12.20.11-improved 1.31.12  . AAA (abdominal aortic aneurysm)   . Melanosis coli   . Internal hemorrhoids   . Carotid artery occlusion   . Aortic valve disorder   . Chronic diastolic heart failure 42/10/8339  . CAD (coronary artery disease), native coronary artery     CABG w LIMA to LAD, SVG to dx, OM, RCA 1989 Dr. Arlyce Dice for 3VD PTCA of OM, 1999 and 2000 Cath showed occlusion of left main and RCA with stenosis in OM Redo redo CABG w SVG to OM, SVG to RCA8/10/00 Dr. Cyndia Bent   . Hyperlipidemia   . BPH (benign prostatic hypertrophy)   . Hypertensive heart disease     Change toprol to bisoprolol 10 mg daily on trial basis  05/21/2012  - notified by [facility] non adherent 07/17/2012    . Myocardial infarction 1979  . Chronic kidney disease stage III (GFR 30-59 ml/min)   . Congenital absence of kidney     "noted during AAA repair; never knew it before" (12/18/2012)  . LBBB (left bundle branch block)   . Second degree heart block 12/18/2012    s/p  MDT Adapta L pacemaker 12-20-2012 by Dr Caryl Comes   Past Surgical History  Procedure Laterality Date  . Coronary artery bypass graft  1989, 2000     "CABG X ?3; CABG X 5 w//Bartle" (12/18/2012)  . Cholecystectomy  1998  . Hemiarthroplasty shoulder fracture Right ~ 2008     x 2 - [facility]  . Carotid endarterectomy Bilateral 1990's  . Abdominal aortic aneurysm repair  1998  . Carotid-subclavian bypass graft Left   . Anal fissure repair      12/18/2012 "I don't remember this"  . Hemorroidectomy      12/18/2012 "I don't remember this"  . Carpal tunnel release Right 1980's    "Dr. Marrian Salvage" (12/18/2012)  . Cardiac catheterization      "2 or 3" (12/18/2012)  . Coronary angioplasty      "a few" (12/18/2012)  . Coronary angioplasty with stent placement      "I've got 1 or 2" (12/18/2012)  . Joint replacement    . Hip fracture surgery  1944    "fell out of a tree; had it operated on 3 times" (12/18/2012)  . Refractive surgery Bilateral 1990's?  . Pacemaker insertion  12-20-2012    dual chamber Medtronic Adapta L pacemaker implanted by Dr Caryl Comes  .  Cardioversion N/A 12/30/2013    Procedure: CARDIOVERSION;  Surgeon: Jacolyn Reedy, MD;  Location: [facility] ENDOSCOPY;  Service: Cardiovascular;  Laterality: N/A;   Family History  Problem Relation Age of Onset  . Heart disease Mother   . Colon cancer Neg Hx    History  Substance Use Topics  . Smoking status: Former Smoker -- 1.50 packs/day for 50 years    Types: Cigarettes    Quit date: 05/30/1996  . Smokeless tobacco: Never Used  . Alcohol Use: 1.8 oz/week    3 Cans of beer per week     Comment: 12/18/2012 "I'll have 2-3 beers/wk; sometimes I'll go for awhile and not have any"    Review of Systems  Constitutional: Negative for activity change.  Eyes: Negative for pain.  Respiratory: Negative for shortness of breath.   Gastrointestinal: Positive for abdominal pain. Negative for nausea.  Genitourinary: Positive for hematuria and difficulty  urinating. Negative for frequency and scrotal swelling.  Skin: Negative for wound.      Allergies  Ace inhibitors; Clarithromycin; Codeine; and Oxycodone-acetaminophen  Home Medications   Prior to Admission medications   Medication Sig Start Date End Date Taking? Authorizing Provider  amiodarone (PACERONE) 200 MG tablet Take 200 mg by mouth 2 (two) times daily.   Yes Historical Provider, MD  dutasteride (AVODART) 0.5 MG capsule Take 0.5 mg by mouth daily.   Yes Historical Provider, MD  mometasone-formoterol (DULERA) 200-5 MCG/ACT AERO Inhale 2 puffs into the lungs every 12 (twelve) hours. 03/12/14  Yes Tanda Rockers, MD  oxybutynin (DITROPAN) 5 MG tablet Take 5 mg by mouth every 8 (eight) hours as needed for bladder spasms (bladder spasms).    Yes Historical Provider, MD  rosuvastatin (CRESTOR) 20 MG tablet Take 20 mg by mouth daily.     Yes Historical Provider, MD  sulfamethoxazole-trimethoprim (BACTRIM,SEPTRA) 400-80 MG per tablet Take 1 tablet by mouth 2 (two) times daily.   Yes Historical Provider, MD  tamsulosin (FLOMAX) 0.4 MG CAPS capsule Take 1 capsule (0.4 mg total) by mouth at bedtime. 01/24/14  Yes Rowe Clack, MD  temazepam (RESTORIL) 15 MG capsule Take 15 mg by mouth at bedtime.   Yes Historical Provider, MD  tiotropium (SPIRIVA) 18 MCG inhalation capsule Place 1 capsule (18 mcg total) into inhaler and inhale daily. 03/12/14 03/12/15 Yes Tanda Rockers, MD  apixaban (ELIQUIS) 2.5 MG TABS tablet Take 2.5 mg by mouth 2 (two) times daily.    Historical Provider, MD  aspirin EC 81 MG tablet Take 81 mg by mouth 2 (two) times daily.    Historical Provider, MD  Aspirin-Salicylamide-Caffeine (BC HEADACHE POWDER PO) Take 1 packet by mouth daily as needed (pain).    Historical Provider, MD  colchicine 0.6 MG tablet Take 0.6 mg by mouth daily as needed (gout flare ups).    Historical Provider, MD  furosemide (LASIX) 20 MG tablet Take 20 mg by mouth daily as needed for fluid or  edema (leg swelling).    Historical Provider, MD  HYDROcodone-acetaminophen (NORCO) 5-325 MG per tablet Take 1-2 tablets by mouth every 6 (six) hours as needed for moderate pain. 04/05/14   Jasper Riling. Ariea Rochin, MD  nitroGLYCERIN (NITROSTAT) 0.4 MG SL tablet Place 0.4 mg under the tongue every 5 (five) minutes as needed for chest pain.    Historical Provider, MD   BP 123/62 mmHg  Pulse 69  Temp(Src) 98.6 F (37 C) (Oral)  Resp 20  Ht 5\' 10"  (1.778 m)  Wt  185 lb (83.915 kg)  BMI 26.54 kg/m2  SpO2 100% Physical Exam  Constitutional: He is oriented to person, place, and time. He appears well-developed and well-nourished.  HENT:  Head: Normocephalic and atraumatic.  Cardiovascular: Normal rate, regular rhythm and normal heart sounds.   No murmur heard. Pulmonary/Chest: Effort normal and breath sounds normal.  Abdominal: Soft. He exhibits mass. He exhibits no distension. There is tenderness. There is no rebound and no guarding.  Lower abdominal mass and tenderness.  Genitourinary:  Foley catheter in place  Musculoskeletal: Normal range of motion. He exhibits no edema.  Neurological: He is alert and oriented to person, place, and time. No cranial nerve deficit.  Skin: Skin is warm and dry.  Psychiatric: He has a normal mood and affect.  Nursing note and vitals reviewed.   ED Course  Procedures (including critical care time) Labs Review Labs Reviewed  CBC WITH DIFFERENTIAL - Abnormal; Notable for the following:    RBC 3.06 (*)    Hemoglobin 9.6 (*)    HCT 28.5 (*)    Monocytes Relative 16 (*)    Monocytes Absolute 1.3 (*)    All other components within normal limits  URINALYSIS, ROUTINE W REFLEX MICROSCOPIC - Abnormal; Notable for the following:    Color, Urine RED (*)    APPearance TURBID (*)    Hgb urine dipstick LARGE (*)    Protein, ur 100 (*)    Leukocytes, UA MODERATE (*)    All other components within normal limits  URINE MICROSCOPIC-ADD ON - Abnormal; Notable for the  following:    Bacteria, UA FEW (*)    All other components within normal limits  I-STAT CHEM 8, ED - Abnormal; Notable for the following:    Sodium 136 (*)    BUN 26 (*)    Creatinine, Ser 2.20 (*)    Hemoglobin 10.5 (*)    HCT 31.0 (*)    All other components within normal limits  URINE CULTURE    Imaging Review No results found.   EKG Interpretation None      MDM   Final diagnoses:  Obstructed Foley catheter, initial encounter  Renal insufficiency   Patient with urinary retention and hematuria. Has Foley catheter in place. Had plugged up earlier todayand was flushed. Replaced in the ER. Patient feels somewhat better. Hemoglobin is decreased somewhat in the last one we have. Creatinine has also decreased. Will follow up with urology. Patient is already on antibiotics. Urine culture was sent.    Jasper Riling. Alvino Chapel, MD 04/05/14 2302

## 2014-04-05 NOTE — ED Notes (Addendum)
RN at beside x2 to insert F/C #18Fr. Urojet used prior to insertion. Pt tolerated well. Noted small clots in urine. Pt output of 453ml with immediately returned.

## 2014-04-05 NOTE — ED Notes (Signed)
Pt awake. Verbally responsive. Resp even and unlabored. ABC's intact. F/C continues to have output of 533ml more of amber urine with total output of 925ml.

## 2014-04-05 NOTE — Discharge Instructions (Signed)
Call the Urologists on Monday.

## 2014-04-07 LAB — URINE CULTURE
COLONY COUNT: NO GROWTH
Culture: NO GROWTH

## 2014-05-01 ENCOUNTER — Ambulatory Visit (INDEPENDENT_AMBULATORY_CARE_PROVIDER_SITE_OTHER): Payer: Medicare Other | Admitting: Internal Medicine

## 2014-05-01 ENCOUNTER — Encounter: Payer: Self-pay | Admitting: Internal Medicine

## 2014-05-01 VITALS — BP 124/62 | HR 70 | Temp 98.4°F | Ht 70.0 in | Wt 175.2 lb

## 2014-05-01 DIAGNOSIS — Z0000 Encounter for general adult medical examination without abnormal findings: Secondary | ICD-10-CM | POA: Diagnosis not present

## 2014-05-01 DIAGNOSIS — M25551 Pain in right hip: Secondary | ICD-10-CM | POA: Diagnosis not present

## 2014-05-01 MED ORDER — TEMAZEPAM 15 MG PO CAPS: 15.0000 mg | ORAL_CAPSULE | Freq: Every day | ORAL | Status: DC

## 2014-05-01 MED ORDER — HYDROCODONE-ACETAMINOPHEN 5-325 MG PO TABS: 1.0000 | ORAL_TABLET | Freq: Four times a day (QID) | ORAL | Status: DC | PRN

## 2014-05-01 NOTE — Progress Notes (Signed)
Subjective:    Patient ID: Edward [name], male    DOB: 1929/06/28, 80 y.o.   MRN: [id number]  HPI   Here for medicare wellness  Diet: heart healthy Physical activity: sedentary Depression/mood screen: negative Hearing: intact to whispered voice Visual acuity: grossly normal, performs annual eye exam  ADLs: capable Fall risk: none Home safety: good Cognitive evaluation: intact to orientation, naming, recall and repetition EOL planning: adv directives not in place - reviewed importance of same  I have personally reviewed and have noted 1. The patient's medical and social history 2. Their use of alcohol, tobacco or illicit drugs 3. Their current medications and supplements 4. The patient's functional ability including ADL's, fall risks, home safety risks and hearing or visual impairment. 5. Diet and physical activities 6. Evidence for depression or mood disorders  Also reviewed chronic medical issues and interval medical events  Past Medical History  Diagnosis Date  . Gout     "only once in my lifetime" (12/18/2012)  . Depression   . PVD (peripheral vascular disease)     s/p B CEA  . COPD (chronic obstructive pulmonary disease)      PFT 6.19.07: FEV1 465  ratio 60 with 25% response to B2.  > PFTs 8.29.07: FEV1 61%  ratio 46% no better after B2.  > add on advair 12.20.11-improved 1.31.12  . AAA (abdominal aortic aneurysm)   . Melanosis coli   . Internal hemorrhoids   . Carotid artery occlusion   . Aortic valve disorder   . Chronic diastolic heart failure 00/05/7492  . CAD (coronary artery disease), native coronary artery     CABG w LIMA to LAD, SVG to dx, OM, RCA 1989 Dr. Arlyce Dice for 3VD PTCA of OM, 1999 and 2000 Cath showed occlusion of left main and RCA with stenosis in OM Redo redo CABG w SVG to OM, SVG to RCA8/10/00 Dr. Cyndia Bent   . Hyperlipidemia   . BPH (benign prostatic hypertrophy)   . Hypertensive heart disease     Change toprol to bisoprolol 10 mg daily on trial  basis  05/21/2012  - notified by [facility] non adherent 07/17/2012    . Myocardial infarction 1979  . Chronic kidney disease stage III (GFR 30-59 ml/min)   . Congenital absence of kidney     "noted during AAA repair; never knew it before" (12/18/2012)  . LBBB (left bundle branch block)   . Second degree heart block 12/18/2012    s/p MDT Adapta L pacemaker 12-20-2012 by Dr Caryl Comes   Family History  Problem Relation Age of Onset  . Heart disease Mother   . Colon cancer Neg Hx    History  Substance Use Topics  . Smoking status: Former Smoker -- 1.50 packs/day for 50 years    Types: Cigarettes    Quit date: 05/30/1996  . Smokeless tobacco: Never Used  . Alcohol Use: 1.8 oz/week    3 Cans of beer per week     Comment: 12/18/2012 "I'll have 2-3 beers/wk; sometimes I'll go for awhile and not have any"    Review of Systems  Constitutional: Negative for fever, activity change, appetite change, fatigue and unexpected weight change.  Respiratory: Negative for cough, chest tightness, shortness of breath and wheezing.   Cardiovascular: Negative for chest pain, palpitations and leg swelling.  Neurological: Negative for dizziness, weakness and headaches.  Psychiatric/Behavioral: Negative for dysphoric mood. The patient is not nervous/anxious.   All other systems reviewed and are negative.  Objective:   Physical Exam  BP 124/62 mmHg  Pulse 70  Temp(Src) 98.4 F (36.9 C) (Oral)  Ht 5\' 10"  (1.778 m)  Wt 175 lb 4 oz (79.493 kg)  BMI 25.15 kg/m2  SpO2 98% Wt Readings from Last 3 Encounters:  05/01/14 175 lb 4 oz (79.493 kg)  04/05/14 185 lb (83.915 kg)  12/30/13 191 lb (86.637 kg)   Constitutional: he appears well-developed and well-nourished. No distress.  Neck: Normal range of motion. Neck supple. No JVD present. No thyromegaly present.  Cardiovascular: Normal rate, regular rhythm and normal heart sounds.  No murmur heard. No BLE edema. Pulmonary/Chest: Effort normal and breath sounds  normal. No respiratory distress. he has no wheezes.  Musculoskeletal: Tenderness to palpation over right greater trochanteric bursa - no pain over anterior palpation of groin. Full range of motion Psychiatric: he has a normal mood and affect. His behavior is normal. Judgment and thought content normal.   Lab Results  Component Value Date   WBC 8.1 04/05/2014   HGB 10.5* 04/05/2014   HCT 31.0* 04/05/2014   PLT 192 04/05/2014   GLUCOSE 93 04/05/2014   CHOL 122 10/30/2013   TRIG 95.0 10/30/2013   HDL 50.30 10/30/2013   LDLCALC 53 10/30/2013   ALT 16 10/30/2013   AST 21 10/30/2013   NA 136* 04/05/2014   K 4.1 04/05/2014   CL 101 04/05/2014   CREATININE 2.20* 04/05/2014   BUN 26* 04/05/2014   CO2 22 12/30/2013   TSH 2.63 10/30/2013   INR 1.10 12/18/2012    No results found.     Assessment & Plan:   AWV/z00.00 - Today patient counseled on age appropriate routine health concerns for screening and prevention, each reviewed and up to date or declined. Immunizations reviewed and up to date or declined. Labs ordered and reviewed. Risk factors for depression reviewed and negative. Hearing function and visual acuity are intact. ADLs screened and addressed as needed. Functional ability and level of safety reviewed and appropriate. Education, counseling and referrals performed based on assessed risks today. Patient provided with a copy of personalized plan for preventive services.  Right lateral hip pain. Symptoms consistent with trochanteric bursitis. Given chronic anticoagulation use, will refer to sports medicine for ultrasound-guided evaluation and injection as needed

## 2014-05-01 NOTE — Progress Notes (Signed)
Pre visit review using our clinic review tool, if applicable. No additional management support is needed unless otherwise documented below in the visit note. 

## 2014-05-01 NOTE — Patient Instructions (Addendum)
It was good to see you today.  We have reviewed your prior records including labs and tests today  Health Maintenance reviewed - all recommended immunizations and age-appropriate screenings are up-to-date.  We will make an appointment with Dr. Tamala Julian for consideration of injection into your hip for pain control  Medications reviewed and updated, no changes recommended at this time. Refill on medication(s) as discussed today.  Please schedule followup in 6 months for semiannual exam and labs, call sooner if problems.  Advance Directive Advance directives are the legal documents that allow you to make choices about your health care and medical treatment if you cannot speak for yourself. Advance directives are a way for you to communicate your wishes to family, friends, and health care providers. The specified people can then convey your decisions about end-of-life care to avoid confusion if you should become unable to communicate. Ideally, the process of discussing and writing advance directives should happen over time rather than making decisions all at once. Advance directives can be modified as your situation changes, and you can change your mind at any time, even after you have signed the advance directives. Each state has its own laws regarding advance directives. You may want to check with your health care provider, attorney, or state representative about the law in your state. Below are some examples of advance directives. LIVING WILL A living will is a set of instructions documenting your wishes about medical care when you cannot care for yourself. It is used if you become:  Terminally ill.  Incapacitated.  Unable to communicate.  Unable to make decisions. Items to consider in your living will include:  The use or non-use of life-sustaining equipment, such as dialysis machines and breathing machines (ventilators).  A do not resuscitate (DNR) order, which is the instruction not to  use cardiopulmonary resuscitation (CPR) if breathing or heartbeat stops.  Tube feeding.  Withholding of food and fluids.  Comfort (palliative) care when the goal becomes comfort rather than a cure.  Organ and tissue donation. A living will does not give instructions about distribution of your money and property if you should pass away. It is advisable to seek the expert advice of a lawyer in drawing up a will regarding your possessions. Decisions about taxes, beneficiaries, and asset distribution will be legally binding. This process can relieve your family and friends of any burdens surrounding disputes or questions that may come up about the allocation of your assets. DO NOT RESUSCITATE (DNR) A do not resuscitate (DNR) order is a request to not have CPR in the event that your heart stops beating or you stop breathing. Unless given other instructions, a health care provider will try to help any patient whose heart has stopped or who has stopped breathing.  HEALTH CARE PROXY AND DURABLE POWER OF ATTORNEY FOR HEALTH CARE A health care proxy is a person (agent) appointed to make medical decisions for you if you cannot. Generally, people choose someone they know well and trust to represent their preferences when they can no longer do so. You should be sure to ask this person for agreement to act as your agent. An agent may have to exercise judgment in the event of a medical decision for which your wishes are not known. The durable power of attorney for health care is the legal document that names your health care proxy. Once written, it should be:  Signed.  Notarized.  Dated.  Copied.  Witnessed.  Incorporated into your medical record.  You may also want to appoint someone to manage your financial affairs if you cannot. This is called a durable power of attorney for finances. It is a separate legal document from the durable power of attorney for health care. You may choose the same person or  someone different from your health care proxy to act as your agent in financial matters. Document Released: 08/23/2007 Document Revised: 05/21/2013 Document Reviewed: 10/03/2012 [facility] Patient Information 2015 [place], [place]. This information is not intended to replace advice given to you by your health care provider. Make sure you discuss any questions you have with your health care provider.

## 2014-05-05 ENCOUNTER — Ambulatory Visit (INDEPENDENT_AMBULATORY_CARE_PROVIDER_SITE_OTHER): Payer: Medicare Other | Admitting: Family Medicine

## 2014-05-05 ENCOUNTER — Other Ambulatory Visit (INDEPENDENT_AMBULATORY_CARE_PROVIDER_SITE_OTHER): Payer: Medicare Other

## 2014-05-05 VITALS — BP 110/64 | HR 70 | Ht 70.0 in | Wt 175.0 lb

## 2014-05-05 DIAGNOSIS — M7061 Trochanteric bursitis, right hip: Secondary | ICD-10-CM

## 2014-05-05 DIAGNOSIS — M25551 Pain in right hip: Secondary | ICD-10-CM

## 2014-05-05 NOTE — Progress Notes (Signed)
[facility] [address], [place] [address] Phone: [phone] Subjective:    I'm seeing this patient by the request  of:  Gwendolyn Grant, MD   CC: right hip pain   OTL:[id number] Edward [name] is a 80 y.o. male coming in with complaint of right hip pain. Patient states it is on the lateral aspect of his hip. States that it started severely hurting even when he rolls onto that side. Patient denies any radiation down the leg or any numbness. Denies any type of injury. Denies any groin pain or back pain that is associated with it. Patient is severity of 8 out of 10. Patient denies any fevers or chills or any, weight loss. This is affecting his daily activities and is waking him up at night. No home modalities has helped.     Past medical history, social, surgical and family history all reviewed in electronic medical record.   Review of Systems: No headache, visual changes, nausea, vomiting, diarrhea, constipation, dizziness, abdominal pain, skin rash, fevers, chills, night sweats, weight loss, swollen lymph nodes, body aches, joint swelling, muscle aches, chest pain, shortness of breath, mood changes.   Objective Blood pressure 110/64, pulse 70, height 5\' 10"  (1.778 m), weight 175 lb (79.379 kg), SpO2 97 %.  General: No apparent distress alert and oriented x3 mood and affect normal, dressed appropriately.  HEENT: Pupils equal, extraocular movements intact  Respiratory: Patient's speak in full sentences and does not appear short of breath  Cardiovascular: No lower extremity edema, non tender, no erythema  Skin: Warm dry intact with no signs of infection or rash on extremities or on axial skeleton.  Abdomen: Soft nontender  Neuro: Cranial nerves II through XII are intact, neurovascularly intact in all extremities with 2+ DTRs and 2+ pulses.  Lymph: No lymphadenopathy of posterior or anterior cervical chain or axillae bilaterally.  Gait normal with  good balance and coordination.  MSK:  Non tender with full range of motion and good stability and symmetric strength and tone of shoulders, elbows, wrist,  knee and ankles bilaterally.   Hip: ROM IR: 25 Deg, ER: 45 Deg, Flexion: 120 Deg, Extension: 100 Deg, Abduction: 45 Deg, Adduction: 5 Deg Strength IR: 5/5, ER: 5/5, Flexion: 5/5, Extension: 5/5, Abduction: 4/5, Adduction: 4/5 Pelvic alignment unremarkable to inspection and palpation. Standing hip rotation and gait without trendelenburg sign / unsteadiness. Tender to palpation over the greater trochanteric area severely Mild tenderness over the piriformis Mild positive Faber No SI joint tenderness and normal minimal SI movement.  MSK US performed of: Right This study was ordered, performed, and interpreted by Charlann Boxer D.O.  Hip: Trochanteric bursa with significant hypoechoic changes and swelling Acetabular labrum visualized and without tears, displacement, or effusion in joint. Femoral neck appears unremarkable without increased power doppler signal along Cortex.  IMPRESSION:  Greater trochanter bursitis   Procedure: Real-time Ultrasound Guided Injection of right greater trochanteric bursitis secondary to patient's body habitus Device: GE Logiq E  Ultrasound guided injection is preferred based studies that show increased duration, increased effect, greater accuracy, decreased procedural pain, increased response rate, and decreased cost with ultrasound guided versus blind injection.  Verbal informed consent obtained.  Time-out conducted.  Noted no overlying erythema, induration, or other signs of local infection.  Skin prepped in a sterile fashion.  Local anesthesia: Topical Ethyl chloride.  With sterile technique and under real time ultrasound guidance:  Greater trochanteric area was visualized and patient's bursa was noted.  A 22-gauge 3 inch needle was inserted and 4 cc of 0.5% Marcaine and 1 cc of Kenalog 40 mg/dL was  injected. Pictures taken Completed without difficulty  Pain immediately resolved suggesting accurate placement of the medication.  Advised to call if fevers/chills, erythema, induration, drainage, or persistent bleeding.  Images permanently stored and available for review in the ultrasound unit.  Impression: Technically successful ultrasound guided injection.     Impression and Recommendations:     This case required medical decision making of moderate complexity.

## 2014-05-05 NOTE — Assessment & Plan Note (Signed)
Patient does have more of a greater trochanteric bursitis. Patient did do well after the injection. We discussed icing regimen, home exercises, and we will avoid oral anti-inflammatories secondary to patient's comorbidities. I believe the patient is going to do well overall. We discussed over-the-counter medications that could be helpful. Patient come back in 3 weeks for further evaluation and treatment.

## 2014-05-05 NOTE — Patient Instructions (Signed)
Good to meet you Ice 20 minutes 2 times daily. Usually after activity and before bed. Exercises 3 times a week.  Wear good shoes.  Try capsicin topically if you need anything.  Vitamin D 2000 IU daily See me again in 4 weeks.

## 2014-05-08 ENCOUNTER — Encounter (HOSPITAL_COMMUNITY): Payer: Self-pay | Admitting: Internal Medicine

## 2014-05-29 ENCOUNTER — Other Ambulatory Visit: Payer: Self-pay | Admitting: Urology

## 2014-06-03 ENCOUNTER — Other Ambulatory Visit: Payer: Self-pay | Admitting: Family Medicine

## 2014-06-03 ENCOUNTER — Ambulatory Visit (INDEPENDENT_AMBULATORY_CARE_PROVIDER_SITE_OTHER): Payer: Medicare Other | Admitting: Family Medicine

## 2014-06-03 ENCOUNTER — Encounter: Payer: Self-pay | Admitting: Family Medicine

## 2014-06-03 ENCOUNTER — Ambulatory Visit (INDEPENDENT_AMBULATORY_CARE_PROVIDER_SITE_OTHER)
Admission: RE | Admit: 2014-06-03 | Discharge: 2014-06-03 | Disposition: A | Discharge status: Home / Self Care | Payer: Medicare Other | Source: Ambulatory Visit | Attending: Family Medicine | Admitting: Family Medicine

## 2014-06-03 VITALS — BP 116/62 | HR 70 | Ht 70.0 in | Wt 174.0 lb

## 2014-06-03 DIAGNOSIS — M25551 Pain in right hip: Secondary | ICD-10-CM

## 2014-06-03 DIAGNOSIS — M7061 Trochanteric bursitis, right hip: Secondary | ICD-10-CM

## 2014-06-03 NOTE — Assessment & Plan Note (Addendum)
Discussing it with patient at great length. We discussed that patient needed more attention to detail on the exercises and we did have him work with the efflux trainer today. Please see previous note on that. In addition patient will continue with the icing regimen and do this on a more regular basis. We discussed the over-the-counter medications that could be beneficial and helpful as well as scheduled Tylenol. Patient will try to make these changes and come back again in 3 weeks. At that time if continuing to have difficulty then patient will have to come back and we may need to consider repeat injection. Differential includes lumbar radiculopathy but patient does not have any back pain associated with this. X-rays ordered today  Total time with patient today exceeded 40 minutes. This is face-to-face time  Spent greater than 25 minutes with patient face-to-face and had greater than 50% of counseling including as described above in assessment and plan, can greater than 15 minutes with athletic trainer going over home exercises.

## 2014-06-03 NOTE — Progress Notes (Signed)
[id number]; 15 additional minutes spent for Therapeutic exercises as stated in above notes.  This included exercises focusing on stretching, strengthening, with significant focus on eccentric aspects.   Proper technique shown and discussed handout in great detail with ATC.  All questions were discussed and answered.

## 2014-06-03 NOTE — Patient Instructions (Signed)
Good to see you Ice 10 minutest at night at least  do the exercises 3 times a week.  Pennsaid topically 2 times a day.  Tylenol 325mg  3 times daily no matter what See me again in 4 weeks to make sure you are improving.

## 2014-06-03 NOTE — Progress Notes (Signed)
  [facility] [place], [place] [address] Phone: [phone] Subjective:     CC: right hip pain follow-up  BZJ:IRCVELFYBO Edward [name] is a 81 y.o. male coming in with complaint of right hip pain. Patient was seen previously was diagnosed with more of a greater trochanteric bursitis. Patient was given an injection 3 weeks ago and states that this did help for approximately 2 weeks and then the pain started to come back. Still give the most pain at night when he tries to roll onto that size. Patient states that this can wake him up from sleep. Denies any radiation down the leg or any association with his back pain. He does any weakness or numbness. Patient has not been doing the home exercises or the icing protocol. Patient did not get the over-the-counter medications as suggested previously.     Past medical history, social, surgical and family history all reviewed in electronic medical record.   Review of Systems: No headache, visual changes, nausea, vomiting, diarrhea, constipation, dizziness, abdominal pain, skin rash, fevers, chills, night sweats, weight loss, swollen lymph nodes, body aches, joint swelling, muscle aches, chest pain, shortness of breath, mood changes.   Objective Blood pressure 116/62, pulse 70, height 5\' 10"  (1.778 m), weight 174 lb (78.926 kg), SpO2 97 %.  General: No apparent distress alert and oriented x3 mood and affect normal, dressed appropriately.  HEENT: Pupils equal, extraocular movements intact  Respiratory: Patient's speak in full sentences and does not appear short of breath  Cardiovascular: No lower extremity edema, non tender, no erythema  Skin: Warm dry intact with no signs of infection or rash on extremities or on axial skeleton.  Abdomen: Soft nontender  Neuro: Cranial nerves II through XII are intact, neurovascularly intact in all extremities with 2+ DTRs and 2+ pulses.  Lymph: No lymphadenopathy of  posterior or anterior cervical chain or axillae bilaterally.  Gait normal with good balance and coordination.  MSK:  Non tender with full range of motion and good stability and symmetric strength and tone of shoulders, elbows, wrist,  knee and ankles bilaterally.   Hip: Right ROM IR: 25 Deg, ER: 45 Deg, Flexion: 120 Deg, Extension: 100 Deg, Abduction: 45 Deg, Adduction: 5 Deg Strength IR: 5/5, ER: 5/5, Flexion: 5/5, Extension: 5/5, Abduction: 4/5, Adduction: 4/5 Pelvic alignment unremarkable to inspection and palpation. Standing hip rotation and gait without trendelenburg sign / unsteadiness. Tender to palpation over the greater trochanteric area but moderately less than previous exam Mild tenderness over the piriformis Mild positive Faber No SI joint tenderness and normal minimal SI movement. Contralateral hip unremarkable.     Impression and Recommendations:     This case required medical decision making of moderate complexity.

## 2014-06-06 ENCOUNTER — Encounter (HOSPITAL_COMMUNITY)
Admission: RE | Admit: 2014-06-06 | Discharge: 2014-06-06 | Disposition: A | Discharge status: Home / Self Care | Payer: Medicare Other | Source: Ambulatory Visit | Attending: Urology | Admitting: Urology

## 2014-06-06 ENCOUNTER — Ambulatory Visit (HOSPITAL_COMMUNITY)
Admission: RE | Admit: 2014-06-06 | Discharge: 2014-06-06 | Disposition: A | Discharge status: Home / Self Care | Payer: Medicare Other | Source: Ambulatory Visit | Attending: Anesthesiology | Admitting: Anesthesiology

## 2014-06-06 ENCOUNTER — Encounter (HOSPITAL_COMMUNITY): Payer: Self-pay

## 2014-06-06 DIAGNOSIS — R0989 Other specified symptoms and signs involving the circulatory and respiratory systems: Secondary | ICD-10-CM | POA: Diagnosis not present

## 2014-06-06 DIAGNOSIS — R059 Cough, unspecified: Secondary | ICD-10-CM

## 2014-06-06 DIAGNOSIS — Z01818 Encounter for other preprocedural examination: Secondary | ICD-10-CM | POA: Diagnosis present

## 2014-06-06 DIAGNOSIS — R0602 Shortness of breath: Secondary | ICD-10-CM | POA: Diagnosis not present

## 2014-06-06 DIAGNOSIS — R05 Cough: Secondary | ICD-10-CM

## 2014-06-06 HISTORY — DX: Zoster without complications: B02.9

## 2014-06-06 LAB — BASIC METABOLIC PANEL
ANION GAP: 7 (ref 5–15)
BUN: 23 mg/dL (ref 6–23)
CO2: 28 mmol/L (ref 19–32)
CREATININE: 1.67 mg/dL — AB (ref 0.50–1.35)
Calcium: 9.2 mg/dL (ref 8.4–10.5)
Chloride: 105 mEq/L (ref 96–112)
GFR calc Af Amer: 42 mL/min — ABNORMAL LOW (ref >90)
GFR calc non Af Amer: 37 mL/min — ABNORMAL LOW (ref >90)
Glucose, Bld: 136 mg/dL — ABNORMAL HIGH (ref 70–99)
Potassium: 4.4 mmol/L (ref 3.5–5.1)
Sodium: 140 mmol/L (ref 135–145)

## 2014-06-06 LAB — CBC
HEMATOCRIT: 38.6 % — AB (ref 39.0–52.0)
Hemoglobin: 12.4 g/dL — ABNORMAL LOW (ref 13.0–17.0)
MCH: 30.6 pg (ref 26.0–34.0)
MCHC: 32.1 g/dL (ref 30.0–36.0)
MCV: 95.3 fL (ref 78.0–100.0)
Platelets: 204 10*3/uL (ref 150–400)
RBC: 4.05 MIL/uL — ABNORMAL LOW (ref 4.22–5.81)
RDW: 14.3 % (ref 11.5–15.5)
WBC: 5.7 10*3/uL (ref 4.0–10.5)

## 2014-06-06 LAB — PROTIME-INR
INR: 1.94 — ABNORMAL HIGH (ref 0.00–1.49)
Prothrombin Time: 22.4 seconds — ABNORMAL HIGH (ref 11.6–15.2)

## 2014-06-06 LAB — APTT: APTT: 41 s — AB (ref 24–37)

## 2014-06-06 NOTE — Patient Instructions (Addendum)
Edward [name]  06/06/2014   Your procedure is scheduled on: Monday jan 18th, 2016  Report to [facility] Main  Entrance and follow signs to               [place] at 745 AM.  Call this number if you have problems the morning of surgery [phone]   Remember: to stop coumadine 4 days before surgery from dr dahlstedt's instrctions.  Do not eat food or drink liquids :After Midnight.     Take these medicines the morning of surgery with A SIP OF WATER: amiodarone, crestor, dulera inhaler, spiriva inhaler                               You may not have any metal on your body including hair pins and              piercings  Do not wear jewelry, make-up, lotions, powders or perfumes.             Do not wear nail polish.  Do not shave  48 hours prior to surgery.              Men may shave face and neck.   Do not bring valuables to the hospital. [place].  Contacts, dentures or bridgework may not be worn into surgery.  Leave suitcase in the car. After surgery it may be brought to your room.                  Please read over the following fact sheets you were given: _____________________________________________________________________             [facility] - Preparing for Surgery Before surgery, you can play an important role.  Because skin is not sterile, your skin needs to be as free of germs as possible.  You can reduce the number of germs on your skin by washing with CHG (chlorahexidine gluconate) soap before surgery.  CHG is an antiseptic cleaner which kills germs and bonds with the skin to continue killing germs even after washing. Please DO NOT use if you have an allergy to CHG or antibacterial soaps.  If your skin becomes reddened/irritated stop using the CHG and inform your nurse when you arrive at Short Stay. Do not shave (including legs and underarms) for at least 48 hours prior to the first CHG  shower.  You may shave your face/neck. Please follow these instructions carefully:  1.  Shower with CHG Soap the night before surgery and the  morning of Surgery.  2.  If you choose to wash your hair, wash your hair first as usual with your  normal  shampoo.  3.  After you shampoo, rinse your hair and body thoroughly to remove the  shampoo.                           4.  Use CHG as you would any other liquid soap.  You can apply chg directly  to the skin and wash                       Gently with a scrungie or clean washcloth.  5.  Apply the CHG Soap to your body ONLY FROM THE NECK DOWN.   Do not use on face/ open                           Wound or open sores. Avoid contact with eyes, ears mouth and genitals (private parts).                       Wash face,  Genitals (private parts) with your normal soap.             6.  Wash thoroughly, paying special attention to the area where your surgery  will be performed.  7.  Thoroughly rinse your body with warm water from the neck down.  8.  DO NOT shower/wash with your normal soap after using and rinsing off  the CHG Soap.                9.  Pat yourself dry with a clean towel.            10.  Wear clean pajamas.            11.  Place clean sheets on your bed the night of your first shower and do not  sleep with pets. Day of Surgery : Do not apply any lotions/deodorants the morning of surgery.  Please wear clean clothes to the hospital/surgery center.  FAILURE TO FOLLOW THESE INSTRUCTIONS MAY RESULT IN THE CANCELLATION OF YOUR SURGERY PATIENT SIGNATURE_________________________________  NURSE SIGNATURE__________________________________  ________________________________________________________________________ [facility] - Preparing for Surgery Before surgery, you can play an important role.  Because skin is not sterile, your skin needs to be as free of germs as possible.  You can reduce the number of germs on your skin by washing with CHG  (chlorahexidine gluconate) soap before surgery.  CHG is an antiseptic cleaner which kills germs and bonds with the skin to continue killing germs even after washing. Please DO NOT use if you have an allergy to CHG or antibacterial soaps.  If your skin becomes reddened/irritated stop using the CHG and inform your nurse when you arrive at Short Stay. Do not shave (including legs and underarms) for at least 48 hours prior to the first CHG shower.  You may shave your face/neck. Please follow these instructions carefully:  1.  Shower with CHG Soap the night before surgery and the  morning of Surgery.  2.  If you choose to wash your hair, wash your hair first as usual with your  normal  shampoo.  3.  After you shampoo, rinse your hair and body thoroughly to remove the  shampoo.                           4.  Use CHG as you would any other liquid soap.  You can apply chg directly  to the skin and wash                       Gently with a scrungie or clean washcloth.  5.  Apply the CHG Soap to your body ONLY FROM THE NECK DOWN.   Do not use on face/ open                           Wound or open sores. Avoid contact with eyes, ears mouth  and genitals (private parts).                       Wash face,  Genitals (private parts) with your normal soap.             6.  Wash thoroughly, paying special attention to the area where your surgery  will be performed.  7.  Thoroughly rinse your body with warm water from the neck down.  8.  DO NOT shower/wash with your normal soap after using and rinsing off  the CHG Soap.                9.  Pat yourself dry with a clean towel.            10.  Wear clean pajamas.            11.  Place clean sheets on your bed the night of your first shower and do not  sleep with pets. Day of Surgery : Do not apply any lotions/deodorants the morning of surgery.  Please wear clean clothes to the hospital/surgery center.  FAILURE TO FOLLOW THESE INSTRUCTIONS MAY RESULT IN THE CANCELLATION OF  YOUR SURGERY PATIENT SIGNATURE_________________________________  NURSE SIGNATURE__________________________________  ________________________________________________________________________

## 2014-06-06 NOTE — Progress Notes (Addendum)
Patient reports green sputum with cough last 4 or 5 days, getting chest xray with pre op today. Echo 12-04-2013 dr Wynonia Lawman on chart ekg 12-30-13 dr Wynonia Lawman on chart lov dr Wynonia Lawman [phone] on chart

## 2014-06-06 NOTE — Progress Notes (Signed)
bmet results faxed by epic to dr borden

## 2014-06-11 ENCOUNTER — Encounter: Payer: Self-pay | Admitting: Gastroenterology

## 2014-06-11 ENCOUNTER — Ambulatory Visit (INDEPENDENT_AMBULATORY_CARE_PROVIDER_SITE_OTHER)
Admission: RE | Admit: 2014-06-11 | Discharge: 2014-06-11 | Disposition: A | Discharge status: Home / Self Care | Payer: Medicare Other | Source: Ambulatory Visit | Attending: Gastroenterology | Admitting: Gastroenterology

## 2014-06-11 ENCOUNTER — Ambulatory Visit (INDEPENDENT_AMBULATORY_CARE_PROVIDER_SITE_OTHER): Payer: Medicare Other | Admitting: Gastroenterology

## 2014-06-11 VITALS — BP 150/76 | HR 72 | Ht 70.0 in | Wt 169.6 lb

## 2014-06-11 DIAGNOSIS — R1032 Left lower quadrant pain: Secondary | ICD-10-CM

## 2014-06-11 DIAGNOSIS — K5900 Constipation, unspecified: Secondary | ICD-10-CM

## 2014-06-11 MED ORDER — IOHEXOL 300 MG/ML  SOLN
100.0000 mL | Freq: Once | INTRAMUSCULAR | Status: AC | PRN
  Administered 2014-06-11: 100 mL via INTRAVENOUS

## 2014-06-11 NOTE — Progress Notes (Signed)
Reviewed and agree with management plan.  Izzak Fries T. Wilberta Dorvil, MD FACG 

## 2014-06-11 NOTE — Patient Instructions (Addendum)
Please purchase a bottle of Magnesium Citrate (laxative section of drug store) and drink over ice tonight.  Please begin taking your Miralax 1 capful every day again. ____________________________________________________________________________________________________________________________________________________________________________ Edward [name] have been scheduled for a CT scan of the abdomen and pelvis at [facility] ([address]---this is in the same building as [facility] [employer]).   You are scheduled on TODAY at 1:00 pm. You should arrive 15 minutes prior to your appointment time for registration. Please follow the written instructions below on the day of your exam:  WARNING: IF YOU ARE ALLERGIC TO IODINE/X-RAY DYE, PLEASE NOTIFY RADIOLOGY IMMEDIATELY AT [phone]! YOU WILL BE GIVEN A 13 HOUR PREMEDICATION PREP.  1) Do not eat or drink anything after 9:00 am (4 hours prior to your test) 2) You have been given 2 bottles of oral contrast to drink. The solution may taste better if refrigerated, but do NOT add ice or any other liquid to this solution. Shake well before drinking.    Drink 1 bottle of contrast @ 11:00 am (2 hours prior to your exam)  Drink 1 bottle of contrast @ 12:00 pm (1 hour prior to your exam)  You may take any medications as prescribed with a small amount of water except for the following: Metformin, Glucophage, Glucovance, Avandamet, Riomet, Fortamet, Actoplus Met, Janumet, Glumetza or Metaglip. The above medications must be held the day of the exam AND 48 hours after the exam.  The purpose of you drinking the oral contrast is to aid in the visualization of your intestinal tract. The contrast solution may cause some diarrhea. Before your exam is started, you will be given a small amount of fluid to drink. Depending on your individual set of symptoms, you may also receive an intravenous injection of x-ray contrast/dye. Plan on being at [facility] for 30  minutes or long, depending on the type of exam you are having performed.  This test typically takes 30-45 minutes to complete.  If you have any questions regarding your exam or if you need to reschedule, you may call the CT department at [phone] between the hours of 8:00 am and 5:00 pm, Monday-Friday.  ________________________________________________________________________ CC:Dr Gwendolyn Grant

## 2014-06-11 NOTE — Progress Notes (Signed)
     06/11/2014 Edward [name] [id number] [birth year]   History of Present Illness:  This is an 81 year old male how is previously known to Dr. Fuller Plan.   He was last seen in 07/2011 for complaints of constipation.  His last colonoscopy was in 07/2007 at which time he was found to have only internal hemorrhoids and mild melanosis coli.    He presents to our office today as a walk-in patient with complaints of abdominal pain and constipation.  He says that these symptoms began suddenly 6 days ago and have been persistent.  He has not moved his bowels in 6 days, but the pain started before the constipation began.  He says that the pain seemed to be all over his lower abdomen at first, but is now more on the left side.  He says that it hurts the most when he is lying down and tries to sit up.  He was previously taking Miralax every other day but it was working too well so recently he had back off of it to every few days.  Now, since the constipation he has take 3 or 4 doses without results.  He denies any nausea, vomiting, fevers, chills.  He had hydrocodone listed on his medication list, but says that he does not take it; says that the only thing that he takes for pain is tylenol.    Current Medications, Allergies, Past Medical History, Past Surgical History, Family History and Social History were reviewed in [employer] record.   Physical Exam: BP 150/76 mmHg  Pulse 72  Ht 5\' 10"  (1.778 m)  Wt 169 lb 9.6 oz (76.93 kg)  BMI 24.34 kg/m2 General: Well developed white male in no acute distress Head: Normocephalic and atraumatic Eyes:  Sclerae anicteric, conjunctiva pink  Ears: Normal auditory acuity Lungs: Clear throughout to auscultation Heart: Regular rate and rhythm Abdomen: Soft, non-distended.  Normal bowel sounds.  TTP in LLQ without R/R/G. Musculoskeletal: Symmetrical with no gross deformities  Extremities: No edema  Neurological: Alert oriented x 4, grossly  non-focal Psychological:  Alert and cooperative. Normal mood and affect  Assessment and Recommendations: -LLQ abdominal pain:  He does not have a history of diverticulosis, but will rule out diverticulitis, etc with CT scan of the abdomen and pelvis (scheduled for today).  ? If the pain is related to his constipation, however, the pain started before the constipation began.  ? Musculoskeletal.   -Constipation:  Will drink a bottle of magnesium citrate this evening and then begin taking his Miralax daily again for now.

## 2014-06-16 ENCOUNTER — Encounter (HOSPITAL_COMMUNITY): Payer: Self-pay

## 2014-06-16 ENCOUNTER — Inpatient Hospital Stay (HOSPITAL_COMMUNITY)
Admission: RE | Admit: 2014-06-16 | Discharge: 2014-06-28 | DRG: 668 | Disposition: A | Discharge status: Skilled Nursing Facility | Payer: Medicare Other | Source: Ambulatory Visit | Attending: Urology | Admitting: Urology

## 2014-06-16 ENCOUNTER — Encounter (HOSPITAL_COMMUNITY)
Admission: RE | Disposition: A | Discharge status: Skilled Nursing Facility | Payer: Self-pay | Source: Ambulatory Visit | Attending: Urology

## 2014-06-16 ENCOUNTER — Ambulatory Visit (HOSPITAL_COMMUNITY): Payer: Medicare Other | Admitting: Registered Nurse

## 2014-06-16 DIAGNOSIS — Z950 Presence of cardiac pacemaker: Secondary | ICD-10-CM

## 2014-06-16 DIAGNOSIS — N3289 Other specified disorders of bladder: Secondary | ICD-10-CM | POA: Diagnosis present

## 2014-06-16 DIAGNOSIS — D62 Acute posthemorrhagic anemia: Secondary | ICD-10-CM | POA: Diagnosis not present

## 2014-06-16 DIAGNOSIS — I252 Old myocardial infarction: Secondary | ICD-10-CM

## 2014-06-16 DIAGNOSIS — Z79899 Other long term (current) drug therapy: Secondary | ICD-10-CM

## 2014-06-16 DIAGNOSIS — J189 Pneumonia, unspecified organism: Secondary | ICD-10-CM | POA: Diagnosis not present

## 2014-06-16 DIAGNOSIS — I5043 Acute on chronic combined systolic (congestive) and diastolic (congestive) heart failure: Secondary | ICD-10-CM | POA: Diagnosis not present

## 2014-06-16 DIAGNOSIS — Z87891 Personal history of nicotine dependence: Secondary | ICD-10-CM

## 2014-06-16 DIAGNOSIS — I4891 Unspecified atrial fibrillation: Secondary | ICD-10-CM | POA: Diagnosis present

## 2014-06-16 DIAGNOSIS — R310 Gross hematuria: Secondary | ICD-10-CM | POA: Diagnosis present

## 2014-06-16 DIAGNOSIS — N19 Unspecified kidney failure: Secondary | ICD-10-CM

## 2014-06-16 DIAGNOSIS — N184 Chronic kidney disease, stage 4 (severe): Secondary | ICD-10-CM | POA: Diagnosis present

## 2014-06-16 DIAGNOSIS — E43 Unspecified severe protein-calorie malnutrition: Secondary | ICD-10-CM | POA: Diagnosis present

## 2014-06-16 DIAGNOSIS — T502X5A Adverse effect of carbonic-anhydrase inhibitors, benzothiadiazides and other diuretics, initial encounter: Secondary | ICD-10-CM | POA: Diagnosis not present

## 2014-06-16 DIAGNOSIS — C675 Malignant neoplasm of bladder neck: Principal | ICD-10-CM | POA: Diagnosis present

## 2014-06-16 DIAGNOSIS — R339 Retention of urine, unspecified: Secondary | ICD-10-CM | POA: Diagnosis not present

## 2014-06-16 DIAGNOSIS — E876 Hypokalemia: Secondary | ICD-10-CM | POA: Diagnosis not present

## 2014-06-16 DIAGNOSIS — I959 Hypotension, unspecified: Secondary | ICD-10-CM

## 2014-06-16 DIAGNOSIS — Z7901 Long term (current) use of anticoagulants: Secondary | ICD-10-CM

## 2014-06-16 DIAGNOSIS — I4892 Unspecified atrial flutter: Secondary | ICD-10-CM | POA: Diagnosis not present

## 2014-06-16 DIAGNOSIS — Z6824 Body mass index (BMI) 24.0-24.9, adult: Secondary | ICD-10-CM

## 2014-06-16 DIAGNOSIS — I5032 Chronic diastolic (congestive) heart failure: Secondary | ICD-10-CM

## 2014-06-16 DIAGNOSIS — I739 Peripheral vascular disease, unspecified: Secondary | ICD-10-CM | POA: Diagnosis present

## 2014-06-16 DIAGNOSIS — J9601 Acute respiratory failure with hypoxia: Secondary | ICD-10-CM | POA: Diagnosis not present

## 2014-06-16 DIAGNOSIS — Z96611 Presence of right artificial shoulder joint: Secondary | ICD-10-CM | POA: Diagnosis present

## 2014-06-16 DIAGNOSIS — Y95 Nosocomial condition: Secondary | ICD-10-CM | POA: Diagnosis present

## 2014-06-16 DIAGNOSIS — E8770 Fluid overload, unspecified: Secondary | ICD-10-CM | POA: Diagnosis not present

## 2014-06-16 DIAGNOSIS — E871 Hypo-osmolality and hyponatremia: Secondary | ICD-10-CM | POA: Diagnosis present

## 2014-06-16 DIAGNOSIS — Q600 Renal agenesis, unilateral: Secondary | ICD-10-CM

## 2014-06-16 DIAGNOSIS — R110 Nausea: Secondary | ICD-10-CM

## 2014-06-16 DIAGNOSIS — I2510 Atherosclerotic heart disease of native coronary artery without angina pectoris: Secondary | ICD-10-CM | POA: Diagnosis present

## 2014-06-16 DIAGNOSIS — B3749 Other urogenital candidiasis: Secondary | ICD-10-CM | POA: Diagnosis present

## 2014-06-16 DIAGNOSIS — C670 Malignant neoplasm of trigone of bladder: Secondary | ICD-10-CM | POA: Diagnosis present

## 2014-06-16 DIAGNOSIS — C679 Malignant neoplasm of bladder, unspecified: Secondary | ICD-10-CM | POA: Diagnosis present

## 2014-06-16 DIAGNOSIS — E860 Dehydration: Secondary | ICD-10-CM | POA: Diagnosis present

## 2014-06-16 DIAGNOSIS — J449 Chronic obstructive pulmonary disease, unspecified: Secondary | ICD-10-CM | POA: Diagnosis present

## 2014-06-16 DIAGNOSIS — N179 Acute kidney failure, unspecified: Secondary | ICD-10-CM | POA: Diagnosis not present

## 2014-06-16 DIAGNOSIS — K567 Ileus, unspecified: Secondary | ICD-10-CM | POA: Diagnosis not present

## 2014-06-16 DIAGNOSIS — R079 Chest pain, unspecified: Secondary | ICD-10-CM

## 2014-06-16 DIAGNOSIS — E785 Hyperlipidemia, unspecified: Secondary | ICD-10-CM | POA: Diagnosis present

## 2014-06-16 DIAGNOSIS — Z9049 Acquired absence of other specified parts of digestive tract: Secondary | ICD-10-CM | POA: Diagnosis present

## 2014-06-16 DIAGNOSIS — R0602 Shortness of breath: Secondary | ICD-10-CM

## 2014-06-16 DIAGNOSIS — Z951 Presence of aortocoronary bypass graft: Secondary | ICD-10-CM

## 2014-06-16 HISTORY — PX: TRANSURETHRAL RESECTION OF BLADDER TUMOR: SHX2575

## 2014-06-16 LAB — PROTIME-INR
INR: 2.12 — ABNORMAL HIGH (ref 0.00–1.49)
PROTHROMBIN TIME: 23.9 s — AB (ref 11.6–15.2)

## 2014-06-16 LAB — GLUCOSE, CAPILLARY: Glucose-Capillary: 95 mg/dL (ref 70–99)

## 2014-06-16 SURGERY — TURBT (TRANSURETHRAL RESECTION OF BLADDER TUMOR)
Anesthesia: General

## 2014-06-16 MED ORDER — NITROGLYCERIN 0.4 MG SL SUBL: 0.4000 mg | SUBLINGUAL_TABLET | SUBLINGUAL | Status: DC | PRN

## 2014-06-16 MED ORDER — ONDANSETRON HCL 4 MG/2ML IJ SOLN
4.0000 mg | INTRAMUSCULAR | Status: DC | PRN
  Administered 2014-06-16 – 2014-06-18 (×2): 4 mg via INTRAVENOUS
  Filled 2014-06-16 (×2): qty 2

## 2014-06-16 MED ORDER — LACTATED RINGERS IV SOLN
INTRAVENOUS | Status: DC | PRN
  Administered 2014-06-16: 09:00:00 via INTRAVENOUS

## 2014-06-16 MED ORDER — MITOMYCIN CHEMO FOR BLADDER INSTILLATION 40 MG
40.0000 mg | Freq: Once | INTRAVENOUS | Status: DC
  Administered 2014-06-16: 40 mg via INTRAVESICAL
  Filled 2014-06-16: qty 40

## 2014-06-16 MED ORDER — COLCHICINE 0.6 MG PO TABS: 0.6000 mg | ORAL_TABLET | Freq: Every day | ORAL | Status: DC | PRN

## 2014-06-16 MED ORDER — CEFAZOLIN SODIUM-DEXTROSE 2-3 GM-% IV SOLR
2.0000 g | INTRAVENOUS | Status: AC
  Administered 2014-06-16: 2 g via INTRAVENOUS

## 2014-06-16 MED ORDER — BELLADONNA ALKALOIDS-OPIUM 16.2-60 MG RE SUPP
1.0000 | Freq: Four times a day (QID) | RECTAL | Status: DC | PRN
  Administered 2014-06-16 – 2014-06-17 (×2): 1 via RECTAL
  Filled 2014-06-16 (×2): qty 1

## 2014-06-16 MED ORDER — CEPHALEXIN 250 MG PO CAPS: 250.0000 mg | ORAL_CAPSULE | Freq: Two times a day (BID) | ORAL | Status: DC

## 2014-06-16 MED ORDER — MIDAZOLAM HCL 2 MG/2ML IJ SOLN
INTRAMUSCULAR | Status: AC
  Filled 2014-06-16: qty 2

## 2014-06-16 MED ORDER — LIDOCAINE HCL (CARDIAC) 10 MG/ML IV SOLN
INTRAVENOUS | Status: DC | PRN
  Administered 2014-06-16: 75 mg via INTRAVENOUS

## 2014-06-16 MED ORDER — CEFAZOLIN SODIUM-DEXTROSE 2-3 GM-% IV SOLR
INTRAVENOUS | Status: AC
  Filled 2014-06-16: qty 50

## 2014-06-16 MED ORDER — FENTANYL CITRATE 0.05 MG/ML IJ SOLN
25.0000 ug | INTRAMUSCULAR | Status: DC | PRN
  Administered 2014-06-16: 25 ug via INTRAVENOUS
  Administered 2014-06-16: 50 ug via INTRAVENOUS
  Administered 2014-06-16: 25 ug via INTRAVENOUS

## 2014-06-16 MED ORDER — STERILE WATER FOR IRRIGATION IR SOLN
Status: DC | PRN
  Administered 2014-06-16: 11000 mL

## 2014-06-16 MED ORDER — AMIODARONE HCL 200 MG PO TABS
200.0000 mg | ORAL_TABLET | Freq: Every day | ORAL | Status: DC
Start: 2014-06-17 — End: 2014-06-28
  Administered 2014-06-17 – 2014-06-28 (×11): 200 mg via ORAL
  Filled 2014-06-16 (×12): qty 1

## 2014-06-16 MED ORDER — MOMETASONE FURO-FORMOTEROL FUM 200-5 MCG/ACT IN AERO
2.0000 | INHALATION_SPRAY | Freq: Two times a day (BID) | RESPIRATORY_TRACT | Status: DC
  Administered 2014-06-16 – 2014-06-20 (×8): 2 via RESPIRATORY_TRACT
  Filled 2014-06-16: qty 8.8

## 2014-06-16 MED ORDER — SODIUM CHLORIDE 0.45 % IV SOLN
INTRAVENOUS | Status: DC
  Administered 2014-06-16 – 2014-06-18 (×4): via INTRAVENOUS

## 2014-06-16 MED ORDER — CEPHALEXIN 500 MG PO CAPS
500.0000 mg | ORAL_CAPSULE | Freq: Two times a day (BID) | ORAL | Status: DC
  Administered 2014-06-16 – 2014-06-18 (×4): 500 mg via ORAL
  Filled 2014-06-16 (×5): qty 1

## 2014-06-16 MED ORDER — TIOTROPIUM BROMIDE MONOHYDRATE 18 MCG IN CAPS
18.0000 ug | ORAL_CAPSULE | Freq: Every day | RESPIRATORY_TRACT | Status: DC
  Administered 2014-06-17 – 2014-06-28 (×12): 18 ug via RESPIRATORY_TRACT
  Filled 2014-06-16 (×4): qty 5

## 2014-06-16 MED ORDER — TAMSULOSIN HCL 0.4 MG PO CAPS
0.4000 mg | ORAL_CAPSULE | Freq: Every day | ORAL | Status: DC
  Administered 2014-06-16 – 2014-06-27 (×11): 0.4 mg via ORAL
  Filled 2014-06-16 (×13): qty 1

## 2014-06-16 MED ORDER — MITOMYCIN CHEMO FOR BLADDER INSTILLATION 40 MG: 40.0000 mg | Freq: Once | INTRAVENOUS | Status: DC

## 2014-06-16 MED ORDER — BACITRACIN-NEOMYCIN-POLYMYXIN OINTMENT TUBE
TOPICAL_OINTMENT | CUTANEOUS | Status: DC | PRN
  Administered 2014-06-16: 22:00:00 via TOPICAL
  Filled 2014-06-16 (×2): qty 15

## 2014-06-16 MED ORDER — FENTANYL CITRATE 0.05 MG/ML IJ SOLN
INTRAMUSCULAR | Status: AC
  Filled 2014-06-16: qty 5

## 2014-06-16 MED ORDER — FENTANYL CITRATE 0.05 MG/ML IJ SOLN
INTRAMUSCULAR | Status: DC | PRN
  Administered 2014-06-16 (×3): 25 ug via INTRAVENOUS

## 2014-06-16 MED ORDER — ENSURE COMPLETE PO LIQD
237.0000 mL | Freq: Two times a day (BID) | ORAL | Status: DC
  Administered 2014-06-16 – 2014-06-23 (×10): 237 mL via ORAL

## 2014-06-16 MED ORDER — DEXAMETHASONE SODIUM PHOSPHATE 10 MG/ML IJ SOLN
INTRAMUSCULAR | Status: AC
  Filled 2014-06-16: qty 1

## 2014-06-16 MED ORDER — LIDOCAINE HCL (CARDIAC) 20 MG/ML IV SOLN
INTRAVENOUS | Status: AC
  Filled 2014-06-16: qty 5

## 2014-06-16 MED ORDER — ACETAMINOPHEN 10 MG/ML IV SOLN
1000.0000 mg | Freq: Once | INTRAVENOUS | Status: AC
  Administered 2014-06-16: 1000 mg via INTRAVENOUS
  Filled 2014-06-16: qty 100

## 2014-06-16 MED ORDER — PROPOFOL 10 MG/ML IV BOLUS
INTRAVENOUS | Status: AC
Start: 2014-06-16 — End: 2014-06-16
  Filled 2014-06-16: qty 20

## 2014-06-16 MED ORDER — FENTANYL CITRATE 0.05 MG/ML IJ SOLN
INTRAMUSCULAR | Status: AC
  Filled 2014-06-16: qty 2

## 2014-06-16 MED ORDER — PROPOFOL 10 MG/ML IV BOLUS
INTRAVENOUS | Status: DC | PRN
  Administered 2014-06-16: 130 mg via INTRAVENOUS

## 2014-06-16 MED ORDER — SODIUM CHLORIDE 0.9 % IR SOLN
3000.0000 mL | Status: DC
  Administered 2014-06-16 (×4): 3000 mL

## 2014-06-16 MED ORDER — STERILE WATER FOR IRRIGATION IR SOLN
Status: DC | PRN
  Administered 2014-06-16: 500 mL

## 2014-06-16 MED ORDER — PHENYLEPHRINE HCL 10 MG/ML IJ SOLN
INTRAMUSCULAR | Status: DC | PRN
  Administered 2014-06-16 (×3): 80 ug via INTRAVENOUS

## 2014-06-16 MED ORDER — ONDANSETRON HCL 4 MG/2ML IJ SOLN
INTRAMUSCULAR | Status: AC
  Filled 2014-06-16: qty 2

## 2014-06-16 MED ORDER — POLYETHYLENE GLYCOL 3350 17 GM/SCOOP PO POWD: 0.5000 | ORAL | Status: DC

## 2014-06-16 MED ORDER — OXYBUTYNIN CHLORIDE 5 MG PO TABS
5.0000 mg | ORAL_TABLET | Freq: Three times a day (TID) | ORAL | Status: DC | PRN
  Administered 2014-06-16 – 2014-06-21 (×6): 5 mg via ORAL
  Filled 2014-06-16 (×6): qty 1

## 2014-06-16 MED ORDER — FUROSEMIDE 20 MG PO TABS: 20.0000 mg | ORAL_TABLET | Freq: Every day | ORAL | Status: DC | PRN

## 2014-06-16 MED ORDER — POLYETHYLENE GLYCOL 3350 17 G PO PACK
17.0000 g | PACK | Freq: Every day | ORAL | Status: DC
  Administered 2014-06-16 – 2014-06-24 (×6): 17 g via ORAL
  Filled 2014-06-16 (×12): qty 1

## 2014-06-16 MED ORDER — ONDANSETRON HCL 4 MG/2ML IJ SOLN
INTRAMUSCULAR | Status: DC | PRN
  Administered 2014-06-16: 4 mg via INTRAVENOUS

## 2014-06-16 MED ORDER — TEMAZEPAM 15 MG PO CAPS
15.0000 mg | ORAL_CAPSULE | Freq: Every day | ORAL | Status: DC
  Administered 2014-06-16 – 2014-06-20 (×5): 15 mg via ORAL
  Filled 2014-06-16 (×5): qty 1

## 2014-06-16 MED ORDER — ROSUVASTATIN CALCIUM 20 MG PO TABS
20.0000 mg | ORAL_TABLET | Freq: Every day | ORAL | Status: DC
  Administered 2014-06-17 – 2014-06-27 (×11): 20 mg via ORAL
  Filled 2014-06-16 (×12): qty 1

## 2014-06-16 MED ORDER — GUAIFENESIN ER 600 MG PO TB12: 600.0000 mg | ORAL_TABLET | Freq: Two times a day (BID) | ORAL | Status: DC | PRN

## 2014-06-16 MED ORDER — PROMETHAZINE HCL 25 MG/ML IJ SOLN: 6.2500 mg | INTRAMUSCULAR | Status: DC | PRN

## 2014-06-16 MED ORDER — ACETAMINOPHEN 325 MG PO TABS
650.0000 mg | ORAL_TABLET | Freq: Four times a day (QID) | ORAL | Status: DC | PRN
  Administered 2014-06-16 – 2014-06-19 (×2): 650 mg via ORAL
  Filled 2014-06-16 (×2): qty 2

## 2014-06-16 MED ORDER — HYDROCODONE-ACETAMINOPHEN 5-325 MG PO TABS
1.0000 | ORAL_TABLET | ORAL | Status: DC | PRN
  Administered 2014-06-16 (×2): 1 via ORAL
  Administered 2014-06-17 – 2014-06-20 (×2): 2 via ORAL
  Filled 2014-06-16: qty 2
  Filled 2014-06-16: qty 1
  Filled 2014-06-16 (×2): qty 2

## 2014-06-16 SURGICAL SUPPLY — 21 items
BAG URINE DRAINAGE (UROLOGICAL SUPPLIES) ×3 IMPLANT
BAG URINE LEG 500ML (DRAIN) ×3 IMPLANT
BAG URO CATCHER STRL LF (DRAPE) ×3 IMPLANT
CATH FOLEY 3WAY 30CC 22FR (CATHETERS) ×3 IMPLANT
CATH INTERMIT  6FR 70CM (CATHETERS) ×3 IMPLANT
DRAPE CAMERA CLOSED 9X96 (DRAPES) ×3 IMPLANT
ELECT REM PT RETURN 9FT ADLT (ELECTROSURGICAL) ×3
ELECTRODE REM PT RTRN 9FT ADLT (ELECTROSURGICAL) ×1 IMPLANT
EVACUATOR MICROVAS BLADDER (UROLOGICAL SUPPLIES) IMPLANT
GLOVE BIOGEL M 8.0 STRL (GLOVE) ×3 IMPLANT
GOWN STRL REUS W/ TWL XL LVL3 (GOWN DISPOSABLE) ×1 IMPLANT
GOWN STRL REUS W/TWL XL LVL3 (GOWN DISPOSABLE) ×6 IMPLANT
KIT ASPIRATION TUBING (SET/KITS/TRAYS/PACK) ×3 IMPLANT
LOOPS RESECTOSCOPE DISP (ELECTROSURGICAL) ×3 IMPLANT
MANIFOLD NEPTUNE II (INSTRUMENTS) ×3 IMPLANT
PACK CYSTO (CUSTOM PROCEDURE TRAY) ×3 IMPLANT
PLUG CATH AND CAP STER (CATHETERS) ×3 IMPLANT
SYRINGE IRR TOOMEY STRL 70CC (SYRINGE) ×3 IMPLANT
TUBING CONNECTING 10 (TUBING) ×2 IMPLANT
TUBING CONNECTING 10' (TUBING) ×1
WATER STERILE IRR 3000ML UROMA (IV SOLUTION) ×6 IMPLANT

## 2014-06-16 NOTE — Discharge Instructions (Signed)
Transurethral Resection of Bladder Tumor (TURBT)  °Definition:  °Transurethral Resection of the Bladder Tumor is a surgical procedure used to diagnose and remove tumors within the bladder. TURBT is the most common treatment for early stage bladder cancer.  ° °General instructions:  °Your recent bladder surgery requires very little post hospital care but some definite precautions.  °Despite the fact that no skin incisions were used, the area around the tumor removal site is raw and covered with scabs to promote healing and prevent bleeding. Certain precautions are needed to insure that the scabs are not disturbed over the next 2-4 weeks while the healing proceeds.  °Because the raw surface inside your bladder and the irritating effects of urine you may expect frequency of urination and/or urgency (a stronger desire to urinate) and perhaps even getting up at night more often. This will usually resolve or improve slowly over the healing period. You may see some blood in your urine over the first 6 weeks. Do not be alarmed, even if the urine was clear for a while. Get off your feet and drink lots of fluids until clearing occurs. If you start to pass clots or don't improve call us.  ° °Diet:  °You may return to your normal diet immediately. Because of the raw surface of your bladder, alcohol, spicy foods, foods high in acid and drinks with caffeine may cause irritation or frequency and should be used in moderation. To keep your urine flowing freely and avoid constipation, drink plenty of fluids during the day (8-10 glasses). Tip: Avoid cranberry juice because it is very acidic. °  °Activity:  °Your physical activity needs to be restricted somewhat.  We suggest that you reduce your activity under the circumstances until the bleeding has stopped. Heavy lifting (greater than 20 lbs.) and heavy exertion should be limited for 2-3 weeks. ° °Bowels:  °It is important to keep your bowels regular during the postoperative period.  Straining with bowel movements can cause bleeding. A bowel movement every other day is reasonable. Use a mild laxative if needed, such as milk of magnesia 2-3 tablespoons, or 2 Dulcolax tablets. Call if you continue to have problems. If you had been taking narcotics for pain, before, during or after your surgery, you may be constipated.  ° °Medication:  °You should resume your pre-surgery medications unless told not to. In addition you may be given an antibiotic to prevent or treat infection. Antibiotics are not always necessary. All medication should be taken as prescribed until the bottles are finished unless you are having an unusual reaction to one of the drugs.  °General Anesthetic, Adult  °A doctor specialized in giving anesthesia (anesthesiologist) or a nurse specialized in giving anesthesia (nurse anesthetist) gives medicine that makes you sleep while a procedure is performed (general anesthetic). Once the general anesthetic has been administered, you will be in a sleeplike state in which you feel no pain. After having a general anesthetic you may feel:  °Dizzy.  °Weak.  °Drowsy.  °Confused.  °These feelings are normal and can be expected to last for up to 24 hours after the procedure. ° ° °LET YOUR CAREGIVER KNOW ABOUT:  °Allergies you have.  °Medications you are taking, including herbs, eye drops, over the counter medications, dietary supplements, and creams.  °Previous problems you have had with anesthetics or numbing medicines.  °Use of cigarettes, alcohol, or illicit drugs.  °Possibility of pregnancy, if this applies.  °History of bleeding or blood disorders, including blood clots and clotting   disorders.  °Previous surgeries you have had and types of anesthetics you have received.  °Family medical history, especially anesthetic problems.  °Other health problems.  ° °AFTER THE PROCEDURE  °After surgery, you will be taken to the recovery area where a nurse will monitor your progress. You will be allowed  to go home when you are awake, stable, taking fluids well, and without serious pain or complications.  °For the first 24 hours following an anesthetic:  °Have a responsible person with you.  °Do not drive a car. If you are alone, do not take public transportation.  °Do not engage in strenuous activity. You may usually resume normal activities the next day, or as advised by your caregiver.  °Do not drink alcohol.  °Do not take medicine that has not been prescribed by your caregiver.  °Do not sign important papers or make important decisions as your judgement may be impaired.  °You may resume a normal diet as directed.  °Change bandages (dressings) as directed.  °Only take over-the-counter or prescription medicines for pain, discomfort, or fever as directed by your caregiver.  °If you have questions or problems that seem related to the anesthetic, call the hospital and ask for the anesthetist, anesthesiologist, or anesthesia department.  ° °SEEK IMMEDIATE MEDICAL CARE IF:  °You develop a rash.  °You have difficulty breathing.  °You have chest pain.  °You have allergic problems.  °You have uncontrolled nausea.  °You have uncontrolled vomiting.  °You develop any serious bleeding, especially from the incision site.  °Document Released: 08/23/2007 Document Revised: 01/26/2011 Document Reviewed: 09/16/2010  °Exit[employer]® Patient Information ©2012 [employer].  ° ° °

## 2014-06-16 NOTE — Transfer of Care (Signed)
Immediate Anesthesia Transfer of Care Note  Patient: Edward [name]  Procedure(s) Performed: Procedure(s): TRANSURETHRAL RESECTION OF BLADDER TUMOR (TURBT)  (N/A)  Patient Location: PACU  Anesthesia Type:General  Level of Consciousness: awake, alert , oriented and patient cooperative  Airway & Oxygen Therapy: Patient Spontanous Breathing and Patient connected to face mask oxygen  Post-op Assessment: Report given to PACU RN, Post -op Vital signs reviewed and stable and Patient moving all extremities X 4  Post vital signs: stable  Complications: No apparent anesthesia complications

## 2014-06-16 NOTE — Op Note (Signed)
Preoperative diagnosis: Papillary bladder cancers, greater than 5 cm   Postoperative diagnosis: Same  Procedure: Transurethral resection of bladder tumor, greater than 5 cm,  Surgeon: Jereme Loren  Anesthesia: Gen. with LMA  Specimen: Bladder tumor  Drain: 72 French Foley catheter, three-way  Indications: 81 year old male with cystoscopic evidence of multiple papillary bladder tumors. He underwent evaluation for gross hematuria in late 2015. Upper tracts were normal, he had quite a few papillary bladder tumors posterior and around the bladder neck. These are estimated to be over 5 cm in size. He presents at this time for TURBT and placement of mitomycin postoperatively.  Procedure:  The patient was properly identified in the holding area. He received preoperative IV antibiotics. He was taken the operating room where general anesthetic was administered with the LMA. He was placed in the dorsolithotomy position. Genitalia and perineum were prepped and draped. Proper timeout was performed.  A 22 French panendoscope was advanced into the bladder. Urethra was normal, prostatic urethra was nonobstructive. Bladder was entered and inspected circumferentially. 1+ trabeculations noted throughout the bladder. Neither ureteral orifice was identified, as they were obscured by papillary bladder tumors. The tumors were circumferential around the bladder neck, being found anteriorly, around the left and right bladder neck, and also across the trigone posteriorly. No lesions were seen in the upper bladder. I used a 6 [place] open-ended catheter, and attempted to cannulate both ureteral orifice he is both with the 12 and 70 lenses. I was unable to see either one for cannulation. The resection was carried out without being able to protect the ureters.  I then passed the resectoscope using the obturator. The resectoscope and the cutting loop were placed. I then began resection anteriorly at the bladder neck,  resecting what I felt was down into the muscularis layer. Care was taken to remove all anterior bladder tumors. There was a 2 cm bladder tumor in the midline trigonal region which was then resected. The bladder tumors in the right, midline and left lateral neck were then resected. Resection was carried down to the muscular layer. The bladder tumor fragments were then carefully irrigated from the bladder with the [facility] syringe. These were sent as "bladder tumor". I then inspected all resected sites. Cautery was used to carefully coagulate all small blood vessels. Careful inspection was then used to identify all resected sites. No bleeding was seen. At this point, more fragments were irrigated from the bladder with the syringe. These were sent with the main specimen, labeled "bladder tumor".  At this point, as hemostasis was excellent, the scope was removed. I then placed a 22 French Foley catheter, three-way. The balloon was filled with 30 mL of water. This was hooked to three-way irrigation. The irrigation ran clear. The irrigation was then stopped, and the drainage port plugged.  The patient was then taken to the PACU. He tolerated procedure well. In the PACU, 40 mL of diluent with 40 mg of mitomycin were placed in the bladder, and retained for 1 hour.

## 2014-06-16 NOTE — H&P (Signed)
Urology History and Physical Exam  CC: Bladder tumors  HPI: 81 year old male presents for management of bladder tumors.  He recently presented with gross hematuria requiring catheter management, and needed to be taken off of his anticoagulant. Evaluation included normal upper tracts on CT, but cysto in the office revelaed several papillary bladder tumors. He was taken off of his Eliquis, and the hematuria resolved. He presents now for TUR--BT as well as mitomycin.  PMH: Past Medical History  Diagnosis Date  . Gout     "only once in my lifetime" (12/18/2012)  . Depression   . PVD (peripheral vascular disease)     s/p B CEA  . COPD (chronic obstructive pulmonary disease)      PFT 6.19.07: FEV1 465  ratio 60 with 25% response to B2.  > PFTs 8.29.07: FEV1 61%  ratio 46% no better after B2.  > add on advair 12.20.11-improved 1.31.12  . AAA (abdominal aortic aneurysm)   . Melanosis coli   . Internal hemorrhoids   . Carotid artery occlusion   . Aortic valve disorder   . Chronic diastolic heart failure 41/10/6061  . CAD (coronary artery disease), native coronary artery     CABG w LIMA to LAD, SVG to dx, OM, RCA 1989 Dr. Arlyce Dice for 3VD PTCA of OM, 1999 and 2000 Cath showed occlusion of left main and RCA with stenosis in OM Redo redo CABG w SVG to OM, SVG to RCA8/10/00 Dr. Cyndia Bent   . Hyperlipidemia   . BPH (benign prostatic hypertrophy)   . Hypertensive heart disease     Change toprol to bisoprolol 10 mg daily on trial basis  05/21/2012  - [facility] non adherent 07/17/2012    . LBBB (left bundle branch block)   . Second degree heart block 12/18/2012    s/p MDT Adapta L pacemaker 12-20-2012 by Dr Caryl Comes  . Shingles years ago    mild  . PONV (postoperative nausea and vomiting) 1944    with ether  . Abrasion of left forearm dec 2015    small scraped area healing  . Chronic kidney disease stage III (GFR 30-59 ml/min)     saw dr Justin Mend oct 2015 and released by dr webb  . Congenital absence  of kidney     "noted during AAA repair; never knew it before" (12/18/2012)  . Cancer     bladder  . Gout years ago  . Myocardial infarction 1979  . Cough     coughing up green sputum last 4 to 5 days    PSH: Past Surgical History  Procedure Laterality Date  . Cholecystectomy  1998  . Hemiarthroplasty shoulder fracture Right ~ 2008     x 2 - [facility]  . Carotid endarterectomy Bilateral 1990's  . Abdominal aortic aneurysm repair  1998  . Carotid-subclavian bypass graft Left 1990's  . Anal fissure repair      12/18/2012 "I don't remember this"  . Hemorroidectomy      12/18/2012 "I don't remember this"  . Carpal tunnel release Right 1980's    "Dr. Marrian Salvage" (12/18/2012)  . Cardiac catheterization      "2 or 3" (12/18/2012)  . Coronary angioplasty      "a few" (12/18/2012)  . Coronary angioplasty with stent placement      "I've got 1 or 2" (12/18/2012)  . Hip fracture surgery  1944    "fell out of a tree; had it operated on 3 times" (12/18/2012)  .  Refractive surgery Bilateral 1990's?  . Pacemaker insertion  12-20-2012    dual chamber Medtronic Adapta L pacemaker implanted by Dr Caryl Comes  . Cardioversion N/A 12/30/2013    Procedure: CARDIOVERSION;  Surgeon: Jacolyn Reedy, MD;  Location: [place];  Service: Cardiovascular;  Laterality: N/A;  . Permanent pacemaker insertion N/A 12/20/2012    Procedure: PERMANENT PACEMAKER INSERTION;  Surgeon: Deboraha Sprang, MD;  Location: [facility] C[facility];  Service: Cardiovascular;  Laterality: N/A;  . Coronary artery bypass graft  1989, 2000     "CABG X ?3; CABG X 5 w//Bartle" (12/18/2012)    Allergies: Allergies  Allergen Reactions  . Ace Inhibitors Cough  . Clarithromycin Other (See Comments)    Strange thoughts and fell with biaxin  . Codeine Nausea And Vomiting  . Oxycodone-Acetaminophen Other (See Comments)    Felt closed in    Medications: No prescriptions prior to admission     Social History: History   Social History  .  Marital Status: Widowed    Spouse Name: N/A    Number of Children: N/A  . Years of Education: N/A   Occupational History  . retired from [employer]  . Smoking status: Former Smoker -- 1.50 packs/day for 50 years    Types: Cigarettes    Quit date: 05/30/1996  . Smokeless tobacco: Never Used  . Alcohol Use: 1.8 oz/week    3 Cans of beer per week     Comment: beer few times per week  . Drug Use: No  . Sexual Activity: No   Other Topics Concern  . Not on file   Social History Narrative   Lives alone. Widower.    Family History: Family History  Problem Relation Age of Onset  . Heart disease Mother   . Colon cancer Neg Hx     Review of Systems: Positive: Hematuria Negative:  A further 10 point review of systems was negative except what is listed in the HPI.                  Physical Exam: @VITALS2 @ General: No acute distress.  Awake. Head:  Normocephalic.  Atraumatic. ENT:  EOMI.  Mucous membranes moist Neck:  Supple.  No lymphadenopathy. CV:  S1 present. S2 present. Regular rate. Pulmonary: Equal effort bilaterally.  Clear to auscultation bilaterally. Abdomen: Soft.  Non tender to palpation. Skin:  Normal turgor.  No visible rash. Extremity: No gross deformity of bilateral upper extremities.  No gross deformity of                             lower extremities. Neurologic: Alert. Appropriate mood.    Studies:  No results for input(s): HGB, WBC, PLT in the last 72 hours.  No results for input(s): NA, K, CL, CO2, BUN, CREATININE, CALCIUM, GFRNONAA, GFRAA in the last 72 hours.  Invalid input(s): MAGNESIUM   No results for input(s): INR, APTT in the last 72 hours.  Invalid input(s): PT   Invalid input(s): ABG    Assessment:  Papillary bladder tumors  Plan: Tur-BT followed by placement of mitomycin

## 2014-06-16 NOTE — Anesthesia Postprocedure Evaluation (Signed)
  Anesthesia Post-op Note  Patient: Edward [name]  Procedure(s) Performed: Procedure(s) (LRB): TRANSURETHRAL RESECTION OF BLADDER TUMOR (TURBT)  (N/A)  Patient Location: PACU  Anesthesia Type: General  Level of Consciousness: awake and alert   Airway and Oxygen Therapy: Patient Spontanous Breathing  Post-op Pain: mild  Post-op Assessment: Post-op Vital signs reviewed, Patient's Cardiovascular Status Stable, Respiratory Function Stable, Patent Airway and No signs of Nausea or vomiting  Last Vitals:  Filed Vitals:   06/16/14 1100  BP: 149/73  Pulse: 70  Temp:   Resp: 16    Post-op Vital Signs: stable   Complications: No apparent anesthesia complications

## 2014-06-16 NOTE — Anesthesia Preprocedure Evaluation (Addendum)
Anesthesia Evaluation  Patient identified by MRN, date of birth, ID band Patient awake    Reviewed: Allergy & Precautions, NPO status , Patient's Chart, lab work & pertinent test results  Airway Mallampati: II  TM Distance: >3 FB Neck ROM: Full    Dental no notable dental hx.    Pulmonary COPDformer smoker,  breath sounds clear to auscultation  Pulmonary exam normal       Cardiovascular + CAD, + Past MI, + CABG and + Peripheral Vascular Disease + dysrhythmias Atrial Fibrillation + pacemaker Rhythm:Regular Rate:Normal     Neuro/Psych negative neurological ROS  negative psych ROS   GI/Hepatic negative GI ROS, Neg liver ROS,   Endo/Other  negative endocrine ROS  Renal/GU Renal InsufficiencyRenal disease  negative genitourinary   Musculoskeletal negative musculoskeletal ROS (+)   Abdominal   Peds negative pediatric ROS (+)  Hematology negative hematology ROS (+)   Anesthesia Other Findings   Reproductive/Obstetrics negative OB ROS                            Anesthesia Physical Anesthesia Plan  ASA: III  Anesthesia Plan: General   Post-op Pain Management:    Induction: Intravenous  Airway Management Planned: LMA  Additional Equipment:   Intra-op Plan:   Post-operative Plan: Extubation in OR  Informed Consent: I have reviewed the patients History and Physical, chart, labs and discussed the procedure including the risks, benefits and alternatives for the proposed anesthesia with the patient or authorized representative who has indicated his/her understanding and acceptance.   Dental advisory given  Plan Discussed with: CRNA and Surgeon  Anesthesia Plan Comments:        Anesthesia Quick Evaluation

## 2014-06-16 NOTE — Progress Notes (Signed)
Post-op note  Subjective: The patient is doing well.  His only complaint is catheter discomfort  Objective: Vital signs in last 24 hours: Temp:  [98.2 F (36.8 C)-98.6 F (37 C)] 98.3 F (36.8 C) (01/18 1225) Pulse Rate:  [69-86] 70 (01/18 1245) Resp:  [14-17] 16 (01/18 1245) BP: (127-149)/(56-75) 138/68 mmHg (01/18 1245) SpO2:  [94 %-100 %] 99 % (01/18 1245) Weight:  [76.658 kg (169 lb)] 76.658 kg (169 lb) (01/18 0800)  Intake/Output from previous day:   Intake/Output this shift: Total I/O In: 1610 [I.V.:1025; Other:5700] Out: 8240 [Urine:8240]  Physical Exam:  General: Alert and oriented.  Urine irrigant buried light pink, without clots  Lab Results: No results for input(s): HGB, HCT in the last 72 hours.  Assessment/Plan: POD#0   1) Continue to monitor 2) if doing well, will discontinue Foley catheter in the morning followed by discharge  Edward [name]. Edward Belflower, MD   LOS: 0 days   Jorja Loa 06/16/2014, 6:38 PM

## 2014-06-16 NOTE — Progress Notes (Signed)
INITIAL NUTRITION ASSESSMENT  DOCUMENTATION CODES Per approved criteria  -Severe malnutrition in the context of chronic illness  Pt meets criteria for severe MALNUTRITION in the context of chronic illness as evidenced by 9% weight loss x 2 months and energy intake <75% for >/=1 month.  INTERVENTION: -Continue Ensure Complete po BID, each supplement provides 350 kcal and 13 grams of protein -Encouraged PO intake -RD to continue to monitor  NUTRITION DIAGNOSIS: Inadequate oral intake related to poor appetite as evidenced by poor PO intake x 2 months per pt and family.   Goal: Pt to meet >/= 90% of their estimated nutrition needs   Monitor:  PO and supplemental intake, weight, labs, I/O's  Reason for Assessment: Pt identified as at nutrition risk on the Malnutrition Screen Tool  Admitting Dx: bladder cancer  ASSESSMENT: 81 year old male presents for management of bladder tumors. S/p 1/18 Transurethral resection of bladder tumor  Pt and family reports pt had poor appetite PTA which started around November 2015. Per weight history documentation, pt has lost 16 lb since November (9% weight loss x 2 months, significant for time frame).  During visit, pt had tried a couple of sips of soup and was drinking his Ensure supplement. Pt is eating slowly d/t surgery this AM. Per family, pt consumes Boost supplements at home, however pt is not drinking them daily. Encouraged pt to continue to consume Boost supplements daily after discharge to prevent further weight loss.  Labs reviewed: no labs yet  Height: Ht Readings from Last 1 Encounters:  06/16/14 5\' 10"  (1.778 m)    Weight: Wt Readings from Last 1 Encounters:  06/16/14 169 lb (76.658 kg)    Ideal Body Weight: 166 lb  % Ideal Body Weight: 102%  Wt Readings from Last 10 Encounters:  06/16/14 169 lb (76.658 kg)  06/11/14 169 lb 9.6 oz (76.93 kg)  06/06/14 171 lb 4.8 oz (77.701 kg)  06/03/14 174 lb (78.926 kg)  05/05/14 175 lb  (79.379 kg)  05/01/14 175 lb 4 oz (79.493 kg)  04/05/14 185 lb (83.915 kg)  12/30/13 191 lb (86.637 kg)  12/24/13 192 lb (87.091 kg)  12/10/13 191 lb 6.4 oz (86.818 kg)    Usual Body Weight: 191 lb -per pt  % Usual Body Weight: 88%  BMI:  Body mass index is 24.25 kg/(m^2).  Estimated Nutritional Needs: Kcal: 9509-3267 Protein: 90-100g Fluid: 2.3L/day  Skin: intact  Diet Order: Diet regular  EDUCATION NEEDS: -No education needs identified at this time   Intake/Output Summary (Last 24 hours) at 06/16/14 1436 Last data filed at 06/16/14 1414  Gross per 24 hour  Intake   4900 ml  Output   3200 ml  Net   1700 ml    Last BM: 1/17  Labs:  No results for input(s): NA, K, CL, CO2, BUN, CREATININE, CALCIUM, MG, PHOS, GLUCOSE in the last 168 hours.  CBG (last 3)  No results for input(s): GLUCAP in the last 72 hours.  Scheduled Meds: . [START ON 06/17/2014] amiodarone  200 mg Oral Daily  . cephALEXin  500 mg Oral Q12H  . feeding supplement (ENSURE COMPLETE)  237 mL Oral BID BM  . fentaNYL      . mometasone-formoterol  2 puff Inhalation Q12H  . polyethylene glycol  17 g Oral QPC supper  . [START ON 06/17/2014] rosuvastatin  20 mg Oral Daily  . tamsulosin  0.4 mg Oral QHS  . temazepam  15 mg Oral QHS  . [START  ON 06/17/2014] tiotropium  18 mcg Inhalation Daily    Continuous Infusions: . sodium chloride 75 mL/hr at 06/16/14 1320  . sodium chloride irrigation      Past Medical History  Diagnosis Date  . Gout     "only once in my lifetime" (12/18/2012)  . Depression   . PVD (peripheral vascular disease)     s/p B CEA  . COPD (chronic obstructive pulmonary disease)      PFT 6.19.07: FEV1 465  ratio 60 with 25% response to B2.  > PFTs 8.29.07: FEV1 61%  ratio 46% no better after B2.  > add on advair 12.20.11-improved 1.31.12  . AAA (abdominal aortic aneurysm)   . Melanosis coli   . Internal hemorrhoids   . Carotid artery occlusion   . Aortic valve disorder   .  Chronic diastolic heart failure 29/01/2425  . CAD (coronary artery disease), native coronary artery     CABG w LIMA to LAD, SVG to dx, OM, RCA 1989 Dr. Arlyce Dice for 3VD PTCA of OM, 1999 and 2000 Cath showed occlusion of left main and RCA with stenosis in OM Redo redo CABG w SVG to OM, SVG to RCA8/10/00 Dr. Cyndia Bent   . Hyperlipidemia   . BPH (benign prostatic hypertrophy)   . Hypertensive heart disease     Change toprol to bisoprolol 10 mg daily on trial basis  05/21/2012  - notified by [facility] non adherent 07/17/2012    . LBBB (left bundle branch block)   . Second degree heart block 12/18/2012    s/p MDT Adapta L pacemaker 12-20-2012 by Dr Caryl Comes  . Shingles years ago    mild  . PONV (postoperative nausea and vomiting) 1944    with ether  . Abrasion of left forearm dec 2015    small scraped area healing  . Chronic kidney disease stage III (GFR 30-59 ml/min)     saw dr Justin Mend oct 2015 and released by dr webb  . Congenital absence of kidney     "noted during AAA repair; never knew it before" (12/18/2012)  . Cancer     bladder  . Gout years ago  . Myocardial infarction 1979  . Cough     coughing up green sputum last 4 to 5 days    Past Surgical History  Procedure Laterality Date  . Cholecystectomy  1998  . Hemiarthroplasty shoulder fracture Right ~ 2008     x 2 - [facility]  . Carotid endarterectomy Bilateral 1990's  . Abdominal aortic aneurysm repair  1998  . Carotid-subclavian bypass graft Left 1990's  . Anal fissure repair      12/18/2012 "I don't remember this"  . Hemorroidectomy      12/18/2012 "I don't remember this"  . Carpal tunnel release Right 1980's    "Dr. Marrian Salvage" (12/18/2012)  . Cardiac catheterization      "2 or 3" (12/18/2012)  . Coronary angioplasty      "a few" (12/18/2012)  . Coronary angioplasty with stent placement      "I've got 1 or 2" (12/18/2012)  . Hip fracture surgery  1944    "fell out of a tree; had it operated on 3 times" (12/18/2012)  . Refractive surgery  Bilateral 1990's?  . Pacemaker insertion  12-20-2012    dual chamber Medtronic Adapta L pacemaker implanted by Dr Caryl Comes  . Cardioversion N/A 12/30/2013    Procedure: CARDIOVERSION;  Surgeon: Jacolyn Reedy, MD;  Location: [place];  Service: Cardiovascular;  Laterality:  N/A;  . Permanent pacemaker insertion N/A 12/20/2012    Procedure: PERMANENT PACEMAKER INSERTION;  Surgeon: Deboraha Sprang, MD;  Location: [facility] CATH LAB;  Service: Cardiovascular;  Laterality: N/A;  . Coronary artery bypass graft  1989, 2000     "CABG X ?3; CABG X 5 w//Bartle" (12/18/2012)    Clayton Bibles, MS, RD, LDN Pager: [phone] After Hours Pager: [phone]

## 2014-06-17 ENCOUNTER — Encounter (HOSPITAL_COMMUNITY): Payer: Self-pay | Admitting: Urology

## 2014-06-17 MED ORDER — ACETAMINOPHEN 325 MG PO TABS: 650.0000 mg | ORAL_TABLET | Freq: Four times a day (QID) | ORAL | Status: DC | PRN

## 2014-06-17 NOTE — Progress Notes (Signed)
Report received from T. Nils Pyle, Therapist, sports. Patient's son with concerns of patient's AMS, tremors, and discomfort. Urology prn orders activated. Will continue to monitor patient and f/u as needed.

## 2014-06-17 NOTE — Progress Notes (Signed)
Pt has not been able to void since foley d/c. Bladder scan only shows 100cc at this time. Encouraged pt to drink lots of fluids. Will continue to monitor

## 2014-06-17 NOTE — Progress Notes (Signed)
Pt's son had concerns that the pt was jittery and this was new for the pt. Explain to son and pt about the effects of anesthesia wearing off. Checked CBI and foley, it was draining appropriately . Checked pt's blood sugar it was 95, temp 99.5 and pt c/o of pain of 3/10 in the genital region. Gave pt a vicodin and an ensure. Encouraged pt to drink fluids and told them I will check back within an hour. 45 mins later, the son comes out and says the pt is slightly confused, still has tremors, and trying to get out bed because he has to pee. Checked on pt, pt said he felt like he had to go to the bathroom.  Explained to pt that he has a foley. Pt insisted on going to the bathroom, he agreed to sit on the [facility]. Felt pt's forehead he was warm, temp was 100.9.  Tylenol and a B&O suppository was given. Pt is back in bed, will continue to monitor.

## 2014-06-17 NOTE — Progress Notes (Signed)
Pt feeling the urge to void now, but can't.  Scan shows 390cc.  Pt does not want another foley, Md paged to be made aware.

## 2014-06-17 NOTE — Progress Notes (Signed)
Dr Dorina Hoyer aware of pt situation, see new orders.  In and out cath done.  420cc brownish urine returned with one very small clot noted. Will continue to monitor pts output

## 2014-06-17 NOTE — Progress Notes (Signed)
Slept 5-6 hours last night and feels better this AM. Catheter was removed at 5am. Hasn't voided yet this AM. No complaints from patient.  Filed Vitals:   06/16/14 2045 06/16/14 2204 06/16/14 2330 06/17/14 0502  BP:  143/70  121/52  Pulse:  93  78  Temp:  99.8 F (37.7 C) 100.9 F (38.3 C) 98.2 F (36.8 C)  TempSrc:   Oral Axillary  Resp:  20  20  Height:      Weight:      SpO2: 94% 98%  98%    Intake/Output Summary (Last 24 hours) at 06/17/14 0803 Last data filed at 06/17/14 0700  Gross per 24 hour  Intake  20625 ml  Output  21120 ml  Net   -495 ml  NAD Abdomen is soft  No results for input(s): WBC, HGB, HCT in the last 72 hours. No results for input(s): NA, K, CL, CO2, GLUCOSE, BUN, CREATININE, CALCIUM in the last 72 hours.  Recent Labs  06/16/14 0835  INR 2.12*   No results for input(s): LABURIN in the last 72 hours. Results for orders placed or performed during the hospital encounter of 04/05/14  Urine culture     Status: None   Collection Time: 04/05/14  9:21 PM  Result Value Ref Range Status   Specimen Description URINE, CATHETERIZED  Final   Special Requests NONE  Final   Culture  Setup Time   Final    04/06/2014 16:58 Performed at [facility] Performed at [facility]   Final   Culture NO GROWTH Performed at [facility]   Final   Report Status 04/07/2014 FINAL  Final     Imp: s/p TURBT POD#1, doing well.  Needs to void prior to discharge. Plan: D/c home this AM with abx x 3 days. F/u as scheduled with Dr. Diona Fanti.

## 2014-06-17 NOTE — Progress Notes (Signed)
UR completed 

## 2014-06-18 ENCOUNTER — Observation Stay (HOSPITAL_COMMUNITY): Payer: Medicare Other

## 2014-06-18 DIAGNOSIS — C679 Malignant neoplasm of bladder, unspecified: Secondary | ICD-10-CM

## 2014-06-18 LAB — URINALYSIS, ROUTINE W REFLEX MICROSCOPIC
GLUCOSE, UA: NEGATIVE mg/dL
Ketones, ur: NEGATIVE mg/dL
Nitrite: NEGATIVE
Protein, ur: 100 mg/dL — AB
SPECIFIC GRAVITY, URINE: 1.018 (ref 1.005–1.030)
UROBILINOGEN UA: 1 mg/dL (ref 0.0–1.0)
pH: 5.5 (ref 5.0–8.0)

## 2014-06-18 LAB — COMPREHENSIVE METABOLIC PANEL
ALBUMIN: 2.6 g/dL — AB (ref 3.5–5.2)
ALK PHOS: 92 U/L (ref 39–117)
ALT: 17 U/L (ref 0–53)
ANION GAP: 7 (ref 5–15)
AST: 34 U/L (ref 0–37)
BUN: 41 mg/dL — ABNORMAL HIGH (ref 6–23)
CALCIUM: 8.2 mg/dL — AB (ref 8.4–10.5)
CO2: 24 mmol/L (ref 19–32)
Chloride: 98 mEq/L (ref 96–112)
Creatinine, Ser: 2.17 mg/dL — ABNORMAL HIGH (ref 0.50–1.35)
GFR calc Af Amer: 31 mL/min — ABNORMAL LOW (ref >90)
GFR calc non Af Amer: 27 mL/min — ABNORMAL LOW (ref >90)
GLUCOSE: 119 mg/dL — AB (ref 70–99)
POTASSIUM: 4.4 mmol/L (ref 3.5–5.1)
SODIUM: 129 mmol/L — AB (ref 135–145)
Total Bilirubin: 1.1 mg/dL (ref 0.3–1.2)
Total Protein: 5.4 g/dL — ABNORMAL LOW (ref 6.0–8.3)

## 2014-06-18 LAB — TROPONIN I
Troponin I: 0.05 ng/mL — ABNORMAL HIGH (ref <0.031)
Troponin I: 0.04 ng/mL — ABNORMAL HIGH (ref <0.031)

## 2014-06-18 LAB — CBC
HEMATOCRIT: 29.6 % — AB (ref 39.0–52.0)
Hemoglobin: 9.6 g/dL — ABNORMAL LOW (ref 13.0–17.0)
MCH: 30.1 pg (ref 26.0–34.0)
MCHC: 32.4 g/dL (ref 30.0–36.0)
MCV: 92.8 fL (ref 78.0–100.0)
PLATELETS: 194 10*3/uL (ref 150–400)
RBC: 3.19 MIL/uL — AB (ref 4.22–5.81)
RDW: 13.9 % (ref 11.5–15.5)
WBC: 10.7 10*3/uL — AB (ref 4.0–10.5)

## 2014-06-18 LAB — URINE MICROSCOPIC-ADD ON

## 2014-06-18 LAB — PROTIME-INR
INR: 1.91 — ABNORMAL HIGH (ref 0.00–1.49)
PROTHROMBIN TIME: 22 s — AB (ref 11.6–15.2)

## 2014-06-18 MED ORDER — HYDROMORPHONE HCL 1 MG/ML IJ SOLN: 0.5000 mg | INTRAMUSCULAR | Status: DC | PRN

## 2014-06-18 MED ORDER — FUROSEMIDE 10 MG/ML IJ SOLN: 20.0000 mg | Freq: Once | INTRAMUSCULAR | Status: DC

## 2014-06-18 MED ORDER — PIPERACILLIN-TAZOBACTAM 3.375 G IVPB
3.3750 g | Freq: Three times a day (TID) | INTRAVENOUS | Status: DC
  Administered 2014-06-18 – 2014-06-22 (×11): 3.375 g via INTRAVENOUS
  Filled 2014-06-18 (×12): qty 50

## 2014-06-18 MED ORDER — LEVALBUTEROL HCL 1.25 MG/0.5ML IN NEBU
1.2500 mg | INHALATION_SOLUTION | Freq: Three times a day (TID) | RESPIRATORY_TRACT | Status: DC
  Administered 2014-06-18 – 2014-06-21 (×10): 1.25 mg via RESPIRATORY_TRACT
  Filled 2014-06-18 (×15): qty 0.5

## 2014-06-18 MED ORDER — PROMETHAZINE HCL 25 MG/ML IJ SOLN: 12.5000 mg | Freq: Four times a day (QID) | INTRAMUSCULAR | Status: DC | PRN

## 2014-06-18 MED ORDER — SODIUM CHLORIDE 0.9 % IV SOLN
1250.0000 mg | Freq: Once | INTRAVENOUS | Status: AC
  Administered 2014-06-18: 1250 mg via INTRAVENOUS
  Filled 2014-06-18: qty 1250

## 2014-06-18 MED ORDER — SODIUM CHLORIDE 0.9 % IV SOLN
INTRAVENOUS | Status: DC
  Administered 2014-06-18 – 2014-06-22 (×5): via INTRAVENOUS

## 2014-06-18 MED ORDER — SODIUM CHLORIDE 0.9 % IV BOLUS (SEPSIS)
250.0000 mL | Freq: Once | INTRAVENOUS | Status: AC
  Administered 2014-06-18: 250 mL via INTRAVENOUS

## 2014-06-18 MED ORDER — VANCOMYCIN HCL IN DEXTROSE 750-5 MG/150ML-% IV SOLN
750.0000 mg | INTRAVENOUS | Status: DC
  Administered 2014-06-19 – 2014-06-21 (×3): 750 mg via INTRAVENOUS
  Filled 2014-06-18 (×3): qty 150

## 2014-06-18 MED ORDER — METOCLOPRAMIDE HCL 5 MG/ML IJ SOLN
5.0000 mg | Freq: Three times a day (TID) | INTRAMUSCULAR | Status: DC
  Administered 2014-06-18 – 2014-06-19 (×2): 5 mg via INTRAVENOUS
  Filled 2014-06-18: qty 2
  Filled 2014-06-18: qty 1
  Filled 2014-06-18: qty 2
  Filled 2014-06-18 (×2): qty 1

## 2014-06-18 MED ORDER — SODIUM CHLORIDE 0.9 % IV BOLUS (SEPSIS)
250.0000 mL | Freq: Four times a day (QID) | INTRAVENOUS | Status: DC | PRN
  Administered 2014-06-18 – 2014-06-19 (×2): 250 mL via INTRAVENOUS
  Filled 2014-06-18 (×2): qty 250

## 2014-06-18 MED ORDER — VITAMINS A & D EX OINT
TOPICAL_OINTMENT | CUTANEOUS | Status: AC
  Administered 2014-06-18: 5
  Filled 2014-06-18: qty 5

## 2014-06-18 NOTE — Progress Notes (Signed)
Resumed antibiotics.

## 2014-06-18 NOTE — H&P (Addendum)
[facility] History and Physical  Edward [name] KKX:[id number] DOB: 12-05-1929 DOA: 06/16/2014  Referring physician:  PCP: Gwendolyn Grant, MD   Chief Complaint: *Medical management  HPI:  81 year old male with a history of COPD, coronary artery disease, carotid artery disease, dyslipidemia, status post pacemaker placement, status post s/p TURBT   , being evaluated by medicine today for medical management. Patient is on Coumadin for history of atrial fibrillation. INR  Is slightly below 2 on  2 days postop. The patient has been holding his Coumadin for 4 days prior to his urologic procedure. He did have postop hematuria which is now resolved. He has had nausea and vomiting 2 today. Per RN his abdomen appears to be distended. The patient has also been hypotensive with systolic blood pressure in the 90s and has received 250 mL bolus this morning.  patient has been afebrile , found to be hypoxic the oxygen saturation in the 70s , currently is 93% on 4 L of oxygen.   subjectively the patient denies any  Chest pain or shortness of breath, has had a nonproductive cough prior to admission. Denies any recent history of stress test. As per our records patient's last known EF in 2014 was day to 55%   Review of Systems: negative for the following  Constitutional: Denies fever, chills, diaphoresis, appetite change and fatigue.  HEENT: Denies photophobia, eye pain, redness, hearing loss, ear pain, congestion, sore throat, rhinorrhea, sneezing, mouth sores, trouble swallowing, neck pain, neck stiffness and tinnitus.  Respiratory: Denies SOB, DOE, cough, chest tightness, and wheezing.  Cardiovascular: Denies chest pain, palpitations and leg swelling.  Gastrointestinal: Denies nausea, vomiting, abdominal pain, diarrhea, constipation, blood in stool and abdominal distention.  Genitourinary: Denies dysuria, urgency, frequency, hematuria, flank pain and difficulty urinating.  Musculoskeletal: Denies  myalgias, back pain, joint swelling, arthralgias and gait problem.  Skin: Denies pallor, rash and wound.  Neurological: Denies dizziness, seizures, syncope, weakness, light-headedness, numbness and headaches.  Hematological: Denies adenopathy. Easy bruising, personal or family bleeding history  Psychiatric/Behavioral: Denies suicidal ideation, mood changes, confusion, nervousness, sleep disturbance and agitation       Past Medical History  Diagnosis Date  . Gout     "only once in my lifetime" (12/18/2012)  . Depression   . PVD (peripheral vascular disease)     s/p B CEA  . COPD (chronic obstructive pulmonary disease)      PFT 6.19.07: FEV1 465  ratio 60 with 25% response to B2.  > PFTs 8.29.07: FEV1 61%  ratio 46% no better after B2.  > add on advair 12.20.11-improved 1.31.12  . AAA (abdominal aortic aneurysm)   . Melanosis coli   . Internal hemorrhoids   . Carotid artery occlusion   . Aortic valve disorder   . Chronic diastolic heart failure 16/01/6788  . CAD (coronary artery disease), native coronary artery     CABG w LIMA to LAD, SVG to dx, OM, RCA 1989 Dr. Arlyce Dice for 3VD PTCA of OM, 1999 and 2000 Cath showed occlusion of left main and RCA with stenosis in OM Redo redo CABG w SVG to OM, SVG to RCA8/10/00 Dr. Cyndia Bent   . Hyperlipidemia   . BPH (benign prostatic hypertrophy)   . Hypertensive heart disease     Change toprol to bisoprolol 10 mg daily on trial basis  05/21/2012  - notified by [facility] non adherent 07/17/2012    . LBBB (left bundle branch block)   . Second degree heart block 12/18/2012  s/p MDT Adapta L pacemaker 12-20-2012 by Dr Caryl Comes  . Shingles years ago    mild  . PONV (postoperative nausea and vomiting) 1944    with ether  . Abrasion of left forearm dec 2015    small scraped area healing  . Chronic kidney disease stage III (GFR 30-59 ml/min)     saw dr Justin Mend oct 2015 and released by dr webb  . Congenital absence of kidney     "noted during AAA repair; never knew it  before" (12/18/2012)  . Cancer     bladder  . Gout years ago  . Myocardial infarction 1979  . Cough     coughing up green sputum last 4 to 5 days     Past Surgical History  Procedure Laterality Date  . Cholecystectomy  1998  . Hemiarthroplasty shoulder fracture Right ~ 2008     x 2 - [place]  . Carotid endarterectomy Bilateral 1990's  . Abdominal aortic aneurysm repair  1998  . Carotid-subclavian bypass graft Left 1990's  . Anal fissure repair      12/18/2012 "I don't remember this"  . Hemorroidectomy      12/18/2012 "I don't remember this"  . Carpal tunnel release Right 1980's    "Dr. Marrian Salvage" (12/18/2012)  . Cardiac catheterization      "2 or 3" (12/18/2012)  . Coronary angioplasty      "a few" (12/18/2012)  . Coronary angioplasty with stent placement      "I've got 1 or 2" (12/18/2012)  . Hip fracture surgery  1944    "fell out of a tree; had it operated on 3 times" (12/18/2012)  . Refractive surgery Bilateral 1990's?  . Pacemaker insertion  12-20-2012    dual chamber Medtronic Adapta L pacemaker implanted by Dr Caryl Comes  . Cardioversion N/A 12/30/2013    Procedure: CARDIOVERSION;  Surgeon: Jacolyn Reedy, MD;  Location: [facility];  Service: Cardiovascular;  Laterality: N/A;  . Permanent pacemaker insertion N/A 12/20/2012    Procedure: PERMANENT PACEMAKER INSERTION;  Surgeon: Deboraha Sprang, MD;  Location: [facility] CATH LAB;  Service: Cardiovascular;  Laterality: N/A;  . Coronary artery bypass graft  1989, 2000     "CABG X ?3; CABG X 5 w//Bartle" (12/18/2012)  . Transurethral resection of bladder tumor N/A 06/16/2014    Procedure: TRANSURETHRAL RESECTION OF BLADDER TUMOR (TURBT) ;  Surgeon: Jorja Loa, MD;  Location: [facility];  Service: Urology;  Laterality: N/A;      Social History:  reports that he quit smoking about 18 years ago. His smoking use included Cigarettes. He has a 75 pack-year smoking history. He has never used smokeless tobacco. He reports that he drinks  about 1.8 oz of alcohol per week. He reports that he does not use illicit drugs.    Allergies  Allergen Reactions  . Ace Inhibitors Cough  . Clarithromycin Other (See Comments)    Strange thoughts and fell with biaxin  . Codeine Nausea And Vomiting  . Oxycodone-Acetaminophen Other (See Comments)    Felt closed in    Family History  Problem Relation Age of Onset  . Heart disease Mother   . Colon cancer Neg Hx      Prior to Admission medications   Medication Sig Start Date End Date Taking? Authorizing Provider  amiodarone (PACERONE) 200 MG tablet Take 200 mg by mouth daily.   Yes Historical Provider, MD  colchicine 0.6 MG tablet Take 0.6 mg by mouth daily as needed (gout  flare ups).   Yes Historical Provider, MD  furosemide (LASIX) 20 MG tablet Take 20 mg by mouth daily as needed for fluid or edema (leg swelling).   Yes Historical Provider, MD  guaiFENesin (MUCINEX) 600 MG 12 hr tablet Take by mouth as needed.   Yes Historical Provider, MD  mometasone-formoterol (DULERA) 200-5 MCG/ACT AERO Inhale 2 puffs into the lungs every 12 (twelve) hours. 03/12/14  Yes Tanda Rockers, MD  nitroGLYCERIN (NITROSTAT) 0.4 MG SL tablet Place 0.4 mg under the tongue every 5 (five) minutes as needed for chest pain.   Yes Historical Provider, MD  oxybutynin (DITROPAN) 5 MG tablet Take 5 mg by mouth every 8 (eight) hours as needed for bladder spasms (bladder spasms).    Yes Historical Provider, MD  Polyethylene Glycol 3350 (MIRALAX PO) Take 1 scoop by mouth daily after supper.   Yes Historical Provider, MD  rosuvastatin (CRESTOR) 20 MG tablet Take 20 mg by mouth daily.     Yes Historical Provider, MD  tamsulosin (FLOMAX) 0.4 MG CAPS capsule Take 1 capsule (0.4 mg total) by mouth at bedtime. 01/24/14  Yes Rowe Clack, MD  temazepam (RESTORIL) 15 MG capsule Take 1 capsule (15 mg total) by mouth at bedtime. 05/01/14  Yes Rowe Clack, MD  tiotropium (SPIRIVA) 18 MCG inhalation capsule Place 1 capsule  (18 mcg total) into inhaler and inhale daily. 03/12/14 03/12/15 Yes Tanda Rockers, MD  warfarin (COUMADIN) 1 MG tablet Take 1 mg by mouth daily.   Yes Historical Provider, MD  acetaminophen (TYLENOL) 325 MG tablet Take 2 tablets (650 mg total) by mouth every 6 (six) hours as needed for fever. 06/17/14   Ardis Hughs, MD  cephALEXin (KEFLEX) 250 MG capsule Take 1 capsule (250 mg total) by mouth 2 (two) times daily. 06/16/14   Jorja Loa, MD  HYDROcodone-acetaminophen (NORCO) 5-325 MG per tablet Take 1-2 tablets by mouth every 6 (six) hours as needed for moderate pain. 05/01/14   Rowe Clack, MD     Physical Exam: Filed Vitals:   06/18/14 0931 06/18/14 1324 06/18/14 1645 06/18/14 1724  BP:  99/52 100/51   Pulse:  87 86   Temp:  98.6 F (37 C)    TempSrc:  Oral    Resp:  16 18   Height:      Weight:      SpO2: 94% 93% 96% 99%     Constitutional: Vital signs reviewed. Patient is a well-developed and well-nourished in no acute distress and cooperative with exam. Alert and oriented x3.  Head: Normocephalic and atraumatic  Ear: TM normal bilaterally  Mouth: no erythema or exudates, MMM  Eyes: PERRL, EOMI, conjunctivae normal, No scleral icterus.  Neck: Supple, Trachea midline normal ROM, No JVD, mass, thyromegaly, or carotid bruit present.  Cardiovascular: RRR, S1 normal, S2 normal, no MRG, pulses symmetric and intact bilaterally  Pulmonary/Chest:  Decreased breath sounds at the left base, no wheezes, rales, or rhonchi  Abdominal: Soft. Non-tender, non-distended, bowel sounds are normal, no masses, organomegaly, or guarding present.  GU: no CVA tenderness Musculoskeletal: No joint deformities, erythema, or stiffness, ROM full and no nontender Ext: no edema and no cyanosis, pulses palpable bilaterally (DP and PT)  Hematology: no cervical, inginal, or axillary adenopathy.  Neurological: A&O x3, Strenght is normal and symmetric bilaterally, cranial nerve II-XII are  grossly intact, no focal motor deficit, sensory intact to light touch bilaterally.  Skin: Warm, dry and intact. No rash, cyanosis, or  clubbing.  Psychiatric: Normal mood and affect. speech and behavior is normal. Judgment and thought content normal. Cognition and memory are normal.       Labs on Admission:    Basic Metabolic Panel:  Recent Labs Lab 06/18/14 1708  NA 129*  K 4.4  CL 98  CO2 24  GLUCOSE 119*  BUN 41*  CREATININE 2.17*  CALCIUM 8.2*   Liver Function Tests:  Recent Labs Lab 06/18/14 1708  AST 34  ALT 17  ALKPHOS 92  BILITOT 1.1  PROT 5.4*  ALBUMIN 2.6*   No results for input(s): LIPASE, AMYLASE in the last 168 hours. No results for input(s): AMMONIA in the last 168 hours. CBC:  Recent Labs Lab 06/18/14 1650  WBC 10.7*  HGB 9.6*  HCT 29.6*  MCV 92.8  PLT 194   Cardiac Enzymes:  Recent Labs Lab 06/18/14 1708  TROPONINI 0.05*    BNP (last 3 results) No results for input(s): PROBNP in the last 8760 hours.    CBG:  Recent Labs Lab 06/16/14 2225  GLUCAP 95    Radiological Exams on Admission: Dg Chest Port 1 View  06/18/2014   CLINICAL DATA:  Hypotension and hypoxia  EXAM: PORTABLE CHEST - 1 VIEW  COMPARISON:  June 06, 2014  FINDINGS: There is underlying emphysematous change. There is generalized interstitial prominence consistent with edema. There is patchy airspace consolidation in the left base medially. Heart is upper normal in size with pulmonary vascularity within normal limits. No adenopathy. Pacemaker leads are attached to the right atrium and right ventricle. Patient is status post coronary artery bypass grafting. There is a total shoulder replacement on the right. There are surgical clips in the neck bilaterally.  IMPRESSION: Findings consistent with a degree of congestive heart failure superimposed on emphysematous change. Patchy airspace consolidation medial left base, likely representing pneumonia.   Electronically Signed    By: Lowella Grip M.D.   On: 06/18/2014 17:05    EKG: Independently reviewed.     Assessment/Plan Active Problems:   Cancer of bladder wall   Protein-calorie malnutrition, severe    acute hypoxemic respiratory failure  Patient has a history of COPD   this could be secondary to COPD exacerbation vs HCAP   patient could have had an underlying process prior to admission We'll start the patient on broad-spectrum antibiotics  Blood culture 2  obtain a chest x-ray PA lateral to  Definitely rule out pneumonia  start the patient on nebulizer treatments  Continue Mucinex    history of chronic diastolic heart failure  appears to have pulmonary congestion   But The patient is also in renal failure We'll repeat 2-D echo Cycle cardiac enzymes Maintain the patient on telemetry  Hold off on diuretics at this time   acute renal failure  likely prerenal and dehydrated  In the setting of elevated BUN  Baseline creatinine around 1.6-1.9  intermittent fluid boluses  For systolic less than 932  avoid NSAIDs    history of atrial fibrillation status post   Ablation , status post pacemaker  patient to continue with amiodarone   hold anticoagulation until hematuria resolves   will not bridge with Lovenox as Coumadin is just for atrial fibrillation history      hyponatremia Avoid half-normal saline  we'll use normal saline for intermittent fluid boluses   Nausea vomiting abdominal distention Suspect postoperative ileus   obtain abdominal KUB We'll start the patient on IV Reglan   Code Status:   full  Family Communication: bedside Disposition Plan: admit   Time spent: 70 mins   [facility] Pager [phone]  If 7PM-7AM, please contact night-coverage [email or web address] Password [facility] 06/18/2014, 6:04 PM

## 2014-06-18 NOTE — Progress Notes (Signed)
Hand irrigated foley with return of moderate amount of small clot. Urine cherry red currently.

## 2014-06-18 NOTE — Progress Notes (Signed)
RN has hand irrigated foley multiple times today per MD orders. No clots seen with irrigation or in foley bag. When irrigating, urine returned is pink. Will continue to hand irrigate and monitor output.

## 2014-06-18 NOTE — Progress Notes (Signed)
ANTIBIOTIC CONSULT NOTE - INITIAL  Pharmacy Consult for Vancomycin, Zosyn Indication: HCAP  Allergies  Allergen Reactions  . Ace Inhibitors Cough  . Clarithromycin Other (See Comments)    Strange thoughts and fell with biaxin  . Codeine Nausea And Vomiting  . Oxycodone-Acetaminophen Other (See Comments)    Felt closed in    Patient Measurements: Height: 5\' 10"  (177.8 cm) Weight: 169 lb (76.658 kg) IBW/kg (Calculated) : 73  Vital Signs: Temp: 98.6 F (37 C) (01/20 1324) Temp Source: Oral (01/20 1324) BP: 100/51 mmHg (01/20 1645) Pulse Rate: 86 (01/20 1645) Intake/Output from previous day: 01/19 0701 - 01/20 0700 In: 3140 [P.O.:840; I.V.:1800] Out: 2345 [Urine:2345] Intake/Output from this shift: Total I/O In: 1813.8 [P.O.:480; I.V.:733.8; Other:350; IV Piggyback:250] Out: 366 [Urine:875]  Labs:  Recent Labs  06/18/14 1650 06/18/14 1708  WBC 10.7*  --   HGB 9.6*  --   PLT 194  --   CREATININE  --  2.17*   Estimated Creatinine Clearance: 27.1 mL/min (by C-G formula based on Cr of 2.17). No results for input(s): VANCOTROUGH, VANCOPEAK, VANCORANDOM, GENTTROUGH, GENTPEAK, GENTRANDOM, TOBRATROUGH, TOBRAPEAK, TOBRARND, AMIKACINPEAK, AMIKACINTROU, AMIKACIN in the last 72 hours.   Microbiology: No results found for this or any previous visit (from the past 720 hour(s)).  Medical History: Past Medical History  Diagnosis Date  . Gout     "only once in my lifetime" (12/18/2012)  . Depression   . PVD (peripheral vascular disease)     s/p B CEA  . COPD (chronic obstructive pulmonary disease)      PFT 6.19.07: FEV1 465  ratio 60 with 25% response to B2.  > PFTs 8.29.07: FEV1 61%  ratio 46% no better after B2.  > add on advair 12.20.11-improved 1.31.12  . AAA (abdominal aortic aneurysm)   . Melanosis coli   . Internal hemorrhoids   . Carotid artery occlusion   . Aortic valve disorder   . Chronic diastolic heart failure 29/08/7652  . CAD (coronary artery disease),  native coronary artery     CABG w LIMA to LAD, SVG to dx, OM, RCA 1989 Dr. Arlyce Dice for 3VD PTCA of OM, 1999 and 2000 Cath showed occlusion of left main and RCA with stenosis in OM Redo redo CABG w SVG to OM, SVG to RCA8/10/00 Dr. Cyndia Bent   . Hyperlipidemia   . BPH (benign prostatic hypertrophy)   . Hypertensive heart disease     Change toprol to bisoprolol 10 mg daily on trial basis  05/21/2012  - notified by [facility] non adherent 07/17/2012    . LBBB (left bundle branch block)   . Second degree heart block 12/18/2012    s/p MDT Adapta L pacemaker 12-20-2012 by Dr Caryl Comes  . Shingles years ago    mild  . PONV (postoperative nausea and vomiting) 1944    with ether  . Abrasion of left forearm dec 2015    small scraped area healing  . Chronic kidney disease stage III (GFR 30-59 ml/min)     saw dr Justin Mend oct 2015 and released by dr webb  . Congenital absence of kidney     "noted during AAA repair; never knew it before" (12/18/2012)  . Cancer     bladder  . Gout years ago  . Myocardial infarction 1979  . Cough     coughing up green sputum last 4 to 5 days    Medications:  Scheduled:  . amiodarone  200 mg Oral Daily  . feeding supplement (ENSURE  COMPLETE)  237 mL Oral BID BM  . levalbuterol  1.25 mg Nebulization 3 times per day  . metoCLOPramide (REGLAN) injection  5 mg Intravenous 3 times per day  . mometasone-formoterol  2 puff Inhalation Q12H  . polyethylene glycol  17 g Oral QPC supper  . rosuvastatin  20 mg Oral Daily  . tamsulosin  0.4 mg Oral QHS  . temazepam  15 mg Oral QHS  . tiotropium  18 mcg Inhalation Daily   Infusions:  . sodium chloride    . sodium chloride irrigation     PRN: acetaminophen, colchicine, guaiFENesin, HYDROcodone-acetaminophen, HYDROmorphone (DILAUDID) injection, neomycin-bacitracin-polymyxin, nitroGLYCERIN, ondansetron, opium-belladonna, oxybutynin, promethazine, sodium chloride  Assessment: 81 yo male admitted 1/18 for management of bladder tumors, TURBT and  mitomycin. Discharge planned 1/20 then cancelled as patient found to be hypoxic and hypotensive. Pharmacy is consulted to dose vancomycin and zosyn for presumed HCAP.  1/18 >> Keflex >> 1/20 1/20 >> Vanc >> 1/20 >> Zosyn >>  Tmax: 99.2 WBC: 10.7 Renal: 2.17, CrCl 27 ml/min CG and N  1/20 blood x 2: sent  Goal of Therapy:  Vancomycin trough level 15-20 mcg/ml  Zosyn dose per renal function  Plan:  Zosyn 3.375gm IV q8h (4hr extended infusions) Vancomycin 1250mg  IV x 1, then 750mg  IV q24h Check trough at steady state Follow up renal function & cultures, clinical course  Peggyann Juba, PharmD, BCPS Pager: [phone] 06/18/2014,6:30 PM

## 2014-06-18 NOTE — Progress Notes (Addendum)
Foley catheter leaking  around tip of penis. Coke colored urine with no clots noted in foley tube/bag. Pt feeling pressure in rectum. Irrigated foley, moderate amount of dime and nickel size clots returned with irrigation. Urine currently cherry red. Will continue to monitor.

## 2014-06-18 NOTE — Progress Notes (Signed)
Vancomycin, and zosyn paused while lab completed blood cultures,

## 2014-06-18 NOTE — Progress Notes (Signed)
Troponin resulted as 0.05. MD made aware. Orders received to obtain and EKG. Pt down getting an Xray, will get EKG once he returns.

## 2014-06-18 NOTE — Progress Notes (Signed)
Pt unable to void since he was I&O cath at 1641. Pt c/o pressure and pain and is requesting the foley catheter to be placed. Bladder scan yielded 500 cc. Reported from previous nurse, that pt urine was rust color with some small clots when she I&O cath him, but pt was refusing foley catheter at that time.  Per order,20 fr foley catheter placed yielding 550 cc of coke color urine with a moderate amount of small clots noted in drainage bag. Writer hand irrigated foley and returned 2 moderate size clot and a moderate amount of small clots. Urine currently pink tinged and free of clot. Will continue to monitor.

## 2014-06-18 NOTE — Progress Notes (Signed)
2 Days Post-Op Subjective: Patient reports nausea-he had emesis while I was in the room. Still having some bladder spasms. He failed a voiding trial yesterday.  Objective: Vital signs in last 24 hours: Temp:  [98 F (36.7 C)-99.2 F (37.3 C)] 98 F (36.7 C) (01/20 0443) Pulse Rate:  [74-82] 81 (01/20 0443) Resp:  [18-20] 20 (01/20 0443) BP: (104-129)/(58-73) 104/58 mmHg (01/20 0443) SpO2:  [88 %-95 %] 93 % (01/20 0443)  Intake/Output from previous day: 01/19 0701 - 01/20 0700 In: 3020 [P.O.:720; I.V.:1800] Out: 2345 [Urine:2345] Intake/Output this shift:    Physical Exam:  Constitutional: Vital signs reviewed. WD WN in NAD   Eyes: PERRL, No scleral icterus.   Pulmonary/Chest: Normal effort Abdominal: Soft, no suprapubic tenderness. No rebound or guarding.  Lab Results: No results for input(s): HGB, HCT in the last 72 hours. BMET No results for input(s): NA, K, CL, CO2, GLUCOSE, BUN, CREATININE, CALCIUM in the last 72 hours.  Recent Labs  06/16/14 0835  INR 2.12*   No results for input(s): LABURIN in the last 72 hours. Results for orders placed or performed during the hospital encounter of 04/05/14  Urine culture     Status: None   Collection Time: 04/05/14  9:21 PM  Result Value Ref Range Status   Specimen Description URINE, CATHETERIZED  Final   Special Requests NONE  Final   Culture  Setup Time   Final    04/06/2014 16:58 Performed at [facility] Performed at [facility]   Final   Culture NO GROWTH Performed at [facility]   Final   Report Status 04/07/2014 FINAL  Final    Studies/Results: No results found.  Assessment/Plan:   Gross hematuria with some clots postop day 2 from resection of large bladder tumor. He is irrigating fairly clear now. He is having nausea and vomiting.    We will continue to keep him for observation and bladder irrigation. Promethazine ordered for nausea, hydromorphone  for pain.    I discussed pathology results with him-he has stage TI high-grade large volume urothelial carcinoma. He will eventually need BCG treatment.   LOS: 2 days   Franchot Gallo M 06/18/2014, 8:33 AM

## 2014-06-18 NOTE — Progress Notes (Signed)
UR completed 

## 2014-06-18 NOTE — Progress Notes (Signed)
Pt's BP was 99/52, HR 86, O2 Sats 77% on RA. O2 sats came up to 93% on 4L oxygen. MD made aware of these findings. Orders received for a NS bolus of 232mL. MD also was asked to d/c discharge order per Case manager. MD was in the middle of a procedure and couldn't get to a computer at this time, but states that he is now aware and will d/c the order when able. Will give the NS bolus and continue to monitor pt.

## 2014-06-19 DIAGNOSIS — J189 Pneumonia, unspecified organism: Secondary | ICD-10-CM | POA: Diagnosis not present

## 2014-06-19 DIAGNOSIS — Z87891 Personal history of nicotine dependence: Secondary | ICD-10-CM | POA: Diagnosis not present

## 2014-06-19 DIAGNOSIS — Q600 Renal agenesis, unilateral: Secondary | ICD-10-CM | POA: Diagnosis not present

## 2014-06-19 DIAGNOSIS — Z951 Presence of aortocoronary bypass graft: Secondary | ICD-10-CM | POA: Diagnosis not present

## 2014-06-19 DIAGNOSIS — E43 Unspecified severe protein-calorie malnutrition: Secondary | ICD-10-CM | POA: Diagnosis present

## 2014-06-19 DIAGNOSIS — I4891 Unspecified atrial fibrillation: Secondary | ICD-10-CM | POA: Diagnosis present

## 2014-06-19 DIAGNOSIS — Z950 Presence of cardiac pacemaker: Secondary | ICD-10-CM | POA: Diagnosis not present

## 2014-06-19 DIAGNOSIS — I5043 Acute on chronic combined systolic (congestive) and diastolic (congestive) heart failure: Secondary | ICD-10-CM | POA: Diagnosis not present

## 2014-06-19 DIAGNOSIS — Z96611 Presence of right artificial shoulder joint: Secondary | ICD-10-CM | POA: Diagnosis present

## 2014-06-19 DIAGNOSIS — Z79899 Other long term (current) drug therapy: Secondary | ICD-10-CM | POA: Diagnosis not present

## 2014-06-19 DIAGNOSIS — C670 Malignant neoplasm of trigone of bladder: Secondary | ICD-10-CM | POA: Diagnosis present

## 2014-06-19 DIAGNOSIS — I2510 Atherosclerotic heart disease of native coronary artery without angina pectoris: Secondary | ICD-10-CM | POA: Diagnosis present

## 2014-06-19 DIAGNOSIS — I4892 Unspecified atrial flutter: Secondary | ICD-10-CM | POA: Diagnosis not present

## 2014-06-19 DIAGNOSIS — J9601 Acute respiratory failure with hypoxia: Secondary | ICD-10-CM | POA: Diagnosis not present

## 2014-06-19 DIAGNOSIS — I739 Peripheral vascular disease, unspecified: Secondary | ICD-10-CM | POA: Diagnosis present

## 2014-06-19 DIAGNOSIS — T502X5A Adverse effect of carbonic-anhydrase inhibitors, benzothiadiazides and other diuretics, initial encounter: Secondary | ICD-10-CM | POA: Diagnosis not present

## 2014-06-19 DIAGNOSIS — N179 Acute kidney failure, unspecified: Secondary | ICD-10-CM | POA: Diagnosis not present

## 2014-06-19 DIAGNOSIS — D494 Neoplasm of unspecified behavior of bladder: Secondary | ICD-10-CM | POA: Diagnosis present

## 2014-06-19 DIAGNOSIS — N3289 Other specified disorders of bladder: Secondary | ICD-10-CM | POA: Diagnosis present

## 2014-06-19 DIAGNOSIS — Y95 Nosocomial condition: Secondary | ICD-10-CM | POA: Diagnosis present

## 2014-06-19 DIAGNOSIS — C675 Malignant neoplasm of bladder neck: Secondary | ICD-10-CM | POA: Diagnosis present

## 2014-06-19 DIAGNOSIS — J449 Chronic obstructive pulmonary disease, unspecified: Secondary | ICD-10-CM | POA: Diagnosis present

## 2014-06-19 DIAGNOSIS — K567 Ileus, unspecified: Secondary | ICD-10-CM | POA: Diagnosis not present

## 2014-06-19 DIAGNOSIS — Z7901 Long term (current) use of anticoagulants: Secondary | ICD-10-CM | POA: Diagnosis not present

## 2014-06-19 DIAGNOSIS — E871 Hypo-osmolality and hyponatremia: Secondary | ICD-10-CM | POA: Diagnosis present

## 2014-06-19 DIAGNOSIS — N184 Chronic kidney disease, stage 4 (severe): Secondary | ICD-10-CM | POA: Diagnosis present

## 2014-06-19 DIAGNOSIS — E876 Hypokalemia: Secondary | ICD-10-CM | POA: Diagnosis not present

## 2014-06-19 DIAGNOSIS — I252 Old myocardial infarction: Secondary | ICD-10-CM | POA: Diagnosis not present

## 2014-06-19 DIAGNOSIS — Z9049 Acquired absence of other specified parts of digestive tract: Secondary | ICD-10-CM | POA: Diagnosis present

## 2014-06-19 DIAGNOSIS — R339 Retention of urine, unspecified: Secondary | ICD-10-CM | POA: Diagnosis not present

## 2014-06-19 DIAGNOSIS — R310 Gross hematuria: Secondary | ICD-10-CM | POA: Diagnosis present

## 2014-06-19 DIAGNOSIS — E8770 Fluid overload, unspecified: Secondary | ICD-10-CM | POA: Diagnosis not present

## 2014-06-19 DIAGNOSIS — Z6824 Body mass index (BMI) 24.0-24.9, adult: Secondary | ICD-10-CM | POA: Diagnosis not present

## 2014-06-19 DIAGNOSIS — D62 Acute posthemorrhagic anemia: Secondary | ICD-10-CM | POA: Diagnosis not present

## 2014-06-19 DIAGNOSIS — E860 Dehydration: Secondary | ICD-10-CM | POA: Diagnosis present

## 2014-06-19 DIAGNOSIS — E785 Hyperlipidemia, unspecified: Secondary | ICD-10-CM | POA: Diagnosis present

## 2014-06-19 DIAGNOSIS — B3749 Other urogenital candidiasis: Secondary | ICD-10-CM | POA: Diagnosis present

## 2014-06-19 LAB — BASIC METABOLIC PANEL
ANION GAP: 7 (ref 5–15)
BUN: 40 mg/dL — AB (ref 6–23)
CO2: 23 mmol/L (ref 19–32)
Calcium: 7.9 mg/dL — ABNORMAL LOW (ref 8.4–10.5)
Chloride: 99 mEq/L (ref 96–112)
Creatinine, Ser: 2.12 mg/dL — ABNORMAL HIGH (ref 0.50–1.35)
GFR calc non Af Amer: 27 mL/min — ABNORMAL LOW (ref >90)
GFR, EST AFRICAN AMERICAN: 32 mL/min — AB (ref >90)
Glucose, Bld: 107 mg/dL — ABNORMAL HIGH (ref 70–99)
Potassium: 4.5 mmol/L (ref 3.5–5.1)
Sodium: 129 mmol/L — ABNORMAL LOW (ref 135–145)

## 2014-06-19 LAB — TROPONIN I: TROPONIN I: 0.04 ng/mL — AB (ref <0.031)

## 2014-06-19 MED ORDER — BELLADONNA ALKALOIDS-OPIUM 16.2-60 MG RE SUPP
1.0000 | Freq: Four times a day (QID) | RECTAL | Status: DC | PRN
  Administered 2014-06-19 – 2014-06-21 (×3): 1 via RECTAL
  Filled 2014-06-19 (×2): qty 1

## 2014-06-19 MED ORDER — PROMETHAZINE HCL 25 MG/ML IJ SOLN: 12.5000 mg | Freq: Four times a day (QID) | INTRAMUSCULAR | Status: DC | PRN

## 2014-06-19 NOTE — Evaluation (Signed)
Physical Therapy Evaluation Patient Details Name: Edward [name] MRN: [id number] DOB: [birth year] Today's Date: 06/19/2014   History of Present Illness  81 yo male s/p TURBT 06/16/14. Hx of COPD, CAD, pacemaker, A fib, AAA, HTN, PVD, BPH  Clinical Impression  On eval, pt required Min assist for mobility-able to ambulate ~150 feet with RW. Unsteady with ambulation. Fatigues easily. O2 sats 88% on RA with ambulation, 94% on RA at rest (end of session). Max encouragement for pt participation. Recommend daily mobility/ambulation with nursing assistance.     Follow Up Recommendations Home health PT;Supervision/Assistance - 24 hour    Equipment Recommendations  Rolling walker with 5" wheels    Recommendations for Other Services OT consult     Precautions / Restrictions Precautions Precautions: Fall Restrictions Weight Bearing Restrictions: No      Mobility  Bed Mobility Overal bed mobility: Needs Assistance Bed Mobility: Supine to Sit;Sit to Supine     Supine to sit: Min guard;HOB elevated Sit to supine: Min assist;HOB elevated   General bed mobility comments: Increased time. Assist for LEs.   Transfers Overall transfer level: Needs assistance Equipment used: Rolling walker (2 wheeled) Transfers: Sit to/from Stand Sit to Stand: Min assist         General transfer comment: Assist to rise, stabilize, control descent.   Ambulation/Gait Ambulation/Gait assistance: Min assist Ambulation Distance (Feet): 150 Feet Assistive device: Rolling walker (2 wheeled) Gait Pattern/deviations: Step-through pattern;Decreased stride length     General Gait Details: Assist to stabilize during ambulation. Dyspnea 2/4. O2 sats 88% on RA. Fatigues fairly easily.   Stairs            Wheelchair Mobility    Modified Rankin (Stroke Patients Only)       Balance Overall balance assessment: Needs assistance         Standing balance support: Bilateral upper extremity  supported;During functional activity Standing balance-Leahy Scale: Poor                               Pertinent Vitals/Pain Pain Assessment: No/denies pain    Home Living Family/patient expects to be discharged to:: Private residence Living Arrangements: Children Available Help at Discharge: Family Type of Home: House Home Access: Stairs to enter Entrance Stairs-Rails: Right Entrance Stairs-Number of Steps: 2 Home Layout: One level Home Equipment: Cane - single point      Prior Function Level of Independence: Independent               Hand Dominance        Extremity/Trunk Assessment   Upper Extremity Assessment: Generalized weakness           Lower Extremity Assessment: Generalized weakness      Cervical / Trunk Assessment: Kyphotic  Communication   Communication: No difficulties  Cognition Arousal/Alertness: Awake/alert Behavior During Therapy: WFL for tasks assessed/performed Overall Cognitive Status: Within Functional Limits for tasks assessed                      General Comments      Exercises        Assessment/Plan    PT Assessment Patient needs continued PT services  PT Diagnosis Difficulty walking;Generalized weakness   PT Problem List Decreased strength;Decreased activity tolerance;Decreased balance;Decreased mobility;Decreased knowledge of use of DME  PT Treatment Interventions DME instruction;Gait training;Functional mobility training;Therapeutic activities;Therapeutic exercise;Patient/family education;Balance training   PT Goals (Current goals can be found in  the Care Plan section) Acute Rehab PT Goals Patient Stated Goal: none stated PT Goal Formulation: With patient Time For Goal Achievement: 07/03/14 Potential to Achieve Goals: Good    Frequency Min 3X/week   Barriers to discharge        Co-evaluation               End of Session   Activity Tolerance: Patient limited by fatigue Patient left:  in bed;with call bell/phone within reach           Time: 1435-1450 PT Time Calculation (min) (ACUTE ONLY): 15 min   Charges:   PT Evaluation $Initial PT Evaluation Tier I: 1 Procedure PT Treatments $Gait Training: 8-22 mins   PT G Codes:        Weston Anna, MPT Pager: [phone]

## 2014-06-19 NOTE — Progress Notes (Signed)
RN re-checked the left arm. No erythema and patient had no complaints of pain. Will continue to monitor the patient.

## 2014-06-19 NOTE — Progress Notes (Signed)
3 Days Post-Op Subjective: Patient reports less nausea and less bladder spasms.  Appreciate hospitalist input.  Objective: Vital signs in last 24 hours: Temp:  [98.2 F (36.8 C)-98.6 F (37 C)] 98.4 F (36.9 C) (01/21 6213) Pulse Rate:  [77-87] 77 (01/21 0608) Resp:  [16-20] 20 (01/21 0608) BP: (98-119)/(48-61) 112/50 mmHg (01/21 0608) SpO2:  [77 %-100 %] 98 % (01/21 0608)  Intake/Output from previous day: 01/20 0701 - 01/21 0700 In: 2402.9 [P.O.:480; I.V.:1172.9; IV Piggyback:350] Out: 1775 [Urine:1775] Intake/Output this shift:    Physical Exam:  Constitutional: Vital signs reviewed. WD WN in NAD   Eyes: PERRL, No scleral icterus.   Cardiovascular: RRR Pulmonary/Chest: Normal effort Abdominal: Soft. Non-tender, minimally distended, , no masses, organomegaly, or guarding present.  Genitourinary: Foley catheter in place. Draining appropriately Extremities: No cyanosis or edema   Catheter was irrigated. No clots returned. Effluent is slightly pink. Lab Results:  Recent Labs  06/18/14 1650  HGB 9.6*  HCT 29.6*   BMET  Recent Labs  06/18/14 1708  NA 129*  K 4.4  CL 98  CO2 24  GLUCOSE 119*  BUN 41*  CREATININE 2.17*  CALCIUM 8.2*    Recent Labs  06/16/14 0835 06/18/14 1708  INR 2.12* 1.91*   No results for input(s): LABURIN in the last 72 hours. Results for orders placed or performed during the hospital encounter of 04/05/14  Urine culture     Status: None   Collection Time: 04/05/14  9:21 PM  Result Value Ref Range Status   Specimen Description URINE, CATHETERIZED  Final   Special Requests NONE  Final   Culture  Setup Time   Final    04/06/2014 16:58 Performed at [place] Performed at [facility]   Final   Culture NO GROWTH Performed at [facility]   Final   Report Status 04/07/2014 FINAL  Final    Studies/Results: Dg Chest 2 View  06/18/2014   CLINICAL DATA:  Shortness of Breath   EXAM: CHEST  2 VIEW  COMPARISON:  Study obtained earlier in the day  FINDINGS: There is now focal airspace consolidation in the right upper lobe. There is underlying emphysema with slightly less interstitial edema. Heart is prominent with pulmonary vascularity within normal limits. Pacemaker leads are attached to the right heart. No adenopathy.  IMPRESSION: New patchy infiltrate right upper lobe. Less interstitial edema compared to earlier in the day. Underlying emphysema.   Electronically Signed   By: Lowella Grip M.D.   On: 06/18/2014 18:56   Dg Abd 1 View  06/18/2014   CLINICAL DATA:  Nausea and vomiting today. Some abdominal distension also.  EXAM: ABDOMEN - 1 VIEW  COMPARISON:  CT, 06/11/2014  FINDINGS: Mildly distended loops of small bowel are noted in the left central abdomen. And mild gas distention of the right and transverse colon. Air is seen in a normal caliber descending colon and within the rectum.  There are surgical vascular clips in the central abdomen. Clips from a prior cholecystectomy are also noted.  Aortoiliac vascular calcifications as well as common femoral artery vascular calcifications also noted. Soft tissues otherwise unremarkable.  IMPRESSION: 1. Mild distention of loops of small bowel and portions of the colon. Findings suggest an adynamic ileus. Partial small bowel obstruction is possible but felt less likely. No other evidence of an acute abnormality.   Electronically Signed   By: Lajean Manes M.D.   On: 06/18/2014  18:56   Dg Chest Port 1 View  06/18/2014   CLINICAL DATA:  Hypotension and hypoxia  EXAM: PORTABLE CHEST - 1 VIEW  COMPARISON:  June 06, 2014  FINDINGS: There is underlying emphysematous change. There is generalized interstitial prominence consistent with edema. There is patchy airspace consolidation in the left base medially. Heart is upper normal in size with pulmonary vascularity within normal limits. No adenopathy. Pacemaker leads are attached to the  right atrium and right ventricle. Patient is status post coronary artery bypass grafting. There is a total shoulder replacement on the right. There are surgical clips in the neck bilaterally.  IMPRESSION: Findings consistent with a degree of congestive heart failure superimposed on emphysematous change. Patchy airspace consolidation medial left base, likely representing pneumonia.   Electronically Signed   By: Lowella Grip M.D.   On: 06/18/2014 17:05    Assessment/Plan:   Postoperative day #3 TURBT with placement of mitomycin. He may have a pneumonia, currently appropriately treated with IV antibiotics.    Abdominal distention may be from mild ileus which I think is resolving, as he had 2 bowel movements yesterday and is less nauseous.    Hematuria is resolving. Despite stopping his Coumadin 5 days in advance of the procedure, I would imagine his INR was probably a bit out.    I will leave the catheter in another day, and if he is doing well over the next 24 hours consider removing it tomorrow. It looks like he'll need antibiotic management for a while. I stressed for him incentive spirometry, getting out of bed.    We will advance his diet as tolerated.    I discussed progress with his son, Aaron Edelman.   LOS: 3 days   Franchot Gallo M 06/19/2014, 7:35 AM

## 2014-06-19 NOTE — Progress Notes (Signed)
The patient is c/o an area of erythema on left medial antecubital. RN to give Tylenol and apply ice to area.  PCP on call was notified.

## 2014-06-19 NOTE — Progress Notes (Signed)
[facility] PROGRESS NOTE  Wynn Alldredge Deshpande RWE:[id number] DOB: 1930/03/28 DOA: 06/16/2014 PCP: Gwendolyn Grant, MD  Assessment/Plan: 1-Acute hypoxic Respiratory Failure;  In setting of PNA.  Improving. Continue with IV antibiotics.  Mucinex, nebulizer treatments.   2-Chronic diastolic HF; appears compensated. Monitor respiratory status on IV fluids.  Hold lasix due to hypotension and renal failure.   3-Acute Renal Failure;  Baseline creatinine around 1.6-1.9 Avoid hypotension.  Continue with IV fluids.    4-ial fibrillation status post Ablation , status post pacemaker patient to continue with amiodarone  hold anticoagulation until hematuria resolves  Check INR.   5-ileus:improving clinically. Will discontinue Reglan.   6-hyponatremia: stable continue with IV fluids.  7-hematuria; post surgery and in setting anticoagulation. Repeat hb in am.  8-mild elevated troponin. In setting renal failure. ekg with ventricular paced rhythm.   Code Status: full code.  Family Communication: care discussed with son, who was at bedside.  Disposition Plan: remain inpatient   Antibiotics:  Vancomycin 1-20  Zosyn 1-20  HPI/Subjective: Feeling better. He is breathing better, cough improved.  Had 2 BM yesterday.   Objective: Filed Vitals:   06/19/14 1211  BP: 99/58  Pulse:   Temp:   Resp:     Intake/Output Summary (Last 24 hours) at 06/19/14 1251 Last data filed at 06/19/14 0515  Gross per 24 hour  Intake 2042.92 ml  Output   1425 ml  Net 617.92 ml   Filed Weights   06/16/14 0800  Weight: 76.658 kg (169 lb)    Exam:   General:  Alert in no distress.   Cardiovascular: S 1, S 2 RRR  Respiratory: cta  Abdomen: bs present, distended. No rigidity  Musculoskeletal: scd in place.    Data Reviewed: Basic Metabolic Panel:  Recent Labs Lab 06/18/14 1708 06/19/14 0820  NA 129* 129*  K 4.4 4.5  CL 98 99  CO2 24 23  GLUCOSE 119* 107*  BUN 41* 40*   CREATININE 2.17* 2.12*  CALCIUM 8.2* 7.9*   Liver Function Tests:  Recent Labs Lab 06/18/14 1708  AST 34  ALT 17  ALKPHOS 92  BILITOT 1.1  PROT 5.4*  ALBUMIN 2.6*   No results for input(s): LIPASE, AMYLASE in the last 168 hours. No results for input(s): AMMONIA in the last 168 hours. CBC:  Recent Labs Lab 06/18/14 1650  WBC 10.7*  HGB 9.6*  HCT 29.6*  MCV 92.8  PLT 194   Cardiac Enzymes:  Recent Labs Lab 06/18/14 1708 06/18/14 2245 06/19/14 0432  TROPONINI 0.05* 0.04* 0.04*   BNP (last 3 results) No results for input(s): PROBNP in the last 8760 hours. CBG:  Recent Labs Lab 06/16/14 2225  GLUCAP 95    No results found for this or any previous visit (from the past 240 hour(s)).   Studies: Dg Chest 2 View  06/18/2014   CLINICAL DATA:  Shortness of Breath  EXAM: CHEST  2 VIEW  COMPARISON:  Study obtained earlier in the day  FINDINGS: There is now focal airspace consolidation in the right upper lobe. There is underlying emphysema with slightly less interstitial edema. Heart is prominent with pulmonary vascularity within normal limits. Pacemaker leads are attached to the right heart. No adenopathy.  IMPRESSION: New patchy infiltrate right upper lobe. Less interstitial edema compared to earlier in the day. Underlying emphysema.   Electronically Signed   By: Lowella Grip M.D.   On: 06/18/2014 18:56   Dg Abd 1 View  06/18/2014   CLINICAL DATA:  Nausea and vomiting today. Some abdominal distension also.  EXAM: ABDOMEN - 1 VIEW  COMPARISON:  CT, 06/11/2014  FINDINGS: Mildly distended loops of small bowel are noted in the left central abdomen. And mild gas distention of the right and transverse colon. Air is seen in a normal caliber descending colon and within the rectum.  There are surgical vascular clips in the central abdomen. Clips from a prior cholecystectomy are also noted.  Aortoiliac vascular calcifications as well as common femoral artery vascular  calcifications also noted. Soft tissues otherwise unremarkable.  IMPRESSION: 1. Mild distention of loops of small bowel and portions of the colon. Findings suggest an adynamic ileus. Partial small bowel obstruction is possible but felt less likely. No other evidence of an acute abnormality.   Electronically Signed   By: Lajean Manes M.D.   On: 06/18/2014 18:56   Dg Chest Port 1 View  06/18/2014   CLINICAL DATA:  Hypotension and hypoxia  EXAM: PORTABLE CHEST - 1 VIEW  COMPARISON:  June 06, 2014  FINDINGS: There is underlying emphysematous change. There is generalized interstitial prominence consistent with edema. There is patchy airspace consolidation in the left base medially. Heart is upper normal in size with pulmonary vascularity within normal limits. No adenopathy. Pacemaker leads are attached to the right atrium and right ventricle. Patient is status post coronary artery bypass grafting. There is a total shoulder replacement on the right. There are surgical clips in the neck bilaterally.  IMPRESSION: Findings consistent with a degree of congestive heart failure superimposed on emphysematous change. Patchy airspace consolidation medial left base, likely representing pneumonia.   Electronically Signed   By: Lowella Grip M.D.   On: 06/18/2014 17:05    Scheduled Meds: . amiodarone  200 mg Oral Daily  . feeding supplement (ENSURE COMPLETE)  237 mL Oral BID BM  . levalbuterol  1.25 mg Nebulization 3 times per day  . metoCLOPramide (REGLAN) injection  5 mg Intravenous 3 times per day  . mometasone-formoterol  2 puff Inhalation Q12H  . piperacillin-tazobactam (ZOSYN)  IV  3.375 g Intravenous Q8H  . polyethylene glycol  17 g Oral QPC supper  . rosuvastatin  20 mg Oral Daily  . tamsulosin  0.4 mg Oral QHS  . temazepam  15 mg Oral QHS  . tiotropium  18 mcg Inhalation Daily  . vancomycin  750 mg Intravenous Q24H   Continuous Infusions: . sodium chloride 50 mL/hr at 06/18/14 2028  . sodium  chloride irrigation      Active Problems:   Cancer of bladder wall   Protein-calorie malnutrition, severe    Time spent: 35 minutes.    Niel Hummer A  [facility] Pager [phone]. If 7PM-7AM, please contact night-coverage at [email or web address], password [facility] 06/19/2014, 12:51 PM  LOS: 3 days

## 2014-06-19 NOTE — Progress Notes (Signed)
Patient ambulated to bathroom with son, now c/o increased shortness of breath, noted breath sounds remain diminished in lower bases, Sat's 100 % on 3 liters at this time. Patient encouraged to take deep slow breath's to recover from being ambulation. family at bedside.

## 2014-06-19 NOTE — Progress Notes (Signed)
  Echocardiogram 2D Echocardiogram has been performed.  Darlina Sicilian M 06/19/2014, 5:25 PM

## 2014-06-20 DIAGNOSIS — I959 Hypotension, unspecified: Secondary | ICD-10-CM

## 2014-06-20 DIAGNOSIS — E43 Unspecified severe protein-calorie malnutrition: Secondary | ICD-10-CM

## 2014-06-20 DIAGNOSIS — J189 Pneumonia, unspecified organism: Secondary | ICD-10-CM

## 2014-06-20 LAB — BASIC METABOLIC PANEL
ANION GAP: 8 (ref 5–15)
BUN: 36 mg/dL — ABNORMAL HIGH (ref 6–23)
CHLORIDE: 103 meq/L (ref 96–112)
CO2: 24 mmol/L (ref 19–32)
CREATININE: 1.92 mg/dL — AB (ref 0.50–1.35)
Calcium: 8.2 mg/dL — ABNORMAL LOW (ref 8.4–10.5)
GFR calc Af Amer: 36 mL/min — ABNORMAL LOW (ref >90)
GFR calc non Af Amer: 31 mL/min — ABNORMAL LOW (ref >90)
GLUCOSE: 113 mg/dL — AB (ref 70–99)
Potassium: 4.4 mmol/L (ref 3.5–5.1)
SODIUM: 135 mmol/L (ref 135–145)

## 2014-06-20 LAB — CBC
HEMATOCRIT: 27.7 % — AB (ref 39.0–52.0)
Hemoglobin: 8.8 g/dL — ABNORMAL LOW (ref 13.0–17.0)
MCH: 30 pg (ref 26.0–34.0)
MCHC: 31.8 g/dL (ref 30.0–36.0)
MCV: 94.5 fL (ref 78.0–100.0)
Platelets: 221 10*3/uL (ref 150–400)
RBC: 2.93 MIL/uL — AB (ref 4.22–5.81)
RDW: 14.3 % (ref 11.5–15.5)
WBC: 8.4 10*3/uL (ref 4.0–10.5)

## 2014-06-20 LAB — PROTIME-INR
INR: 2.06 — ABNORMAL HIGH (ref 0.00–1.49)
Prothrombin Time: 23.4 seconds — ABNORMAL HIGH (ref 11.6–15.2)

## 2014-06-20 MED ORDER — HYDROCODONE-ACETAMINOPHEN 5-325 MG PO TABS
1.0000 | ORAL_TABLET | Freq: Four times a day (QID) | ORAL | Status: DC | PRN
  Administered 2014-06-20 (×2): 1 via ORAL
  Filled 2014-06-20 (×2): qty 1

## 2014-06-20 MED ORDER — BUDESONIDE 0.25 MG/2ML IN SUSP
0.2500 mg | Freq: Two times a day (BID) | RESPIRATORY_TRACT | Status: DC
  Administered 2014-06-20 – 2014-06-28 (×16): 0.25 mg via RESPIRATORY_TRACT
  Filled 2014-06-20 (×16): qty 2

## 2014-06-20 MED ORDER — HYDROMORPHONE HCL 1 MG/ML IJ SOLN
0.5000 mg | INTRAMUSCULAR | Status: DC | PRN
Start: 2014-06-20 — End: 2014-06-21

## 2014-06-20 NOTE — Progress Notes (Signed)
4 Days Post-Op Subjective: Patient reports less bladder spasms. He has no nausea. He had another bowel movement last night. He was a bit restless, according to his son last night.  Objective: Vital signs in last 24 hours: Temp:  [97.4 F (36.3 C)-99.1 F (37.3 C)] 97.4 F (36.3 C) (01/22 0457) Pulse Rate:  [70-82] 70 (01/22 0457) Resp:  [16-18] 18 (01/22 0457) BP: (99-130)/(51-74) 105/52 mmHg (01/22 0457) SpO2:  [87 %-98 %] 98 % (01/22 0833)  Intake/Output from previous day: 01/21 0701 - 01/22 0700 In: [place] [P.O.:480; I.V.:755; IV Piggyback:300] Out: 1650 [Urine:1650] Intake/Output this shift:    Physical Exam:  Constitutional: Vital signs reviewed. WD WN in NAD   Eyes: PERRL, No scleral icterus.   Cardiovascular: RRR Pulmonary/Chest: Normal effort  Catheter irrigated fairly easily without clots.  Lab Results:  Recent Labs  06/18/14 1650 06/20/14 0542  HGB 9.6* 8.8*  HCT 29.6* 27.7*   BMET  Recent Labs  06/19/14 0820 06/20/14 0542  NA 129* 135  K 4.5 4.4  CL 99 103  CO2 23 24  GLUCOSE 107* 113*  BUN 40* 36*  CREATININE 2.12* 1.92*  CALCIUM 7.9* 8.2*    Recent Labs  06/18/14 1708 06/20/14 0542  INR 1.91* 2.06*   No results for input(s): LABURIN in the last 72 hours. Results for orders placed or performed during the hospital encounter of 06/16/14  Culture, blood (routine x 2)     Status: None (Preliminary result)   Collection Time: 06/18/14  8:33 PM  Result Value Ref Range Status   Specimen Description BLOOD LEFT ARM  Final   Special Requests BOTTLES DRAWN AEROBIC AND ANAEROBIC 10CC  Final   Culture   Final           BLOOD CULTURE RECEIVED NO GROWTH TO DATE CULTURE WILL BE HELD FOR 5 DAYS BEFORE ISSUING A FINAL NEGATIVE REPORT Performed at [facility]    Report Status PENDING  Incomplete  Culture, blood (routine x 2)     Status: None (Preliminary result)   Collection Time: 06/18/14  8:38 PM  Result Value Ref Range Status   Specimen  Description BLOOD LEFT ARM  Final   Special Requests BOTTLES DRAWN AEROBIC ONLY 5 CC  Final   Culture   Final           BLOOD CULTURE RECEIVED NO GROWTH TO DATE CULTURE WILL BE HELD FOR 5 DAYS BEFORE ISSUING A FINAL NEGATIVE REPORT Performed at [facility]    Report Status PENDING  Incomplete    Studies/Results: Dg Chest 2 View  06/18/2014   CLINICAL DATA:  Shortness of Breath  EXAM: CHEST  2 VIEW  COMPARISON:  Study obtained earlier in the day  FINDINGS: There is now focal airspace consolidation in the right upper lobe. There is underlying emphysema with slightly less interstitial edema. Heart is prominent with pulmonary vascularity within normal limits. Pacemaker leads are attached to the right heart. No adenopathy.  IMPRESSION: New patchy infiltrate right upper lobe. Less interstitial edema compared to earlier in the day. Underlying emphysema.   Electronically Signed   By: Lowella Grip M.D.   On: 06/18/2014 18:56   Dg Abd 1 View  06/18/2014   CLINICAL DATA:  Nausea and vomiting today. Some abdominal distension also.  EXAM: ABDOMEN - 1 VIEW  COMPARISON:  CT, 06/11/2014  FINDINGS: Mildly distended loops of small bowel are noted in the left central abdomen. And mild gas distention of the right and  transverse colon. Air is seen in a normal caliber descending colon and within the rectum.  There are surgical vascular clips in the central abdomen. Clips from a prior cholecystectomy are also noted.  Aortoiliac vascular calcifications as well as common femoral artery vascular calcifications also noted. Soft tissues otherwise unremarkable.  IMPRESSION: 1. Mild distention of loops of small bowel and portions of the colon. Findings suggest an adynamic ileus. Partial small bowel obstruction is possible but felt less likely. No other evidence of an acute abnormality.   Electronically Signed   By: Lajean Manes M.D.   On: 06/18/2014 18:56   Dg Chest Port 1 View  06/18/2014   CLINICAL DATA:   Hypotension and hypoxia  EXAM: PORTABLE CHEST - 1 VIEW  COMPARISON:  June 06, 2014  FINDINGS: There is underlying emphysematous change. There is generalized interstitial prominence consistent with edema. There is patchy airspace consolidation in the left base medially. Heart is upper normal in size with pulmonary vascularity within normal limits. No adenopathy. Pacemaker leads are attached to the right atrium and right ventricle. Patient is status post coronary artery bypass grafting. There is a total shoulder replacement on the right. There are surgical clips in the neck bilaterally.  IMPRESSION: Findings consistent with a degree of congestive heart failure superimposed on emphysematous change. Patchy airspace consolidation medial left base, likely representing pneumonia.   Electronically Signed   By: Lowella Grip M.D.   On: 06/18/2014 17:05    Assessment/Plan:   Postoperative day #4 TURBT of large bladder tumor. He has less hematuria, and catheter was removed today    Renal function has improved a bit. Hematocrit is stable. He looks better from a pulmonary standpoint.    His catheter was removed for voiding trial. If he is unable to void first, we'll try an in and out catheterization. Hopefully, from a solely urologic status he will be fit for discharge tomorrow. We'll leave this up to the medical service, however.   LOS: 4 days   Franchot Gallo M 06/20/2014, 10:58 AM

## 2014-06-20 NOTE — Progress Notes (Signed)
[facility] PROGRESS NOTE  Edward [name] JOA:[id number] DOB: 26-Jan-1930 DOA: 06/16/2014 PCP: Gwendolyn Grant, MD  Assessment/Plan: 1-Acute hypoxic Respiratory Failure in the setting of HCAP  -Improving. Continue with IV antibiotics.  -Mucinex, nebulizer treatments.   2-Chronic combined systolic/diastolic HF; appears compensated.  -Monitor respiratory status on IV fluids.  -Hold lasix due to hypotension and renal failure.  -no ACE/ARB due to renal failure with recent AKI -follow daily weight  3-Acute Renal Failure;  -Baseline creatinine around 1.6-1.9 at baseline -after IVF's, Cr 1.92 (very close to baseline) -Avoid hypotension, hold nephrotoxic agents.  -Continue with IV fluids, but will reduce rate.    4-Atrial fibrillation status post Ablation , status post pacemaker patient to continue with amiodarone  hold anticoagulation until hematuria resolves   5-ileus:improving clinically. Has had another BM overnight. Will advance diet.  6-hyponatremia: improved. Na 135; will monitor. 7-hematuria; post surgery and in setting anticoagulation. Repeat hb 8.8; will monitor. No transfusion needed 8-mild elevated troponin. In setting renal failure. ekg with ventricular paced rhythm. No CP. Will monitor for now.  Code Status: full code.  Family Communication: care discussed with son, who was at bedside.  Disposition Plan: per primary service; continue IV antibiotics.   Procedures - Left ventricle: The cavity size was normal. There was moderate concentric hypertrophy. Systolic function was mildly to moderately reduced. The estimated ejection fraction was in the range of 40% to 45%. Moderate hypokinesis of the apical myocardium. Mild hypokinesis of the inferior myocardium. Features are consistent with a pseudonormal left ventricular filling pattern, with concomitant abnormal relaxation and increased filling pressure (grade 2 diastolic dysfunction). - Aortic  valve: Valve mobility was mildly restricted. There was mild to moderate stenosis. There was mild regurgitation. Valve area (VTI): 1.23 cm^2. Valve area (Vmax): 0.93 cm^2. Valve area (Vmean): 0.9 cm^2. - Mitral valve: Mildly to moderately calcified annulus. There was moderate regurgitation. - Left atrium: The atrium was mildly to moderately dilated. - Right atrium: The atrium was mildly dilated. - Pulmonary arteries: Systolic pressure was moderately increased. PA peak pressure: 55 mm Hg (S).  Antibiotics:  Vancomycin 1-20  Zosyn 1-20  HPI/Subjective: Feeling better. No fever. No CP. Breathing continue improving.   Objective: Filed Vitals:   06/20/14 0457  BP: 105/52  Pulse: 70  Temp: 97.4 F (36.3 C)  Resp: 18    Intake/Output Summary (Last 24 hours) at 06/20/14 0958 Last data filed at 06/20/14 0500  Gross per 24 hour  Intake   1415 ml  Output   1650 ml  Net   -235 ml   Filed Weights   06/16/14 0800  Weight: 76.658 kg (169 lb)    Exam:   General:  Alert, afebrile, in no distress.   Cardiovascular: S 1, S 2 RRR  Respiratory: scattered rhonchi, no wheezing  Abdomen: bs present, distended. No rigidity  Musculoskeletal: scd's in place.    Data Reviewed: Basic Metabolic Panel:  Recent Labs Lab 06/18/14 1708 06/19/14 0820 06/20/14 0542  NA 129* 129* 135  K 4.4 4.5 4.4  CL 98 99 103  CO2 24 23 24   GLUCOSE 119* 107* 113*  BUN 41* 40* 36*  CREATININE 2.17* 2.12* 1.92*  CALCIUM 8.2* 7.9* 8.2*   Liver Function Tests:  Recent Labs Lab 06/18/14 1708  AST 34  ALT 17  ALKPHOS 92  BILITOT 1.1  PROT 5.4*  ALBUMIN 2.6*   CBC:  Recent Labs Lab 06/18/14 1650 06/20/14 0542  WBC 10.7* 8.4  HGB 9.6* 8.8*  HCT  29.6* 27.7*  MCV 92.8 94.5  PLT 194 221   Cardiac Enzymes:  Recent Labs Lab 06/18/14 1708 06/18/14 2245 06/19/14 0432  TROPONINI 0.05* 0.04* 0.04*   CBG:  Recent Labs Lab 06/16/14 2225  GLUCAP 95    Recent Results  (from the past 240 hour(s))  Culture, blood (routine x 2)     Status: None (Preliminary result)   Collection Time: 06/18/14  8:33 PM  Result Value Ref Range Status   Specimen Description BLOOD LEFT ARM  Final   Special Requests BOTTLES DRAWN AEROBIC AND ANAEROBIC 10CC  Final   Culture   Final           BLOOD CULTURE RECEIVED NO GROWTH TO DATE CULTURE WILL BE HELD FOR 5 DAYS BEFORE ISSUING A FINAL NEGATIVE REPORT Performed at [facility]    Report Status PENDING  Incomplete  Culture, blood (routine x 2)     Status: None (Preliminary result)   Collection Time: 06/18/14  8:38 PM  Result Value Ref Range Status   Specimen Description BLOOD LEFT ARM  Final   Special Requests BOTTLES DRAWN AEROBIC ONLY 5 CC  Final   Culture   Final           BLOOD CULTURE RECEIVED NO GROWTH TO DATE CULTURE WILL BE HELD FOR 5 DAYS BEFORE ISSUING A FINAL NEGATIVE REPORT Performed at [facility]    Report Status PENDING  Incomplete     Studies: Dg Chest 2 View  06/18/2014   CLINICAL DATA:  Shortness of Breath  EXAM: CHEST  2 VIEW  COMPARISON:  Study obtained earlier in the day  FINDINGS: There is now focal airspace consolidation in the right upper lobe. There is underlying emphysema with slightly less interstitial edema. Heart is prominent with pulmonary vascularity within normal limits. Pacemaker leads are attached to the right heart. No adenopathy.  IMPRESSION: New patchy infiltrate right upper lobe. Less interstitial edema compared to earlier in the day. Underlying emphysema.   Electronically Signed   By: Lowella Grip M.D.   On: 06/18/2014 18:56   Dg Abd 1 View  06/18/2014   CLINICAL DATA:  Nausea and vomiting today. Some abdominal distension also.  EXAM: ABDOMEN - 1 VIEW  COMPARISON:  CT, 06/11/2014  FINDINGS: Mildly distended loops of small bowel are noted in the left central abdomen. And mild gas distention of the right and transverse colon. Air is seen in a normal caliber descending  colon and within the rectum.  There are surgical vascular clips in the central abdomen. Clips from a prior cholecystectomy are also noted.  Aortoiliac vascular calcifications as well as common femoral artery vascular calcifications also noted. Soft tissues otherwise unremarkable.  IMPRESSION: 1. Mild distention of loops of small bowel and portions of the colon. Findings suggest an adynamic ileus. Partial small bowel obstruction is possible but felt less likely. No other evidence of an acute abnormality.   Electronically Signed   By: Lajean Manes M.D.   On: 06/18/2014 18:56   Dg Chest Port 1 View  06/18/2014   CLINICAL DATA:  Hypotension and hypoxia  EXAM: PORTABLE CHEST - 1 VIEW  COMPARISON:  June 06, 2014  FINDINGS: There is underlying emphysematous change. There is generalized interstitial prominence consistent with edema. There is patchy airspace consolidation in the left base medially. Heart is upper normal in size with pulmonary vascularity within normal limits. No adenopathy. Pacemaker leads are attached to the right atrium and right ventricle. Patient is  status post coronary artery bypass grafting. There is a total shoulder replacement on the right. There are surgical clips in the neck bilaterally.  IMPRESSION: Findings consistent with a degree of congestive heart failure superimposed on emphysematous change. Patchy airspace consolidation medial left base, likely representing pneumonia.   Electronically Signed   By: Lowella Grip M.D.   On: 06/18/2014 17:05    Scheduled Meds: . amiodarone  200 mg Oral Daily  . feeding supplement (ENSURE COMPLETE)  237 mL Oral BID BM  . levalbuterol  1.25 mg Nebulization 3 times per day  . mometasone-formoterol  2 puff Inhalation Q12H  . piperacillin-tazobactam (ZOSYN)  IV  3.375 g Intravenous Q8H  . polyethylene glycol  17 g Oral QPC supper  . rosuvastatin  20 mg Oral Daily  . tamsulosin  0.4 mg Oral QHS  . temazepam  15 mg Oral QHS  . tiotropium  18  mcg Inhalation Daily  . vancomycin  750 mg Intravenous Q24H   Continuous Infusions: . sodium chloride 75 mL/hr at 06/19/14 1752  . sodium chloride irrigation      Active Problems:   Cancer of bladder wall   Protein-calorie malnutrition, severe   Hypotension   PNA (pneumonia)    Time spent: 30 minutes.    Barton Dubois  [facility] Pager [phone]. If 7PM-7AM, please contact night-coverage at [email or web address], password [facility] 06/20/2014, 9:58 AM  LOS: 4 days

## 2014-06-20 NOTE — Progress Notes (Signed)
Bladder scanner completed. Noted 187 ml per scan. Patient c/o bladder spasm, will treat with, prn medications.

## 2014-06-20 NOTE — Progress Notes (Signed)
[place] for Vancomycin, Zosyn Indication: HCAP  Allergies  Allergen Reactions  . Ace Inhibitors Cough  . Clarithromycin Other (See Comments)    Strange thoughts and fell with biaxin  . Codeine Nausea And Vomiting  . Oxycodone-Acetaminophen Other (See Comments)    Felt closed in    Patient Measurements: Height: 5\' 10"  (177.8 cm) Weight: 169 lb (76.658 kg) IBW/kg (Calculated) : 73  Vital Signs: Temp: 97.4 F (36.3 C) (01/22 0457) Temp Source: Oral (01/22 0457) BP: 105/52 mmHg (01/22 0457) Pulse Rate: 70 (01/22 0457) Intake/Output from previous day: 01/21 0701 - 01/22 0700 In: [place] [P.O.:480; I.V.:755; IV Piggyback:300] Out: 1650 [Urine:1650] Intake/Output from this shift:    Labs:  Recent Labs  06/18/14 1650 06/18/14 1708 06/19/14 0820 06/20/14 0542  WBC 10.7*  --   --  8.4  HGB 9.6*  --   --  8.8*  PLT 194  --   --  221  CREATININE  --  2.17* 2.12* 1.92*   Estimated Creatinine Clearance: 30.6 mL/min (by C-G formula based on Cr of 1.92). No results for input(s): VANCOTROUGH, VANCOPEAK, VANCORANDOM, GENTTROUGH, GENTPEAK, GENTRANDOM, TOBRATROUGH, TOBRAPEAK, TOBRARND, AMIKACINPEAK, AMIKACINTROU, AMIKACIN in the last 72 hours.   Microbiology: Recent Results (from the past 720 hour(s))  Culture, blood (routine x 2)     Status: None (Preliminary result)   Collection Time: 06/18/14  8:33 PM  Result Value Ref Range Status   Specimen Description BLOOD LEFT ARM  Final   Special Requests BOTTLES DRAWN AEROBIC AND ANAEROBIC 10CC  Final   Culture   Final           BLOOD CULTURE RECEIVED NO GROWTH TO DATE CULTURE WILL BE HELD FOR 5 DAYS BEFORE ISSUING A FINAL NEGATIVE REPORT Performed at [facility]    Report Status PENDING  Incomplete  Culture, blood (routine x 2)     Status: None (Preliminary result)   Collection Time: 06/18/14  8:38 PM  Result Value Ref Range Status   Specimen Description BLOOD LEFT ARM  Final   Special  Requests BOTTLES DRAWN AEROBIC ONLY 5 CC  Final   Culture   Final           BLOOD CULTURE RECEIVED NO GROWTH TO DATE CULTURE WILL BE HELD FOR 5 DAYS BEFORE ISSUING A FINAL NEGATIVE REPORT Performed at [facility]    Report Status PENDING  Incomplete    Assessment: 81 yo male admitted 1/18 for management of bladder tumors, TURBT and mitomycin. Discharge planned 1/20 then cancelled as patient found to be hypoxic and hypotensive. Pharmacy is consulted to dose vancomycin and zosyn for presumed HCAP.  1/18 >> Keflex >> 1/20 1/20 >> Vanc >> 1/20 >> Zosyn >>  Tmax: afeb WBC: WNL Renal: 1.92 (improved), CrCl 83ml/min(C-G and normalized)  1/20 blood x 2: NGTD  Goal of Therapy:  Vancomycin trough level 15-20 mcg/ml  Zosyn dose per renal function  Plan:  Day #2 vancomycin/zosyn Continue Zosyn 3.375gm IV q8h (4hr extended infusions) Continue Vancomycin 750mg  IV q24h - Scr has improved some Check trough at steady state - hold off on trough for now as possibly going home tomorrow if stable Follow up renal function & cultures, clinical course  Doreene Eland, PharmD, BCPS.   Pager: [phone] 06/20/2014,11:19 AM

## 2014-06-21 DIAGNOSIS — N189 Chronic kidney disease, unspecified: Secondary | ICD-10-CM

## 2014-06-21 DIAGNOSIS — N179 Acute kidney failure, unspecified: Secondary | ICD-10-CM

## 2014-06-21 DIAGNOSIS — I5032 Chronic diastolic (congestive) heart failure: Secondary | ICD-10-CM

## 2014-06-21 DIAGNOSIS — E871 Hypo-osmolality and hyponatremia: Secondary | ICD-10-CM

## 2014-06-21 DIAGNOSIS — R410 Disorientation, unspecified: Secondary | ICD-10-CM

## 2014-06-21 LAB — CBC
HCT: 29.9 % — ABNORMAL LOW (ref 39.0–52.0)
Hemoglobin: 9.4 g/dL — ABNORMAL LOW (ref 13.0–17.0)
MCH: 29.7 pg (ref 26.0–34.0)
MCHC: 31.4 g/dL (ref 30.0–36.0)
MCV: 94.6 fL (ref 78.0–100.0)
PLATELETS: 243 10*3/uL (ref 150–400)
RBC: 3.16 MIL/uL — ABNORMAL LOW (ref 4.22–5.81)
RDW: 14.1 % (ref 11.5–15.5)
WBC: 10.9 10*3/uL — AB (ref 4.0–10.5)

## 2014-06-21 LAB — BASIC METABOLIC PANEL
ANION GAP: 7 (ref 5–15)
BUN: 34 mg/dL — ABNORMAL HIGH (ref 6–23)
CALCIUM: 8.3 mg/dL — AB (ref 8.4–10.5)
CO2: 23 mmol/L (ref 19–32)
Chloride: 103 mmol/L (ref 96–112)
Creatinine, Ser: 1.92 mg/dL — ABNORMAL HIGH (ref 0.50–1.35)
GFR calc Af Amer: 36 mL/min — ABNORMAL LOW (ref >90)
GFR calc non Af Amer: 31 mL/min — ABNORMAL LOW (ref >90)
Glucose, Bld: 93 mg/dL (ref 70–99)
Potassium: 4.9 mmol/L (ref 3.5–5.1)
Sodium: 133 mmol/L — ABNORMAL LOW (ref 135–145)

## 2014-06-21 MED ORDER — TEMAZEPAM 15 MG PO CAPS
15.0000 mg | ORAL_CAPSULE | Freq: Every evening | ORAL | Status: DC | PRN
  Administered 2014-06-22 – 2014-06-27 (×5): 15 mg via ORAL
  Filled 2014-06-21 (×5): qty 1

## 2014-06-21 MED ORDER — ACETAMINOPHEN 500 MG PO TABS
1000.0000 mg | ORAL_TABLET | Freq: Three times a day (TID) | ORAL | Status: DC
  Administered 2014-06-21 – 2014-06-28 (×20): 1000 mg via ORAL
  Filled 2014-06-21 (×26): qty 2

## 2014-06-21 MED ORDER — BELLADONNA ALKALOIDS-OPIUM 16.2-60 MG RE SUPP: 1.0000 | Freq: Three times a day (TID) | RECTAL | Status: DC | PRN

## 2014-06-21 MED ORDER — HYDROMORPHONE HCL 1 MG/ML IJ SOLN
0.5000 mg | Freq: Four times a day (QID) | INTRAMUSCULAR | Status: DC | PRN
  Administered 2014-06-22: 0.5 mg via INTRAVENOUS
  Filled 2014-06-21: qty 1

## 2014-06-21 MED ORDER — BELLADONNA ALKALOIDS-OPIUM 16.2-60 MG RE SUPP
1.0000 | Freq: Two times a day (BID) | RECTAL | Status: DC
  Administered 2014-06-21 (×2): 1 via RECTAL
  Filled 2014-06-21 (×2): qty 1

## 2014-06-21 NOTE — Progress Notes (Signed)
[facility] PROGRESS NOTE  Edward [name] Fine YQM:[id number] DOB: June 24, 1929 DOA: 06/16/2014 PCP: Gwendolyn Grant, MD  Assessment/Plan: 1-Acute hypoxic Respiratory Failure in the setting of HCAP  -Improving. Continue with IV antibiotics X another 24 hours.  -continue Mucinex, nebulizer treatments.   2-Chronic combined systolic/diastolic HF; appears compensated.  -Monitor respiratory status on IV fluids.  -Hold lasix due to hypotension and renal failure.  -no ACE/ARB due to renal failure with recent AKI -follow daily weight  3-Acute Renal Failure;  -Baseline creatinine around 1.6-1.9 at baseline -after IVF's, Cr remains at 1.92 (very close to baseline) -Avoid hypotension, continue holding nephrotoxic agents.  -Continue with IV fluids at reduce rate.    4-Atrial fibrillation status post Ablation , status post pacemaker patient to continue with amiodarone  hold anticoagulation until hematuria resolves completely (urology to help deciding when is safe to anticoagulate again)   5-ileus:improving clinically. Has had another BM overnight. Will continue diet as tolerated 6-hyponatremia: improved/stable; will monitor. Continue gentle hydration 7-hematuria; post surgery and in setting anticoagulation. Repeat hb 9.4; will monitor. No transfusion needed 8-mild elevated troponin. In setting renal failure. ekg with ventricular paced rhythm. No CP.  9-delirium: hospital acquired, PNA, use of narcotics and inability to sleep/rest with ongoing bladder spasm and pain. -will minimize narcotics; start schedule tylenol and will follow urology rec's for bladder spasms and retention  Code Status: full code.  Family Communication: care discussed with son, who was at bedside.  Disposition Plan: per primary service; continue IV antibiotics X another 24 hours; start schedule tylenol; discontinue narcotics..   Procedures - Left ventricle: The cavity size was normal. There was  moderate concentric hypertrophy. Systolic function was mildly to moderately reduced. The estimated ejection fraction was in the range of 40% to 45%. Moderate hypokinesis of the apical myocardium. Mild hypokinesis of the inferior myocardium. Features are consistent with a pseudonormal left ventricular filling pattern, with concomitant abnormal relaxation and increased filling pressure (grade 2 diastolic dysfunction). - Aortic valve: Valve mobility was mildly restricted. There was mild to moderate stenosis. There was mild regurgitation. Valve area (VTI): 1.23 cm^2. Valve area (Vmax): 0.93 cm^2. Valve area (Vmean): 0.9 cm^2. - Mitral valve: Mildly to moderately calcified annulus. There was moderate regurgitation. - Left atrium: The atrium was mildly to moderately dilated. - Right atrium: The atrium was mildly dilated. - Pulmonary arteries: Systolic pressure was moderately increased. PA peak pressure: 55 mm Hg (S).  Antibiotics:  Vancomycin 1-20  Zosyn 1-20  HPI/Subjective: No fever; breathing is stable. Patient with complaints of pain in his penis and difficulty passing urine; family reported increase in use of pain meds and slight delirium  Objective: Filed Vitals:   06/21/14 0603  BP: 123/89  Pulse: 73  Temp: 97.6 F (36.4 C)  Resp: 20    Intake/Output Summary (Last 24 hours) at 06/21/14 1307 Last data filed at 06/21/14 0700  Gross per 24 hour  Intake    150 ml  Output    720 ml  Net   -570 ml   Filed Weights   06/16/14 0800 06/21/14 0603  Weight: 76.658 kg (169 lb) 81.874 kg (180 lb 8 oz)    Exam:   General:  Alert, but with difficulty following commands. Family reported patient had a rough night (given bladders spasm, pain and difficulty passing urine); he has become slightly delirious and has been receiving to much pain meds.   Cardiovascular: S 1, S 2 RRR  Respiratory: scattered rhonchi, no wheezing; no crackles  Abdomen:  bs  present, distended. No rigidity  Musculoskeletal: scd's in place.    Data Reviewed: Basic Metabolic Panel:  Recent Labs Lab 06/18/14 1708 06/19/14 0820 06/20/14 0542 06/21/14 0833  NA 129* 129* 135 133*  K 4.4 4.5 4.4 4.9  CL 98 99 103 103  CO2 24 23 24 23   GLUCOSE 119* 107* 113* 93  BUN 41* 40* 36* 34*  CREATININE 2.17* 2.12* 1.92* 1.92*  CALCIUM 8.2* 7.9* 8.2* 8.3*   Liver Function Tests:  Recent Labs Lab 06/18/14 1708  AST 34  ALT 17  ALKPHOS 92  BILITOT 1.1  PROT 5.4*  ALBUMIN 2.6*   CBC:  Recent Labs Lab 06/18/14 1650 06/20/14 0542 06/21/14 0833  WBC 10.7* 8.4 10.9*  HGB 9.6* 8.8* 9.4*  HCT 29.6* 27.7* 29.9*  MCV 92.8 94.5 94.6  PLT 194 221 243   Cardiac Enzymes:  Recent Labs Lab 06/18/14 1708 06/18/14 2245 06/19/14 0432  TROPONINI 0.05* 0.04* 0.04*   CBG:  Recent Labs Lab 06/16/14 2225  GLUCAP 95    Recent Results (from the past 240 hour(s))  Culture, blood (routine x 2)     Status: None (Preliminary result)   Collection Time: 06/18/14  8:33 PM  Result Value Ref Range Status   Specimen Description BLOOD LEFT ARM  Final   Special Requests BOTTLES DRAWN AEROBIC AND ANAEROBIC 10CC  Final   Culture   Final           BLOOD CULTURE RECEIVED NO GROWTH TO DATE CULTURE WILL BE HELD FOR 5 DAYS BEFORE ISSUING A FINAL NEGATIVE REPORT Performed at [facility]    Report Status PENDING  Incomplete  Culture, blood (routine x 2)     Status: None (Preliminary result)   Collection Time: 06/18/14  8:38 PM  Result Value Ref Range Status   Specimen Description BLOOD LEFT ARM  Final   Special Requests BOTTLES DRAWN AEROBIC ONLY 5 CC  Final   Culture   Final           BLOOD CULTURE RECEIVED NO GROWTH TO DATE CULTURE WILL BE HELD FOR 5 DAYS BEFORE ISSUING A FINAL NEGATIVE REPORT Performed at [facility]    Report Status PENDING  Incomplete     Studies: No results found.  Scheduled Meds: . amiodarone  200 mg Oral Daily  .  budesonide (PULMICORT) nebulizer solution  0.25 mg Nebulization BID  . feeding supplement (ENSURE COMPLETE)  237 mL Oral BID BM  . levalbuterol  1.25 mg Nebulization 3 times per day  . opium-belladonna  1 suppository Rectal Q12H  . piperacillin-tazobactam (ZOSYN)  IV  3.375 g Intravenous Q8H  . polyethylene glycol  17 g Oral QPC supper  . rosuvastatin  20 mg Oral Daily  . tamsulosin  0.4 mg Oral QHS  . temazepam  15 mg Oral QHS  . tiotropium  18 mcg Inhalation Daily  . vancomycin  750 mg Intravenous Q24H   Continuous Infusions: . sodium chloride 50 mL/hr at 06/21/14 1100  . sodium chloride irrigation      Active Problems:   Cancer of bladder wall   Protein-calorie malnutrition, severe   Hypotension   PNA (pneumonia)    Time spent: 30 minutes.    Barton Dubois  [facility] Pager [phone]. If 7PM-7AM, please contact night-coverage at [email or web address], password [facility] 06/21/2014, 1:07 PM  LOS: 5 days

## 2014-06-21 NOTE — Progress Notes (Signed)
5 Days Post-Op Subjective: Patient reports persistent bladder spasms, suprapubic/penile pain, and urinary retention. I&O catheted for 600cc by RNs with return of amber colored urine without clots. He lets his son answer questions for him this morning. The son reports that he is getting worse. He is worried about his urination. His breathing is at baseline. He's getting weak because he's in bed all day.  Objective: Vital signs in last 24 hours: Temp:  [97.4 F (36.3 C)-98.9 F (37.2 C)] 97.6 F (36.4 C) (01/23 0603) Pulse Rate:  [69-73] 73 (01/23 0603) Resp:  [20] 20 (01/23 0603) BP: (113-123)/(57-89) 123/89 mmHg (01/23 0603) SpO2:  [91 %-100 %] 91 % (01/23 0801) Weight:  [81.874 kg (180 lb 8 oz)] 81.874 kg (180 lb 8 oz) (01/23 0603)  Intake/Output from previous day: 01/22 0701 - 01/23 0700 In: 390 [P.O.:240; IV Piggyback:150] Out: 720 [Urine:720] Intake/Output this shift:    Physical Exam:  Constitutional: Vital signs reviewed. WD WN in NAD   Eyes: PERRL, No scleral icterus.   Cardiovascular: RRR Pulmonary/Chest: Normal effort  No urine kept for my exam.  Lab Results:  Recent Labs  06/18/14 1650 06/20/14 0542 06/21/14 0833  HGB 9.6* 8.8* 9.4*  HCT 29.6* 27.7* 29.9*   BMET  Recent Labs  06/19/14 0820 06/20/14 0542  NA 129* 135  K 4.5 4.4  CL 99 103  CO2 23 24  GLUCOSE 107* 113*  BUN 40* 36*  CREATININE 2.12* 1.92*  CALCIUM 7.9* 8.2*    Recent Labs  06/18/14 1708 06/20/14 0542  INR 1.91* 2.06*   No results for input(s): LABURIN in the last 72 hours. Results for orders placed or performed during the hospital encounter of 06/16/14  Culture, blood (routine x 2)     Status: None (Preliminary result)   Collection Time: 06/18/14  8:33 PM  Result Value Ref Range Status   Specimen Description BLOOD LEFT ARM  Final   Special Requests BOTTLES DRAWN AEROBIC AND ANAEROBIC 10CC  Final   Culture   Final           BLOOD CULTURE RECEIVED NO GROWTH TO DATE CULTURE  WILL BE HELD FOR 5 DAYS BEFORE ISSUING A FINAL NEGATIVE REPORT Performed at [facility]    Report Status PENDING  Incomplete  Culture, blood (routine x 2)     Status: None (Preliminary result)   Collection Time: 06/18/14  8:38 PM  Result Value Ref Range Status   Specimen Description BLOOD LEFT ARM  Final   Special Requests BOTTLES DRAWN AEROBIC ONLY 5 CC  Final   Culture   Final           BLOOD CULTURE RECEIVED NO GROWTH TO DATE CULTURE WILL BE HELD FOR 5 DAYS BEFORE ISSUING A FINAL NEGATIVE REPORT Performed at [facility]    Report Status PENDING  Incomplete    Studies/Results: No results found.  Assessment/Plan:   Postoperative day #5 TURBT of large bladder tumor. Now with bladder spasms/urinary retention/delirium on top of pneumonia.    Patient and family greatly concerned about suprapubic pain and urinary retention. Very resistant to the idea of foley catheter, however. We will schedule B&O suppositories and schedule CIC. Hopefully B&O suppositories will be less deliriogenic than the ditropan. If this improves his pelvic symptoms, hopefully he will sleep better and his delirium will improve.   LOS: 5 days   Margo Aye 06/21/2014, 9:22 AM

## 2014-06-21 NOTE — Progress Notes (Signed)
Pt felt the need to void while being bladder scanned (only showed 70 ml), but after sitting on side of bed with urinal for 10 minutes patient was unable to void and began to become more short of breath. I returned the patient to 30 degree supine angle and catheterized the patient. 250 cc of tea colored urine with small blood clots was noted. Pt reported decreased abdominal pressure.Pt was bladder scanned again and it showed 0 ml. Pt then received Flomax, B&O suppository, and tylenol to decrease spasms and pain. Pt tolerated all well.

## 2014-06-22 DIAGNOSIS — M7989 Other specified soft tissue disorders: Secondary | ICD-10-CM

## 2014-06-22 DIAGNOSIS — I9589 Other hypotension: Secondary | ICD-10-CM

## 2014-06-22 DIAGNOSIS — R0602 Shortness of breath: Secondary | ICD-10-CM

## 2014-06-22 MED ORDER — AMOXICILLIN-POT CLAVULANATE 500-125 MG PO TABS
1.0000 | ORAL_TABLET | Freq: Three times a day (TID) | ORAL | Status: DC
  Administered 2014-06-22 – 2014-06-28 (×18): 500 mg via ORAL
  Filled 2014-06-22 (×21): qty 1

## 2014-06-22 MED ORDER — LEVALBUTEROL HCL 0.63 MG/3ML IN NEBU
0.6300 mg | INHALATION_SOLUTION | Freq: Three times a day (TID) | RESPIRATORY_TRACT | Status: DC | PRN
  Administered 2014-06-22 – 2014-06-27 (×3): 0.63 mg via RESPIRATORY_TRACT
  Filled 2014-06-22 (×3): qty 3

## 2014-06-22 MED ORDER — LEVALBUTEROL HCL 1.25 MG/0.5ML IN NEBU
1.2500 mg | INHALATION_SOLUTION | Freq: Four times a day (QID) | RESPIRATORY_TRACT | Status: DC
  Administered 2014-06-22 – 2014-06-27 (×19): 1.25 mg via RESPIRATORY_TRACT
  Filled 2014-06-22 (×26): qty 0.5

## 2014-06-22 MED ORDER — SENNA 8.6 MG PO TABS
1.0000 | ORAL_TABLET | Freq: Every day | ORAL | Status: DC
  Administered 2014-06-22 – 2014-06-27 (×3): 8.6 mg via ORAL
  Filled 2014-06-22 (×7): qty 1

## 2014-06-22 MED ORDER — LIDOCAINE HCL 2 % EX GEL
1.0000 "application " | Freq: Three times a day (TID) | CUTANEOUS | Status: DC | PRN
  Administered 2014-06-23: 1 via URETHRAL
  Filled 2014-06-22 (×2): qty 5

## 2014-06-22 MED ORDER — DOCUSATE SODIUM 100 MG PO CAPS
100.0000 mg | ORAL_CAPSULE | Freq: Two times a day (BID) | ORAL | Status: DC
Start: 2014-06-22 — End: 2014-06-28
  Administered 2014-06-22 – 2014-06-27 (×7): 100 mg via ORAL
  Filled 2014-06-22 (×14): qty 1

## 2014-06-22 MED ORDER — LEVALBUTEROL HCL 0.63 MG/3ML IN NEBU: 0.6300 mg | INHALATION_SOLUTION | Freq: Four times a day (QID) | RESPIRATORY_TRACT | Status: DC | PRN

## 2014-06-22 NOTE — Progress Notes (Signed)
Left upper extremity venous duplex completed:  No evidence of DVT or superficial thrombosis.    

## 2014-06-22 NOTE — Progress Notes (Signed)
6 Days Post-Op Subjective: Yesterday with q8hr CIC, he did better than the day before with less suprapubic discomfort. He actually voided once with BM with low residual. Urine remains amber colored per report. However, he was less active yesterday and basically slept all day. He didn't eat anything due to drowsiness. He lets his son answer questions for him this morning. The son reports that his breathing is better. He's getting weak because he's in bed all day.  Objective: Vital signs in last 24 hours: Temp:  [98.1 F (36.7 C)-98.4 F (36.9 C)] 98.2 F (36.8 C) (01/24 0517) Pulse Rate:  [72-81] 81 (01/24 0517) Resp:  [16-18] 16 (01/24 0517) BP: (95-128)/(54-69) 100/69 mmHg (01/24 0517) SpO2:  [90 %-95 %] 91 % (01/24 0517) Weight:  [81.693 kg (180 lb 1.6 oz)] 81.693 kg (180 lb 1.6 oz) (01/24 0517)  Intake/Output from previous day: 01/23 0701 - 01/24 0700 In: 1600 [I.V.:1150; IV Piggyback:450] Out: 1050 [Urine:1050] Intake/Output this shift:    Physical Exam:  Constitutional: Vital signs reviewed. WD WN in NAD   Eyes: PERRL, No scleral icterus.   Cardiovascular: RRR Pulmonary/Chest: Normal effort Abd: soft, distended. + BS  No urine kept for my exam.  Lab Results:  Recent Labs  06/20/14 0542 06/21/14 0833  HGB 8.8* 9.4*  HCT 27.7* 29.9*   BMET  Recent Labs  06/20/14 0542 06/21/14 0833  NA 135 133*  K 4.4 4.9  CL 103 103  CO2 24 23  GLUCOSE 113* 93  BUN 36* 34*  CREATININE 1.92* 1.92*  CALCIUM 8.2* 8.3*    Recent Labs  06/20/14 0542  INR 2.06*   No results for input(s): LABURIN in the last 72 hours. Results for orders placed or performed during the hospital encounter of 06/16/14  Culture, blood (routine x 2)     Status: None (Preliminary result)   Collection Time: 06/18/14  8:33 PM  Result Value Ref Range Status   Specimen Description BLOOD LEFT ARM  Final   Special Requests BOTTLES DRAWN AEROBIC AND ANAEROBIC 10CC  Final   Culture   Final   BLOOD CULTURE RECEIVED NO GROWTH TO DATE CULTURE WILL BE HELD FOR 5 DAYS BEFORE ISSUING A FINAL NEGATIVE REPORT Performed at [facility]    Report Status PENDING  Incomplete  Culture, blood (routine x 2)     Status: None (Preliminary result)   Collection Time: 06/18/14  8:38 PM  Result Value Ref Range Status   Specimen Description BLOOD LEFT ARM  Final   Special Requests BOTTLES DRAWN AEROBIC ONLY 5 CC  Final   Culture   Final           BLOOD CULTURE RECEIVED NO GROWTH TO DATE CULTURE WILL BE HELD FOR 5 DAYS BEFORE ISSUING A FINAL NEGATIVE REPORT Performed at [facility]    Report Status PENDING  Incomplete    Studies/Results: No results found.  Assessment/Plan:   Postoperative day #6 TURBT of large bladder tumor. Now with bladder spasms/urinary retention/delirium on top of pneumonia.    Patient and family greatly concerned about suprapubic pain and urinary retention. Very resistant to the idea of foley catheter, however. Pt and son would like to continue with q8hr CIC and PRN. Given delirium and offset sleep/wake cycles, will try holding B&O suppositories today. Our alogrithm for suprapubic pain will be: CIC, if no improvement: antispasmodics, if no improvement: narcotics. I suspect CIC should resolve it every time.  He is also struggling with constipation. Will schedule  colace and senna. Will discontinue telemetry per medicine. Appreciate recommendations.  Hopefully if his left UE swelling is due to a peripheral clot, we can treat with warm compresses and avoid systemic treatment dose anticoagulation.   LOS: 6 days   Margo Aye 06/22/2014, 9:19 AM

## 2014-06-22 NOTE — Progress Notes (Addendum)
[facility] PROGRESS NOTE  Edward [name] OZH:[id number] DOB: January 25, 1930 DOA: 06/16/2014 PCP: Gwendolyn Grant, MD  Assessment/Plan: 1-Acute hypoxic Respiratory Failure in the setting of HCAP  -Improving. Will switch to Augmentin -start flutter valve along with ICS -continue Mucinex, nebulizer treatments. -wean oxygen off as tolerated.   2-Chronic combined systolic/diastolic HF; appears compensated.  -Monitor respiratory status on IV fluids.  -continue holding lasix due to hypotension and recent renal failure.  -no ACE/ARB due to renal failure with recent AKI -follow daily weight  3-Acute Renal Failure;  -Baseline creatinine around 1.6-1.9 at baseline -after IVF's, Cr remains at 1.92 (essentially at baseline) -Avoid hypotension, continue holding nephrotoxic agents.  -Continue with IV fluids at reduce rate.   4-Atrial fibrillation status post Ablation , status post pacemaker patient to continue with amiodarone  hold anticoagulation until hematuria resolves completely (urology to help deciding when is safe to anticoagulate again)   5-ileus:improving clinically. Has had another small BM this morning. Will continue diet as tolerated  6-hyponatremia: improved/stable; will monitor. Continue gentle hydration and encourage PO intake.  7-hematuria; post surgery and in setting anticoagulation. Repeat hgb stable and no need for transfusion currently. Will monitor.   8-mild elevated troponin. In setting renal failure. ekg with ventricular paced rhythm. No CP. Will discontinue tele  9-delirium: hospital acquired, PNA, use of narcotics and inability to sleep/rest with ongoing bladder spasm and pain. -will continue minimizing narcotics; start schedule tylenol and will follow urology rec's for bladder spasms and retention  10-left upper extremity: will check doppler to r/o DVT. Keep arm elevated.  Code Status: full code.  Family Communication: care discussed with son, who was at  bedside.  Disposition Plan: per primary service; continue IV antibiotics X another 24 hours; start schedule tylenol; discontinue narcotics..   Procedures - Left ventricle: The cavity size was normal. There was moderate concentric hypertrophy. Systolic function was mildly to moderately reduced. The estimated ejection fraction was in the range of 40% to 45%. Moderate hypokinesis of the apical myocardium. Mild hypokinesis of the inferior myocardium. Features are consistent with a pseudonormal left ventricular filling pattern, with concomitant abnormal relaxation and increased filling pressure (grade 2 diastolic dysfunction). - Aortic valve: Valve mobility was mildly restricted. There was mild to moderate stenosis. There was mild regurgitation. Valve area (VTI): 1.23 cm^2. Valve area (Vmax): 0.93 cm^2. Valve area (Vmean): 0.9 cm^2. - Mitral valve: Mildly to moderately calcified annulus. There was moderate regurgitation. - Left atrium: The atrium was mildly to moderately dilated. - Right atrium: The atrium was mildly dilated. - Pulmonary arteries: Systolic pressure was moderately increased. PA peak pressure: 55 mm Hg (S).  Antibiotics:  Vancomycin 1-20>>1/24  Zosyn 1-20>>1/24  Augmentin 1/24  HPI/Subjective: No fever; breathing is stable. Continue to have difficulty passing urine and bladder spasm. Patient is delirious   Objective: Filed Vitals:   06/22/14 0517  BP: 100/69  Pulse: 81  Temp: 98.2 F (36.8 C)  Resp: 16    Intake/Output Summary (Last 24 hours) at 06/22/14 0926 Last data filed at 06/22/14 0600  Gross per 24 hour  Intake   1600 ml  Output   1050 ml  Net    550 ml   Filed Weights   06/16/14 0800 06/21/14 0603 06/22/14 0517  Weight: 76.658 kg (169 lb) 81.874 kg (180 lb 8 oz) 81.693 kg (180 lb 1.6 oz)    Exam:   General:  Alert, but with difficulty following commands. Patient continue experiencing delirium. Continue to have  bladders spasm,  pain and difficulty passing urine.   Cardiovascular: S 1, S 2 RRR  Respiratory: scattered rhonchi, no wheezing; decrease BS at the bases, frank no crackles appreciated  Abdomen: bs present, less distension. No rigidity  Musculoskeletal: trace to 1+ edema bilaterally; scd's in place.  Swelling appreciated on Left upper extremity; no cyanosis   Data Reviewed: Basic Metabolic Panel:  Recent Labs Lab 06/18/14 1708 06/19/14 0820 06/20/14 0542 06/21/14 0833  NA 129* 129* 135 133*  K 4.4 4.5 4.4 4.9  CL 98 99 103 103  CO2 24 23 24 23   GLUCOSE 119* 107* 113* 93  BUN 41* 40* 36* 34*  CREATININE 2.17* 2.12* 1.92* 1.92*  CALCIUM 8.2* 7.9* 8.2* 8.3*   Liver Function Tests:  Recent Labs Lab 06/18/14 1708  AST 34  ALT 17  ALKPHOS 92  BILITOT 1.1  PROT 5.4*  ALBUMIN 2.6*   CBC:  Recent Labs Lab 06/18/14 1650 06/20/14 0542 06/21/14 0833  WBC 10.7* 8.4 10.9*  HGB 9.6* 8.8* 9.4*  HCT 29.6* 27.7* 29.9*  MCV 92.8 94.5 94.6  PLT 194 221 243   Cardiac Enzymes:  Recent Labs Lab 06/18/14 1708 06/18/14 2245 06/19/14 0432  TROPONINI 0.05* 0.04* 0.04*   CBG:  Recent Labs Lab 06/16/14 2225  GLUCAP 95    Recent Results (from the past 240 hour(s))  Culture, blood (routine x 2)     Status: None (Preliminary result)   Collection Time: 06/18/14  8:33 PM  Result Value Ref Range Status   Specimen Description BLOOD LEFT ARM  Final   Special Requests BOTTLES DRAWN AEROBIC AND ANAEROBIC 10CC  Final   Culture   Final           BLOOD CULTURE RECEIVED NO GROWTH TO DATE CULTURE WILL BE HELD FOR 5 DAYS BEFORE ISSUING A FINAL NEGATIVE REPORT Performed at [facility]    Report Status PENDING  Incomplete  Culture, blood (routine x 2)     Status: None (Preliminary result)   Collection Time: 06/18/14  8:38 PM  Result Value Ref Range Status   Specimen Description BLOOD LEFT ARM  Final   Special Requests BOTTLES DRAWN AEROBIC ONLY 5 CC  Final   Culture    Final           BLOOD CULTURE RECEIVED NO GROWTH TO DATE CULTURE WILL BE HELD FOR 5 DAYS BEFORE ISSUING A FINAL NEGATIVE REPORT Performed at [facility]    Report Status PENDING  Incomplete     Studies: No results found.  Scheduled Meds: . acetaminophen  1,000 mg Oral 3 times per day  . amiodarone  200 mg Oral Daily  . amoxicillin-clavulanate  1 tablet Oral TID  . budesonide (PULMICORT) nebulizer solution  0.25 mg Nebulization BID  . docusate sodium  100 mg Oral BID  . feeding supplement (ENSURE COMPLETE)  237 mL Oral BID BM  . levalbuterol  1.25 mg Nebulization Q6H WA  . polyethylene glycol  17 g Oral QPC supper  . rosuvastatin  20 mg Oral Daily  . senna  1 tablet Oral Daily  . tamsulosin  0.4 mg Oral QHS  . tiotropium  18 mcg Inhalation Daily   Continuous Infusions:    Active Problems:   Cancer of bladder wall   Protein-calorie malnutrition, severe   Hypotension   PNA (pneumonia)    Time spent: 30 minutes.    Barton Dubois  [facility] Pager [phone]. If 7PM-7AM, please contact night-coverage at [email or web address], password [facility]  06/22/2014, 9:26 AM  LOS: 6 days

## 2014-06-22 NOTE — Progress Notes (Signed)
Pt ambulated to toilet with x 2 assist and sat for one hour. He had a small BM and urinated. Bladder scan after urinating was 0 ml. Pt sitting up in chair. Will continue to monitor.

## 2014-06-23 ENCOUNTER — Inpatient Hospital Stay (HOSPITAL_COMMUNITY): Payer: Medicare Other

## 2014-06-23 DIAGNOSIS — I482 Chronic atrial fibrillation: Secondary | ICD-10-CM

## 2014-06-23 DIAGNOSIS — E785 Hyperlipidemia, unspecified: Secondary | ICD-10-CM

## 2014-06-23 LAB — BASIC METABOLIC PANEL
Anion gap: 10 (ref 5–15)
BUN: 47 mg/dL — AB (ref 6–23)
CO2: 23 mmol/L (ref 19–32)
CREATININE: 2.1 mg/dL — AB (ref 0.50–1.35)
Calcium: 8.7 mg/dL (ref 8.4–10.5)
Chloride: 104 mmol/L (ref 96–112)
GFR calc Af Amer: 32 mL/min — ABNORMAL LOW (ref >90)
GFR calc non Af Amer: 28 mL/min — ABNORMAL LOW (ref >90)
Glucose, Bld: 107 mg/dL — ABNORMAL HIGH (ref 70–99)
Potassium: 4.5 mmol/L (ref 3.5–5.1)
Sodium: 137 mmol/L (ref 135–145)

## 2014-06-23 LAB — URINALYSIS, ROUTINE W REFLEX MICROSCOPIC
BILIRUBIN URINE: NEGATIVE
GLUCOSE, UA: NEGATIVE mg/dL
Ketones, ur: NEGATIVE mg/dL
NITRITE: NEGATIVE
PROTEIN: 100 mg/dL — AB
SPECIFIC GRAVITY, URINE: 1.018 (ref 1.005–1.030)
UROBILINOGEN UA: 1 mg/dL (ref 0.0–1.0)
pH: 5.5 (ref 5.0–8.0)

## 2014-06-23 LAB — CBC
HCT: 30 % — ABNORMAL LOW (ref 39.0–52.0)
Hemoglobin: 9.5 g/dL — ABNORMAL LOW (ref 13.0–17.0)
MCH: 30 pg (ref 26.0–34.0)
MCHC: 31.7 g/dL (ref 30.0–36.0)
MCV: 94.6 fL (ref 78.0–100.0)
Platelets: 297 10*3/uL (ref 150–400)
RBC: 3.17 MIL/uL — ABNORMAL LOW (ref 4.22–5.81)
RDW: 14 % (ref 11.5–15.5)
WBC: 13.5 10*3/uL — ABNORMAL HIGH (ref 4.0–10.5)

## 2014-06-23 LAB — URINE MICROSCOPIC-ADD ON

## 2014-06-23 MED ORDER — SODIUM CHLORIDE 0.45 % IV SOLN
INTRAVENOUS | Status: DC
  Administered 2014-06-23 (×2): via INTRAVENOUS

## 2014-06-23 NOTE — Progress Notes (Signed)
7 Days Post-Op Subjective: The patient slept better last night, but still had episodes of confusion. He had 2 bowel movements last night. He is not having abdominal pain. He is voiding spontaneously but at times he has had a second residual urine volume. Urine is still pretty dark but without clots.  Objective: Vital signs in last 24 hours: Temp:  [98 F (36.7 C)-98.6 F (37 C)] 98.6 F (37 C) (01/25 0537) Pulse Rate:  [71-90] 71 (01/25 0900) Resp:  [18] 18 (01/25 0537) BP: (117-136)/(54-76) 119/54 mmHg (01/25 0900) SpO2:  [91 %-99 %] 97 % (01/25 0900) Weight:  [83.6 kg (184 lb 4.9 oz)] 83.6 kg (184 lb 4.9 oz) (01/25 0537)  Intake/Output from previous day: 01/24 0701 - 01/25 0700 In: 560 [P.O.:360; I.V.:200] Out: 345 [Urine:345] Intake/Output this shift:    Physical Exam:  Constitutional: Vital signs reviewed. He is responsive and answers appropriately to questions.   Eyes: PERRL, No scleral icterus.   Pulmonary/Chest: Normal effort Abdominal: Soft. Slightly distended but non-tender no masses, organomegaly, or guarding present.  Genitourinary: Extremities: 2-3+ pretibial edema. Left upper extremity edema is less.  Lab Results:  Recent Labs  06/21/14 0833 06/23/14 0450  HGB 9.4* 9.5*  HCT 29.9* 30.0*   BMET  Recent Labs  06/21/14 0833 06/23/14 0450  NA 133* 137  K 4.9 4.5  CL 103 104  CO2 23 23  GLUCOSE 93 107*  BUN 34* 47*  CREATININE 1.92* 2.10*  CALCIUM 8.3* 8.7   No results for input(s): LABPT, INR in the last 72 hours. No results for input(s): LABURIN in the last 72 hours. Results for orders placed or performed during the hospital encounter of 06/16/14  Culture, blood (routine x 2)     Status: None (Preliminary result)   Collection Time: 06/18/14  8:33 PM  Result Value Ref Range Status   Specimen Description BLOOD LEFT ARM  Final   Special Requests BOTTLES DRAWN AEROBIC AND ANAEROBIC 10CC  Final   Culture   Final           BLOOD CULTURE RECEIVED NO  GROWTH TO DATE CULTURE WILL BE HELD FOR 5 DAYS BEFORE ISSUING A FINAL NEGATIVE REPORT Performed at [facility]    Report Status PENDING  Incomplete  Culture, blood (routine x 2)     Status: None (Preliminary result)   Collection Time: 06/18/14  8:38 PM  Result Value Ref Range Status   Specimen Description BLOOD LEFT ARM  Final   Special Requests BOTTLES DRAWN AEROBIC ONLY 5 CC  Final   Culture   Final           BLOOD CULTURE RECEIVED NO GROWTH TO DATE CULTURE WILL BE HELD FOR 5 DAYS BEFORE ISSUING A FINAL NEGATIVE REPORT Performed at [facility]    Report Status PENDING  Incomplete    Studies/Results: No results found.  Assessment/Plan:   Postoperative day #7 from TURBT of large bladder tumor volume. Hematuria is resolving somewhat as his retention. He is on spontaneous voiding with in and out catheterization when necessary.    Pneumonia is appropriately responding to antibiotics.    Creatinine/BUN increased somewhat, most likely due to dehydration as his by mouth intake has not been that great.    I will restart IV fluids, check a renal ultrasound to rule out hydronephrosis, as his bladder neck was involved with bladder tumor. It will be good to press ahead with physical therapy and increasing activity.    I had  a long talk with the patient and his son this morning about the above plan.   LOS: 7 days   Franchot Gallo M 06/23/2014, 10:16 AM

## 2014-06-23 NOTE — Progress Notes (Signed)
[facility] PROGRESS NOTE  Edward [name] ZSW:[id number] DOB: 09-28-1929 DOA: 06/16/2014 PCP: Gwendolyn Grant, MD  Assessment/Plan: 1-Acute hypoxic Respiratory Failure in the setting of HCAP  -Improving. Will continue Augmentin (day 1/5) -start flutter valve along with ICS -continue Mucinex, nebulizer treatments. -wean oxygen off as tolerated.  -will repeat CXR to assess for SOB  2-Chronic combined systolic/diastolic HF; appears compensated.  -Monitor respiratory status on IV fluids.  -continue holding lasix due to hypotension and recent renal failure.  -no ACE/ARB due to renal failure with recent AKI -follow daily weight  3-Acute Renal Failure;  -Baseline creatinine around 1.6-1.9 at baseline -Cr is 2.1 today -Avoid hypotension, continue holding nephrotoxic agents.  -Continue with IV fluids at reduce rate.  -renal US has been ordered by urology to r/o obstruction(which I agree with) -will also check UA/urine cx  4-Atrial fibrillation status post Ablation, status post pacemaker patient to continue with amiodarone  hold anticoagulation until hematuria resolves completely (urology to help deciding when is safe to anticoagulate again)   5-ileus:improving clinically. Has had another small BM this morning. Will continue diet as tolerated  6-hyponatremia: improved/stable; will monitor. Continue gentle hydration while encouraging PO intake.  7-hematuria; post surgery and in setting of anticoagulation. Repeat hgb has remained stable and there is no need for transfusion currently. Will monitor.   8-mild elevated troponin. In setting renal failure. ekg with ventricular paced rhythm. No CP.   9-delirium: multifactorial: hospital acquired, PNA, use of narcotics and inability to sleep/rest with ongoing bladder spasm and pain. -will continue minimizing narcotics; start schedule tylenol and will follow urology rec's for bladder spasms and retention -mentation is better and  overall condition improving. Patient AAOX2 on today exam  10-left upper extremity:  -neg LUE duplex for DVT and/or SVT -continue arm elevation -appears to be secondary to 3rd shift spacing  Code Status: full code.  Family Communication: care discussed with son, who was at bedside.  Disposition Plan: per primary service; continue IV antibiotics X another 24 hours; start schedule tylenol; discontinue narcotics..   Procedures - Left ventricle: The cavity size was normal. There was moderate concentric hypertrophy. Systolic function was mildly to moderately reduced. The estimated ejection fraction was in the range of 40% to 45%. Moderate hypokinesis of the apical myocardium. Mild hypokinesis of the inferior myocardium. Features are consistent with a pseudonormal left ventricular filling pattern, with concomitant abnormal relaxation and increased filling pressure (grade 2 diastolic dysfunction). - Aortic valve: Valve mobility was mildly restricted. There was mild to moderate stenosis. There was mild regurgitation. Valve area (VTI): 1.23 cm^2. Valve area (Vmax): 0.93 cm^2. Valve area (Vmean): 0.9 cm^2. - Mitral valve: Mildly to moderately calcified annulus. There was moderate regurgitation. - Left atrium: The atrium was mildly to moderately dilated. - Right atrium: The atrium was mildly dilated. - Pulmonary arteries: Systolic pressure was moderately increased. PA peak pressure: 55 mm Hg (S).  Antibiotics:  Vancomycin 1-20>>1/24  Zosyn 1-20>>1/24  Augmentin 1/24>>1/29 (plan is to complete 5 more days of tx)  HPI/Subjective: No fever; breathing is stable. Reports improvement in bladder spasm and ability to pass urine. PO intake continue to be challenging according to his son. Mentation is better and LUE swelling is pretty much resolved.   Objective: Filed Vitals:   06/23/14 1338  BP: 99/80  Pulse: 72  Temp: 98.4 F (36.9 C)  Resp: 18     Intake/Output Summary (Last 24 hours) at 06/23/14 1746 Last data filed at 06/23/14 1600  Gross per 24  hour  Intake  812.5 ml  Output    295 ml  Net  517.5 ml   Filed Weights   06/21/14 0603 06/22/14 0517 06/23/14 0537  Weight: 81.874 kg (180 lb 8 oz) 81.693 kg (180 lb 1.6 oz) 83.6 kg (184 lb 4.9 oz)    Exam:   General:  Alert, oriented X2; improve in mentation and feeling better. Still requirement for O2 supplementation. No CP. Generalized weakness. Intermittently emptying bladder and with improvement in bladder spasm and pain.   Cardiovascular: S 1, S 2 RRR  Respiratory: scattered rhonchi, no wheezing; decrease BS at the bases, frank no crackles appreciated  Abdomen: bs present, less distension. No rigidity  Musculoskeletal: trace to 1-2+ edema bilaterally; scd's in place.  Swelling appreciated on Left upper extremity on 1/24, significantly improved with arm elevation   Data Reviewed: Basic Metabolic Panel:  Recent Labs Lab 06/18/14 1708 06/19/14 0820 06/20/14 0542 06/21/14 0833 06/23/14 0450  NA 129* 129* 135 133* 137  K 4.4 4.5 4.4 4.9 4.5  CL 98 99 103 103 104  CO2 24 23 24 23 23   GLUCOSE 119* 107* 113* 93 107*  BUN 41* 40* 36* 34* 47*  CREATININE 2.17* 2.12* 1.92* 1.92* 2.10*  CALCIUM 8.2* 7.9* 8.2* 8.3* 8.7   Liver Function Tests:  Recent Labs Lab 06/18/14 1708  AST 34  ALT 17  ALKPHOS 92  BILITOT 1.1  PROT 5.4*  ALBUMIN 2.6*   CBC:  Recent Labs Lab 06/18/14 1650 06/20/14 0542 06/21/14 0833 06/23/14 0450  WBC 10.7* 8.4 10.9* 13.5*  HGB 9.6* 8.8* 9.4* 9.5*  HCT 29.6* 27.7* 29.9* 30.0*  MCV 92.8 94.5 94.6 94.6  PLT 194 221 243 297   Cardiac Enzymes:  Recent Labs Lab 06/18/14 1708 06/18/14 2245 06/19/14 0432  TROPONINI 0.05* 0.04* 0.04*   CBG:  Recent Labs Lab 06/16/14 2225  GLUCAP 95    Recent Results (from the past 240 hour(s))  Culture, blood (routine x 2)     Status: None (Preliminary result)   Collection Time:  06/18/14  8:33 PM  Result Value Ref Range Status   Specimen Description BLOOD LEFT ARM  Final   Special Requests BOTTLES DRAWN AEROBIC AND ANAEROBIC 10CC  Final   Culture   Final           BLOOD CULTURE RECEIVED NO GROWTH TO DATE CULTURE WILL BE HELD FOR 5 DAYS BEFORE ISSUING A FINAL NEGATIVE REPORT Performed at [facility]    Report Status PENDING  Incomplete  Culture, blood (routine x 2)     Status: None (Preliminary result)   Collection Time: 06/18/14  8:38 PM  Result Value Ref Range Status   Specimen Description BLOOD LEFT ARM  Final   Special Requests BOTTLES DRAWN AEROBIC ONLY 5 CC  Final   Culture   Final           BLOOD CULTURE RECEIVED NO GROWTH TO DATE CULTURE WILL BE HELD FOR 5 DAYS BEFORE ISSUING A FINAL NEGATIVE REPORT Performed at [facility]    Report Status PENDING  Incomplete     Studies: US Renal  06/23/2014   CLINICAL DATA:  Renal failure. Recent transurethral resection of bladder tumor  EXAM: RENAL/URINARY TRACT ULTRASOUND COMPLETE  COMPARISON:  CT abdomen and pelvis June 11, 2014  FINDINGS: Right Kidney:  Length: 12.5 cm. Echogenicity within normal limits. There is renal cortical thinning. There is no pelvicaliectasis or perinephric fluid. There is no demonstrable mass. There is  no sonographically demonstrable calculus or ureterectasis.  Left Kidney:  Length: 10.7 cm. Echogenicity within normal limits. There is renal cortical thinning. There are several cysts in the mid to lower pole left kidney. The largest cyst measures 3.0 x 2.8 x 2.9 cm and is toward the lower pole. In the mid portion, there is a 2.8 x 2.8 x 2.4 cm cyst as well as a nearby 2.1 x 1.9 x 1.7 cm cyst. There is no pelvicaliectasis or perinephric fluid. No sonographically demonstrable calculus or ureterectasis.  Bladder:  There is a complex mass in the inferior aspect of the urinary bladder which contains moderate fluid consistent with recently biopsied mass. The wall of the urinary  bladder is not appreciably thickened. This mass currently measures 3.5 x 3.1 cm.  IMPRESSION: Both kidneys show renal cortical thinning consistent with medical renal disease. No obstructing foci in either kidney. There are 3 cysts in the left kidney peripherally. There is a complex lesion in the urinary bladder consistent with recently biopsied bladder tumor.   Electronically Signed   By: Lowella Grip M.D.   On: 06/23/2014 14:26    Scheduled Meds: . acetaminophen  1,000 mg Oral 3 times per day  . amiodarone  200 mg Oral Daily  . amoxicillin-clavulanate  1 tablet Oral TID  . budesonide (PULMICORT) nebulizer solution  0.25 mg Nebulization BID  . docusate sodium  100 mg Oral BID  . feeding supplement (ENSURE COMPLETE)  237 mL Oral BID BM  . levalbuterol  1.25 mg Nebulization Q6H WA  . polyethylene glycol  17 g Oral QPC supper  . rosuvastatin  20 mg Oral Daily  . senna  1 tablet Oral Daily  . tamsulosin  0.4 mg Oral QHS  . tiotropium  18 mcg Inhalation Daily   Continuous Infusions: . sodium chloride 75 mL/hr at 06/23/14 1028    Active Problems:   Cancer of bladder wall   Protein-calorie malnutrition, severe   Hypotension   PNA (pneumonia)    Time spent: 30 minutes.    Barton Dubois  [facility] Pager [phone]. If 7PM-7AM, please contact night-coverage at [email or web address], password [facility] 06/23/2014, 5:46 PM  LOS: 7 days

## 2014-06-23 NOTE — Progress Notes (Signed)
NUTRITION FOLLOW-UP  DOCUMENTATION CODES Per approved criteria  -Severe malnutrition in the context of chronic illness  Pt meets criteria for severe MALNUTRITION in the context of chronic illness as evidenced by 9% weight loss x 2 months and energy intake <75% for >/=1 month.  INTERVENTION: -Continue Ensure Complete po BID, each supplement provides 350 kcal and 13 grams of protein -Encouraged PO intake -RD to continue to monitor  NUTRITION DIAGNOSIS: Inadequate oral intake related to poor appetite as evidenced by poor PO intake x 2 months per pt and family; ongoing  Goal: Pt to meet >/= 90% of their estimated nutrition needs; not met  Monitor:  PO and supplemental intake, weight, labs, I/O's  ASSESSMENT: 81 year old male presents for management of bladder tumors. S/p 1/18 Transurethral resection of bladder tumor  1/18: Pt and family reports pt had poor appetite PTA which started around November 2015. Per weight history documentation, pt has lost 16 lb since November (9% weight loss x 2 months, significant for time frame).  During visit, pt had tried a couple of sips of soup and was drinking his Ensure supplement. Pt is eating slowly d/t surgery this AM. Per family, pt consumes Boost supplements at home, however pt is not drinking them daily. Encouraged pt to continue to consume Boost supplements daily after discharge to prevent further weight loss.  1/25: - Pt down for ultrasound during RD visit. Spoke with pt's daughter.  - She reported that pt is only eating small amounts. He had ~ 12 bites of lunch today. He also is drinking sips of Ensure. She says that his intake does appear to be improving. Family is assisting with pt's meals and is helping to encourage po.  - PO intake recorded as 10-25%.   Labs Reviewed.   Height: Ht Readings from Last 1 Encounters:  06/16/14 5' 10" (1.778 m)    Weight: Wt Readings from Last 1 Encounters:  06/23/14 184 lb 4.9 oz (83.6 kg)     BMI:  Body mass index is 26.44 kg/(m^2).  Estimated Nutritional Needs: Kcal: 2100-2300 Protein: 90-100g Fluid: 2.3L/day  Skin: intact  Diet Order: Diet regular  EDUCATION NEEDS: -No education needs identified at this time   Intake/Output Summary (Last 24 hours) at 06/23/14 1531 Last data filed at 06/23/14 1306  Gross per 24 hour  Intake    360 ml  Output    345 ml  Net     15 ml    Last BM: 1/24  Labs:   Recent Labs Lab 06/20/14 0542 06/21/14 0833 06/23/14 0450  NA 135 133* 137  K 4.4 4.9 4.5  CL 103 103 104  CO2 _0 BUN 36* 34* 47*  CREATININE 1.92* 1.92* 2.10*  CALCIUM 8.2* 8.3* 8.7  GLUCOSE 113* 93 107*    CBG (last 3)  No results for input(s): GLUCAP in the last 72 hours.  Scheduled Meds: . acetaminophen  1,000 mg Oral 3 times per day  . amiodarone  200 mg Oral Daily  . amoxicillin-clavulanate  1 tablet Oral TID  . budesonide (PULMICORT) nebulizer solution  0.25 mg Nebulization BID  . docusate sodium  100 mg Oral BID  . feeding supplement (ENSURE COMPLETE)  237 mL Oral BID BM  . levalbuterol  1.25 mg Nebulization Q6H WA  . polyethylene glycol  17 g Oral QPC supper  . rosuvastatin  20 mg Oral Daily  . senna  1 tablet Oral Daily  . tamsulosin  0.4 mg Oral  QHS  . tiotropium  18 mcg Inhalation Daily    Continuous Infusions: . sodium chloride 75 mL/hr at 06/23/14 1028    Past Medical History  Diagnosis Date  . Gout     "only once in my lifetime" (12/18/2012)  . Depression   . PVD (peripheral vascular disease)     s/p B CEA  . COPD (chronic obstructive pulmonary disease)      PFT 6.19.07: FEV1 465  ratio 60 with 25% response to B2.  > PFTs 8.29.07: FEV1 61%  ratio 46% no better after B2.  > add on advair 12.20.11-improved 1.31.12  . AAA (abdominal aortic aneurysm)   . Melanosis coli   . Internal hemorrhoids   . Carotid artery occlusion   . Aortic valve disorder   . Chronic diastolic heart failure 51/11/15  . CAD (coronary  artery disease), native coronary artery     CABG w LIMA to LAD, SVG to dx, OM, RCA 1989 Dr. Arlyce Dice for 3VD PTCA of OM, 1999 and 2000 Cath showed occlusion of left main and RCA with stenosis in OM Redo redo CABG w SVG to OM, SVG to RCA8/10/00 Dr. Cyndia Bent   . Hyperlipidemia   . BPH (benign prostatic hypertrophy)   . Hypertensive heart disease     Change toprol to bisoprolol 10 mg daily on trial basis  05/21/2012  - notified by [facility] non adherent 07/17/2012    . LBBB (left bundle branch block)   . Second degree heart block 12/18/2012    s/p MDT Adapta L pacemaker 12-20-2012 by Dr Caryl Comes  . Shingles years ago    mild  . PONV (postoperative nausea and vomiting) 1944    with ether  . Abrasion of left forearm dec 2015    small scraped area healing  . Chronic kidney disease stage III (GFR 30-59 ml/min)     saw dr Justin Mend oct 2015 and released by dr webb  . Congenital absence of kidney     "noted during AAA repair; never knew it before" (12/18/2012)  . Cancer     bladder  . Gout years ago  . Myocardial infarction 1979  . Cough     coughing up green sputum last 4 to 5 days    Past Surgical History  Procedure Laterality Date  . Cholecystectomy  1998  . Hemiarthroplasty shoulder fracture Right ~ 2008     x 2 - [facility]  . Carotid endarterectomy Bilateral 1990's  . Abdominal aortic aneurysm repair  1998  . Carotid-subclavian bypass graft Left 1990's  . Anal fissure repair      12/18/2012 "I don't remember this"  . Hemorroidectomy      12/18/2012 "I don't remember this"  . Carpal tunnel release Right 1980's    "Dr. Marrian Salvage" (12/18/2012)  . Cardiac catheterization      "2 or 3" (12/18/2012)  . Coronary angioplasty      "a few" (12/18/2012)  . Coronary angioplasty with stent placement      "I've got 1 or 2" (12/18/2012)  . Hip fracture surgery  1944    "fell out of a tree; had it operated on 3 times" (12/18/2012)  . Refractive surgery Bilateral 1990's?  . Pacemaker insertion  12-20-2012    dual  chamber Medtronic Adapta L pacemaker implanted by Dr Caryl Comes  . Cardioversion N/A 12/30/2013    Procedure: CARDIOVERSION;  Surgeon: Jacolyn Reedy, MD;  Location: [facility];  Service: Cardiovascular;  Laterality: N/A;  . Permanent pacemaker insertion  N/A 12/20/2012    Procedure: PERMANENT PACEMAKER INSERTION;  Surgeon: Deboraha Sprang, MD;  Location: [facility] CATH LAB;  Service: Cardiovascular;  Laterality: N/A;  . Coronary artery bypass graft  1989, 2000     "CABG X ?3; CABG X 5 w//Bartle" (12/18/2012)  . Transurethral resection of bladder tumor N/A 06/16/2014    Procedure: TRANSURETHRAL RESECTION OF BLADDER TUMOR (TURBT) ;  Surgeon: Jorja Loa, MD;  Location: [facility];  Service: Urology;  Laterality: N/A;    Laurette Schimke MS, RD, LDN

## 2014-06-23 NOTE — Progress Notes (Signed)
Physical Therapy Treatment Patient Details Name: Edward [name] MRN: [id number] DOB: [birth year] Today's Date: 06/23/2014    History of Present Illness 81 yo male s/p TURBT 06/16/14. Hx of COPD, CAD, pacemaker, A fib, AAA, HTN, PVD, BPH    PT Comments    Pt appears confused during session. Some difficulty following commands/processing. Wheezing noted with minimal activity. O2 sats dropped to 85% on 2L O2 during ambulation. Son present during session and assisted as well. Pt continues to require Min assist for safe mobility. Will likely need 24 hour supervision at discharge.   Follow Up Recommendations  Home health PT;Supervision/Assistance - 24 hour (may need to consider SNF if 24 hour is not available)     Equipment Recommendations  Rolling walker with 5" wheels    Recommendations for Other Services OT consult     Precautions / Restrictions Precautions Precautions: Fall Restrictions Weight Bearing Restrictions: No    Mobility  Bed Mobility Overal bed mobility: Needs Assistance Bed Mobility: Supine to Sit     Supine to sit: Min assist;HOB elevated     General bed mobility comments: Increased time. Assist for trunk.   Transfers Overall transfer level: Needs assistance Equipment used: Rolling walker (2 wheeled) Transfers: Sit to/from Stand           General transfer comment: Assist to rise, stabilize, control descent. Multimodal cues needed for safety, hand placement  Ambulation/Gait Ambulation/Gait assistance: Min assist Ambulation Distance (Feet): 150 Feet Assistive device: Rolling walker (2 wheeled) Gait Pattern/deviations: Step-through pattern;Decreased stride length;Trunk flexed     General Gait Details: Assist to stabilize during ambulation. Dyspnea 2/4. O2 sats 85% on RA. Fatigues fairly easily. Brief standing rest breaks needed. VCs for deep/pursed lip breathing.    Stairs            Wheelchair Mobility    Modified Rankin (Stroke Patients  Only)       Balance                                    Cognition Arousal/Alertness: Awake/alert Behavior During Therapy: WFL for tasks assessed/performed Overall Cognitive Status: Impaired/Different from baseline Area of Impairment: Following commands;Problem solving       Following Commands: Follows one step commands inconsistently     Problem Solving: Requires tactile cues;Requires verbal cues      Exercises      General Comments        Pertinent Vitals/Pain Pain Assessment: No/denies pain    Home Living                      Prior Function            PT Goals (current goals can now be found in the care plan section) Progress towards PT goals: Progressing toward goals    Frequency  Min 3X/week    PT Plan Current plan remains appropriate    Co-evaluation             End of Session Equipment Utilized During Treatment: Gait belt;Oxygen Activity Tolerance: Patient limited by fatigue Patient left: in chair;with call bell/phone within reach;with family/visitor present (instructed son to make nursing aware if he leaves or if pt wants to get back into bed)     Time: 6269-4854 PT Time Calculation (min) (ACUTE ONLY): 26 min  Charges:  $Gait Training: 23-37 mins  G Codes:      Weston Anna, MPT Pager: [phone]

## 2014-06-24 ENCOUNTER — Inpatient Hospital Stay (HOSPITAL_COMMUNITY): Payer: Medicare Other

## 2014-06-24 ENCOUNTER — Encounter (HOSPITAL_COMMUNITY): Payer: Self-pay | Admitting: Cardiology

## 2014-06-24 LAB — CBC
HCT: 31 % — ABNORMAL LOW (ref 39.0–52.0)
Hemoglobin: 9.8 g/dL — ABNORMAL LOW (ref 13.0–17.0)
MCH: 29.8 pg (ref 26.0–34.0)
MCHC: 31.6 g/dL (ref 30.0–36.0)
MCV: 94.2 fL (ref 78.0–100.0)
PLATELETS: 317 10*3/uL (ref 150–400)
RBC: 3.29 MIL/uL — ABNORMAL LOW (ref 4.22–5.81)
RDW: 14.2 % (ref 11.5–15.5)
WBC: 12.1 10*3/uL — AB (ref 4.0–10.5)

## 2014-06-24 LAB — BLOOD GAS, ARTERIAL
Acid-base deficit: 1.6 mmol/L (ref 0.0–2.0)
BICARBONATE: 22.3 meq/L (ref 20.0–24.0)
O2 CONTENT: 2 L/min
O2 SAT: 94.6 %
Patient temperature: 98.6
TCO2: 20.7 mmol/L (ref 0–100)
pCO2 arterial: 36.5 mmHg (ref 35.0–45.0)
pH, Arterial: 7.403 (ref 7.350–7.450)
pO2, Arterial: 69.8 mmHg — ABNORMAL LOW (ref 80.0–100.0)

## 2014-06-24 LAB — PROTIME-INR
INR: 2.12 — AB (ref 0.00–1.49)
Prothrombin Time: 23.9 seconds — ABNORMAL HIGH (ref 11.6–15.2)

## 2014-06-24 LAB — URINE CULTURE: Colony Count: 65000

## 2014-06-24 LAB — TROPONIN I
TROPONIN I: 0.03 ng/mL (ref <0.031)
Troponin I: 0.04 ng/mL — ABNORMAL HIGH (ref <0.031)

## 2014-06-24 LAB — BASIC METABOLIC PANEL
Anion gap: 8 (ref 5–15)
BUN: 39 mg/dL — AB (ref 6–23)
CALCIUM: 8.4 mg/dL (ref 8.4–10.5)
CO2: 24 mmol/L (ref 19–32)
CREATININE: 1.79 mg/dL — AB (ref 0.50–1.35)
Chloride: 103 mmol/L (ref 96–112)
GFR, EST AFRICAN AMERICAN: 39 mL/min — AB (ref >90)
GFR, EST NON AFRICAN AMERICAN: 34 mL/min — AB (ref >90)
GLUCOSE: 96 mg/dL (ref 70–99)
Potassium: 4.4 mmol/L (ref 3.5–5.1)
Sodium: 135 mmol/L (ref 135–145)

## 2014-06-24 LAB — BRAIN NATRIURETIC PEPTIDE: B Natriuretic Peptide: 1232.5 pg/mL — ABNORMAL HIGH (ref 0.0–100.0)

## 2014-06-24 MED ORDER — TECHNETIUM TC 99M DIETHYLENETRIAME-PENTAACETIC ACID: 36.0000 | Freq: Once | INTRAVENOUS | Status: AC | PRN

## 2014-06-24 MED ORDER — FUROSEMIDE 10 MG/ML IJ SOLN
40.0000 mg | Freq: Once | INTRAMUSCULAR | Status: AC
  Administered 2014-06-24: 40 mg via INTRAVENOUS
  Filled 2014-06-24: qty 4

## 2014-06-24 MED ORDER — FUROSEMIDE 10 MG/ML IJ SOLN
40.0000 mg | Freq: Two times a day (BID) | INTRAMUSCULAR | Status: DC
  Administered 2014-06-25 – 2014-06-26 (×3): 40 mg via INTRAVENOUS
  Filled 2014-06-24 (×3): qty 4

## 2014-06-24 MED ORDER — TECHNETIUM TO 99M ALBUMIN AGGREGATED
5.0000 | Freq: Once | INTRAVENOUS | Status: AC | PRN
  Administered 2014-06-24: 5 via INTRAVENOUS

## 2014-06-24 MED ORDER — BOOST PLUS PO LIQD
237.0000 mL | Freq: Two times a day (BID) | ORAL | Status: DC
  Administered 2014-06-24 – 2014-06-28 (×8): 237 mL via ORAL
  Filled 2014-06-24 (×9): qty 237

## 2014-06-24 NOTE — Progress Notes (Signed)
[facility] PROGRESS NOTE  Edward [name] HYW:[id number] DOB: Sep 26, 1929 DOA: 06/16/2014 PCP: Gwendolyn Grant, MD  Assessment/Plan: 1-Acute hypoxic Respiratory Failure in the setting of HCAP  -Will continue Augmentin (day 2/5) -start flutter valve along with ICS -continue Mucinex, nebulizer treatments. -patient with worsening dyspnea, also report chest pain.  -chest x ray showed bilateral pulmonary edema.  -Will cycle cardiac enzymes, IV lasix 40 mg one time dose, check ABG. Cardiology consulted, Dr Wynonia Lawman.  -V-Q scan rule out PE. Discussed with Dr Diona Fanti if need we can start heparin Gtt.   2-Acute Chronic combined systolic/diastolic HF; -no ACE/ARB due to renal failure with recent AKI -follow daily weight -patient with increase dyspnea and chest pain.  -IV lasix. Cardiology consulted.   3-Acute Renal Failure;  -Baseline creatinine around 1.6-1.9 at baseline -Cr is 1.7 today -Avoid hypotension, continue holding nephrotoxic agents.  -NSL fluid due to concern for pulmonary edema.  -renal US has been ordered by urology to r/o obstruction -will also check UA/urine cx  4-Atrial fibrillation status post Ablation, status post pacemaker patient to continue with amiodarone  hold anticoagulation until hematuria resolves completely (urology to help deciding when is safe to anticoagulate again)   5-ileus:improving clinically. Has had another small BM this morning. Will continue diet as tolerated  6-Hyponatremia: improved/stable; will monitor. Hold IV fluids due to concern for pulmonary edema.   7-Hematuria, TURP; post surgery and in setting of anticoagulation. Repeat hgb has remained stable and there is no need for transfusion currently. Will monitor.   8-mild elevated troponin. In setting renal failure. ekg with ventricular paced rhythm.   9-delirium: multifactorial: hospital acquired, PNA, use of narcotics and inability to sleep/rest with ongoing bladder spasm and  pain. -will continue minimizing narcotics; start schedule tylenol and will follow urology rec's for bladder spasms and retention -mentation is better and overall condition improving. Patient AAOX2   10-left upper extremity:  -neg LUE duplex for DVT and/or SVT -continue arm elevation -appears to be secondary to 3rd shift spacing  Code Status: full code.  Family Communication: care discussed with son, who was at bedside.  Disposition Plan: remain inpatient.    Procedures - Left ventricle: The cavity size was normal. There was moderate concentric hypertrophy. Systolic function was mildly to moderately reduced. The estimated ejection fraction was in the range of 40% to 45%. Moderate hypokinesis of the apical myocardium. Mild hypokinesis of the inferior myocardium. Features are consistent with a pseudonormal left ventricular filling pattern, with concomitant abnormal relaxation and increased filling pressure (grade 2 diastolic dysfunction). - Aortic valve: Valve mobility was mildly restricted. There was mild to moderate stenosis. There was mild regurgitation. Valve area (VTI): 1.23 cm^2. Valve area (Vmax): 0.93 cm^2. Valve area (Vmean): 0.9 cm^2. - Mitral valve: Mildly to moderately calcified annulus. There was moderate regurgitation. - Left atrium: The atrium was mildly to moderately dilated. - Right atrium: The atrium was mildly dilated. - Pulmonary arteries: Systolic pressure was moderately increased. PA peak pressure: 55 mm Hg (S).  Antibiotics:  Vancomycin 1-20>>1/24  Zosyn 1-20>>1/24  Augmentin 1/24>>1/29 (plan is to complete 5 more days of tx)  HPI/Subjective: Patient complaining of worsening SOB today, worse on exertion.  He had some chest pain early today, when he went to radiology department.   Objective: Filed Vitals:   06/24/14 0549  BP: 132/53  Pulse: 71  Temp: 98 F (36.7 C)  Resp: 18    Intake/Output Summary (Last 24 hours) at  06/24/14 1252 Last data filed at 06/24/14  4008  Gross per 24 hour  Intake   1060 ml  Output    525 ml  Net    535 ml   Filed Weights   06/22/14 0517 06/23/14 0537 06/24/14 0549  Weight: 81.693 kg (180 lb 1.6 oz) 83.6 kg (184 lb 4.9 oz) 81.92 kg (180 lb 9.6 oz)    Exam:   General:  Alert, oriented X2; mild dyspnea.   Generalized weakness. Intermittently emptying bladder and with improvement in bladder spasm and pain.   Cardiovascular: S 1, S 2 RRR  Respiratory:decrease breath sounds bilateral crackles.   Abdomen: bs present, less distension. No rigidity  Musculoskeletal: plus 2 LE edema   Data Reviewed: Basic Metabolic Panel:  Recent Labs Lab 06/19/14 0820 06/20/14 0542 06/21/14 0833 06/23/14 0450 06/24/14 0805  NA 129* 135 133* 137 135  K 4.5 4.4 4.9 4.5 4.4  CL 99 103 103 104 103  CO2 23 24 23 23 24   GLUCOSE 107* 113* 93 107* 96  BUN 40* 36* 34* 47* 39*  CREATININE 2.12* 1.92* 1.92* 2.10* 1.79*  CALCIUM 7.9* 8.2* 8.3* 8.7 8.4   Liver Function Tests:  Recent Labs Lab 06/18/14 1708  AST 34  ALT 17  ALKPHOS 92  BILITOT 1.1  PROT 5.4*  ALBUMIN 2.6*   CBC:  Recent Labs Lab 06/18/14 1650 06/20/14 0542 06/21/14 0833 06/23/14 0450  WBC 10.7* 8.4 10.9* 13.5*  HGB 9.6* 8.8* 9.4* 9.5*  HCT 29.6* 27.7* 29.9* 30.0*  MCV 92.8 94.5 94.6 94.6  PLT 194 221 243 297   Cardiac Enzymes:  Recent Labs Lab 06/18/14 1708 06/18/14 2245 06/19/14 0432  TROPONINI 0.05* 0.04* 0.04*   CBG: No results for input(s): GLUCAP in the last 168 hours.  Recent Results (from the past 240 hour(s))  Culture, blood (routine x 2)     Status: None (Preliminary result)   Collection Time: 06/18/14  8:33 PM  Result Value Ref Range Status   Specimen Description BLOOD LEFT ARM  Final   Special Requests BOTTLES DRAWN AEROBIC AND ANAEROBIC 10CC  Final   Culture   Final           BLOOD CULTURE RECEIVED NO GROWTH TO DATE CULTURE WILL BE HELD FOR 5 DAYS BEFORE ISSUING A FINAL  NEGATIVE REPORT Performed at [facility]    Report Status PENDING  Incomplete  Culture, blood (routine x 2)     Status: None (Preliminary result)   Collection Time: 06/18/14  8:38 PM  Result Value Ref Range Status   Specimen Description BLOOD LEFT ARM  Final   Special Requests BOTTLES DRAWN AEROBIC ONLY 5 CC  Final   Culture   Final           BLOOD CULTURE RECEIVED NO GROWTH TO DATE CULTURE WILL BE HELD FOR 5 DAYS BEFORE ISSUING A FINAL NEGATIVE REPORT Performed at [facility]    Report Status PENDING  Incomplete  Culture, Urine     Status: None   Collection Time: 06/23/14 10:31 AM  Result Value Ref Range Status   Specimen Description URINE, CLEAN CATCH  Final   Special Requests augmentin Immunocompromised  Final   Colony Count   Final    65,000 COLONIES/ML Performed at [facility]    Culture YEAST Performed at [facility]   Final   Report Status 06/24/2014 FINAL  Final     Studies: US Renal  06/23/2014   CLINICAL DATA:  Renal failure. Recent transurethral resection of  bladder tumor  EXAM: RENAL/URINARY TRACT ULTRASOUND COMPLETE  COMPARISON:  CT abdomen and pelvis June 11, 2014  FINDINGS: Right Kidney:  Length: 12.5 cm. Echogenicity within normal limits. There is renal cortical thinning. There is no pelvicaliectasis or perinephric fluid. There is no demonstrable mass. There is no sonographically demonstrable calculus or ureterectasis.  Left Kidney:  Length: 10.7 cm. Echogenicity within normal limits. There is renal cortical thinning. There are several cysts in the mid to lower pole left kidney. The largest cyst measures 3.0 x 2.8 x 2.9 cm and is toward the lower pole. In the mid portion, there is a 2.8 x 2.8 x 2.4 cm cyst as well as a nearby 2.1 x 1.9 x 1.7 cm cyst. There is no pelvicaliectasis or perinephric fluid. No sonographically demonstrable calculus or ureterectasis.  Bladder:  There is a complex mass in the inferior aspect of the urinary  bladder which contains moderate fluid consistent with recently biopsied mass. The wall of the urinary bladder is not appreciably thickened. This mass currently measures 3.5 x 3.1 cm.  IMPRESSION: Both kidneys show renal cortical thinning consistent with medical renal disease. No obstructing foci in either kidney. There are 3 cysts in the left kidney peripherally. There is a complex lesion in the urinary bladder consistent with recently biopsied bladder tumor.   Electronically Signed   By: Lowella Grip M.D.   On: 06/23/2014 14:26    Scheduled Meds: . acetaminophen  1,000 mg Oral 3 times per day  . amiodarone  200 mg Oral Daily  . amoxicillin-clavulanate  1 tablet Oral TID  . budesonide (PULMICORT) nebulizer solution  0.25 mg Nebulization BID  . docusate sodium  100 mg Oral BID  . lactose free nutrition  237 mL Oral BID BM  . levalbuterol  1.25 mg Nebulization Q6H WA  . polyethylene glycol  17 g Oral QPC supper  . rosuvastatin  20 mg Oral Daily  . senna  1 tablet Oral Daily  . tamsulosin  0.4 mg Oral QHS  . tiotropium  18 mcg Inhalation Daily   Continuous Infusions:    Active Problems:   Cancer of bladder wall   Protein-calorie malnutrition, severe   Hypotension   PNA (pneumonia)    Time spent: 35 minutes.    Niel Hummer A  [facility] Pager [phone]. If 7PM-7AM, please contact night-coverage at [email or web address], password [facility] 06/24/2014, 12:52 PM  LOS: 8 days

## 2014-06-24 NOTE — Progress Notes (Signed)
8 Days Post-Op Subjective: Patient reports that he is feeling better. He slept better last night, and is much more alert/awake today. He had a couple of loose stools yesterday after starting Augmentin recently.  Objective: Vital signs in last 24 hours: Temp:  [97.8 F (36.6 C)-98.4 F (36.9 C)] 98 F (36.7 C) (01/26 0549) Pulse Rate:  [70-72] 71 (01/26 0549) Resp:  [18] 18 (01/26 0549) BP: (99-138)/(53-80) 132/53 mmHg (01/26 0549) SpO2:  [95 %-100 %] 96 % (01/26 0549) Weight:  [81.92 kg (180 lb 9.6 oz)] 81.92 kg (180 lb 9.6 oz) (01/26 0549)  Intake/Output from previous day: 01/25 0701 - 01/26 0700 In: 1097.5 [P.O.:120; I.V.:977.5] Out: 625 [Urine:625] Intake/Output this shift:    Physical Exam:  Constitutional: Vital signs reviewed. WD WN in NAD   Eyes: PERRL, No scleral icterus.   Pulmonary/Chest: Normal effort Abdominal: Soft. Non-tender, non-distended, no rebound, or guarding present.    Urine is pinkish red without clots.   Lab Results:  Recent Labs  06/21/14 0833 06/23/14 0450  HGB 9.4* 9.5*  HCT 29.9* 30.0*   BMET  Recent Labs  06/21/14 0833 06/23/14 0450  NA 133* 137  K 4.9 4.5  CL 103 104  CO2 23 23  GLUCOSE 93 107*  BUN 34* 47*  CREATININE 1.92* 2.10*  CALCIUM 8.3* 8.7   No results for input(s): LABPT, INR in the last 72 hours. No results for input(s): LABURIN in the last 72 hours. Results for orders placed or performed during the hospital encounter of 06/16/14  Culture, blood (routine x 2)     Status: None (Preliminary result)   Collection Time: 06/18/14  8:33 PM  Result Value Ref Range Status   Specimen Description BLOOD LEFT ARM  Final   Special Requests BOTTLES DRAWN AEROBIC AND ANAEROBIC 10CC  Final   Culture   Final           BLOOD CULTURE RECEIVED NO GROWTH TO DATE CULTURE WILL BE HELD FOR 5 DAYS BEFORE ISSUING A FINAL NEGATIVE REPORT Performed at [facility]    Report Status PENDING  Incomplete  Culture, blood (routine x  2)     Status: None (Preliminary result)   Collection Time: 06/18/14  8:38 PM  Result Value Ref Range Status   Specimen Description BLOOD LEFT ARM  Final   Special Requests BOTTLES DRAWN AEROBIC ONLY 5 CC  Final   Culture   Final           BLOOD CULTURE RECEIVED NO GROWTH TO DATE CULTURE WILL BE HELD FOR 5 DAYS BEFORE ISSUING A FINAL NEGATIVE REPORT Performed at [facility]    Report Status PENDING  Incomplete    Studies/Results: US Renal  06/23/2014   CLINICAL DATA:  Renal failure. Recent transurethral resection of bladder tumor  EXAM: RENAL/URINARY TRACT ULTRASOUND COMPLETE  COMPARISON:  CT abdomen and pelvis June 11, 2014  FINDINGS: Right Kidney:  Length: 12.5 cm. Echogenicity within normal limits. There is renal cortical thinning. There is no pelvicaliectasis or perinephric fluid. There is no demonstrable mass. There is no sonographically demonstrable calculus or ureterectasis.  Left Kidney:  Length: 10.7 cm. Echogenicity within normal limits. There is renal cortical thinning. There are several cysts in the mid to lower pole left kidney. The largest cyst measures 3.0 x 2.8 x 2.9 cm and is toward the lower pole. In the mid portion, there is a 2.8 x 2.8 x 2.4 cm cyst as well as a nearby 2.1 x 1.9  x 1.7 cm cyst. There is no pelvicaliectasis or perinephric fluid. No sonographically demonstrable calculus or ureterectasis.  Bladder:  There is a complex mass in the inferior aspect of the urinary bladder which contains moderate fluid consistent with recently biopsied mass. The wall of the urinary bladder is not appreciably thickened. This mass currently measures 3.5 x 3.1 cm.  IMPRESSION: Both kidneys show renal cortical thinning consistent with medical renal disease. No obstructing foci in either kidney. There are 3 cysts in the left kidney peripherally. There is a complex lesion in the urinary bladder consistent with recently biopsied bladder tumor.   Electronically Signed   By: Lowella Grip M.D.   On: 06/23/2014 14:26   There is no bladder mass according to my cystoscopy last week. This "bladder mass" above is a relatively small clot Assessment/Plan:   Postoperative day #8 TURBT. He's had a difficult time with pneumonia, delirium, both of these seem to be resolving. He is voiding adequately currently although urine is still a bit dark from his clot within the bladder.    From a urologic standpoint, I think he is approaching discharge. Still, he needs some medical tuning up. I would prefer him to stay off his Coumadin for another 5 days or so.    I have ordered a basic metabolic panel today.    Follow-up has been arranged already. I will leave it up to medicine to decide when to discharge    I spoke with the patient and his son today.   LOS: 8 days   Franchot Gallo M 06/24/2014, 7:29 AM

## 2014-06-24 NOTE — Progress Notes (Signed)
Cont with plan of care. No changes in initial am assessment at this time

## 2014-06-24 NOTE — Consult Note (Addendum)
Cardiology Consult Note  Admit date: 06/16/2014 Name: Edward [name] 81 y.o.  male DOB:  09/25/1929 MRN:  [id number]  Today's date:  06/24/2014  Referring Physician:    [facility]  Primary Physician:    Dr. Basilio Cairo  Reason for Consultation:    Shortness of breath postoperatively  IMPRESSIONS: 1.  Acute on chronic combined systolic and diastolic heart failure. 2.  Coronary artery disease with previous bypass grafting and previous infarction. 3.  Chronic kidney disease stage 3-4. 4.  Severe peripheral vascular disease. 5.  History of atrial fibrillation on amiodarone. 6.  Functioning pacemaker. 7.  Recent pneumonia. 8.  Recent surgery for bladder cancer with residual tumor  RECOMMENDATION: 1.  Suspect that shortness of breath is due to congestive heart failure due to volume overload.  His weight is up 11 pounds according to the records. 2.  Intravenous diuresis with attention to kidney function that may worsen with diuresis. 3.  Interrogate pacemaker to be sure he is maintaining sinus rhythm and has not gone into atrial fibrillation.  Will follow with you  HISTORY: This 81 year old male is well-known to me and has a long-standing history of ischemic heart disease and coronary artery disease.  He was admitted for resection of bladder cancer, and postoperatively has had difficulty with respiratory distress.  He was initially treated for pneumonia but developed worsening dyspnea this morning.  He has received some intravenous fluids and his weight is up about 11 pounds since he has been admitted.  He also has known COPD in the past.  He was noted to be in pulmonary edema this morning.  He was given an dose of Lasix and I was asked to see him.  He is currently feeling better and is breathing better.  He has not had any ischemic chest pain.  He has had some mild pleuritic type chest pain as well as PND.  He is weak and finding it difficult to walk.  He has been back on his warfarin.   He had a negative ventilation perfusion scan and  also negative upper extremity venous Doppler for DVT.  Past Medical History  Diagnosis Date  . Gout     "only once in my lifetime" (12/18/2012)  . Depression   . PVD (peripheral vascular disease)     s/p B CEA  . COPD (chronic obstructive pulmonary disease)      PFT 6.19.07: FEV1 465  ratio 60 with 25% response to B2.  > PFTs 8.29.07: FEV1 61%  ratio 46% no better after B2.  > add on advair 12.20.11-improved 1.31.12  . AAA (abdominal aortic aneurysm)   . Melanosis coli   . Internal hemorrhoids   . Carotid artery occlusion   . Aortic valve disorder   . Chronic diastolic heart failure 53/10/6438  . CAD (coronary artery disease), native coronary artery     CABG w LIMA to LAD, SVG to dx, OM, RCA 1989 Dr. Arlyce Dice for 3VD PTCA of OM, 1999 and 2000 Cath showed occlusion of left main and RCA with stenosis in OM Redo redo CABG w SVG to OM, SVG to RCA8/10/00 Dr. Cyndia Bent   . Hyperlipidemia   . BPH (benign prostatic hypertrophy)   . Hypertensive heart disease     Change toprol to bisoprolol 10 mg daily on trial basis  05/21/2012  - notified by [facility] non adherent 07/17/2012    . LBBB (left bundle branch block)   . Second degree heart block 12/18/2012    s/p MDT  Adapta L pacemaker 12-20-2012 by Dr Caryl Comes  . Shingles years ago    mild  . Abrasion of left forearm dec 2015    small scraped area healing  . Chronic kidney disease stage III (GFR 30-59 ml/min)     saw dr Justin Mend oct 2015 and released by dr webb  . Congenital absence of kidney     "noted during AAA repair; never knew it before" (12/18/2012)  . Cancer     bladder  . Myocardial infarction 1979      Past Surgical History  Procedure Laterality Date  . Cholecystectomy  1998  . Hemiarthroplasty shoulder fracture Right ~ 2008     x 2 - [place]  . Carotid endarterectomy Bilateral 1990's  . Abdominal aortic aneurysm repair  1998  . Carotid-subclavian bypass graft Left 1990's  . Anal fissure repair       12/18/2012 "I don't remember this"  . Hemorroidectomy      12/18/2012 "I don't remember this"  . Carpal tunnel release Right 1980's    "Dr. Marrian Salvage" (12/18/2012)  . Cardiac catheterization      "2 or 3" (12/18/2012)  . Coronary angioplasty with stent placement      "I've got 1 or 2" (12/18/2012)  . Hip fracture surgery  1944    "fell out of a tree; had it operated on 3 times" (12/18/2012)  . Refractive surgery Bilateral 1990's?  . Pacemaker insertion  12-20-2012    dual chamber Medtronic Adapta L pacemaker implanted by Dr Caryl Comes  . Cardioversion N/A 12/30/2013    Procedure: CARDIOVERSION;  Surgeon: Jacolyn Reedy, MD;  Location: [place];  Service: Cardiovascular;  Laterality: N/A;  . Permanent pacemaker insertion N/A 12/20/2012    Procedure: PERMANENT PACEMAKER INSERTION;  Surgeon: Deboraha Sprang, MD;  Location: [facility] CATH LAB;  Service: Cardiovascular;  Laterality: N/A;  . Coronary artery bypass graft  1989, 2000     "CABG X ?3; CABG X 5 w//Bartle" (12/18/2012)  . Transurethral resection of bladder tumor N/A 06/16/2014    Procedure: TRANSURETHRAL RESECTION OF BLADDER TUMOR (TURBT) ;  Surgeon: Jorja Loa, MD;  Location: [facility];  Service: Urology;  Laterality: N/A;     Allergies:  is allergic to ace inhibitors; clarithromycin; codeine; and oxycodone-acetaminophen.   Medications: Prior to Admission medications   Medication Sig Start Date End Date Taking? Authorizing Provider  amiodarone (PACERONE) 200 MG tablet Take 200 mg by mouth daily.   Yes Historical Provider, MD  colchicine 0.6 MG tablet Take 0.6 mg by mouth daily as needed (gout flare ups).   Yes Historical Provider, MD  furosemide (LASIX) 20 MG tablet Take 20 mg by mouth daily as needed for fluid or edema (leg swelling).   Yes Historical Provider, MD  guaiFENesin (MUCINEX) 600 MG 12 hr tablet Take by mouth as needed.   Yes Historical Provider, MD  mometasone-formoterol (DULERA) 200-5 MCG/ACT AERO Inhale 2 puffs  into the lungs every 12 (twelve) hours. 03/12/14  Yes Tanda Rockers, MD  nitroGLYCERIN (NITROSTAT) 0.4 MG SL tablet Place 0.4 mg under the tongue every 5 (five) minutes as needed for chest pain.   Yes Historical Provider, MD  oxybutynin (DITROPAN) 5 MG tablet Take 5 mg by mouth every 8 (eight) hours as needed for bladder spasms (bladder spasms).    Yes Historical Provider, MD  Polyethylene Glycol 3350 (MIRALAX PO) Take 1 scoop by mouth daily after supper.   Yes Historical Provider, MD  rosuvastatin (CRESTOR) 20 MG  tablet Take 20 mg by mouth daily.     Yes Historical Provider, MD  tamsulosin (FLOMAX) 0.4 MG CAPS capsule Take 1 capsule (0.4 mg total) by mouth at bedtime. 01/24/14  Yes Rowe Clack, MD  temazepam (RESTORIL) 15 MG capsule Take 1 capsule (15 mg total) by mouth at bedtime. 05/01/14  Yes Rowe Clack, MD  tiotropium (SPIRIVA) 18 MCG inhalation capsule Place 1 capsule (18 mcg total) into inhaler and inhale daily. 03/12/14 03/12/15 Yes Tanda Rockers, MD  warfarin (COUMADIN) 1 MG tablet Take 1 mg by mouth daily.   Yes Historical Provider, MD  acetaminophen (TYLENOL) 325 MG tablet Take 2 tablets (650 mg total) by mouth every 6 (six) hours as needed for fever. 06/17/14   Ardis Hughs, MD  cephALEXin (KEFLEX) 250 MG capsule Take 1 capsule (250 mg total) by mouth 2 (two) times daily. 06/16/14   Jorja Loa, MD  HYDROcodone-acetaminophen (NORCO) 5-325 MG per tablet Take 1-2 tablets by mouth every 6 (six) hours as needed for moderate pain. 05/01/14   Rowe Clack, MD    Family History: Family Status  Relation Status Death Age  . Father Deceased     died of leukemia  . Mother Deceased     died of CAD  . Brother Deceased   . Brother Alive   . Sister Deceased     Died of trauma  . Sister Deceased   . Sister Deceased     Social History:   reports that he quit smoking about 18 years ago. His smoking use included Cigarettes. He has a 75 pack-year smoking history.  He has never used smokeless tobacco. He reports that he drinks about 1.8 oz of alcohol per week. He reports that he does not use illicit drugs.   History   Social History Narrative   Lives alone. Widower.    Review of Systems: He has been confused intermittently since he has been in the hospital.  He complains of some feelings of urgency.  He does have some edema and has a long-standing history of smoking.  Other than as noted above the remainder of the review of systems is unremarkable.  Physical Exam: BP 130/62 mmHg  Pulse 70  Temp(Src) 98.2 F (36.8 C) (Oral)  Resp 18  Ht 5\' 10"  (1.778 m)  Wt 81.92 kg (180 lb 9.6 oz)  BMI 25.91 kg/m2  SpO2 94%  General appearance: Pleasant male, conversant currently in no acute distress Head: Normocephalic, without obvious abnormality, atraumatic Neck: no adenopathy, no carotid bruit, supple, symmetrical, trachea midline and JVD.  Somewhat increased, bilateral carotid endarterectomy scars Lungs: Reduced breath sounds at the bases, rales heard at the left base Heart: Regular rate and rhythm, normal S1, S2, no S3, 2/6 systolic murmur Abdomen: soft, non-tender; bowel sounds normal; no masses,  no organomegaly Rectal: deferred Extremities: Prior saphenous harvesting scars noted, 2+ edema noted in lower extremities Pulses: Pedal pulses 1+ Neurologic: Grossly normal  Labs: CBC  Recent Labs  06/24/14 1430  WBC 12.1*  RBC 3.29*  HGB 9.8*  HCT 31.0*  PLT 317  MCV 94.2  MCH 29.8  MCHC 31.6  RDW 14.2   CMP   Recent Labs  06/24/14 0805  NA 135  K 4.4  CL 103  CO2 24  GLUCOSE 96  BUN 39*  CREATININE 1.79*  CALCIUM 8.4  GFRNONAA 34*  GFRAA 39*   Cardiac Panel (last 3 results)  Recent Labs  06/24/14 1445  TROPONINI  0.03     Radiology: Bilateral pleural effusions, possible pulmonary edema, upper lobe density consistent with pulmonary edema or pneumonia  EKG: Paced rhythm, difficult to tell if sinus or  not  Signed:  W. Doristine Church MD [facility]   Cardiology Consultant  06/24/2014, 4:54 PM

## 2014-06-24 NOTE — Evaluation (Signed)
Occupational Therapy Evaluation Patient Details Name: SABASTIEN TYLER MRN: [id number] DOB: [birth year] Today's Date: 06/24/2014    History of Present Illness 81 yo male s/p TURBT 06/16/14. Hx of COPD, CAD, pacemaker, A fib, AAA, HTN, PVD, BPH   Clinical Impression   Pt presents in deconditioned state and should benefit from acute OT to assist in maximizing independence w/ ADL and functional mobility/transfers as pt was I prior to this admission. Will follow acutely to address problems as listed below.    Follow Up Recommendations  SNF;Supervision/Assistance - 24 hour    Equipment Recommendations  Other (comment) (Defer to next venue)    Recommendations for Other Services       Precautions / Restrictions Precautions Precautions: Fall Restrictions Weight Bearing Restrictions: No      Mobility Bed Mobility Overal bed mobility: Needs Assistance Bed Mobility: Supine to Sit;Sit to Supine     Supine to sit: Min assist;HOB elevated Sit to supine: Min assist;HOB elevated   General bed mobility comments: Increased time. Assist for trunk in sitting and assist with LE in getting back to bed   Transfers Overall transfer level: Needs assistance Equipment used: 1 person hand held assist Transfers: Sit to/from [facility] Sit to Stand: Min assist Stand pivot transfers: Min assist       General transfer comment: Assist to rise, stabilize, control descent. Multimodal cues needed for safety, hand placement    Balance Overall balance assessment: Needs assistance Sitting-balance support: Bilateral upper extremity supported;Feet supported Sitting balance-Leahy Scale: Fair     Standing balance support: Single extremity supported Standing balance-Leahy Scale: Poor                              ADL Overall ADL's : Needs assistance/impaired     Grooming: Wash/dry hands;Wash/dry face;Set up;Bed level   Upper Body Bathing: Set up;Bed level   Lower Body  Bathing: Moderate assistance;Sit to/from stand   Upper Body Dressing : Set up;Bed level   Lower Body Dressing: Moderate assistance;Sit to/from stand Lower Body Dressing Details (indicate cue type and reason): Pt fatigues easily and becomes SOB w/ limited activity (sit to stand from EOB) noted Toilet Transfer: Minimal assistance;Stand-pivot;BSC (Simulated transfer/SPT EOB to chair and back to bed after rest secondary to going for x-ray) Toilet Transfer Details (indicate cue type and reason): Hand held assist +1 Toileting- Clothing Manipulation and Hygiene: Minimal assistance;Sit to/from stand (Simulated)   Tub/ Banker:  (NT)   Functional mobility during ADLs: Minimal assistance;Cueing for safety;Cueing for sequencing (Hand held assist +1) General ADL Comments: Pt appears deconditioned and fatigues easily. He was noted to become SOB w/ initial sit to stand and requires verbal and tactile cues throughout session for pursed lip breathing techniques. Pt was educated in role of OT and participated in transfer and functional mobiilty training today/ Pt required HOB elevated for supine to sit. He should benefit from acute OT to address deconditiong and generalized weakness affecting his ability to perform functional mobility/transfers and ADL 's as pt  was Mod I prior to this admission.     Vision  No change from baseline, wears reading glasses as well as glasses for distance (watchnig tv).                   Perception     Praxis      Pertinent Vitals/Pain Pain Assessment: 0-10 Pain Score: 3  Pain Location: Chest  Pain Descriptors /  Indicators: Aching Pain Intervention(s): Limited activity within patient's tolerance;Monitored during session;Repositioned;Relaxation     Hand Dominance Right   Extremity/Trunk Assessment Upper Extremity Assessment Upper Extremity Assessment: Generalized weakness   Lower Extremity Assessment Lower Extremity Assessment: Defer to PT  evaluation       Communication Communication Communication: No difficulties   Cognition Arousal/Alertness: Awake/alert Behavior During Therapy: WFL for tasks assessed/performed Overall Cognitive Status: Within Functional Limits for tasks assessed         Following Commands: Follows one step commands consistently     Problem Solving: Requires verbal cues;Requires tactile cues     General Comments       Exercises       Shoulder Instructions      Home Living Family/patient expects to be discharged to:: Private residence Living Arrangements: Children Available Help at Discharge: Family Type of Home: House Home Access: Stairs to enter Technical brewer of Steps: 2 Entrance Stairs-Rails: Right Home Layout: One level     Bathroom Shower/Tub: Occupational psychologist: Handicapped height     Home Equipment: Level Green - single point;Shower seat - built in          Prior Functioning/Environment Level of Independence: Independent             OT Diagnosis: Generalized weakness;Acute pain   OT Problem List: Decreased strength;Decreased activity tolerance;Impaired balance (sitting and/or standing);Decreased knowledge of use of DME or AE;Cardiopulmonary status limiting activity;Pain   OT Treatment/Interventions: Self-care/ADL training;DME and/or AE instruction;Patient/family education;Therapeutic activities;Balance training    OT Goals(Current goals can be found in the care plan section) Acute Rehab OT Goals Patient Stated Goal: none stated Time For Goal Achievement: 07/08/14 Potential to Achieve Goals: Good  OT Frequency: Min 2X/week   Barriers to D/C:            Co-evaluation              End of Session Equipment Utilized During Treatment: Gait belt Nurse Communication: Other (comment) (Pt going to x-ray)  Activity Tolerance: Patient limited by fatigue;Patient limited by lethargy Patient left: in bed;with call bell/phone within reach    Time: 1006-1032 OT Time Calculation (min): 26 min Charges:  OT General Charges $OT Visit: 1 Procedure OT Evaluation $Initial OT Evaluation Tier I: 1 Procedure OT Treatments $Therapeutic Activity: 8-22 mins G-Codes:    Almyra Deforest , OTR/L  06/24/2014, 10:47 AM

## 2014-06-25 DIAGNOSIS — R609 Edema, unspecified: Secondary | ICD-10-CM

## 2014-06-25 DIAGNOSIS — J9601 Acute respiratory failure with hypoxia: Secondary | ICD-10-CM

## 2014-06-25 LAB — BASIC METABOLIC PANEL
Anion gap: 6 (ref 5–15)
BUN: 36 mg/dL — ABNORMAL HIGH (ref 6–23)
CO2: 26 mmol/L (ref 19–32)
CREATININE: 1.68 mg/dL — AB (ref 0.50–1.35)
Calcium: 8.3 mg/dL — ABNORMAL LOW (ref 8.4–10.5)
Chloride: 104 mmol/L (ref 96–112)
GFR calc Af Amer: 42 mL/min — ABNORMAL LOW (ref >90)
GFR calc non Af Amer: 36 mL/min — ABNORMAL LOW (ref >90)
Glucose, Bld: 110 mg/dL — ABNORMAL HIGH (ref 70–99)
POTASSIUM: 3.6 mmol/L (ref 3.5–5.1)
Sodium: 136 mmol/L (ref 135–145)

## 2014-06-25 LAB — CULTURE, BLOOD (ROUTINE X 2)
Culture: NO GROWTH
Culture: NO GROWTH

## 2014-06-25 LAB — CBC
HEMATOCRIT: 27.4 % — AB (ref 39.0–52.0)
Hemoglobin: 8.9 g/dL — ABNORMAL LOW (ref 13.0–17.0)
MCH: 30.2 pg (ref 26.0–34.0)
MCHC: 32.5 g/dL (ref 30.0–36.0)
MCV: 92.9 fL (ref 78.0–100.0)
Platelets: 289 10*3/uL (ref 150–400)
RBC: 2.95 MIL/uL — AB (ref 4.22–5.81)
RDW: 14.2 % (ref 11.5–15.5)
WBC: 9.2 10*3/uL (ref 4.0–10.5)

## 2014-06-25 LAB — TROPONIN I: TROPONIN I: 0.04 ng/mL — AB (ref <0.031)

## 2014-06-25 MED ORDER — PHYTONADIONE 5 MG PO TABS
5.0000 mg | ORAL_TABLET | Freq: Once | ORAL | Status: AC
  Administered 2014-06-25: 5 mg via ORAL
  Filled 2014-06-25: qty 1

## 2014-06-25 MED ORDER — POTASSIUM CHLORIDE 20 MEQ/15ML (10%) PO SOLN
40.0000 meq | Freq: Once | ORAL | Status: AC
  Administered 2014-06-25: 40 meq via ORAL
  Filled 2014-06-25: qty 30

## 2014-06-25 MED ORDER — FLUCONAZOLE 50 MG PO TABS
50.0000 mg | ORAL_TABLET | Freq: Every day | ORAL | Status: DC
  Filled 2014-06-25: qty 1

## 2014-06-25 MED ORDER — FLUCONAZOLE 100 MG PO TABS
100.0000 mg | ORAL_TABLET | Freq: Once | ORAL | Status: AC
  Administered 2014-06-25: 100 mg via ORAL
  Filled 2014-06-25: qty 1

## 2014-06-25 NOTE — Progress Notes (Signed)
PHARMACIST-PHYSICIAN COMMUNICATION  Topic:  Drug interactions with fluconazole  The following warnings exist for co-administration of fluconazole with other medications on Edward [name]' profile.   The combination of fluconazole and amiodarone may cause additive prolongation of the QT interval, increasing the risk for potentially life-threatening cardiac arrhythmias.  The risk is greater in the presence of hypokalemia, reduced left ventricular function, and bradycardia.  Close monitoring for clinical and ECG signs of QT prolongation and/or cardiac arrhythmias is indicated.   Plasma concentrations of colchicine may be increased by fluconazole, causing colchicine toxicity.  The recommended colchicine dose for the treatment of a gout flare in patients receiving fluconazole is 1.2 mg for one dose.  A 3-day lapse should occur before subsequent colchicine administration.  LenoxPh. 06/25/2014 9:04 PM

## 2014-06-25 NOTE — Progress Notes (Signed)
Subjective:  Says breathing is better, but will had some shortness of breath earlier this morning.  Weight is now down to about 5 pounds from yesterday.  Objective:  Vital Signs in the last 24 hours: BP 101/57 mmHg  Pulse 92  Temp(Src) 98.1 F (36.7 C) (Oral)  Resp 16  Ht 5\' 10"  (1.778 m)  Wt 79.47 kg (175 lb 3.2 oz)  BMI 25.14 kg/m2  SpO2 97%  Physical Exam: Pleasant WM in NAD Lungs:  Reduced breath sounds in bases Cardiac:  Regular rhythm, normal S1 and S2, no S3, 2/6 systolic murmur Extremities: 1+ edema present  Intake/Output from previous day: 01/26 0701 - 01/27 0700 In: 240 [P.O.:240] Out: 1476 [Urine:1475; Stool:1] Weight Filed Weights   06/23/14 0537 06/24/14 0549 06/25/14 0450  Weight: 83.6 kg (184 lb 4.9 oz) 81.92 kg (180 lb 9.6 oz) 79.47 kg (175 lb 3.2 oz)    Lab Results: Basic Metabolic Panel:  Recent Labs  06/24/14 0805 06/25/14 0201  NA 135 136  K 4.4 3.6  CL 103 104  CO2 24 26  GLUCOSE 96 110*  BUN 39* 36*  CREATININE 1.79* 1.68*    CBC:  Recent Labs  06/24/14 1430 06/25/14 0201  WBC 12.1* 9.2  HGB 9.8* 8.9*  HCT 31.0* 27.4*  MCV 94.2 92.9  PLT 317 289   Assessment/Plan:   1.  Acute on combined systolic and diastolic heart failure with weight loss since here and good diuresis. 2.  Stage 3-4 chronic kidney disease-stable 3.  Coronary artery disease with previous bypass graft and previous infarct. 4.  Recent pneumonia.  Recommendations:  We'll get pacemaker to be interrogated today.  Continue intravenous diuresis today.  Continue to follow renal function.      Kerry Hough  MD [facility]  06/25/2014, 1:37 PM

## 2014-06-25 NOTE — Progress Notes (Signed)
VASCULAR LAB PRELIMINARY  PRELIMINARY  PRELIMINARY  PRELIMINARY  Bilateral lower extremity venous duplex completed.    Preliminary report:  Bilateral:  No evidence of DVT, superficial thrombosis, or Baker's Cyst.   Blue Ruggerio, RVS 06/25/2014, 1:58 PM

## 2014-06-25 NOTE — Progress Notes (Signed)
Physical Therapy Treatment Patient Details Name: Edward [name] MRN: [id number] DOB: 09-Jul-1929 Today's Date: 06/25/2014    History of Present Illness 81 yo male s/p TURBT 06/16/14. Hx of COPD, CAD, pacemaker, A fib, AAA, HTN, PVD, BPH    PT Comments    *Pt continues to desaturate when walking, SaO2 80% on RA with walking. At rest SaO2 97% on RA. At present he will need to go home with O2. **  Follow Up Recommendations  Home health PT;Supervision/Assistance - 24 hour (may need to consider SNF if 24 hour is not available)     Equipment Recommendations  Rolling walker with 5" wheels;Other (comment) (oxygen)    Recommendations for Other Services OT consult     Precautions / Restrictions Precautions Precautions: Fall Precaution Comments: monitor O2 Restrictions Weight Bearing Restrictions: No    Mobility  Bed Mobility Overal bed mobility: Needs Assistance Bed Mobility: Supine to Sit     Supine to sit: Min assist;HOB elevated Sit to supine: Min assist;HOB elevated   General bed mobility comments: Increased time. Assist for raising trunk. Cues for technique.  Transfers Overall transfer level: Needs assistance Equipment used: Rolling walker (2 wheeled) Transfers: Sit to/from Stand Sit to Stand: Min assist         General transfer comment: Assist to rise, stabilize; cues needed for hand placement  Ambulation/Gait Ambulation/Gait assistance: Supervision Ambulation Distance (Feet): 160 Feet Assistive device: Rolling walker (2 wheeled) Gait Pattern/deviations: Step-to pattern;Trunk flexed;Decreased stride length     General Gait Details: O2 sats 80% on RA with walking, dyspnea 3/4, fatigues quickly.    Stairs            Wheelchair Mobility    Modified Rankin (Stroke Patients Only)       Balance   Sitting-balance support: Feet supported Sitting balance-Leahy Scale: Good       Standing balance-Leahy Scale: Fair                       Cognition Arousal/Alertness: Awake/alert Behavior During Therapy: WFL for tasks assessed/performed Overall Cognitive Status: Within Functional Limits for tasks assessed                      Exercises      General Comments        Pertinent Vitals/Pain Pain Assessment: No/denies pain Pain Score: 0-No pain    Home Living                      Prior Function            PT Goals (current goals can now be found in the care plan section) Acute Rehab PT Goals Patient Stated Goal: none stated PT Goal Formulation: With patient/family Time For Goal Achievement: 07/03/14 Potential to Achieve Goals: Good Progress towards PT goals: Progressing toward goals    Frequency  Min 3X/week    PT Plan Current plan remains appropriate    Co-evaluation             End of Session Equipment Utilized During Treatment: Gait belt;Oxygen Activity Tolerance: Patient limited by fatigue Patient left: in chair;with call bell/phone within reach;with family/visitor present (instructed son to make nursing aware if he leaves or if pt wants to get back into bed)     Time: 6270-3500 PT Time Calculation (min) (ACUTE ONLY): 18 min  Charges:  $Gait Training: 8-22 mins  G Codes:      Philomena Doheny 06/25/2014, 2:49 PM [phone]

## 2014-06-25 NOTE — Progress Notes (Signed)
9 Days Post-Op Subjective: Patient reports that he is breathing more easily now. He had a bowel movement this morning.  I appreciate input from Dr. Wynonia Lawman as well as Dr. Tyrell Antonio  Objective: Vital signs in last 24 hours: Temp:  [98.1 F (36.7 C)-98.4 F (36.9 C)] 98.1 F (36.7 C) (01/27 0450) Pulse Rate:  [70-92] 92 (01/27 0450) Resp:  [16-20] 16 (01/27 0450) BP: (101-130)/(53-62) 101/57 mmHg (01/27 0450) SpO2:  [94 %-100 %] 96 % (01/27 0450) Weight:  [79.47 kg (175 lb 3.2 oz)] 79.47 kg (175 lb 3.2 oz) (01/27 0450)  Intake/Output from previous day: 01/26 0701 - 01/27 0700 In: 240 [P.O.:240] Out: 1476 [Urine:1475; Stool:1] Intake/Output this shift: Total I/O In: -  Out: 2951 [Urine:1200; Stool:1]  Physical Exam:  Constitutional: Vital signs reviewed. WD WN in NAD   Eyes: PERRL, No scleral icterus.   Cardiovascular: RRR Pulmonary/Chest: Normal effort, decreased breath sounds at the bases. Few rales noted. Abdominal: Soft. Non-tender, non-distended, bowel sounds are normal, no masses, organomegaly, or guarding present.  Genitourinary: Extremities: No cyanosis, 1-2+ pitting edema bilaterally.   Lab Results:  Recent Labs  06/23/14 0450 06/24/14 1430 06/25/14 0201  HGB 9.5* 9.8* 8.9*  HCT 30.0* 31.0* 27.4*   BMET  Recent Labs  06/24/14 0805 06/25/14 0201  NA 135 136  K 4.4 3.6  CL 103 104  CO2 24 26  GLUCOSE 96 110*  BUN 39* 36*  CREATININE 1.79* 1.68*  CALCIUM 8.4 8.3*    Recent Labs  06/24/14 1445  INR 2.12*   No results for input(s): LABURIN in the last 72 hours. Results for orders placed or performed during the hospital encounter of 06/16/14  Culture, blood (routine x 2)     Status: None (Preliminary result)   Collection Time: 06/18/14  8:33 PM  Result Value Ref Range Status   Specimen Description BLOOD LEFT ARM  Final   Special Requests BOTTLES DRAWN AEROBIC AND ANAEROBIC 10CC  Final   Culture   Final           BLOOD CULTURE RECEIVED NO GROWTH  TO DATE CULTURE WILL BE HELD FOR 5 DAYS BEFORE ISSUING A FINAL NEGATIVE REPORT Performed at [facility]    Report Status PENDING  Incomplete  Culture, blood (routine x 2)     Status: None (Preliminary result)   Collection Time: 06/18/14  8:38 PM  Result Value Ref Range Status   Specimen Description BLOOD LEFT ARM  Final   Special Requests BOTTLES DRAWN AEROBIC ONLY 5 CC  Final   Culture   Final           BLOOD CULTURE RECEIVED NO GROWTH TO DATE CULTURE WILL BE HELD FOR 5 DAYS BEFORE ISSUING A FINAL NEGATIVE REPORT Performed at [facility]    Report Status PENDING  Incomplete  Culture, Urine     Status: None   Collection Time: 06/23/14 10:31 AM  Result Value Ref Range Status   Specimen Description URINE, CLEAN CATCH  Final   Special Requests augmentin Immunocompromised  Final   Colony Count   Final    65,000 COLONIES/ML Performed at [place] Performed at [facility]   Final   Report Status 06/24/2014 FINAL  Final    Studies/Results: Dg Chest 2 View  06/24/2014   CLINICAL DATA:  Shortness of breath.  EXAM: CHEST  2 VIEW  COMPARISON:  June 18, 2014.  FINDINGS: Stable cardiomediastinal silhouette. Status post coronary  artery bypass graft. Left-sided pacemaker is unchanged in position. No pneumothorax is noted. Mild bilateral pleural effusions are noted. Mild central pulmonary vascular congestion is noted with increased bibasilar interstitial densities concerning for pulmonary edema. Stable right upper lobe opacity is noted concerning for edema or pneumonia.  IMPRESSION: Mildly increased basilar interstitial densities are noted concerning for pulmonary edema with associated pleural effusions. Stable right upper lobe opacity is noted concerning for edema or possibly pneumonia.   Electronically Signed   By: Sabino Dick M.D.   On: 06/24/2014 12:57   US Renal  06/23/2014   CLINICAL DATA:  Renal failure. Recent transurethral  resection of bladder tumor  EXAM: RENAL/URINARY TRACT ULTRASOUND COMPLETE  COMPARISON:  CT abdomen and pelvis June 11, 2014  FINDINGS: Right Kidney:  Length: 12.5 cm. Echogenicity within normal limits. There is renal cortical thinning. There is no pelvicaliectasis or perinephric fluid. There is no demonstrable mass. There is no sonographically demonstrable calculus or ureterectasis.  Left Kidney:  Length: 10.7 cm. Echogenicity within normal limits. There is renal cortical thinning. There are several cysts in the mid to lower pole left kidney. The largest cyst measures 3.0 x 2.8 x 2.9 cm and is toward the lower pole. In the mid portion, there is a 2.8 x 2.8 x 2.4 cm cyst as well as a nearby 2.1 x 1.9 x 1.7 cm cyst. There is no pelvicaliectasis or perinephric fluid. No sonographically demonstrable calculus or ureterectasis.  Bladder:  There is a complex mass in the inferior aspect of the urinary bladder which contains moderate fluid consistent with recently biopsied mass. The wall of the urinary bladder is not appreciably thickened. This mass currently measures 3.5 x 3.1 cm.  IMPRESSION: Both kidneys show renal cortical thinning consistent with medical renal disease. No obstructing foci in either kidney. There are 3 cysts in the left kidney peripherally. There is a complex lesion in the urinary bladder consistent with recently biopsied bladder tumor.   Electronically Signed   By: Lowella Grip M.D.   On: 06/23/2014 14:26   Nm Pulmonary Perf And Vent  06/24/2014   CLINICAL DATA:  Chest pain and shortness of breath.  EXAM: NUCLEAR MEDICINE VENTILATION - PERFUSION LUNG SCAN  TECHNIQUE: Ventilation images were obtained in multiple projections using inhaled aerosol technetium 99 M DTPA. Perfusion images were obtained in multiple projections after intravenous injection of Tc-33m MAA.  RADIOPHARMACEUTICALS:  36.0 mCi Tc-5m DTPA aerosol and 5.0 mCi Tc-102m MAA  COMPARISON:  PA and lateral chest x-ray of today's date   FINDINGS: Ventilation: There is patchy deposition of the radiopharmaceutical in the central airways with evidence of patchy ventilation within both lungs.  Perfusion: No wedge shaped peripheral perfusion defects to suggest acute pulmonary embolism. There is a photopenic region consistent with the patient's pacemaker generator.  IMPRESSION: The findings are consistent with a low probability for acute pulmonary embolism.   Electronically Signed   By: David  Martinique   On: 06/24/2014 15:00    Assessment/Plan:   Postop day #9 TURBT with extended stay secondary to bleeding, most likely related to large tumor resection and probable mild anticoagulation. His urine is clearing. There is a small clot was bladder based on ultrasound.    Recent respiratory distress probably secondary to fluid overload, resolving    Renal function is stable/slightly better, it looks like he is diuresing appropriately.    Continue physical therapy, maximize nutrition.   LOS: 9 days   Franchot Gallo M 06/25/2014, 6:56 AM

## 2014-06-25 NOTE — Progress Notes (Signed)
[facility] PROGRESS NOTE  Edward [name] RCV:[id number] DOB: Nov 09, 1929 DOA: 06/16/2014 PCP: Gwendolyn Grant, MD  Summary I have seen and examined Edward [name] at bedside in the presence of his daughter and reviewed his chart. Hospitalist service is following for medical co management including HCAP/AKI.  Appreciate Cardiology/GU. Edward [name] is a pleasant 81 year old male with a history of COPD, coronary artery disease, carotid artery disease, dyslipidemia, status post pacemaker placement, afib on Coumadin at admission and is now status post s/p TURBT. He is on treatment for Acute exacerbation of CHF(40-45%)- combined diastolic and systolic as well as HCAP. He also has anemia from acute blood loss from GU bleed. Coumadin on hold but INR>2. Will give dose of vitamin k, continue antibiotics and defer to cardiology for CHF management. He grew an insiginificant number of colonies of yeast in urine, but will err on the side of treatment of the yeast in view of recent manipulation. Assessment/Plan: 1-Acute hypoxic Respiratory Failure in the setting of HCAP  -Will continue Augmentin (day 3/5-7) -continue flutter valve along with ICS -continue Mucinex, nebulizer treatments. -V-Q scan suggested low probability for PE. Venous doppler shows no dvt.  2-Acute Chronic combined systolic/diastolic HF; -no ACE/ARB due to renal failure with recent AKI -follow daily weight -IV lasix.  -Follow Cardiology recommendations.   3-Acute Renal Failure/Candiduria;  -Baseline creatinine around 1.6-1.9 at baseline -Cr is 1.68  today -Avoid hypotension, continue holding nephrotoxic agents.  -NSL fluid due to concern for pulmonary edema.  -renal US showed "...a complex lesion in the urinary bladder consistent with recently biopsied bladder tumor" -urine cx showed insignificant number of colonies of yeast, however, will treat in view of recent manipulation of GU tract.  4-Atrial fibrillation status post  Ablation, status post pacemaker patient to continue with amiodarone  hold anticoagulation until hematuria resolves completely (urology to help deciding when is safe to anticoagulate again)   5-ileus:improving clinically. Will continue diet as tolerated  6-Hyponatremia: improved/stable; will monitor. Hold IV fluids due to concern for pulmonary edema.   7-Hematuria, TURP; post surgery and in setting of anticoagulation. Repeat hgb has remained relatively stable(8.9g/dl today) and there is no need for transfusion currently. Will monitor. Give dose of vitamin k  8-mild elevated troponin. In setting renal failure. ekg with ventricular paced rhythm.   9-delirium: multifactorial: hospital acquired, PNA, use of narcotics and inability to sleep/rest with ongoing bladder spasm and pain. -will continue minimizing narcotics; start schedule tylenol and will follow urology rec's for bladder spasms and retention -mentation is better and overall condition improving. Patient AAOX2   10-left upper extremity:  -neg LUE duplex for DVT and/or SVT -continue arm elevation -appears to be secondary to 3rd shift spacing  Code Status: full code.  Family Communication: care discussed with daughter, who was at bedside.  Disposition Plan: remain inpatient.    Procedures - Left ventricle: The cavity size was normal. There was moderate concentric hypertrophy. Systolic function was mildly to moderately reduced. The estimated ejection fraction was in the range of 40% to 45%. Moderate hypokinesis of the apical myocardium. Mild hypokinesis of the inferior myocardium. Features are consistent with a pseudonormal left ventricular filling pattern, with concomitant abnormal relaxation and increased filling pressure (grade 2 diastolic dysfunction). - Aortic valve: Valve mobility was mildly restricted. There was mild to moderate stenosis. There was mild regurgitation. Valve area (VTI): 1.23 cm^2.  Valve area (Vmax): 0.93 cm^2. Valve area (Vmean): 0.9 cm^2. - Mitral valve: Mildly to moderately calcified annulus. There was moderate regurgitation. -  Left atrium: The atrium was mildly to moderately dilated. - Right atrium: The atrium was mildly dilated. - Pulmonary arteries: Systolic pressure was moderately increased. PA peak pressure: 55 mm Hg (S).  Antibiotics:  Vancomycin 1-20>>1/24  Zosyn 1-20>>1/24  Augmentin 1/24>>1/29 (plan is to complete 5 more days of tx)  Fluconazole 1/27>  HPI/Subjective: Feels better. Less dyspneic.  Objective: Filed Vitals:   06/25/14 1446  BP: 105/73  Pulse: 78  Temp: 98.4 F (36.9 C)  Resp:     Intake/Output Summary (Last 24 hours) at 06/25/14 1532 Last data filed at 06/25/14 1453  Gross per 24 hour  Intake    440 ml  Output   3301 ml  Net  -2861 ml   Filed Weights   06/23/14 0537 06/24/14 0549 06/25/14 0450  Weight: 83.6 kg (184 lb 4.9 oz) 81.92 kg (180 lb 9.6 oz) 79.47 kg (175 lb 3.2 oz)    Exam:   General:  Sitting up in recliner chair, not in distress.  Cardiovascular: S1s2 heard.  Respiratory: Wet cough.  Abdomen: soft, non tender. +BS  Musculoskeletal: No pedal edema.  Data Reviewed: Basic Metabolic Panel:  Recent Labs Lab 06/20/14 0542 06/21/14 0833 06/23/14 0450 06/24/14 0805 06/25/14 0201  NA 135 133* 137 135 136  K 4.4 4.9 4.5 4.4 3.6  CL 103 103 104 103 104  CO2 24 23 23 24 26   GLUCOSE 113* 93 107* 96 110*  BUN 36* 34* 47* 39* 36*  CREATININE 1.92* 1.92* 2.10* 1.79* 1.68*  CALCIUM 8.2* 8.3* 8.7 8.4 8.3*   Liver Function Tests:  Recent Labs Lab 06/18/14 1708  AST 34  ALT 17  ALKPHOS 92  BILITOT 1.1  PROT 5.4*  ALBUMIN 2.6*   No results for input(s): LIPASE, AMYLASE in the last 168 hours. No results for input(s): AMMONIA in the last 168 hours. CBC:  Recent Labs Lab 06/20/14 0542 06/21/14 0833 06/23/14 0450 06/24/14 1430 06/25/14 0201  WBC 8.4 10.9* 13.5* 12.1* 9.2  HGB  8.8* 9.4* 9.5* 9.8* 8.9*  HCT 27.7* 29.9* 30.0* 31.0* 27.4*  MCV 94.5 94.6 94.6 94.2 92.9  PLT 221 243 297 317 289   Cardiac Enzymes:  Recent Labs Lab 06/18/14 2245 06/19/14 0432 06/24/14 1445 06/24/14 2031 06/25/14 0201  TROPONINI 0.04* 0.04* 0.03 0.04* 0.04*   BNP (last 3 results) No results for input(s): PROBNP in the last 8760 hours. CBG: No results for input(s): GLUCAP in the last 168 hours.  Recent Results (from the past 240 hour(s))  Culture, blood (routine x 2)     Status: None   Collection Time: 06/18/14  8:33 PM  Result Value Ref Range Status   Specimen Description BLOOD LEFT ARM  Final   Special Requests BOTTLES DRAWN AEROBIC AND ANAEROBIC 10CC  Final   Culture   Final    NO GROWTH 5 DAYS Performed at [facility]    Report Status 06/25/2014 FINAL  Final  Culture, blood (routine x 2)     Status: None   Collection Time: 06/18/14  8:38 PM  Result Value Ref Range Status   Specimen Description BLOOD LEFT ARM  Final   Special Requests BOTTLES DRAWN AEROBIC ONLY 5 CC  Final   Culture   Final    NO GROWTH 5 DAYS Performed at [facility]    Report Status 06/25/2014 FINAL  Final  Culture, Urine     Status: None   Collection Time: 06/23/14 10:31 AM  Result Value Ref Range Status  Specimen Description URINE, CLEAN CATCH  Final   Special Requests augmentin Immunocompromised  Final   Colony Count   Final    65,000 COLONIES/ML Performed at [place] Performed at [facility]   Final   Report Status 06/24/2014 FINAL  Final     Studies: Dg Chest 2 View  06/24/2014   CLINICAL DATA:  Shortness of breath.  EXAM: CHEST  2 VIEW  COMPARISON:  June 18, 2014.  FINDINGS: Stable cardiomediastinal silhouette. Status post coronary artery bypass graft. Left-sided pacemaker is unchanged in position. No pneumothorax is noted. Mild bilateral pleural effusions are noted. Mild central pulmonary vascular congestion is noted  with increased bibasilar interstitial densities concerning for pulmonary edema. Stable right upper lobe opacity is noted concerning for edema or pneumonia.  IMPRESSION: Mildly increased basilar interstitial densities are noted concerning for pulmonary edema with associated pleural effusions. Stable right upper lobe opacity is noted concerning for edema or possibly pneumonia.   Electronically Signed   By: Sabino Dick M.D.   On: 06/24/2014 12:57   Nm Pulmonary Perf And Vent  06/24/2014   CLINICAL DATA:  Chest pain and shortness of breath.  EXAM: NUCLEAR MEDICINE VENTILATION - PERFUSION LUNG SCAN  TECHNIQUE: Ventilation images were obtained in multiple projections using inhaled aerosol technetium 99 M DTPA. Perfusion images were obtained in multiple projections after intravenous injection of Tc-52m MAA.  RADIOPHARMACEUTICALS:  36.0 mCi Tc-48m DTPA aerosol and 5.0 mCi Tc-29m MAA  COMPARISON:  PA and lateral chest x-ray of today's date  FINDINGS: Ventilation: There is patchy deposition of the radiopharmaceutical in the central airways with evidence of patchy ventilation within both lungs.  Perfusion: No wedge shaped peripheral perfusion defects to suggest acute pulmonary embolism. There is a photopenic region consistent with the patient's pacemaker generator.  IMPRESSION: The findings are consistent with a low probability for acute pulmonary embolism.   Electronically Signed   By: David  Martinique   On: 06/24/2014 15:00    Scheduled Meds: . acetaminophen  1,000 mg Oral 3 times per day  . amiodarone  200 mg Oral Daily  . amoxicillin-clavulanate  1 tablet Oral TID  . budesonide (PULMICORT) nebulizer solution  0.25 mg Nebulization BID  . docusate sodium  100 mg Oral BID  . furosemide  40 mg Intravenous Q12H  . lactose free nutrition  237 mL Oral BID BM  . levalbuterol  1.25 mg Nebulization Q6H WA  . polyethylene glycol  17 g Oral QPC supper  . potassium chloride  40 mEq Oral Once  . rosuvastatin  20 mg Oral  Daily  . senna  1 tablet Oral Daily  . tamsulosin  0.4 mg Oral QHS  . tiotropium  18 mcg Inhalation Daily   Continuous Infusions:   Active Problems:   Cancer of bladder wall   Protein-calorie malnutrition, severe   Hypotension   PNA (pneumonia)   Acute respiratory failure    Time spent: 25 minutes.    Erianna Jolly  [facility] Pager [phone]. If 7PM-7AM, please contact night-coverage at [email or web address], password [facility] 06/25/2014, 3:32 PM  LOS: 9 days

## 2014-06-26 LAB — PROTIME-INR
INR: 1.7 — AB (ref 0.00–1.49)
Prothrombin Time: 20.1 seconds — ABNORMAL HIGH (ref 11.6–15.2)

## 2014-06-26 LAB — CBC
HCT: 30.4 % — ABNORMAL LOW (ref 39.0–52.0)
Hemoglobin: 9.7 g/dL — ABNORMAL LOW (ref 13.0–17.0)
MCH: 29.8 pg (ref 26.0–34.0)
MCHC: 31.9 g/dL (ref 30.0–36.0)
MCV: 93.3 fL (ref 78.0–100.0)
PLATELETS: 296 10*3/uL (ref 150–400)
RBC: 3.26 MIL/uL — AB (ref 4.22–5.81)
RDW: 14.1 % (ref 11.5–15.5)
WBC: 9.5 10*3/uL (ref 4.0–10.5)

## 2014-06-26 LAB — BASIC METABOLIC PANEL
Anion gap: 9 (ref 5–15)
BUN: 30 mg/dL — AB (ref 6–23)
CHLORIDE: 99 mmol/L (ref 96–112)
CO2: 32 mmol/L (ref 19–32)
CREATININE: 1.55 mg/dL — AB (ref 0.50–1.35)
Calcium: 8.5 mg/dL (ref 8.4–10.5)
GFR calc Af Amer: 46 mL/min — ABNORMAL LOW (ref >90)
GFR calc non Af Amer: 40 mL/min — ABNORMAL LOW (ref >90)
Glucose, Bld: 91 mg/dL (ref 70–99)
Potassium: 3.2 mmol/L — ABNORMAL LOW (ref 3.5–5.1)
SODIUM: 140 mmol/L (ref 135–145)

## 2014-06-26 MED ORDER — WARFARIN 0.5 MG HALF TABLET
0.5000 mg | ORAL_TABLET | Freq: Once | ORAL | Status: AC
  Administered 2014-06-26: 0.5 mg via ORAL
  Filled 2014-06-26 (×2): qty 1

## 2014-06-26 MED ORDER — POTASSIUM CHLORIDE 20 MEQ/15ML (10%) PO SOLN
40.0000 meq | Freq: Two times a day (BID) | ORAL | Status: AC
  Administered 2014-06-26 (×2): 40 meq via ORAL
  Filled 2014-06-26 (×2): qty 30

## 2014-06-26 MED ORDER — FUROSEMIDE 40 MG PO TABS
40.0000 mg | ORAL_TABLET | Freq: Every day | ORAL | Status: DC
  Administered 2014-06-26: 40 mg via ORAL
  Filled 2014-06-26: qty 1

## 2014-06-26 MED ORDER — WARFARIN - PHARMACIST DOSING INPATIENT: Freq: Every day | Status: DC

## 2014-06-26 NOTE — Progress Notes (Signed)
Subjective:  Had a good night.  His son reports that he is close to baseline except for his weakness.  He has had a good diuresis and is now -4 L.  No chest pain.  Interrogation of his pacemaker yesterday showed that he went into atrial flutter on 1/22.  Objective:  Vital Signs in the last 24 hours: BP 124/44 mmHg  Pulse 80  Temp(Src) 98.1 F (36.7 C) (Oral)  Resp 19  Ht 5\' 10"  (1.778 m)  Wt 75.978 kg (167 lb 8 oz)  BMI 24.03 kg/m2  SpO2 97%  Physical Exam: Pleasant WM in NAD Lungs:  Reduced breath sounds in bases Cardiac:  Regular rhythm, normal S1 and S2, no S3, 2/6 systolic murmur Extremities:no edema present  Intake/Output from previous day: 01/27 0701 - 01/28 0700 In: 380 [P.O.:380] Out: 4150 [Urine:4150] Weight Filed Weights   06/24/14 0549 06/25/14 0450 06/26/14 0523  Weight: 81.92 kg (180 lb 9.6 oz) 79.47 kg (175 lb 3.2 oz) 75.978 kg (167 lb 8 oz)    Lab Results: Basic Metabolic Panel:  Recent Labs  06/25/14 0201 06/26/14 0530  NA 136 140  K 3.6 3.2*  CL 104 99  CO2 26 32  GLUCOSE 110* 91  BUN 36* 30*  CREATININE 1.68* 1.55*    CBC:  Recent Labs  06/25/14 0201 06/26/14 0530  WBC 9.2 9.5  HGB 8.9* 9.7*  HCT 27.4* 30.4*  MCV 92.9 93.3  PLT 289 296   Assessment/Plan:   1.  Acute on combined systolic and diastolic heart failure with weight loss since here and good diuresis appears to be back at baseline 2.  Stage 3-4 chronic kidney disease-stable and renal function appears to be improving 3.  Coronary artery disease with previous bypass graft and previous infarct. 4.  Recent pneumonia appears to be improving. 5.  Recurrent atrial arrhythmias with recurrent atrial flutter since the 22nd  Recommendations:  He went into atrial flutter on the 22nd that may account for some of his decompensation.  He will need to be anticoagulated for 3-4 weeks before we can cardiovert this back to sinus rhythm.   Would like to restart warfarin if okay with  urology.  In addition, I think that we can change him back to oral diuretics at this point in time and continue to follow renal function.  Continue physical therapy and OT.     Kerry Hough  MD [facility]  06/26/2014, 8:39 AM

## 2014-06-26 NOTE — Progress Notes (Signed)
10 Days Post-Op Subjective: Patient reports feeling weak but better. Still some urinary urgency. Has a condom cath on.  Objective: Vital signs in last 24 hours: Temp:  [98.1 F (36.7 C)-98.4 F (36.9 C)] 98.4 F (36.9 C) (01/28 0943) Pulse Rate:  [74-85] 85 (01/28 0943) Resp:  [16-19] 19 (01/28 0943) BP: (105-129)/(44-73) 118/56 mmHg (01/28 0943) SpO2:  [80 %-99 %] 96 % (01/28 0943) Weight:  [75.978 kg (167 lb 8 oz)] 75.978 kg (167 lb 8 oz) (01/28 0523)  Intake/Output from previous day: 01/27 0701 - 01/28 0700 In: 380 [P.O.:380] Out: 4150 [Urine:4150] Intake/Output this shift: Total I/O In: -  Out: 925 [Urine:925]  Physical Exam:  Constitutional: Vital signs reviewed. WD WN in NAD   Eyes: PERRL, No scleral icterus.   Pulmonary/Chest: Normal effort    Urine is much clearer.  Lab Results:  Recent Labs  06/24/14 1430 06/25/14 0201 06/26/14 0530  HGB 9.8* 8.9* 9.7*  HCT 31.0* 27.4* 30.4*   BMET  Recent Labs  06/25/14 0201 06/26/14 0530  NA 136 140  K 3.6 3.2*  CL 104 99  CO2 26 32  GLUCOSE 110* 91  BUN 36* 30*  CREATININE 1.68* 1.55*  CALCIUM 8.3* 8.5    Recent Labs  06/24/14 1445 06/26/14 0530  INR 2.12* 1.70*   No results for input(s): LABURIN in the last 72 hours. Results for orders placed or performed during the hospital encounter of 06/16/14  Culture, blood (routine x 2)     Status: None   Collection Time: 06/18/14  8:33 PM  Result Value Ref Range Status   Specimen Description BLOOD LEFT ARM  Final   Special Requests BOTTLES DRAWN AEROBIC AND ANAEROBIC 10CC  Final   Culture   Final    NO GROWTH 5 DAYS Performed at [facility]    Report Status 06/25/2014 FINAL  Final  Culture, blood (routine x 2)     Status: None   Collection Time: 06/18/14  8:38 PM  Result Value Ref Range Status   Specimen Description BLOOD LEFT ARM  Final   Special Requests BOTTLES DRAWN AEROBIC ONLY 5 CC  Final   Culture   Final    NO GROWTH 5  DAYS Performed at [facility]    Report Status 06/25/2014 FINAL  Final  Culture, Urine     Status: None   Collection Time: 06/23/14 10:31 AM  Result Value Ref Range Status   Specimen Description URINE, CLEAN CATCH  Final   Special Requests augmentin Immunocompromised  Final   Colony Count   Final    65,000 COLONIES/ML Performed at [place] Performed at [facility]   Final   Report Status 06/24/2014 FINAL  Final    Studies/Results: Nm Pulmonary Perf And Vent  06/24/2014   CLINICAL DATA:  Chest pain and shortness of breath.  EXAM: NUCLEAR MEDICINE VENTILATION - PERFUSION LUNG SCAN  TECHNIQUE: Ventilation images were obtained in multiple projections using inhaled aerosol technetium 99 M DTPA. Perfusion images were obtained in multiple projections after intravenous injection of Tc-62m MAA.  RADIOPHARMACEUTICALS:  36.0 mCi Tc-56m DTPA aerosol and 5.0 mCi Tc-86m MAA  COMPARISON:  PA and lateral chest x-ray of today's date  FINDINGS: Ventilation: There is patchy deposition of the radiopharmaceutical in the central airways with evidence of patchy ventilation within both lungs.  Perfusion: No wedge shaped peripheral perfusion defects to suggest acute pulmonary embolism. There is a photopenic region consistent with  the patient's pacemaker generator.  IMPRESSION: The findings are consistent with a low probability for acute pulmonary embolism.   Electronically Signed   By: David  Martinique   On: 06/24/2014 15:00    Assessment/Plan:   10 days out from TURBT. Urine is clearing. He is in atrial arrhythmia. I'm fine with starting a anticoagulant at the present time. The patient will continue physical therapy.    I spoke with Dr. Wynonia Lawman about the patient.   LOS: 10 days   Franchot Gallo M 06/26/2014, 11:30 AM

## 2014-06-26 NOTE — Progress Notes (Signed)
[facility] PROGRESS NOTE  [name] RAQ:[id number] DOB: May 12, 1930 DOA: 06/16/2014 PCP: Gwendolyn Grant, MD  Summary I have seen and examined Mr Edward [name] at bedside in the presence of his daughter and reviewed his chart. Hospitalist service is following for medical co management including HCAP/AKI.  Appreciate Cardiology/GU. Edward [name] is a pleasant 81 year old male with a history of COPD, coronary artery disease, carotid artery disease, dyslipidemia, status post pacemaker placement, afib on Coumadin at admission and is now status post s/p TURBT. He is on treatment for Acute exacerbation of CHF(40-45%)- combined diastolic and systolic as well as HCAP. He also has anemia from acute blood loss from GU bleed. Coumadin on hold but INR>2. Will give dose of vitamin k, continue antibiotics and defer to cardiology for CHF management. He grew an insiginificant number of colonies of yeast in urine, but will err on the side of treatment of the yeast in view of recent manipulation.    Assessment/Plan: 1-Acute hypoxic Respiratory Failure in the setting of HCAP  -Will continue Augmentin (day  4/5-7) -continue flutter valve along with ICS -continue Mucinex, nebulizer treatments. -V-Q scan suggested low probability for PE. Venous doppler shows no dvt.    2-Acute Chronic combined systolic/diastolic HF; -no ACE/ARB due to renal failure with recent AKI -follow daily weight  we have switched him from IV to by mouth Lasix -Follow Cardiology recommendations.     hypokalemia  secondary to diuresis Replete  3-Acute Renal Failure/Candiduria;  -prerenal -Baseline creatinine around 1.6-1.9 at baseline -Cr is  1.55  Today , therefore improving -Avoid hypotension, continue holding nephrotoxic agents.  -NSL fluid due to concern for pulmonary edema.  -renal US showed "...a complex lesion in the urinary bladder consistent with recently biopsied bladder tumor"      4-Atrial fibrillation status  post Ablation, status post pacemaker patient to continue with amiodarone   reviewed cardiology recommendations about restarting anticoagulation  Review urology recommendations about okay to go ahead with it  He will need to be anticoagulated for 3-4 weeks before  cardiology can cardiovert this back to sinus rhythm.   5-ileus:improving clinically. Will continue diet as tolerated  6-Hyponatremia: improved/stable; will monitor.  Restrict IV fluids    7-Hematuria, TURP; post surgery and in setting of anticoagulation. Repeat hgb has remained relatively stable(8.9g/dl today) and there is no need for transfusion currently.      8-mild elevated troponin /CAD. In setting renal failure. Cardiology following  9-delirium: multifactorial: hospital acquired, PNA, use of narcotics and inability to sleep/rest with ongoing bladder spasm and pain.  minimize narcotics;    PT/OT evaluation  -recommend home health   10-left upper extremity:  -neg LUE duplex for DVT and/or SVT -continue arm elevation     Code Status: full code.  Family Communication: care discussed with daughter, who was at bedside.  Disposition Plan: remain inpatient.    Procedures - Left ventricle: The cavity size was normal. There was moderate concentric hypertrophy. Systolic function was mildly to moderately reduced. The estimated ejection fraction was in the range of 40% to 45%. Moderate hypokinesis of the apical myocardium. Mild hypokinesis of the inferior myocardium. Features are consistent with a pseudonormal left ventricular filling pattern, with concomitant abnormal relaxation and increased filling pressure (grade 2 diastolic dysfunction). - Aortic valve: Valve mobility was mildly restricted. There was mild to moderate stenosis. There was mild regurgitation. Valve area (VTI): 1.23 cm^2. Valve area (Vmax): 0.93 cm^2. Valve area (Vmean): 0.9 cm^2. - Mitral valve: Mildly to moderately  calcified  annulus. There was moderate regurgitation. - Left atrium: The atrium was mildly to moderately dilated. - Right atrium: The atrium was mildly dilated. - Pulmonary arteries: Systolic pressure was moderately increased. PA peak pressure: 55 mm Hg (S).  Antibiotics:  Vancomycin 1-20>>1/24  Zosyn 1-20>>1/24  Augmentin 1/24>>1/29 (plan is to complete 5 more days of tx)  Fluconazole 1/27>  HPI/Subjective:  generalized weakness, no nausea vomiting chest pain abdominal pain, atrial flutter on 1/22  Objective: Filed Vitals:   06/26/14 1311  BP: 112/54  Pulse: 70  Temp: 98.4 F (36.9 C)  Resp: 18    Intake/Output Summary (Last 24 hours) at 06/26/14 1426 Last data filed at 06/26/14 1221  Gross per 24 hour  Intake    400 ml  Output   4685 ml  Net  -4285 ml   Filed Weights   06/24/14 0549 06/25/14 0450 06/26/14 0523  Weight: 81.92 kg (180 lb 9.6 oz) 79.47 kg (175 lb 3.2 oz) 75.978 kg (167 lb 8 oz)    Exam:   General:  Sitting up in recliner chair, not in distress.            Lungs: Reduced breath sounds in bases             cardiac: Regular rhythm, normal S1 and S2, no S3, 2/6 systolic murmur  Abdomen: soft, non tender. +BS  Musculoskeletal: No pedal edema.  Data Reviewed: Basic Metabolic Panel:  Recent Labs Lab 06/21/14 0833 06/23/14 0450 06/24/14 0805 06/25/14 0201 06/26/14 0530  NA 133* 137 135 136 140  K 4.9 4.5 4.4 3.6 3.2*  CL 103 104 103 104 99  CO2 23 23 24 26  32  GLUCOSE 93 107* 96 110* 91  BUN 34* 47* 39* 36* 30*  CREATININE 1.92* 2.10* 1.79* 1.68* 1.55*  CALCIUM 8.3* 8.7 8.4 8.3* 8.5   Liver Function Tests: No results for input(s): AST, ALT, ALKPHOS, BILITOT, PROT, ALBUMIN in the last 168 hours. No results for input(s): LIPASE, AMYLASE in the last 168 hours. No results for input(s): AMMONIA in the last 168 hours. CBC:  Recent Labs Lab 06/21/14 0833 06/23/14 0450 06/24/14 1430 06/25/14 0201 06/26/14 0530  WBC 10.9* 13.5*  12.1* 9.2 9.5  HGB 9.4* 9.5* 9.8* 8.9* 9.7*  HCT 29.9* 30.0* 31.0* 27.4* 30.4*  MCV 94.6 94.6 94.2 92.9 93.3  PLT 243 297 317 289 296   Cardiac Enzymes:  Recent Labs Lab 06/24/14 1445 06/24/14 2031 06/25/14 0201  TROPONINI 0.03 0.04* 0.04*   BNP (last 3 results) No results for input(s): PROBNP in the last 8760 hours. CBG: No results for input(s): GLUCAP in the last 168 hours.  Recent Results (from the past 240 hour(s))  Culture, blood (routine x 2)     Status: None   Collection Time: 06/18/14  8:33 PM  Result Value Ref Range Status   Specimen Description BLOOD LEFT ARM  Final   Special Requests BOTTLES DRAWN AEROBIC AND ANAEROBIC 10CC  Final   Culture   Final    NO GROWTH 5 DAYS Performed at [facility]    Report Status 06/25/2014 FINAL  Final  Culture, blood (routine x 2)     Status: None   Collection Time: 06/18/14  8:38 PM  Result Value Ref Range Status   Specimen Description BLOOD LEFT ARM  Final   Special Requests BOTTLES DRAWN AEROBIC ONLY 5 CC  Final   Culture   Final    NO GROWTH 5 DAYS Performed at [facility]    Report Status 06/25/2014 FINAL  Final  Culture, Urine     Status: None   Collection Time: 06/23/14 10:31 AM  Result Value Ref Range Status   Specimen Description URINE, CLEAN CATCH  Final   Special Requests augmentin Immunocompromised  Final   Colony Count   Final    65,000 COLONIES/ML Performed at [facility] Performed at [facility]   Final   Report Status 06/24/2014 FINAL  Final     Studies: No results found.  Scheduled Meds: . acetaminophen  1,000 mg Oral 3 times per day  . amiodarone  200 mg Oral Daily  . amoxicillin-clavulanate  1 tablet Oral TID  . budesonide (PULMICORT) nebulizer solution  0.25 mg Nebulization BID  . docusate sodium  100 mg Oral BID  . furosemide  40 mg Oral Daily  . lactose free nutrition  237 mL Oral BID BM  . levalbuterol  1.25 mg Nebulization Q6H WA   . polyethylene glycol  17 g Oral QPC supper  . potassium chloride  40 mEq Oral BID  . rosuvastatin  20 mg Oral Daily  . senna  1 tablet Oral Daily  . tamsulosin  0.4 mg Oral QHS  . tiotropium  18 mcg Inhalation Daily  . warfarin  0.5 mg Oral ONCE-1800  . Warfarin - Pharmacist Dosing Inpatient   Does not apply q1800   Continuous Infusions:   Active Problems:   Cancer of bladder wall   Protein-calorie malnutrition, severe   Hypotension   PNA (pneumonia)   Acute respiratory failure   Candiduria    Time spent: 25 minutes.    [facility]  [facility] Pager [phone]. If 7PM-7AM, please contact night-coverage at [email or web address], password [facility] 06/26/2014, 2:26 PM  LOS: 10 days

## 2014-06-26 NOTE — Progress Notes (Signed)
ANTICOAGULATION CONSULT NOTE - Initial Consult  Pharmacy Consult for Warfarin Indication: atrial fibrillation  Allergies  Allergen Reactions  . Ace Inhibitors Cough  . Clarithromycin Other (See Comments)    Strange thoughts and fell with biaxin  . Codeine Nausea And Vomiting  . Oxycodone-Acetaminophen Other (See Comments)    Felt closed in    Patient Measurements: Height: 5\' 10"  (177.8 cm) Weight: 167 lb 8 oz (75.978 kg) IBW/kg (Calculated) : 73   Vital Signs: Temp: 98.4 F (36.9 C) (01/28 0943) Temp Source: Oral (01/28 0943) BP: 118/56 mmHg (01/28 0943) Pulse Rate: 85 (01/28 0943)  Labs:  Recent Labs  06/24/14 0805  06/24/14 1430 06/24/14 1445 06/24/14 2031 06/25/14 0201 06/26/14 0530  HGB  --   < > 9.8*  --   --  8.9* 9.7*  HCT  --   --  31.0*  --   --  27.4* 30.4*  PLT  --   --  317  --   --  289 296  LABPROT  --   --   --  23.9*  --   --  20.1*  INR  --   --   --  2.12*  --   --  1.70*  CREATININE 1.79*  --   --   --   --  1.68* 1.55*  TROPONINI  --   --   --  0.03 0.04* 0.04*  --   < > = values in this interval not displayed.  Estimated Creatinine Clearance: 37.9 mL/min (by C-G formula based on Cr of 1.55).   Medical History: Past Medical History  Diagnosis Date  . Gout     "only once in my lifetime" (12/18/2012)  . Depression   . PVD (peripheral vascular disease)     s/p B CEA  . COPD (chronic obstructive pulmonary disease)      PFT 6.19.07: FEV1 465  ratio 60 with 25% response to B2.  > PFTs 8.29.07: FEV1 61%  ratio 46% no better after B2.  > add on advair 12.20.11-improved 1.31.12  . AAA (abdominal aortic aneurysm)   . Melanosis coli   . Internal hemorrhoids   . Carotid artery occlusion   . Aortic valve disorder   . Chronic diastolic heart failure 01/03/5783  . CAD (coronary artery disease), native coronary artery     CABG w LIMA to LAD, SVG to dx, OM, RCA 1989 Dr. Arlyce Dice for 3VD PTCA of OM, 1999 and 2000 Cath showed occlusion of left main and  RCA with stenosis in OM Redo redo CABG w SVG to OM, SVG to RCA8/10/00 Dr. Cyndia Bent   . Hyperlipidemia   . BPH (benign prostatic hypertrophy)   . Hypertensive heart disease     Change toprol to bisoprolol 10 mg daily on trial basis  05/21/2012  - notified by [facility] non adherent 07/17/2012    . LBBB (left bundle branch block)   . Second degree heart block 12/18/2012    s/p MDT Adapta L pacemaker 12-20-2012 by Dr Caryl Comes  . Shingles years ago    mild  . Abrasion of left forearm dec 2015    small scraped area healing  . Chronic kidney disease stage III (GFR 30-59 ml/min)     saw dr Justin Mend oct 2015 and released by dr webb  . Congenital absence of kidney     "noted during AAA repair; never knew it before" (12/18/2012)  . Cancer     bladder  . Myocardial infarction 1979  Medications:  Scheduled:  . acetaminophen  1,000 mg Oral 3 times per day  . amiodarone  200 mg Oral Daily  . amoxicillin-clavulanate  1 tablet Oral TID  . budesonide (PULMICORT) nebulizer solution  0.25 mg Nebulization BID  . docusate sodium  100 mg Oral BID  . furosemide  40 mg Oral Daily  . lactose free nutrition  237 mL Oral BID BM  . levalbuterol  1.25 mg Nebulization Q6H WA  . polyethylene glycol  17 g Oral QPC supper  . potassium chloride  40 mEq Oral BID  . rosuvastatin  20 mg Oral Daily  . senna  1 tablet Oral Daily  . tamsulosin  0.4 mg Oral QHS  . tiotropium  18 mcg Inhalation Daily   Infusions:    Assessment: 60 yoM admitted on 1/18 s/p TURBT for hematuria.  Prior to admission he was taking warfarin (recently changed from Eliquis) for atrial fibrillation, but anticoagulation was held for hematuria.  Cardiology noted recurrent Atrial flutter from 1/22.  Urology agrees it is safe to resume anticoagulation at this time.  Pharmacy is consulted to resume warfarin dosing.   Home warfarin dose 1mg  PO daily.  Last dose 1/14  Admission INR was therapeutic at 2.12  Today, 06/26/2014:   INR 1.7  Note that INR  remained therapeutic (2.06 on 1/22 and 2.12 on 1/26) despite holding warfarin  Today is first day subtherapeutic, s/p Vitamin K 5mg  PO given 1/27  CBC:  Hgb 9.7, Plt 296  Urology reports urine is clear and no further bleeding at this time.  Diet: regular  Drug-drug interactions: Fluconazole (x1 on 1/27, may increase warfarin concentrations), Amiodarone (stable on this combination PTA), vitamin K (x1 on 1/27, may suppress INR for several days).   Goal of Therapy:  INR 2-3 Monitor platelets by anticoagulation protocol: Yes   Plan:   Warfarin 0.5mg  PO once, today at 1800  Daily INR, CBC  Monitor closely for s/s bleeding   Gretta Arab PharmD, BCPS Pager [phone] 06/26/2014 11:18 AM

## 2014-06-27 LAB — CBC
HEMATOCRIT: 31 % — AB (ref 39.0–52.0)
Hemoglobin: 9.9 g/dL — ABNORMAL LOW (ref 13.0–17.0)
MCH: 29.6 pg (ref 26.0–34.0)
MCHC: 31.9 g/dL (ref 30.0–36.0)
MCV: 92.8 fL (ref 78.0–100.0)
Platelets: 281 10*3/uL (ref 150–400)
RBC: 3.34 MIL/uL — AB (ref 4.22–5.81)
RDW: 14.2 % (ref 11.5–15.5)
WBC: 9.8 10*3/uL (ref 4.0–10.5)

## 2014-06-27 LAB — BASIC METABOLIC PANEL
Anion gap: 7 (ref 5–15)
BUN: 26 mg/dL — ABNORMAL HIGH (ref 6–23)
CALCIUM: 8.6 mg/dL (ref 8.4–10.5)
CO2: 34 mmol/L — ABNORMAL HIGH (ref 19–32)
Chloride: 99 mmol/L (ref 96–112)
Creatinine, Ser: 1.46 mg/dL — ABNORMAL HIGH (ref 0.50–1.35)
GFR calc Af Amer: 50 mL/min — ABNORMAL LOW (ref >90)
GFR calc non Af Amer: 43 mL/min — ABNORMAL LOW (ref >90)
Glucose, Bld: 110 mg/dL — ABNORMAL HIGH (ref 70–99)
POTASSIUM: 3.8 mmol/L (ref 3.5–5.1)
Sodium: 140 mmol/L (ref 135–145)

## 2014-06-27 LAB — PROTIME-INR
INR: 1.38 (ref 0.00–1.49)
Prothrombin Time: 17.1 seconds — ABNORMAL HIGH (ref 11.6–15.2)

## 2014-06-27 MED ORDER — LEVALBUTEROL HCL 1.25 MG/0.5ML IN NEBU
1.2500 mg | INHALATION_SOLUTION | Freq: Three times a day (TID) | RESPIRATORY_TRACT | Status: DC
  Administered 2014-06-28: 1.25 mg via RESPIRATORY_TRACT
  Filled 2014-06-27 (×4): qty 0.5

## 2014-06-27 MED ORDER — WARFARIN SODIUM 1 MG PO TABS
1.0000 mg | ORAL_TABLET | Freq: Once | ORAL | Status: AC
  Administered 2014-06-27: 1 mg via ORAL
  Filled 2014-06-27: qty 1

## 2014-06-27 MED ORDER — ENOXAPARIN SODIUM 80 MG/0.8ML ~~LOC~~ SOLN
75.0000 mg | Freq: Two times a day (BID) | SUBCUTANEOUS | Status: DC
  Administered 2014-06-27 – 2014-06-28 (×3): 75 mg via SUBCUTANEOUS
  Filled 2014-06-27 (×4): qty 0.8

## 2014-06-27 MED ORDER — FUROSEMIDE 20 MG PO TABS
20.0000 mg | ORAL_TABLET | Freq: Every day | ORAL | Status: DC
  Administered 2014-06-27 – 2014-06-28 (×2): 20 mg via ORAL
  Filled 2014-06-27 (×2): qty 1

## 2014-06-27 NOTE — Progress Notes (Signed)
Subjective:  Major issue.  His anorexia and weakness as well as physical deconditioning.  He has had continued weight loss and now is at 161 pounds.  Continues to diurese significantly.  Not much in the way of shortness of breath.  Renal function continues to improve.  Objective:  Vital Signs in the last 24 hours: BP 119/58 mmHg  Pulse 83  Temp(Src) 98.1 F (36.7 C) (Oral)  Resp 18  Ht 5\' 10"  (1.778 m)  Wt 73.437 kg (161 lb 14.4 oz)  BMI 23.23 kg/m2  SpO2 90%  Physical Exam: Pleasant WM in NAD Lungs:  Reduced breath sounds in bases Cardiac:  Regular rhythm, normal S1 and S2, no S3, 2/6 systolic murmur Extremities:no edema present  Intake/Output from previous day: 01/28 0701 - 01/29 0700 In: 240 [P.O.:240] Out: 3035 [Urine:3035] Weight Filed Weights   06/25/14 0450 06/26/14 0523 06/27/14 0449  Weight: 79.47 kg (175 lb 3.2 oz) 75.978 kg (167 lb 8 oz) 73.437 kg (161 lb 14.4 oz)    Lab Results: Basic Metabolic Panel:  Recent Labs  06/26/14 0530 06/27/14 0520  NA 140 140  K 3.2* 3.8  CL 99 99  CO2 32 34*  GLUCOSE 91 110*  BUN 30* 26*  CREATININE 1.55* 1.46*    CBC:  Recent Labs  06/26/14 0530 06/27/14 0520  WBC 9.5 9.8  HGB 9.7* 9.9*  HCT 30.4* 31.0*  MCV 93.3 92.8  PLT 296 281   Assessment/Plan:   1.  Acute on combined systolic and diastolic heart failure with weight loss since here and good diuresis appears to be back at baseline-may be over diuresed by weight 2.  Stage 3-4 chronic kidney disease-stable and renal function appears to be improving and leveling off 3.  Coronary artery disease with previous bypass graft and previous infarct. 4.  Recent pneumonia appears to be improving. 5.  Recurrent atrial arrhythmias with recurrent atrial flutter since the 22nd 6.  Physical deconditioning  Recommendations:  He will need discharge planning for either SNF or rehabilitation.  I would cut his furosemide back to 20 mg daily in light of his significant  diuresis and weight loss.  Warfarin was resumed yesterday.  After he has been therapeutically anticoagulated for 3-4 weeks consider cardioversion.     Kerry Hough  MD [facility]  06/27/2014, 8:06 AM

## 2014-06-27 NOTE — Progress Notes (Signed)
Physical Therapy Treatment Patient Details Name: Edward [name] MRN: [id number] DOB: 09/23/1929 Today's Date: 14-Jul-2014    History of Present Illness 81 yo male s/p TURBT 06/16/14. Hx of COPD, CAD, pacemaker, A fib, AAA, HTN, PVD, BPH    PT Comments    Pt ambulated in hallway and required supplemental oxygen, also fatigues quickly.  SATURATION QUALIFICATIONS: (This note is used to comply with regulatory documentation for home oxygen)  Patient Saturations on Room Air at Rest = 99%  Patient Saturations on Room Air while Ambulating = 81%  Patient Saturations on 2 Liters of oxygen while Ambulating = 93%  Please briefly explain why patient needs home oxygen: to maintain oxygen saturations above 88% during functional activities such as ambulation   Follow Up Recommendations  Supervision/Assistance - 24 hour;Home health PT (SNF if assist at home not available)     Equipment Recommendations  Rolling walker with 5" wheels    Recommendations for Other Services       Precautions / Restrictions Precautions Precautions: Fall Precaution Comments: monitor O2 Restrictions Weight Bearing Restrictions: No    Mobility  Bed Mobility               General bed mobility comments: pt up in recliner on arrival  Transfers Overall transfer level: Needs assistance Equipment used: Rolling walker (2 wheeled) Transfers: Sit to/from Stand Sit to Stand: Min guard         General transfer comment: verbal cues for safe technique  Ambulation/Gait Ambulation/Gait assistance: Min guard;Supervision Ambulation Distance (Feet): 120 Feet Assistive device: Rolling walker (2 wheeled) Gait Pattern/deviations: Step-through pattern;Trunk flexed;Decreased stride length     General Gait Details: SpO2 dropped to 81% on room air applied 2L O2 [name], dyspnea 2/4, fatigues quickly.    Stairs            Wheelchair Mobility    Modified Rankin (Stroke Patients Only)       Balance                                     Cognition Arousal/Alertness: Awake/alert Behavior During Therapy: WFL for tasks assessed/performed Overall Cognitive Status: Within Functional Limits for tasks assessed                      Exercises      General Comments        Pertinent Vitals/Pain Pain Assessment: No/denies pain    Home Living                      Prior Function            PT Goals (current goals can now be found in the care plan section) Progress towards PT goals: Progressing toward goals    Frequency  Min 3X/week    PT Plan Current plan remains appropriate    Co-evaluation             End of Session Equipment Utilized During Treatment: Oxygen Activity Tolerance: Patient limited by fatigue Patient left: in chair;with call bell/phone within reach     Time: 1110-1123 PT Time Calculation (min) (ACUTE ONLY): 13 min  Charges:  $Gait Training: 8-22 mins                    G Codes:      Edward [name],Edward [name] 07/14/2014, 11:34 AM Edward [name], PT, DPT 07/14/14  Pager: [phone]

## 2014-06-27 NOTE — Progress Notes (Signed)
[place] for Warfarin, Lovenox Indication: atrial fibrillation  Allergies  Allergen Reactions  . Ace Inhibitors Cough  . Clarithromycin Other (See Comments)    Strange thoughts and fell with biaxin  . Codeine Nausea And Vomiting  . Oxycodone-Acetaminophen Other (See Comments)    Felt closed in    Patient Measurements: Height: 5\' 10"  (177.8 cm) Weight: 161 lb 14.4 oz (73.437 kg) IBW/kg (Calculated) : 73   Vital Signs: Temp: 98.1 F (36.7 C) (01/29 0445) Temp Source: Oral (01/29 0445) BP: 119/58 mmHg (01/29 0445) Pulse Rate: 83 (01/29 0445)  Labs:  Recent Labs  06/24/14 1445 06/24/14 2031 06/25/14 0201 06/26/14 0530 06/27/14 0520  HGB  --   --  8.9* 9.7* 9.9*  HCT  --   --  27.4* 30.4* 31.0*  PLT  --   --  289 296 281  LABPROT 23.9*  --   --  20.1* 17.1*  INR 2.12*  --   --  1.70* 1.38  CREATININE  --   --  1.68* 1.55* 1.46*  TROPONINI 0.03 0.04* 0.04*  --   --     Estimated Creatinine Clearance: 40.3 mL/min (by C-G formula based on Cr of 1.46).  Medications:  Scheduled:  . acetaminophen  1,000 mg Oral 3 times per day  . amiodarone  200 mg Oral Daily  . amoxicillin-clavulanate  1 tablet Oral TID  . budesonide (PULMICORT) nebulizer solution  0.25 mg Nebulization BID  . docusate sodium  100 mg Oral BID  . furosemide  20 mg Oral Daily  . lactose free nutrition  237 mL Oral BID BM  . levalbuterol  1.25 mg Nebulization Q6H WA  . polyethylene glycol  17 g Oral QPC supper  . rosuvastatin  20 mg Oral Daily  . senna  1 tablet Oral Daily  . tamsulosin  0.4 mg Oral QHS  . tiotropium  18 mcg Inhalation Daily  . Warfarin - Pharmacist Dosing Inpatient   Does not apply q1800    Assessment: 87 yoM admitted on 1/18 s/p TURBT for hematuria.  Prior to admission he was taking warfarin (recently changed from Eliquis) for atrial fibrillation, but anticoagulation was held for hematuria.  Cardiology noted recurrent Atrial flutter from  1/22.  Urology agrees it is safe to resume anticoagulation at this time.  Pharmacy is consulted to resume warfarin dosing and bridge with Lovenox.   Home warfarin dose 1mg  PO daily.  Last dose 1/14  Admission INR was therapeutic at 2.12  Today, 06/27/2014:   INR 1.38  INR is subtherapeutic and decreased s/p Vitamin K 5mg  PO given 1/27  Note that INR remained therapeutic (2.06 on 1/22 and 2.12 on 1/26) despite holding warfarin since 1/14  CBC:  Hgb 9.9, low but slightly improved.  Plt remain WNL.  Urology reports urine is clear and no further bleeding at this time.  SCr 1.46 (improving) with CrCl ~ 40 ml/min.  Diet: regular  Drug-drug interactions: Fluconazole (x1 on 1/27, may increase warfarin concentrations), Amiodarone (stable on this combination PTA), vitamin K (x1 on 1/27, may suppress INR for several days).   Goal of Therapy:  INR 2-3 Monitor platelets by anticoagulation protocol: Yes   Plan:   Warfarin 1 mg PO once, today at 1800  Lovenox 75 mg SQ q12h  Daily INR, CBC  Monitor closely for s/s bleeding  For discharge, recommend resuming home warfarin 1mg  PO daily with Lovenox bridge, 75mg  SQ q12h.  Recheck INR at least by  Monday, 2/1.   Gretta Arab PharmD, BCPS Pager [phone] 06/27/2014 9:32 AM

## 2014-06-27 NOTE — Progress Notes (Signed)
Clinical Social Work Department BRIEF PSYCHOSOCIAL ASSESSMENT 06/27/2014  Patient:  Edward [name], Edward [name]     Account Number:  [id number]     Admit date:  06/16/2014  Clinical Social Worker:  Renold Genta  Date/Time:  06/27/2014 12:08 PM  Referred by:  Physician  Date Referred:  06/27/2014 Referred for  SNF Placement   Other Referral:   Interview type:  Patient Other interview type:   and daughter, Edward [name] at bedside    PSYCHOSOCIAL DATA Living Status:  ALONE Admitted from facility:   Level of care:   Primary support name:  Tanya Nones (daughter) ph#: [phone] Primary support relationship to patient:  CHILD, ADULT Degree of support available:   good    CURRENT CONCERNS Current Concerns  Post-Acute Placement   Other Concerns:    SOCIAL WORK ASSESSMENT / PLAN CSW received consult that patient will need SNF at discharge.   Assessment/plan status:  Information/Referral to [employer] Other assessment/ plan:   Information/referral to community resources:   CSW completed FL2 and faxed information to [facility] - provided bed offers.    PATIENT'S/FAMILY'S RESPONSE TO PLAN OF CARE: CSW provided SNF list and bed offers to patient & daughter at bedside - patient states  that his wife was at [facility] about 5 years ago but is requesting a private room. CSW confirmed that [name] has a private room and would be able to take him over the weekend if ready. Patient & daughter are happy with decision for [name] as she lives in [place] & son lives in [place] so it would be halfway for both of them.       Edward [name], [facility] Clinical Social Worker cell #: [phone]

## 2014-06-27 NOTE — Progress Notes (Signed)
O2 Sats @ rest on 2L [place] - 100%                @ rest OFF O2 - 95%                 OFF O2 ambulating 89%                 OFF O2 post ambulation 87%

## 2014-06-27 NOTE — Progress Notes (Addendum)
[facility] PROGRESS NOTE  Edward [name] WEX:[id number] DOB: 26-Feb-1930 DOA: 06/16/2014 PCP: Gwendolyn Grant, MD  Summary I have seen and examined Edward [name] at bedside in the presence of his daughter and reviewed his chart. Hospitalist service is following for medical co management including HCAP/AKI.  Appreciate Cardiology/GU. Edward [name] is a pleasant 81 year old male with a history of COPD, coronary artery disease, carotid artery disease, dyslipidemia, status post pacemaker placement, afib on Coumadin at admission and is now status post s/p TURBT. He is on treatment for Acute exacerbation of CHF(40-45%)- combined diastolic and systolic as well as HCAP. He also has anemia from acute blood loss from GU bleed. Coumadin on hold but INR>2. Will give dose of vitamin k, continue antibiotics and defer to cardiology for CHF management. He grew an insiginificant number of colonies of yeast in urine, but will err on the side of treatment of the yeast in view of recent manipulation.    Assessment/Plan: 1-Acute hypoxic Respiratory Failure in the setting of HCAP  -Patient to complete 5 days of Augmentin today -continue flutter valve along with ICS -continue Mucinex, nebulizer treatments. -V-Q scan suggested low probability for PE. Venous doppler shows no dvt.    2-Acute Chronic combined systolic/diastolic HF; -no ACE/ARB due to renal failure with recent AKI -follow daily weight  we have switched him from IV to by mouth Lasix Decrease Lasix to 20 mg daily as the patient has lost a significant amount of weight with diuresis this admission     hypokalemia  secondary to diuresis Recheck BMP every 5 days   3-Acute Renal Failure/Candiduria;  -prerenal -Baseline creatinine around 1.6-1.9 at baseline Recheck BMP on a weekly basis -Avoid hypotension, continue holding nephrotoxic agents.  -renal US showed "...a complex lesion in the urinary bladder consistent with recently biopsied bladder  tumor"      4-Atrial fibrillation status post Ablation, status post pacemaker patient to continue with amiodarone   reviewed cardiology recommendations about restarting anticoagulation  Review urology recommendations about okay to go ahead with it  He will need to be anticoagulated for 3-4 weeks before  cardiology can cardiovert this back to sinus rhythm.  For discharge, recommend resuming home warfarin 1mg  PO daily with Lovenox bridge, 75mg  SQ q12h. Recheck INR at least by Monday, 2/1. Discontinue Lovenox if any evidence of recurrent hematuria, Discontinue Lovenox once  INR reaches  2-3   5-ileus:improving clinically. Tolerating regular diet  6-Hyponatremia: improved/stable; will monitor.  Restrict IV fluids    7-Hematuria, TURP; post surgery and in setting of anticoagulation. Repeat hgb has remained relatively stable(8.9g/dl today) and there is no need for transfusion currently.      8-mild elevated troponin /CAD. In setting renal failure. Cardiology following  9-delirium: multifactorial: hospital acquired, PNA, use of narcotics and inability to sleep/rest with ongoing bladder spasm and pain.  minimize narcotics;    PT/OT evaluation  -recommend home health   10-left upper extremity:  -neg LUE duplex for DVT and/or SVT -continue arm elevation     Code Status: full code.  Family Communication: care discussed with the patient, no family by the bedside Disposition Plan: Patient is discharged from a medical standpoint   Procedures - Left ventricle: The cavity size was normal. There was moderate concentric hypertrophy. Systolic function was mildly to moderately reduced. The estimated ejection fraction was in the range of 40% to 45%. Moderate hypokinesis of the apical myocardium. Mild hypokinesis of the inferior myocardium. Features are consistent with a pseudonormal left ventricular filling  pattern, with concomitant abnormal relaxation and  increased filling pressure (grade 2 diastolic dysfunction). - Aortic valve: Valve mobility was mildly restricted. There was mild to moderate stenosis. There was mild regurgitation. Valve area (VTI): 1.23 cm^2. Valve area (Vmax): 0.93 cm^2. Valve area (Vmean): 0.9 cm^2. - Mitral valve: Mildly to moderately calcified annulus. There was moderate regurgitation. - Left atrium: The atrium was mildly to moderately dilated. - Right atrium: The atrium was mildly dilated. - Pulmonary arteries: Systolic pressure was moderately increased. PA peak pressure: 55 mm Hg (S).  Antibiotics:  Vancomycin 1-20>>1/24  Zosyn 1-20>>1/24  Augmentin 1/24>>1/29 (plan is to complete 5 more days of tx)  Fluconazole 1/27>  HPI/Subjective:  generalized weakness, no nausea vomiting chest pain abdominal pain, patient requesting discharge  Objective: Filed Vitals:   06/27/14 0940  BP: 121/61  Pulse: 72  Temp:   Resp: 18    Intake/Output Summary (Last 24 hours) at 06/27/14 1314 Last data filed at 06/27/14 0445  Gross per 24 hour  Intake      0 ml  Output   1100 ml  Net  -1100 ml   Filed Weights   06/25/14 0450 06/26/14 0523 06/27/14 0449  Weight: 79.47 kg (175 lb 3.2 oz) 75.978 kg (167 lb 8 oz) 73.437 kg (161 lb 14.4 oz)    Exam:   General:  Sitting up in recliner chair, not in distress.            Lungs: Reduced breath sounds in bases             cardiac: Regular rhythm, normal S1 and S2, no S3, 2/6 systolic murmur  Abdomen: soft, non tender. +BS  Musculoskeletal: No pedal edema.  Data Reviewed: Basic Metabolic Panel:  Recent Labs Lab 06/23/14 0450 06/24/14 0805 06/25/14 0201 06/26/14 0530 06/27/14 0520  NA 137 135 136 140 140  K 4.5 4.4 3.6 3.2* 3.8  CL 104 103 104 99 99  CO2 23 24 26  32 34*  GLUCOSE 107* 96 110* 91 110*  BUN 47* 39* 36* 30* 26*  CREATININE 2.10* 1.79* 1.68* 1.55* 1.46*  CALCIUM 8.7 8.4 8.3* 8.5 8.6   Liver Function Tests: No results for  input(s): AST, ALT, ALKPHOS, BILITOT, PROT, ALBUMIN in the last 168 hours. No results for input(s): LIPASE, AMYLASE in the last 168 hours. No results for input(s): AMMONIA in the last 168 hours. CBC:  Recent Labs Lab 06/23/14 0450 06/24/14 1430 06/25/14 0201 06/26/14 0530 06/27/14 0520  WBC 13.5* 12.1* 9.2 9.5 9.8  HGB 9.5* 9.8* 8.9* 9.7* 9.9*  HCT 30.0* 31.0* 27.4* 30.4* 31.0*  MCV 94.6 94.2 92.9 93.3 92.8  PLT 297 317 289 296 281   Cardiac Enzymes:  Recent Labs Lab 06/24/14 1445 06/24/14 2031 06/25/14 0201  TROPONINI 0.03 0.04* 0.04*   BNP (last 3 results) No results for input(s): PROBNP in the last 8760 hours. CBG: No results for input(s): GLUCAP in the last 168 hours.  Recent Results (from the past 240 hour(s))  Culture, blood (routine x 2)     Status: None   Collection Time: 06/18/14  8:33 PM  Result Value Ref Range Status   Specimen Description BLOOD LEFT ARM  Final   Special Requests BOTTLES DRAWN AEROBIC AND ANAEROBIC 10CC  Final   Culture   Final    NO GROWTH 5 DAYS Performed at [facility]    Report Status 06/25/2014 FINAL  Final  Culture, blood (routine x 2)     Status: None  Collection Time: 06/18/14  8:38 PM  Result Value Ref Range Status   Specimen Description BLOOD LEFT ARM  Final   Special Requests BOTTLES DRAWN AEROBIC ONLY 5 CC  Final   Culture   Final    NO GROWTH 5 DAYS Performed at [facility]    Report Status 06/25/2014 FINAL  Final  Culture, Urine     Status: None   Collection Time: 06/23/14 10:31 AM  Result Value Ref Range Status   Specimen Description URINE, CLEAN CATCH  Final   Special Requests augmentin Immunocompromised  Final   Colony Count   Final    65,000 COLONIES/ML Performed at [place] Performed at [facility]   Final   Report Status 06/24/2014 FINAL  Final     Studies: No results found.  Scheduled Meds: . acetaminophen  1,000 mg Oral 3 times per day  .  amiodarone  200 mg Oral Daily  . amoxicillin-clavulanate  1 tablet Oral TID  . budesonide (PULMICORT) nebulizer solution  0.25 mg Nebulization BID  . docusate sodium  100 mg Oral BID  . enoxaparin (LOVENOX) injection  75 mg Subcutaneous Q12H  . furosemide  20 mg Oral Daily  . lactose free nutrition  237 mL Oral BID BM  . levalbuterol  1.25 mg Nebulization Q6H WA  . polyethylene glycol  17 g Oral QPC supper  . rosuvastatin  20 mg Oral Daily  . senna  1 tablet Oral Daily  . tamsulosin  0.4 mg Oral QHS  . tiotropium  18 mcg Inhalation Daily  . Warfarin - Pharmacist Dosing Inpatient   Does not apply q1800   Continuous Infusions:   Active Problems:   Cancer of bladder wall   Protein-calorie malnutrition, severe   Hypotension   PNA (pneumonia)   Acute respiratory failure   Candiduria    Time spent: 25 minutes.    [facility]  [facility] Pager [phone]. If 7PM-7AM, please contact night-coverage at [email or web address], password [facility] 06/27/2014, 1:14 PM  LOS: 11 days

## 2014-06-27 NOTE — Progress Notes (Signed)
11 Days Post-Op Subjective: Patient reports that he had a hard time sleeping last night because the noise that that made. He has had no problems with urination. He has not passed any clots. Oral intake is not great according to his son but he is drinking his supplements  Objective: Vital signs in last 24 hours: Temp:  [98.1 F (36.7 C)-98.4 F (36.9 C)] 98.1 F (36.7 C) (01/29 0445) Pulse Rate:  [70-85] 83 (01/29 0445) Resp:  [18-19] 18 (01/29 0445) BP: (111-121)/(54-65) 119/58 mmHg (01/29 0445) SpO2:  [90 %-100 %] 90 % (01/29 0741) Weight:  [73.437 kg (161 lb 14.4 oz)] 73.437 kg (161 lb 14.4 oz) (01/29 0449)  Intake/Output from previous day: 01/28 0701 - 01/29 0700 In: 240 [P.O.:240] Out: 3035 [Urine:3035] Intake/Output this shift:    Physical Exam:  Constitutional: Vital signs reviewed. WD WN in NAD   Eyes: PERRL, No scleral icterus.   Pulmonary/Chest: Normal effort    Lab Results:  Recent Labs  06/25/14 0201 06/26/14 0530 06/27/14 0520  HGB 8.9* 9.7* 9.9*  HCT 27.4* 30.4* 31.0*   BMET  Recent Labs  06/26/14 0530 06/27/14 0520  NA 140 140  K 3.2* 3.8  CL 99 99  CO2 32 34*  GLUCOSE 91 110*  BUN 30* 26*  CREATININE 1.55* 1.46*  CALCIUM 8.5 8.6    Recent Labs  06/24/14 1445 06/26/14 0530 06/27/14 0520  INR 2.12* 1.70* 1.38   No results for input(s): LABURIN in the last 72 hours. Results for orders placed or performed during the hospital encounter of 06/16/14  Culture, blood (routine x 2)     Status: None   Collection Time: 06/18/14  8:33 PM  Result Value Ref Range Status   Specimen Description BLOOD LEFT ARM  Final   Special Requests BOTTLES DRAWN AEROBIC AND ANAEROBIC 10CC  Final   Culture   Final    NO GROWTH 5 DAYS Performed at [facility]    Report Status 06/25/2014 FINAL  Final  Culture, blood (routine x 2)     Status: None   Collection Time: 06/18/14  8:38 PM  Result Value Ref Range Status   Specimen Description BLOOD LEFT  ARM  Final   Special Requests BOTTLES DRAWN AEROBIC ONLY 5 CC  Final   Culture   Final    NO GROWTH 5 DAYS Performed at [facility]    Report Status 06/25/2014 FINAL  Final  Culture, Urine     Status: None   Collection Time: 06/23/14 10:31 AM  Result Value Ref Range Status   Specimen Description URINE, CLEAN CATCH  Final   Special Requests augmentin Immunocompromised  Final   Colony Count   Final    65,000 COLONIES/ML Performed at [place] Performed at [facility]   Final   Report Status 06/24/2014 FINAL  Final   Urinalysis light pink Studies/Results: No results found.  Assessment/Plan:   Postoperative day #11 from TURBT, large bladder tumor with postoperative bleed/clot retention. He has had probable pneumonia as well as CHF secondary to atrial fibrillation. Currently, he is improving. Mentally, he is quite alert. Physically, he still needs physical therapy to assist with strengthening of his lower extremities. He has now on Coumadin for his atrial flutter.    From a urologic standpoint, I think he is fine to leave. His family would like to consider skilled nursing facility, and would prefer the current facility on [address] I'm fine  with him being discharged at any point according to the medical service and Dr. Wynonia Lawman.    He will eventually need BCG for his T1 high-grade urothelial carcinoma. We will arrange this in an outpatient setting. This has been discussed with the patient and his son.    I will have discharge planning take with the family regarding skilled nursing facility versus home management.   LOS: 11 days   Franchot Gallo M 06/27/2014, 7:55 AM

## 2014-06-27 NOTE — Progress Notes (Signed)
OT Cancellation Note  Patient Details Name: Edward [name] MRN: [id number] DOB: April 19, 1930   Cancelled Treatment:    Reason Eval/Treat Not Completed: Fatigue/lethargy limiting ability to participate.  Will check back another day.  Binnie Droessler 06/27/2014, 4:02 PM  Lesle Chris, OTR/L [phone] 06/27/2014

## 2014-06-27 NOTE — Progress Notes (Signed)
Patient has a bed at [facility] & they would be able to take patient over the weekend.   If ready, please contact weekend CSW, Rollene Fare (ph#: [phone]) to facilitate discharge.     Clinical Social Work Department CLINICAL SOCIAL WORK PLACEMENT NOTE 06/27/2014  Patient:  Edward [name], Edward [name]  Account Number:  [id number] Admit date:  06/16/2014  Clinical Social Worker:  Renold Genta  Date/time:  06/27/2014 12:12 PM  Clinical Social Work is seeking post-discharge placement for this patient at the following level of care:   [place]   (*CSW will update this form in Epic as items are completed)   06/27/2014  Patient/family provided with [place] Department of Clinical Social Work's list of facilities offering this level of care within the geographic area requested by the patient (or if unable, by the patient's family).  06/27/2014  Patient/family informed of their freedom to choose among providers that offer the needed level of care, that participate in Medicare, Medicaid or managed care program needed by the patient, have an available bed and are willing to accept the patient.  06/27/2014  Patient/family informed of [facility]' ownership interest in [facility], as well as of the fact that they are under no obligation to receive care at this facility.  PASARR submitted to EDS on 06/27/2014 PASARR number received on 06/27/2014  FL2 transmitted to all facilities in geographic area requested by pt/family on  06/27/2014 FL2 transmitted to all facilities within larger geographic area on   Patient informed that his/her managed care company has contracts with or will negotiate with  certain facilities, including the following:     Patient/family informed of bed offers received:  06/27/2014 Patient chooses bed at [place] Physician recommends and patient chooses bed at    Patient to be transferred to [place] on   Patient to be transferred to facility by   Patient and family notified of transfer on  Name of family member notified:    The following physician request were entered in Epic:   Additional Comments:    [name], [place] Social Worker cell #: [phone]

## 2014-06-27 NOTE — Progress Notes (Signed)
CARE MANAGEMENT NOTE 06/27/2014  Patient:  Edward [name], Edward [name]   Account Number:  [id number]  Date Initiated:  06/23/2014  Documentation initiated by:  Karl Bales  Subjective/Objective Assessment:   pt admitted for TURP, resp distress, PNA     Action/Plan:   from home   Anticipated DC Date:  06/28/2014   Anticipated DC Plan:  [place]  In-house referral  Clinical Social Worker      DC Planning Services  CM consult      Choice offered to / List presented to:             Status of service:  Completed, signed off Medicare Important Message given?  YES (If response is "NO", the following Medicare IM given date fields will be blank) Date Medicare IM given:  06/27/2014 Medicare IM given by:  Karl Bales Date Additional Medicare IM given:   Additional Medicare IM given by:    Discharge Disposition:  [place]  Per UR Regulation:  Reviewed for med. necessity/level of care/duration of stay  If discussed at [place] of Stay Meetings, dates discussed:    Comments:  06/27/14 MMcGibboney, RN, BSN Pt discharging to SNF.  CSW following/[facility].  06/23/14 MMcGibboney, RN, BSN Chart reviewed.

## 2014-06-28 LAB — PROTIME-INR
INR: 1.38 (ref 0.00–1.49)
Prothrombin Time: 17.1 seconds — ABNORMAL HIGH (ref 11.6–15.2)

## 2014-06-28 LAB — CBC
HEMATOCRIT: 29.9 % — AB (ref 39.0–52.0)
HEMOGLOBIN: 9.4 g/dL — AB (ref 13.0–17.0)
MCH: 29.3 pg (ref 26.0–34.0)
MCHC: 31.4 g/dL (ref 30.0–36.0)
MCV: 93.1 fL (ref 78.0–100.0)
Platelets: 270 10*3/uL (ref 150–400)
RBC: 3.21 MIL/uL — ABNORMAL LOW (ref 4.22–5.81)
RDW: 14.1 % (ref 11.5–15.5)
WBC: 9.9 10*3/uL (ref 4.0–10.5)

## 2014-06-28 MED ORDER — LEVALBUTEROL HCL 0.63 MG/3ML IN NEBU: 0.6300 mg | INHALATION_SOLUTION | Freq: Three times a day (TID) | RESPIRATORY_TRACT | Status: DC | PRN

## 2014-06-28 MED ORDER — BUDESONIDE 0.25 MG/2ML IN SUSP: 0.2500 mg | Freq: Two times a day (BID) | RESPIRATORY_TRACT | Status: DC

## 2014-06-28 MED ORDER — ENOXAPARIN SODIUM 80 MG/0.8ML ~~LOC~~ SOLN: 75.0000 mg | Freq: Two times a day (BID) | SUBCUTANEOUS | Status: DC

## 2014-06-28 MED ORDER — AMOXICILLIN-POT CLAVULANATE 500-125 MG PO TABS: 1.0000 | ORAL_TABLET | Freq: Three times a day (TID) | ORAL | Status: DC

## 2014-06-28 MED ORDER — WARFARIN SODIUM 1 MG PO TABS: 1.0000 mg | ORAL_TABLET | Freq: Every day | ORAL | Status: DC

## 2014-06-28 MED ORDER — WARFARIN SODIUM 1 MG PO TABS
1.0000 mg | ORAL_TABLET | Freq: Once | ORAL | Status: DC
  Filled 2014-06-28: qty 1

## 2014-06-28 MED ORDER — HYDROCODONE-ACETAMINOPHEN 5-325 MG PO TABS: 1.0000 | ORAL_TABLET | Freq: Four times a day (QID) | ORAL | Status: DC | PRN

## 2014-06-28 MED ORDER — FUROSEMIDE 20 MG PO TABS: 20.0000 mg | ORAL_TABLET | Freq: Every day | ORAL | Status: DC

## 2014-06-28 NOTE — Progress Notes (Signed)
Edward [name] for Warfarin, Lovenox Indication: atrial fibrillation  Allergies  Allergen Reactions  . Ace Inhibitors Cough  . Clarithromycin Other (See Comments)    Strange thoughts and fell with biaxin  . Codeine Nausea And Vomiting  . Oxycodone-Acetaminophen Other (See Comments)    Felt closed in    Patient Measurements: Height: 5\' 10"  (177.8 cm) Weight: 167 lb 9.6 oz (76.023 kg) IBW/kg (Calculated) : 73   Vital Signs: Temp: 98.4 F (36.9 C) (01/30 0449) Temp Source: Oral (01/30 0449) BP: 95/51 mmHg (01/30 0721) Pulse Rate: 87 (01/30 0721)  Labs:  Recent Labs  06/26/14 0530 06/27/14 0520 06/28/14 0516  HGB 9.7* 9.9* 9.4*  HCT 30.4* 31.0* 29.9*  PLT 296 281 270  LABPROT 20.1* 17.1* 17.1*  INR 1.70* 1.38 1.38  CREATININE 1.55* 1.46*  --     Estimated Creatinine Clearance: 40.3 mL/min (by C-G formula based on Cr of 1.46).  Medications:  Scheduled:  . acetaminophen  1,000 mg Oral 3 times per day  . amiodarone  200 mg Oral Daily  . amoxicillin-clavulanate  1 tablet Oral TID  . budesonide (PULMICORT) nebulizer solution  0.25 mg Nebulization BID  . docusate sodium  100 mg Oral BID  . enoxaparin (LOVENOX) injection  75 mg Subcutaneous Q12H  . furosemide  20 mg Oral Daily  . lactose free nutrition  237 mL Oral BID BM  . levalbuterol  1.25 mg Nebulization TID  . polyethylene glycol  17 g Oral QPC supper  . rosuvastatin  20 mg Oral Daily  . senna  1 tablet Oral Daily  . tamsulosin  0.4 mg Oral QHS  . tiotropium  18 mcg Inhalation Daily  . Warfarin - Pharmacist Dosing Inpatient   Does not apply q1800    Assessment: 63 yoM admitted on 1/18 s/p TURBT for hematuria.  Prior to admission he was taking warfarin (recently changed from Eliquis) for atrial fibrillation, but anticoagulation was held for hematuria.  Cardiology noted recurrent Atrial flutter from 1/22.  Urology agrees it is safe to resume anticoagulation at this time.   Pharmacy is consulted to resume warfarin dosing and bridge with Lovenox.   Home warfarin dose 1mg  PO daily.  Last dose 1/14  Admission INR was therapeutic at 2.12  Today, 06/28/2014:   INR 1.38  INR is subtherapeutic and decreased s/p Vitamin K 5mg  PO given 1/27  Note that INR remained therapeutic (2.06 on 1/22 and 2.12 on 1/26) despite holding warfarin since 1/14  CBC:  Hgb 9.4, low .  Plt remain WNL.  Urology reports urine is clear and no further bleeding at this time.  SCr 1.46 (improving) with CrCl ~ 40 ml/min.  Diet: regular  Drug-drug interactions: Fluconazole (x1 on 1/27, may increase warfarin concentrations), Amiodarone (stable on this combination PTA), vitamin K (x1 on 1/27, may suppress INR for several days).   Goal of Therapy:  INR 2-3 Monitor platelets by anticoagulation protocol: Yes   Plan:   Warfarin 1 mg PO once, today at 1800  Lovenox 75 mg SQ q12h  Daily INR, CBC  Monitor closely for s/s bleeding  For discharge, recommend resuming home warfarin 1mg  PO daily with Lovenox bridge, 75mg  SQ q12h.  Recheck INR at least by Monday, 2/1.   Dolly Rias RPh 06/28/2014, 9:20 AM Pager [phone]

## 2014-06-28 NOTE — Progress Notes (Signed)
Condom catheter fx well Urine a bit bloody Can go home/rehab when OK with medical team See Dr Diona Fanti as outpt in next few weeks

## 2014-06-28 NOTE — Clinical Social Work Note (Signed)
CSW received a call from RN that pt was ready for discharge today  CSW called [name], uploaded discharge summary, prepared discharge packet and provided to RN  CSW called and let pt's family know that pt would be discharging today  CSW called for transport  No further CSW needs  CSW signing off  .Dede Query, LCSW [facility] Clinical Social Worker - Weekend Coverage cell #: [phone]

## 2014-06-28 NOTE — Progress Notes (Signed)
Patient complained of shortness of breath, O2 sats checked 95% on 3L [place]. (see flowsheet)

## 2014-06-28 NOTE — Clinical Social Work Placement (Signed)
Clinical Social Work Department CLINICAL SOCIAL WORK PLACEMENT NOTE 06/28/2014  Patient:  Edward [name], Edward [name]  Account Number:  [id number] Admit date:  06/16/2014  Clinical Social Worker:  Renold Genta  Date/time:  06/27/2014 12:12 PM  Clinical Social Work is seeking post-discharge placement for this patient at the following level of care:   [place]   (*CSW will update this form in Epic as items are completed)   06/27/2014  Patient/family provided with [place] Department of Clinical Social Work's list of facilities offering this level of care within the geographic area requested by the patient (or if unable, by the patient's family).  06/27/2014  Patient/family informed of their freedom to choose among providers that offer the needed level of care, that participate in Medicare, Medicaid or managed care program needed by the patient, have an available bed and are willing to accept the patient.  06/27/2014  Patient/family informed of [facility]' ownership interest in [facility], as well as of the fact that they are under no obligation to receive care at this facility.  PASARR submitted to EDS on 06/27/2014 PASARR number received on 06/27/2014  FL2 transmitted to all facilities in geographic area requested by pt/family on  06/27/2014 FL2 transmitted to all facilities within larger geographic area on   Patient informed that his/her managed care company has contracts with or will negotiate with  certain facilities, including the following:     Patient/family informed of bed offers received:  06/27/2014 Patient chooses bed at [place] Physician recommends and patient chooses bed at    Patient to be transferred to [place] on  06/28/2014 Patient to be transferred to facility by ambulance Patient and family notified of transfer on 06/28/2014 Name of family member notified:  Gaspar Bidding  The following physician request were entered in Epic:   Additional  Comments:  Dede Query, [place] Social Worker - Weekend Coverage cell #: [phone]

## 2014-06-28 NOTE — Progress Notes (Signed)
Subjective:  Says he feels fairly good today.  His weight is up today but is not having much in the way of shortness of breath.  Continues to have breathing treatments.  Objective:  Vital Signs in the last 24 hours: BP 95/51 mmHg  Pulse 87  Temp(Src) 98.4 F (36.9 C) (Oral)  Resp 22  Ht 5\' 10"  (1.778 m)  Wt 76.023 kg (167 lb 9.6 oz)  BMI 24.05 kg/m2  SpO2 98%  Physical Exam: Pleasant WM in NAD Lungs:  Reduced breath sounds in bases Cardiac:  Regular rhythm, normal S1 and S2, no S3, 2/6 systolic murmur Extremities:no edema present  Intake/Output from previous day: 01/29 0701 - 01/30 0700 In: -  Out: 1250 [Urine:1250] Weight Filed Weights   06/26/14 0523 06/27/14 0449 06/28/14 0449  Weight: 75.978 kg (167 lb 8 oz) 73.437 kg (161 lb 14.4 oz) 76.023 kg (167 lb 9.6 oz)    Lab Results: Basic Metabolic Panel:  Recent Labs  06/26/14 0530 06/27/14 0520  NA 140 140  K 3.2* 3.8  CL 99 99  CO2 32 34*  GLUCOSE 91 110*  BUN 30* 26*  CREATININE 1.55* 1.46*    CBC:  Recent Labs  06/27/14 0520 06/28/14 0516  WBC 9.8 9.9  HGB 9.9* 9.4*  HCT 31.0* 29.9*  MCV 92.8 93.1  PLT 281 270   Assessment/Plan:   1.  Acute on combined systolic and diastolic heart failure with weight loss since here and good diuresis appears to be back at baseline-appears to be baseline in terms of his weight-the plan currently will be to resume anticoagulation after he gets over his surgery and has been anticoagulated therapeutically for at least 4 weeks to consider cardioversion. 2.  Stage 3-4 chronic kidney disease-stable and renal function appears to be improving and leveling off 3.  Coronary artery disease with previous bypass graft and previous infarct. 4.  Recent pneumonia appears to be improving. 5.  Recurrent atrial arrhythmias with recurrent atrial flutter since the 22nd 6.  Physical deconditioning  Recommendations:  Okay to go to skilled nursing from a cardiac viewpoint.  He will need  to be continued on his warfarin and become therapeutic.  He will need to have his weights followed closely and for now, would discharge on furosemide 20 mg daily but may need to increase if his weight goes up or he develops fluid retention.   Kerry Hough  MD [facility]  06/28/2014, 8:24 AM

## 2014-06-28 NOTE — Discharge Summary (Signed)
Physician Discharge Summary  Edward [name] TKW:[id number] DOB: August 23, 1929 DOA: 06/16/2014  PCP: Gwendolyn Grant, MD  Admit date: 06/16/2014 Discharge date: 06/28/2014  Recommendations for Outpatient Follow-up:  1. Pt will need to follow up with PCP in 1-2 weeks post discharge 2. Please obtain BMP to evaluate electrolytes and kidney function, potassium level  3. Please also check CBC to evaluate Hg and Hct level 4. Will resume anticoagulation upon discharge and since INR on discharge 1.38, pt will need to continue taking Lovenox 75 mg inj BID and have PT/INR checked 06/30/2014. Pt also to continue taking Coumadin 1 mg PO QD and have dose readjusted based on INR 5. After pt has been anticoagulated therapeutically for at least 4 weeks, can consider cardioversion 6. Need to have appointment with cardiologist scheduled 7. Pt will need to have his weights followed closely, discharge on furosemide 20 mg daily but may need to increase if his weight goes up or he develops fluid retention. Weight on discharge is 167 lbs.  8. Continue Augmentin for 5 more days post discharge for HCAP   Discharge Diagnoses:  Active Problems:   Cancer of bladder wall   Protein-calorie malnutrition, severe   Hypotension   PNA (pneumonia)   Acute respiratory failure   Candiduria  Discharge Condition: Stable  Diet recommendation: Heart healthy diet discussed in details   History of present illness:  81 year old male with a history of COPD, coronary artery disease, carotid artery disease, dyslipidemia, status post pacemaker placement, afib on Coumadin at admission and is now status post s/p TURBT. He is on treatment for Acute exacerbation of CHF(40-45%)- combined diastolic and systolic as well as HCAP. He also has anemia from acute blood loss from GU bleed. Coumadin was on hold due to bleed and INR>2.   Assessment/Plan: 1-Acute hypoxic Respiratory Failure in the setting of HCAP  - pt is clinically stable this AM,  would like to go home  - continue Mucinex, nebulizer treatment as needed, Augmentin for 5 moe days - V-Q scan suggested low probability for PE. Venous doppler shows no dvt.  - pt will need to continue anticoagulation with Comadin (bridging with Lovenox due to sub therapeutic INR)  2-Acute Chronic combined systolic/diastolic HF; - no ACE/ARB due to renal failure with recent AKI -f ollow daily weights, weight on discharge 167 lbs  - d/c on Lasix 20 mg daily, please note that dose may need to be changed based on weight stability    3- Hypokalemia -secondary to diuresis - recheck BMP upon follow up and supplement as indicated    3-Acute Renal Failure - prerenal - Baseline creatinine around 1.6-1.9 at baseline - Avoid hypotension, continue holding nephrotoxic agents.  - renal US showed "...a complex lesion in the urinary bladder consistent with recently biopsied bladder tumor" - Cr trending down   4-Atrial fibrillation status post Ablation, status post pacemaker -patient to continue with amiodarone  - reviewed cardiology recommendations about restarting anticoagulation  - He will need to be anticoagulated for 3-4 weeks beforecardiology can cardiovert this back to sinus rhythm.  - For d/c, recommend resuming home warfarin 1mg  daily with Lovenox bridge, 75mg  SQ q12h. Recheck INR at least by Monday, 2/1. - Discontinue Lovenox if any evidence of recurrent hematuria, - Discontinue Lovenox once INR reaches 2-3  5-ileus: - improving clinically. Tolerating regular diet  6-Hyponatremia:  - improved/stable  7-Hematuria, TURP;  - post surgery and in setting of anticoagulation. Repeat hgb has remained relatively stable and there is no need  for transfusion currently.   8-Mild elevated troponin /CAD. In setting renal failure.   9-Delirium:  - multifactorial: hospital acquired, PNA, use of narcotics and inability to sleep/rest with ongoing bladder spasm and pain. -minimize  narcotics;  - PT/OT evaluation - recommend home health  10-Left upper extremity pain and swelling  - neg LUE duplex for DVT and/or SVT - continue arm elevation if needed   Procedures/Studies: Dg Chest 2 View  06/24/2014   CLINICAL DATA:  Shortness of breath.  EXAM: CHEST  2 VIEW  COMPARISON:  June 18, 2014.  FINDINGS: Stable cardiomediastinal silhouette. Status post coronary artery bypass graft. Left-sided pacemaker is unchanged in position. No pneumothorax is noted. Mild bilateral pleural effusions are noted. Mild central pulmonary vascular congestion is noted with increased bibasilar interstitial densities concerning for pulmonary edema. Stable right upper lobe opacity is noted concerning for edema or pneumonia.  IMPRESSION: Mildly increased basilar interstitial densities are noted concerning for pulmonary edema with associated pleural effusions. Stable right upper lobe opacity is noted concerning for edema or possibly pneumonia.   Electronically Signed   By: Sabino Dick M.D.   On: 06/24/2014 12:57   Dg Chest 2 View  06/18/2014   CLINICAL DATA:  Shortness of Breath  EXAM: CHEST  2 VIEW  COMPARISON:  Study obtained earlier in the day  FINDINGS: There is now focal airspace consolidation in the right upper lobe. There is underlying emphysema with slightly less interstitial edema. Heart is prominent with pulmonary vascularity within normal limits. Pacemaker leads are attached to the right heart. No adenopathy.  IMPRESSION: New patchy infiltrate right upper lobe. Less interstitial edema compared to earlier in the day. Underlying emphysema.   Electronically Signed   By: Lowella Grip M.D.   On: 06/18/2014 18:56   Dg Chest 2 View  06/06/2014   CLINICAL DATA:  Preop TURB, cough, congestion, shortness of breath  EXAM: CHEST  2 VIEW  COMPARISON:  11/18/2013  FINDINGS: Chronic interstitial markings. No focal consolidation. No pleural effusion or pneumothorax.  Mild nodularity overlying the  bilateral lower lungs likely reflects nipple shadows.  The heart is normal in size. Postsurgical changes related to prior CABG. Left subclavian pacemaker.  Mild degenerative changes of the visualized thoracolumbar spine. Right shoulder arthroplasty.  IMPRESSION: No evidence of acute cardiopulmonary disease.   Electronically Signed   By: Julian Hy M.D.   On: 06/06/2014 16:33   Dg Abd 1 View  06/18/2014   CLINICAL DATA:  Nausea and vomiting today. Some abdominal distension also.  EXAM: ABDOMEN - 1 VIEW  COMPARISON:  CT, 06/11/2014  FINDINGS: Mildly distended loops of small bowel are noted in the left central abdomen. And mild gas distention of the right and transverse colon. Air is seen in a normal caliber descending colon and within the rectum.  There are surgical vascular clips in the central abdomen. Clips from a prior cholecystectomy are also noted.  Aortoiliac vascular calcifications as well as common femoral artery vascular calcifications also noted. Soft tissues otherwise unremarkable.  IMPRESSION: 1. Mild distention of loops of small bowel and portions of the colon. Findings suggest an adynamic ileus. Partial small bowel obstruction is possible but felt less likely. No other evidence of an acute abnormality.   Electronically Signed   By: Lajean Manes M.D.   On: 06/18/2014 18:56   Ct Abdomen Pelvis W Contrast  06/11/2014   CLINICAL DATA:  Initial encounter for left lower quadrant abdominal pain and bloating since January 7th.  EXAM: CT ABDOMEN AND PELVIS WITH CONTRAST  TECHNIQUE: Multidetector CT imaging of the abdomen and pelvis was performed using the standard protocol following bolus administration of intravenous contrast.  CONTRAST:  124mL OMNIPAQUE IOHEXOL 300 MG/ML  SOLN  COMPARISON:  04/08/2014  FINDINGS: Lower chest: Unremarkable aside from some peripheral chronic interstitial changes at the bases.  Hepatobiliary: Calcified granuloma seen in the subcapsular posterior right liver. No other  focal intraparenchymal abnormality. Gallbladder is surgically absent. No intrahepatic or extrahepatic biliary dilation.  Pancreas: Diffuse pancreatic atrophy without ductal dilatation. No focal mass lesion.  Spleen: No splenomegaly. No focal mass lesion.  Adrenals/Urinary Tract: No adrenal nodule or mass. Left kidney is atrophic with multiple left-sided renal cysts, as before. Cortical atrophy also noted in the right kidney. Small right renal cysts are evident. No enhancing lesion evident within either kidney. No hydroureter. There are amorphous areas of increased attenuation within the inferior bladder which may in part be related to inflow of some partially opacified urine although renal function is clearly impaired in the left kidney. There is an area in the posterior bladder suggestive of a focal urothelial lesion (see image 66 series 2).  Stomach/Bowel: Stomach is nondistended. No gastric wall thickening. No evidence of outlet obstruction. Duodenum is normally positioned as is the ligament of Treitz. No small bowel wall thickening. No small bowel dilatation. Terminal ileum is normal. Appendix is normal. No gross colonic mass. No colonic wall thickening. No substantial diverticular change.  Vascular/Lymphatic: Atherosclerotic calcification is noted in the wall of the abdominal aorta without aneurysm. No gastrohepatic or hepatoduodenal ligament lymphadenopathy. No retroperitoneal lymphadenopathy. No pelvic sidewall lymphadenopathy.  Reproductive: Prostate gland is unremarkable.  Other: No intraperitoneal free fluid.  Musculoskeletal: Compression fractures L3 and L1 are new in the interval.  IMPRESSION: Inferior and posterior bladder wall irregularity with some areas having features suggestive of focal urothelial lesion. Cystoscopy may prove helpful to further evaluate.  Bilateral renal atrophy, left greater than right, with associated renal cyst bilaterally.  Age indeterminate compression fractures at L1 and L3  are new since 04/08/2014.  Atherosclerosis.   Electronically Signed   By: Misty Stanley M.D.   On: 06/11/2014 14:27   US Renal  06/23/2014   CLINICAL DATA:  Renal failure. Recent transurethral resection of bladder tumor  EXAM: RENAL/URINARY TRACT ULTRASOUND COMPLETE  COMPARISON:  CT abdomen and pelvis June 11, 2014  FINDINGS: Right Kidney:  Length: 12.5 cm. Echogenicity within normal limits. There is renal cortical thinning. There is no pelvicaliectasis or perinephric fluid. There is no demonstrable mass. There is no sonographically demonstrable calculus or ureterectasis.  Left Kidney:  Length: 10.7 cm. Echogenicity within normal limits. There is renal cortical thinning. There are several cysts in the mid to lower pole left kidney. The largest cyst measures 3.0 x 2.8 x 2.9 cm and is toward the lower pole. In the mid portion, there is a 2.8 x 2.8 x 2.4 cm cyst as well as a nearby 2.1 x 1.9 x 1.7 cm cyst. There is no pelvicaliectasis or perinephric fluid. No sonographically demonstrable calculus or ureterectasis.  Bladder:  There is a complex mass in the inferior aspect of the urinary bladder which contains moderate fluid consistent with recently biopsied mass. The wall of the urinary bladder is not appreciably thickened. This mass currently measures 3.5 x 3.1 cm.  IMPRESSION: Both kidneys show renal cortical thinning consistent with medical renal disease. No obstructing foci in either kidney. There are 3 cysts in the left kidney peripherally.  There is a complex lesion in the urinary bladder consistent with recently biopsied bladder tumor.   Electronically Signed   By: Lowella Grip M.D.   On: 06/23/2014 14:26   Nm Pulmonary Perf And Vent  06/24/2014   CLINICAL DATA:  Chest pain and shortness of breath.  EXAM: NUCLEAR MEDICINE VENTILATION - PERFUSION LUNG SCAN  TECHNIQUE: Ventilation images were obtained in multiple projections using inhaled aerosol technetium 99 M DTPA. Perfusion images were obtained in  multiple projections after intravenous injection of Tc-73m MAA.  RADIOPHARMACEUTICALS:  36.0 mCi Tc-69m DTPA aerosol and 5.0 mCi Tc-60m MAA  COMPARISON:  PA and lateral chest x-ray of today's date  FINDINGS: Ventilation: There is patchy deposition of the radiopharmaceutical in the central airways with evidence of patchy ventilation within both lungs.  Perfusion: No wedge shaped peripheral perfusion defects to suggest acute pulmonary embolism. There is a photopenic region consistent with the patient's pacemaker generator.  IMPRESSION: The findings are consistent with a low probability for acute pulmonary embolism.   Electronically Signed   By: David  Martinique   On: 06/24/2014 15:00   Dg Chest Port 1 View  06/18/2014   CLINICAL DATA:  Hypotension and hypoxia  EXAM: PORTABLE CHEST - 1 VIEW  COMPARISON:  June 06, 2014  FINDINGS: There is underlying emphysematous change. There is generalized interstitial prominence consistent with edema. There is patchy airspace consolidation in the left base medially. Heart is upper normal in size with pulmonary vascularity within normal limits. No adenopathy. Pacemaker leads are attached to the right atrium and right ventricle. Patient is status post coronary artery bypass grafting. There is a total shoulder replacement on the right. There are surgical clips in the neck bilaterally.  IMPRESSION: Findings consistent with a degree of congestive heart failure superimposed on emphysematous change. Patchy airspace consolidation medial left base, likely representing pneumonia.   Electronically Signed   By: Lowella Grip M.D.   On: 06/18/2014 17:05   Dg Hip Unilat With Pelvis 2-3 Views Right  06/03/2014   CLINICAL DATA:  81 year old male with a history of right hip and low back pain for 1 month. No known injury.  EXAM: DG HIP W/ PELVIS 2-3V*R*  COMPARISON:  CT of the abdomen 04/08/2014  FINDINGS: The anterior view of the pelvis is under penetrated somewhat limiting evaluation.  Diffuse osteopenia is appreciated.  Bony pelvic ring appears intact without displaced fracture identified.  Bilateral hips projects normally over the acetabula.  Unremarkable appearance of the proximal right femur with no fracture line identified.  Degenerative changes of the visualized lower lumbar spine.  Extensive vascular calcifications.  IMPRESSION: No acute displaced fracture identified on the study. Note that the presence of osteopenia and the under penetration of the x-ray examination somewhat limits the sensitivity for the detection of nondisplaced fractures. If there is ongoing concern for hip fracture, MRI may be considered as a more sensitive test.  Extensive atherosclerosis.  Signed,  Dulcy Fanny. Earleen Newport, DO  [facility]  [facility]   Electronically Signed   By: Corrie Mckusick D.O.   On: 06/03/2014 17:54    Discharge Exam: Filed Vitals:   06/28/14 0721  BP: 95/51  Pulse: 87  Temp:   Resp: 22   Filed Vitals:   06/28/14 0719 06/28/14 0721 06/28/14 0745 06/28/14 0755  BP: 96/48 95/51    Pulse: 75 87    Temp:      TempSrc:      Resp: 20 22  Height:      Weight:      SpO2: 96% 95% 98% 98%    General: Pt is alert, follows commands appropriately, not in acute distress Cardiovascular: Regular rate and rhythm, no rubs, no gallops Respiratory: Clear to auscultation bilaterally, no wheezing, no crackles, no rhonchi Abdominal: Soft, non tender, non distended, bowel sounds +, no guarding Extremities: no cyanosis, pulses palpable bilaterally DP and PT  Discharge Instructions  Discharge Instructions    Diet - low sodium heart healthy    Complete by:  As directed      Increase activity slowly    Complete by:  As directed             Medication List    TAKE these medications        acetaminophen 325 MG tablet  Commonly known as:  TYLENOL  Take 2 tablets (650 mg total) by mouth every 6 (six) hours as needed for fever.      amiodarone 200 MG tablet  Commonly known as:  PACERONE  Take 200 mg by mouth daily.     amoxicillin-clavulanate 500-125 MG per tablet  Commonly known as:  AUGMENTIN  Take 1 tablet (500 mg total) by mouth 3 (three) times daily.     budesonide 0.25 MG/2ML nebulizer solution  Commonly known as:  PULMICORT  Take 2 mLs (0.25 mg total) by nebulization 2 (two) times daily.     colchicine 0.6 MG tablet  Take 0.6 mg by mouth daily as needed (gout flare ups).     enoxaparin 80 MG/0.8ML injection  Commonly known as:  LOVENOX  Inject 0.75 mLs (75 mg total) into the skin every 12 (twelve) hours. Continue taking Lovenox and Coumadin and make sure taht PT/INR is checked 06/30/2014 so that you can be instructed on the dosing of Coumadin and duration of Lovenox treatment     furosemide 20 MG tablet  Commonly known as:  LASIX  Take 1 tablet (20 mg total) by mouth daily.     guaiFENesin 600 MG 12 hr tablet  Commonly known as:  MUCINEX  Take by mouth as needed.     HYDROcodone-acetaminophen 5-325 MG per tablet  Commonly known as:  NORCO  Take 1-2 tablets by mouth every 6 (six) hours as needed for moderate pain.     levalbuterol 0.63 MG/3ML nebulizer solution  Commonly known as:  XOPENEX  Take 3 mLs (0.63 mg total) by nebulization every 8 (eight) hours as needed for wheezing or shortness of breath.     MIRALAX PO  Take 1 scoop by mouth daily after supper.     mometasone-formoterol 200-5 MCG/ACT Aero  Commonly known as:  DULERA  Inhale 2 puffs into the lungs every 12 (twelve) hours.     nitroGLYCERIN 0.4 MG SL tablet  Commonly known as:  NITROSTAT  Place 0.4 mg under the tongue every 5 (five) minutes as needed for chest pain.     oxybutynin 5 MG tablet  Commonly known as:  DITROPAN  Take 5 mg by mouth every 8 (eight) hours as needed for bladder spasms (bladder spasms).     rosuvastatin 20 MG tablet  Commonly known as:  CRESTOR  Take 20 mg by mouth daily.     tamsulosin 0.4 MG Caps  capsule  Commonly known as:  FLOMAX  Take 1 capsule (0.4 mg total) by mouth at bedtime.     temazepam 15 MG capsule  Commonly known as:  RESTORIL  Take 1 capsule (15  mg total) by mouth at bedtime.     tiotropium 18 MCG inhalation capsule  Commonly known as:  SPIRIVA  Place 1 capsule (18 mcg total) into inhaler and inhale daily.     warfarin 1 MG tablet  Commonly known as:  COUMADIN  Take 1 tablet (1 mg total) by mouth daily. Continue taking Coumadin and have PT/INR checked 06/30/2014 to have the dose adjusted appropriately           Follow-up Information    Follow up with Jorja Loa, MD.   Specialty:  Urology   Why:  As scheduled   Contact information:   [place] [address] [phone]       Follow up with Gwendolyn Grant, MD.   Specialty:  Internal Medicine   Contact information:   [address] [place] [address] [phone]        The results of significant diagnostics from this hospitalization (including imaging, microbiology, ancillary and laboratory) are listed below for reference.     Microbiology: Recent Results (from the past 240 hour(s))  Culture, blood (routine x 2)     Status: None   Collection Time: 06/18/14  8:33 PM  Result Value Ref Range Status   Specimen Description BLOOD LEFT ARM  Final   Special Requests BOTTLES DRAWN AEROBIC AND ANAEROBIC 10CC  Final   Culture   Final    NO GROWTH 5 DAYS Performed at [facility]    Report Status 06/25/2014 FINAL  Final  Culture, blood (routine x 2)     Status: None   Collection Time: 06/18/14  8:38 PM  Result Value Ref Range Status   Specimen Description BLOOD LEFT ARM  Final   Special Requests BOTTLES DRAWN AEROBIC ONLY 5 CC  Final   Culture   Final    NO GROWTH 5 DAYS Performed at [facility]    Report Status 06/25/2014 FINAL  Final  Culture, Urine     Status: None   Collection Time: 06/23/14 10:31 AM  Result Value Ref  Range Status   Specimen Description URINE, CLEAN CATCH  Final   Special Requests augmentin Immunocompromised  Final   Colony Count   Final    65,000 COLONIES/ML Performed at [place] Performed at [facility]   Final   Report Status 06/24/2014 FINAL  Final     Labs: Basic Metabolic Panel:  Recent Labs Lab 06/23/14 0450 06/24/14 0805 06/25/14 0201 06/26/14 0530 06/27/14 0520  NA 137 135 136 140 140  K 4.5 4.4 3.6 3.2* 3.8  CL 104 103 104 99 99  CO2 23 24 26  32 34*  GLUCOSE 107* 96 110* 91 110*  BUN 47* 39* 36* 30* 26*  CREATININE 2.10* 1.79* 1.68* 1.55* 1.46*  CALCIUM 8.7 8.4 8.3* 8.5 8.6   CBC:  Recent Labs Lab 06/24/14 1430 06/25/14 0201 06/26/14 0530 06/27/14 0520 06/28/14 0516  WBC 12.1* 9.2 9.5 9.8 9.9  HGB 9.8* 8.9* 9.7* 9.9* 9.4*  HCT 31.0* 27.4* 30.4* 31.0* 29.9*  MCV 94.2 92.9 93.3 92.8 93.1  PLT 317 289 296 281 270   Cardiac Enzymes:  Recent Labs Lab 06/24/14 1445 06/24/14 2031 06/25/14 0201  TROPONINI 0.03 0.04* 0.04*     SIGNED: Time coordinating discharge: Over 30 minutes  Faye Ramsay, MD  [facility] 06/28/2014, 10:09 AM Pager [phone]  If 7PM-7AM, please contact night-coverage [email or web address] Password  [email or web address]

## 2014-06-30 ENCOUNTER — Telehealth: Payer: Self-pay | Admitting: *Deleted

## 2014-06-30 ENCOUNTER — Non-Acute Institutional Stay (SKILLED_NURSING_FACILITY): Payer: Medicare Other | Admitting: Registered Nurse

## 2014-06-30 ENCOUNTER — Encounter: Payer: Self-pay | Admitting: *Deleted

## 2014-06-30 ENCOUNTER — Encounter: Payer: Self-pay | Admitting: Registered Nurse

## 2014-06-30 DIAGNOSIS — C679 Malignant neoplasm of bladder, unspecified: Secondary | ICD-10-CM

## 2014-06-30 DIAGNOSIS — E785 Hyperlipidemia, unspecified: Secondary | ICD-10-CM

## 2014-06-30 DIAGNOSIS — R5381 Other malaise: Secondary | ICD-10-CM

## 2014-06-30 DIAGNOSIS — K5900 Constipation, unspecified: Secondary | ICD-10-CM

## 2014-06-30 DIAGNOSIS — J449 Chronic obstructive pulmonary disease, unspecified: Secondary | ICD-10-CM

## 2014-06-30 DIAGNOSIS — I5043 Acute on chronic combined systolic (congestive) and diastolic (congestive) heart failure: Secondary | ICD-10-CM

## 2014-06-30 DIAGNOSIS — D62 Acute posthemorrhagic anemia: Secondary | ICD-10-CM

## 2014-06-30 DIAGNOSIS — G4700 Insomnia, unspecified: Secondary | ICD-10-CM

## 2014-06-30 DIAGNOSIS — J189 Pneumonia, unspecified organism: Secondary | ICD-10-CM

## 2014-06-30 DIAGNOSIS — J9601 Acute respiratory failure with hypoxia: Secondary | ICD-10-CM

## 2014-06-30 DIAGNOSIS — I480 Paroxysmal atrial fibrillation: Secondary | ICD-10-CM

## 2014-06-30 LAB — POCT INR: INR: 1.3 — AB (ref 0.9–1.1)

## 2014-06-30 NOTE — Progress Notes (Signed)
Patient ID: Edward [name], male   DOB: 1929/10/09, 81 y.o.   MRN: [id number]   Place of Service: [facility]  Allergies  Allergen Reactions  . Ace Inhibitors Cough  . Clarithromycin Other (See Comments)    Strange thoughts and fell with biaxin  . Codeine Nausea And Vomiting  . Oxycodone-Acetaminophen Other (See Comments)    Felt closed in    Code Status: Full Code  Goals of Care: Longevity/STR  Chief Complaint  Patient presents with  . Hospitalization Follow-up    HPI 81 y.o. male with PMH of COPD, CAD s/p CABG x5, HLD, paroxysmal afib on coumadin, s/p pacemaker on coumadin, CHF, bladder wall cancer s/p TURBT among many others is being seen for a follow-up visit post hospital admission from 06/16/14 to 06/28/14 for acute exacerbation of CHF with EF 40-45%, HCAP, and acute blood loss anemia from GU bleed. He is here fore short term rehabilitation and his goal is to return home. Seen in room today. Reported having constipation. Last BM 3 days ago. Does not want enema. Also reported exertional dyspnea and trouble sleeping at night due to noise from oxygen machine. Would like to know if he needs to wear oxygen as he was not oxygen therapy prior to hospitalization.   Review of Systems Constitutional: Negative for fever, chills, and fatigue. HENT: Negative for ear pain, congestion, and sore throat Eyes: Negative for eye pain, eye discharge, and visual disturbance  Cardiovascular: Negative for chest pain and palpitations. Positive for leg swelling Respiratory: Negative cough and wheezing. Positive for exertional dyspnea.  Gastrointestinal: Negative for nausea and vomiting. Negative for abdominal pain and diarrhea. Positive for constipation Genitourinary: Negative for dysuria. Positive for hematuria Musculoskeletal: Negative for back pain, joint pain, and joint swelling  Neurological: Negative for dizziness and headache. Positive for weakness Skin: Negative for rash and wound.     Psychiatric: Negative for depression   Past Medical History  Diagnosis Date  . Gout     "only once in my lifetime" (12/18/2012)  . Depression   . PVD (peripheral vascular disease)     s/p B CEA  . COPD (chronic obstructive pulmonary disease)      PFT 6.19.07: FEV1 465  ratio 60 with 25% response to B2.  > PFTs 8.29.07: FEV1 61%  ratio 46% no better after B2.  > add on advair 12.20.11-improved 1.31.12  . AAA (abdominal aortic aneurysm)   . Melanosis coli   . Internal hemorrhoids   . Carotid artery occlusion   . Aortic valve disorder   . Chronic diastolic heart failure 29/09/6211  . CAD (coronary artery disease), native coronary artery     CABG w LIMA to LAD, SVG to dx, OM, RCA 1989 Dr. Arlyce Dice for 3VD PTCA of OM, 1999 and 2000 Cath showed occlusion of left main and RCA with stenosis in OM Redo redo CABG w SVG to OM, SVG to RCA8/10/00 Dr. Cyndia Bent   . Hyperlipidemia   . BPH (benign prostatic hypertrophy)   . Hypertensive heart disease     Change toprol to bisoprolol 10 mg daily on trial basis  05/21/2012  - notified by [facility] non adherent 07/17/2012    . LBBB (left bundle branch block)   . Second degree heart block 12/18/2012    s/p MDT Adapta L pacemaker 12-20-2012 by Dr Caryl Comes  . Shingles years ago    mild  . Abrasion of left forearm dec 2015    small scraped area healing  .  Chronic kidney disease stage III (GFR 30-59 ml/min)     saw dr Justin Mend oct 2015 and released by dr webb  . Congenital absence of kidney     "noted during AAA repair; never knew it before" (12/18/2012)  . Cancer     bladder  . Myocardial infarction 1979    Past Surgical History  Procedure Laterality Date  . Cholecystectomy  1998  . Hemiarthroplasty shoulder fracture Right ~ 2008     x 2 - [facility]  . Carotid endarterectomy Bilateral 1990's  . Abdominal aortic aneurysm repair  1998  . Carotid-subclavian bypass graft Left 1990's  . Anal fissure repair      12/18/2012 "I don't remember this"  . Hemorroidectomy       12/18/2012 "I don't remember this"  . Carpal tunnel release Right 1980's    "Dr. Marrian Salvage" (12/18/2012)  . Cardiac catheterization      "2 or 3" (12/18/2012)  . Coronary angioplasty with stent placement      "I've got 1 or 2" (12/18/2012)  . Hip fracture surgery  1944    "fell out of a tree; had it operated on 3 times" (12/18/2012)  . Refractive surgery Bilateral 1990's?  . Pacemaker insertion  12-20-2012    dual chamber Medtronic Adapta L pacemaker implanted by Dr Caryl Comes  . Cardioversion N/A 12/30/2013    Procedure: CARDIOVERSION;  Surgeon: Jacolyn Reedy, MD;  Location: [facility];  Service: Cardiovascular;  Laterality: N/A;  . Permanent pacemaker insertion N/A 12/20/2012    Procedure: PERMANENT PACEMAKER INSERTION;  Surgeon: Deboraha Sprang, MD;  Location: [facility] CATH LAB;  Service: Cardiovascular;  Laterality: N/A;  . Coronary artery bypass graft  1989, 2000     "CABG X ?3; CABG X 5 w//Bartle" (12/18/2012)  . Transurethral resection of bladder tumor N/A 06/16/2014    Procedure: TRANSURETHRAL RESECTION OF BLADDER TUMOR (TURBT) ;  Surgeon: Jorja Loa, MD;  Location: [facility];  Service: Urology;  Laterality: N/A;    History  Substance Use Topics  . Smoking status: Former Smoker -- 1.50 packs/day for 50 years    Types: Cigarettes    Quit date: 05/30/1996  . Smokeless tobacco: Never Used  . Alcohol Use: 1.8 oz/week    3 Cans of beer per week     Comment: beer few times per week    Family History  Problem Relation Age of Onset  . Heart disease Mother   . Colon cancer Neg Hx       Medication List       This list is accurate as of: 06/30/14 11:59 PM.  Always use your most recent med list.               acetaminophen 325 MG tablet  Commonly known as:  TYLENOL  Take 2 tablets (650 mg total) by mouth every 6 (six) hours as needed for fever.     amiodarone 200 MG tablet  Commonly known as:  PACERONE  Take 200 mg by mouth daily.     amoxicillin-clavulanate 500-125 MG  per tablet  Commonly known as:  AUGMENTIN  Take 1 tablet (500 mg total) by mouth 3 (three) times daily.     bisacodyl 10 MG suppository  Commonly known as:  DULCOLAX  Place 10 mg rectally daily as needed for moderate constipation or severe constipation.     budesonide 0.25 MG/2ML nebulizer solution  Commonly known as:  PULMICORT  Take 2 mLs (0.25 mg total) by nebulization  2 (two) times daily.     colchicine 0.6 MG tablet  Take 0.6 mg by mouth daily as needed (gout flare ups).     enoxaparin 80 MG/0.8ML injection  Commonly known as:  LOVENOX  Inject 0.75 mLs (75 mg total) into the skin every 12 (twelve) hours. Continue taking Lovenox and Coumadin and make sure taht PT/INR is checked 06/30/2014 so that you can be instructed on the dosing of Coumadin and duration of Lovenox treatment     furosemide 20 MG tablet  Commonly known as:  LASIX  Take 1 tablet (20 mg total) by mouth daily.     guaiFENesin 600 MG 12 hr tablet  Commonly known as:  MUCINEX  Take by mouth as needed.     HYDROcodone-acetaminophen 5-325 MG per tablet  Commonly known as:  NORCO  Take 1-2 tablets by mouth every 6 (six) hours as needed for moderate pain.     levalbuterol 0.63 MG/3ML nebulizer solution  Commonly known as:  XOPENEX  Take 3 mLs (0.63 mg total) by nebulization every 8 (eight) hours as needed for wheezing or shortness of breath.     MIRALAX PO  Take 1 scoop by mouth daily after supper.     mometasone-formoterol 200-5 MCG/ACT Aero  Commonly known as:  DULERA  Inhale 2 puffs into the lungs every 12 (twelve) hours.     nitroGLYCERIN 0.4 MG SL tablet  Commonly known as:  NITROSTAT  Place 0.4 mg under the tongue every 5 (five) minutes as needed for chest pain.     oxybutynin 5 MG tablet  Commonly known as:  DITROPAN  Take 5 mg by mouth every 8 (eight) hours as needed for bladder spasms (bladder spasms).     rosuvastatin 20 MG tablet  Commonly known as:  CRESTOR  Take 20 mg by mouth daily.      sennosides-docusate sodium 8.6-50 MG tablet  Commonly known as:  SENOKOT-S  Take 2 tablets by mouth daily.     tamsulosin 0.4 MG Caps capsule  Commonly known as:  FLOMAX  Take 1 capsule (0.4 mg total) by mouth at bedtime.     temazepam 15 MG capsule  Commonly known as:  RESTORIL  Take 1 capsule (15 mg total) by mouth at bedtime.     tiotropium 18 MCG inhalation capsule  Commonly known as:  SPIRIVA  Place 1 capsule (18 mcg total) into inhaler and inhale daily.     warfarin 1 MG tablet  Commonly known as:  COUMADIN  Take 1 tablet (1 mg total) by mouth daily. Continue taking Coumadin and have PT/INR checked 06/30/2014 to have the dose adjusted appropriately        Physical Exam  BP 129/68 mmHg  Pulse 72  Temp(Src) 97.8 F (36.6 C)  Resp 20  Ht 5\' 11"  (1.803 m)  Wt 177 lb (80.287 kg)  BMI 24.70 kg/m2  SpO2 98%  Constitutional: WDWN elderly male in no acute distress. Conversant and pleasant HEENT: Normocephalic and atraumatic. PERRL. EOM intact. No scleral icterus. Oral mucosa moist. Posterior pharynx clear of any exudate or lesions.   Neck: Supple and nontender. No lymphadenopathy, masses, or thyromegaly. No JVD or carotid bruits. Cardiac: Normal S1, S2. RRR without appreciable murmurs, rubs, or gallops. Distal pulses intact. 1+ pitting edema of BLE Lungs: No respiratory distress. Breath sounds diminished bilaterally without rales, rhonchi, or wheezes. Oxygen at 2lpm via nasal cannula in place.  Abdomen: Audible bowel sounds in all quadrants. Soft, nontender, nondistended. No  palpable mass.  Musculoskeletal: Able to move all extremities with generalized weakness. No joint erythema or tenderness.  Skin: Warm and dry. No rash noted. No erythema.  Neurological: Alert and oriented to person, place, and time. No focal deficits.  Psychiatric: Judgment and insight adequate. Appropriate mood and affect.   Labs Reviewed  CBC Latest Ref Rng 06/28/2014 06/27/2014 06/26/2014  WBC 4.0 -  10.5 K/uL 9.9 9.8 9.5  Hemoglobin 13.0 - 17.0 g/dL 9.4(L) 9.9(L) 9.7(L)  Hematocrit 39.0 - 52.0 % 29.9(L) 31.0(L) 30.4(L)  Platelets 150 - 400 K/uL 270 281 296    CMP Latest Ref Rng 06/27/2014 06/26/2014 06/25/2014  Glucose 70 - 99 mg/dL 110(H) 91 110(H)  BUN 6 - 23 mg/dL 26(H) 30(H) 36(H)  Creatinine 0.50 - 1.35 mg/dL 1.46(H) 1.55(H) 1.68(H)  Sodium 135 - 145 mmol/L 140 140 136  Potassium 3.5 - 5.1 mmol/L 3.8 3.2(L) 3.6  Chloride 96 - 112 mmol/L 99 99 104  CO2 19 - 32 mmol/L 34(H) 32 26  Calcium 8.4 - 10.5 mg/dL 8.6 8.5 8.3(L)  Total Protein 6.0 - 8.3 g/dL - - -  Total Bilirubin 0.3 - 1.2 mg/dL - - -  Alkaline Phos 39 - 117 U/L - - -  AST 0 - 37 U/L - - -  ALT 0 - 53 U/L - - -   Lab Results  Component Value Date   INR 1.3* 06/30/2014   INR 1.38 06/28/2014   INR 1.38 06/27/2014     Lab Results  Component Value Date   TSH 2.63 10/30/2013    Lipid Panel     Component Value Date/Time   CHOL 122 10/30/2013 1209   TRIG 95.0 10/30/2013 1209   TRIG 296 04/10/2009   HDL 50.30 10/30/2013 1209   CHOLHDL 2 10/30/2013 1209   VLDL 19.0 10/30/2013 1209   LDLCALC 53 10/30/2013 1209    Diagnostic Studies Reviewed 06/24/13: CXR: Mildly increased basilar interstitial densities are noted concerning for pulmonary edema with associated pleural effusions. Stable right upper lobe opacity is noted concerning for edema or possibly pneumonia.  Assessment & Plan 1. Paroxysmal atrial fibrillation Stable. RRR on exam. Continue amiodarone 200mg  daily. INR 1.3 today. Will increase coumadin to 1.5mg  daily and recheck INR 07/07/14. Continue lovenox 70mg  twice daily until INR 2-3. Per discharge summary, d/c lovenox if there is any evidence of recurrent hematuria. However, patient has had hematuria since discharge date per urology note. Will recheck cbc, if anemia worsens, will d/c lovenox. Continue to monitor his status.   2. Acute on chronic combined systolic and diastolic CHF (congestive heart  failure) Appears euvolemic on exam. Continue lasix 20mg  daily. Continue daily weight and monitor. Not on ace/arb due to CKD and recent ARF.  3. Acute respiratory failure with hypoxia Most likely in the setting of HCAP and CHF exacerbation as V-Q scan suggested low probability for PE and venous doppler negative for DVT. Continue oxygen 2lpm via [place] to maintain O2 sat >90%. Wean O2 off as tolerated. Nursing staff to monitor and document O2 sat with and without oxygen at rest, with activity, and during sleep.   4. Cancer of bladder wall S/p TURBT. Is being followed by urology. Continue to monitor   5. Acute blood loss anemia Recent h&h 9.4/299. Continue to monitor h&h  6. HLD (hyperlipidemia) Continue crestor 20mg  daily and monitor  7. HCAP (healthcare-associated pneumonia) Continue and complete Augmentin. Continue mucinex 600mg  every 12 hours as needed and xopenex via neb every 8 hours as needed  for shortness of breath and wheezing. Continue to monitor  8. Physical deconditioning Continue to work with PT/OT for gait/strength/balance training to restore/maximize functional capacity. Fall risk precautions. Continue to monitor his status.   9. Constipation Continue miralax daily. Start senna s 2 tabs daily and Dulcolax 10mg  suppository x 1 then daily PRN. Encourage adequate hydration and ambulation as tolerated.   10. Insomnia Continue restoril 15mg  daily at bedtime and monitor  11. COPD Continue spiriva 47mcg inhalation daily, pulmicort 0.25/64mL via neb twice daily, and xopenex via neb every 8 hours ad needed for shob and wheezing. Continue monitor his status.    Diagnostic Studies/Labs Ordered: cbc, cmp next lab draw  Time spent: 45 minutes on care coordination   Family/Staff Communication Plan of care discussed with patient and nursing staff. Patient and nursing staff verbalized understanding and agree with plan of care. No additional questions or concerns reported.    Arthur Holms,  MSN, AGNP-C [facility] [address] [place], [place] [address] [phone] [8am-5pm] After hours: [phone]

## 2014-06-30 NOTE — Telephone Encounter (Signed)
Called pt concerning TCM appt no answer LMOM  To RTC...Edward [name]

## 2014-06-30 NOTE — Telephone Encounter (Signed)
Called pt completed TCM call below.../lmb  Transition Care Management Follow-up Telephone Call D/C 06/28/14  How have you been since you were released from the hospital? Pt states he is doing alright   Do you understand why you were in the hospital? YES   Do you understand the discharge instrcutions? YES, wasn't able to go over d/c summary due to pt not being at home but he stated he did understood  Items Reviewed:  Medications reviewed: YES  Allergies reviewed: YES  Dietary changes reviewed: YES, heart healthy  Referrals reviewed: No referrals needed   Functional Questionnaire:   Activities of Daily Living (ADLs):   He states they are independent in the following: ambulation, bathing and hygiene, feeding, continence, grooming, toileting and dressing He states he doesn't require assistance   Any transportation issues/concerns?: NO   Any patient concerns? NO   Confirmed importance and date/time of follow-up visits scheduled: YES, made appt w/ Terri Piedra 07/22/14 due to Dr. Rachell Cipro not being in office. Pt is requesting appt card to be mail to his address   Confirmed with patient if condition begins to worsen call PCP or go to the ER.

## 2014-07-01 LAB — BASIC METABOLIC PANEL
BUN: 22 mg/dL — AB (ref 4–21)
CREATININE: 1.4 mg/dL — AB (ref 0.6–1.3)
GLUCOSE: 78 mg/dL
Potassium: 4.1 mmol/L (ref 3.4–5.3)
Sodium: 136 mmol/L — AB (ref 137–147)

## 2014-07-01 NOTE — Progress Notes (Signed)
Patient ID: Edward [name], male   DOB: 1929-10-03, 81 y.o.   MRN: [id number] Nursing supervisor called and reported increased hematuria. Will hold Lovenox injection until seen by urology. Continue to monitor his status.

## 2014-07-02 ENCOUNTER — Non-Acute Institutional Stay (SKILLED_NURSING_FACILITY): Payer: Medicare Other | Admitting: Internal Medicine

## 2014-07-02 DIAGNOSIS — I5032 Chronic diastolic (congestive) heart failure: Secondary | ICD-10-CM

## 2014-07-02 DIAGNOSIS — K5900 Constipation, unspecified: Secondary | ICD-10-CM

## 2014-07-02 DIAGNOSIS — G4700 Insomnia, unspecified: Secondary | ICD-10-CM

## 2014-07-02 DIAGNOSIS — R5381 Other malaise: Secondary | ICD-10-CM

## 2014-07-02 DIAGNOSIS — J189 Pneumonia, unspecified organism: Secondary | ICD-10-CM

## 2014-07-02 DIAGNOSIS — I480 Paroxysmal atrial fibrillation: Secondary | ICD-10-CM

## 2014-07-02 DIAGNOSIS — D62 Acute posthemorrhagic anemia: Secondary | ICD-10-CM

## 2014-07-02 DIAGNOSIS — C679 Malignant neoplasm of bladder, unspecified: Secondary | ICD-10-CM

## 2014-07-02 DIAGNOSIS — J449 Chronic obstructive pulmonary disease, unspecified: Secondary | ICD-10-CM

## 2014-07-03 ENCOUNTER — Encounter: Payer: Self-pay | Admitting: Internal Medicine

## 2014-07-03 NOTE — Progress Notes (Signed)
Patient ID: Edward [name], male   DOB: 1929-09-21, 81 y.o.   MRN: [id number]     Facility: [facility]   PCP: Gwendolyn Grant, MD   Allergies  Allergen Reactions  . Ace Inhibitors Cough  . Clarithromycin Other (See Comments)    Strange thoughts and fell with biaxin  . Codeine Nausea And Vomiting  . Oxycodone-Acetaminophen Other (See Comments)    Felt closed in    Chief Complaint  Patient presents with  . New Admit To SNF     HPI:  81 year old patient is here for short term rehabilitation post hospital admission from 06/16/14 to 06/28/14 for acute exacerbation of CHF, HCAP and acute blood loss anemia from GU bleed s/p TURBT of  Bladder tumor on 06/16/14. He has non muscle invasive bladder cancer. He was having gross hematuria and his lovenox and coumadin were held last night. He was seen by urology this am. He has PMH of COPD, CAD s/p CABG x5, HLD, paroxysmal afib on coumadin, s/p pacemaker on coumadin, CHF, bladder wall cancer s/p TURBT. He is seen in his room today. He has worked with therapy team. He is off his condom catheter. He is on o2. His breathing is stable at rest but gets short of breath with minimal exertion, gets tired easily and still has some cough.   Review of Systems:  Constitutional: Negative for fever, chills, diaphoresis.  HENT: Negative for headache, congestion, nasal discharge Eyes: Negative for eye pain, blurred vision, double vision and discharge.  Respiratory: Negative forwheezing.   Cardiovascular: Negative for chest pain, palpitations. Has leg swelling.  Gastrointestinal: Negative for heartburn, nausea, vomiting, abdominal pain. Last bowel movement 2 days back and feels like he is going to have one today Genitourinary: Negative for dysuria and flank pain. has hematuria Musculoskeletal: Negative for back pain, falls Skin: Negative for itching, rash.  Neurological: Negative for dizziness, tingling, focal  weakness Psychiatric/Behavioral: Negative for depression  Past Medical History  Diagnosis Date  . Gout     "only once in my lifetime" (12/18/2012)  . Depression   . PVD (peripheral vascular disease)     s/p B CEA  . COPD (chronic obstructive pulmonary disease)      PFT 6.19.07: FEV1 465  ratio 60 with 25% response to B2.  > PFTs 8.29.07: FEV1 61%  ratio 46% no better after B2.  > add on advair 12.20.11-improved 1.31.12  . AAA (abdominal aortic aneurysm)   . Melanosis coli   . Internal hemorrhoids   . Carotid artery occlusion   . Aortic valve disorder   . Chronic diastolic heart failure 46/01/6294  . CAD (coronary artery disease), native coronary artery     CABG w LIMA to LAD, SVG to dx, OM, RCA 1989 Dr. Arlyce Dice for 3VD PTCA of OM, 1999 and 2000 Cath showed occlusion of left main and RCA with stenosis in OM Redo redo CABG w SVG to OM, SVG to RCA8/10/00 Dr. Cyndia Bent   . Hyperlipidemia   . BPH (benign prostatic hypertrophy)   . Hypertensive heart disease     Change toprol to bisoprolol 10 mg daily on trial basis  05/21/2012  - notified by [facility] non adherent 07/17/2012    . LBBB (left bundle branch block)   . Second degree heart block 12/18/2012    s/p MDT Adapta L pacemaker 12-20-2012 by Dr Caryl Comes  . Shingles years ago    mild  . Abrasion of left forearm dec 2015  small scraped area healing  . Chronic kidney disease stage III (GFR 30-59 ml/min)     saw dr Justin Mend oct 2015 and released by dr webb  . Congenital absence of kidney     "noted during AAA repair; never knew it before" (12/18/2012)  . Cancer     bladder  . Myocardial infarction 1979   Past Surgical History  Procedure Laterality Date  . Cholecystectomy  1998  . Hemiarthroplasty shoulder fracture Right ~ 2008     x 2 - [facility]  . Carotid endarterectomy Bilateral 1990's  . Abdominal aortic aneurysm repair  1998  . Carotid-subclavian bypass graft Left 1990's  . Anal fissure repair      12/18/2012 "I don't remember this"  .  Hemorroidectomy      12/18/2012 "I don't remember this"  . Carpal tunnel release Right 1980's    "Dr. Marrian Salvage" (12/18/2012)  . Cardiac catheterization      "2 or 3" (12/18/2012)  . Coronary angioplasty with stent placement      "I've got 1 or 2" (12/18/2012)  . Hip fracture surgery  1944    "fell out of a tree; had it operated on 3 times" (12/18/2012)  . Refractive surgery Bilateral 1990's?  . Pacemaker insertion  12-20-2012    dual chamber Medtronic Adapta L pacemaker implanted by Dr Caryl Comes  . Cardioversion N/A 12/30/2013    Procedure: CARDIOVERSION;  Surgeon: Jacolyn Reedy, MD;  Location: [facility] [place];  Service: Cardiovascular;  Laterality: N/A;  . Permanent pacemaker insertion N/A 12/20/2012    Procedure: PERMANENT PACEMAKER INSERTION;  Surgeon: Deboraha Sprang, MD;  Location: [facility] CATH LAB;  Service: Cardiovascular;  Laterality: N/A;  . Coronary artery bypass graft  1989, 2000     "CABG X ?3; CABG X 5 w//Bartle" (12/18/2012)  . Transurethral resection of bladder tumor N/A 06/16/2014    Procedure: TRANSURETHRAL RESECTION OF BLADDER TUMOR (TURBT) ;  Surgeon: Jorja Loa, MD;  Location: [facility];  Service: Urology;  Laterality: N/A;   Social History:   reports that he quit smoking about 18 years ago. His smoking use included Cigarettes. He has a 75 pack-year smoking history. He has never used smokeless tobacco. He reports that he drinks about 1.8 oz of alcohol per week. He reports that he does not use illicit drugs.  Family History  Problem Relation Age of Onset  . Heart disease Mother   . Colon cancer Neg Hx     Medications: Patient's Medications  New Prescriptions   No medications on file  Previous Medications   ACETAMINOPHEN (TYLENOL) 325 MG TABLET    Take 2 tablets (650 mg total) by mouth every 6 (six) hours as needed for fever.   AMIODARONE (PACERONE) 200 MG TABLET    Take 200 mg by mouth daily.   AMOXICILLIN-CLAVULANATE (AUGMENTIN) 500-125 MG PER TABLET    Take 1  tablet (500 mg total) by mouth 3 (three) times daily.   BISACODYL (DULCOLAX) 10 MG SUPPOSITORY    Place 10 mg rectally daily as needed for moderate constipation or severe constipation.   BUDESONIDE (PULMICORT) 0.25 MG/2ML NEBULIZER SOLUTION    Take 2 mLs (0.25 mg total) by nebulization 2 (two) times daily.   COLCHICINE 0.6 MG TABLET    Take 0.6 mg by mouth daily as needed (gout flare ups).   ENOXAPARIN (LOVENOX) 80 MG/0.8ML INJECTION    Inject 0.75 mLs (75 mg total) into the skin every 12 (twelve) hours. Continue taking Lovenox and Coumadin  and make sure taht PT/INR is checked 06/30/2014 so that you can be instructed on the dosing of Coumadin and duration of Lovenox treatment   FUROSEMIDE (LASIX) 20 MG TABLET    Take 1 tablet (20 mg total) by mouth daily.   GUAIFENESIN (MUCINEX) 600 MG 12 HR TABLET    Take by mouth as needed.   HYDROCODONE-ACETAMINOPHEN (NORCO) 5-325 MG PER TABLET    Take 1-2 tablets by mouth every 6 (six) hours as needed for moderate pain.   LEVALBUTEROL (XOPENEX) 0.63 MG/3ML NEBULIZER SOLUTION    Take 3 mLs (0.63 mg total) by nebulization every 8 (eight) hours as needed for wheezing or shortness of breath.   MOMETASONE-FORMOTEROL (DULERA) 200-5 MCG/ACT AERO    Inhale 2 puffs into the lungs every 12 (twelve) hours.   NITROGLYCERIN (NITROSTAT) 0.4 MG SL TABLET    Place 0.4 mg under the tongue every 5 (five) minutes as needed for chest pain.   OXYBUTYNIN (DITROPAN) 5 MG TABLET    Take 5 mg by mouth every 8 (eight) hours as needed for bladder spasms (bladder spasms).    POLYETHYLENE GLYCOL 3350 (MIRALAX PO)    Take 1 scoop by mouth daily after supper.   ROSUVASTATIN (CRESTOR) 20 MG TABLET    Take 20 mg by mouth daily.     SENNOSIDES-DOCUSATE SODIUM (SENOKOT-S) 8.6-50 MG TABLET    Take 2 tablets by mouth daily.   TAMSULOSIN (FLOMAX) 0.4 MG CAPS CAPSULE    Take 1 capsule (0.4 mg total) by mouth at bedtime.   TEMAZEPAM (RESTORIL) 15 MG CAPSULE    Take 1 capsule (15 mg total) by mouth at  bedtime.   TIOTROPIUM (SPIRIVA) 18 MCG INHALATION CAPSULE    Place 1 capsule (18 mcg total) into inhaler and inhale daily.   WARFARIN (COUMADIN) 1 MG TABLET    Take 1 tablet (1 mg total) by mouth daily. Continue taking Coumadin and have PT/INR checked 06/30/2014 to have the dose adjusted appropriately  Modified Medications   No medications on file  Discontinued Medications   No medications on file     Physical Exam: Filed Vitals:   07/03/14 0737  BP: 125/66  Pulse: 77  Temp: 98.3 F (36.8 C)  Resp: 18    General- elderly male, thin built, in no acute distress Head- normocephalic, atraumatic Throat- moist mucus membrane Eyes- PERRLA, EOMI, no pallor, no icterus, no discharge, normal conjunctiva, normal sclera Neck- no cervical lymphadenopathy Cardiovascular- normal s1,s2, no murmurs, palpable dorsalis pedis and radial pulses, 1+ leg edema Respiratory- bilateral decreased air entry, no wheeze, no rhonchi, no crackles, no use of accessory muscles, on o2 Abdomen- bowel sounds present, soft, non tender Musculoskeletal- able to move all 4 extremities, generalized weakness  Neurological- no focal deficit Skin- warm and dry Psychiatry- alert and oriented to person, place and time, normal mood and affect    Labs reviewed: Basic Metabolic Panel:  Recent Labs  06/25/14 0201 06/26/14 0530 06/27/14 0520  NA 136 140 140  K 3.6 3.2* 3.8  CL 104 99 99  CO2 26 32 34*  GLUCOSE 110* 91 110*  BUN 36* 30* 26*  CREATININE 1.68* 1.55* 1.46*  CALCIUM 8.3* 8.5 8.6   Liver Function Tests:  Recent Labs  10/30/13 1209 06/18/14 1708  AST 21 34  ALT 16 17  ALKPHOS 65 92  BILITOT 0.8 1.1  PROT 6.5 5.4*  ALBUMIN 3.8 2.6*   No results for input(s): LIPASE, AMYLASE in the last 8760 hours. No results  for input(s): AMMONIA in the last 8760 hours. CBC:  Recent Labs  10/30/13 1209 04/05/14 2056  06/26/14 0530 06/27/14 0520 06/28/14 0516  WBC 8.0 8.1  < > 9.5 9.8 9.9  NEUTROABS  5.4 5.8  --   --   --   --   HGB 11.8* 9.6*  < > 9.7* 9.9* 9.4*  HCT 35.4* 28.5*  < > 30.4* 31.0* 29.9*  MCV 93.3 93.1  < > 93.3 92.8 93.1  PLT 156.0 192  < > 296 281 270  < > = values in this interval not displayed. Cardiac Enzymes:  Recent Labs  06/24/14 1445 06/24/14 2031 06/25/14 0201  TROPONINI 0.03 0.04* 0.04*   BNP: Invalid input(s): POCBNP CBG:  Recent Labs  06/16/14 2225  GLUCAP 95    Assessment/Plan  Physical deconditioning Will have him work with physical therapy and occupational therapy team to help with gait training and muscle strengthening exercises.fall precautions. Skin care. Encourage to be out of bed.   Acute chf exacerbation Continue lasix 20 mg daily and monitor daily weight. Continue o2   Paroxysmal atrial fibrillation Stable. RRR on exam. Continue amiodarone 200mg  daily. INR 1.5 today. Continue lovenox 70mg  twice daily until INR 2-3 with coumadin.   HCAP Continue and complete course of augmentin, oxygen 2lpm via [place] to maintain O2 sat >90%. Continue mucinex 600mg  every 12 hours as needed and xopenex via neb every 8 hours as needed for shortness of breath and wheezing.  Cancer of bladder wall S/p TURBT. Is being followed by urology. Reviewed notes.Continue to monitor   Acute blood loss anemia Continue to monitor h&h  Constipation Continue miralax daily. Start senna s 2 tabs daily and Dulcolax 10mg  suppository x 1 then daily PRN. Encourage adequate hydration and ambulation as tolerated.   Insomnia Continue restoril 15mg  daily at bedtime and monitor  COPD Continue spiriva 86mcg inhalation daily, pulmicort 0.25/75mL via neb twice daily, and xopenex via neb every 8 hours ad needed for shob and wheezing. Continue monitor his status.    Goals of care: short term rehabilitation   Labs/tests ordered: cbc, inr  Family/ staff Communication: reviewed care plan with patient and nursing supervisor    Blanchie Serve, MD  [facility]  [facility] [phone] (Monday-Friday 8 am - 5 pm) [phone] (afterhours)

## 2014-07-07 ENCOUNTER — Non-Acute Institutional Stay (SKILLED_NURSING_FACILITY): Payer: Medicare Other | Admitting: Registered Nurse

## 2014-07-07 ENCOUNTER — Encounter: Payer: Self-pay | Admitting: Registered Nurse

## 2014-07-07 DIAGNOSIS — J988 Other specified respiratory disorders: Secondary | ICD-10-CM

## 2014-07-07 DIAGNOSIS — R791 Abnormal coagulation profile: Secondary | ICD-10-CM

## 2014-07-07 DIAGNOSIS — K5900 Constipation, unspecified: Secondary | ICD-10-CM

## 2014-07-07 DIAGNOSIS — I480 Paroxysmal atrial fibrillation: Secondary | ICD-10-CM

## 2014-07-07 LAB — POCT INR: INR: 3.6 — AB (ref 0.9–1.1)

## 2014-07-07 LAB — CBC AND DIFFERENTIAL
HCT: 28 % — AB (ref 41–53)
Hemoglobin: 9 g/dL — AB (ref 13.5–17.5)
Platelets: 291 10*3/uL (ref 150–399)
WBC: 8.7 10^3/mL

## 2014-07-07 NOTE — Progress Notes (Addendum)
Patient ID: Edward [name], male   DOB: 01/09/1930, 81 y.o.   MRN: [id number]   Place of Service: [facility]  Allergies  Allergen Reactions  . Ace Inhibitors Cough  . Clarithromycin Other (See Comments)    Strange thoughts and fell with biaxin  . Codeine Nausea And Vomiting  . Oxycodone-Acetaminophen Other (See Comments)    Felt closed in    Code Status: Full Code  Goals of Care: Longevity/STR  Chief Complaint  Patient presents with  . Acute Visit    coumadin management    HPI  81 y.o. male with PMH of COPD, PVD, depression, CAD, HTN among others is being seen for management of warfarin therapy. Patient is on long-term warfarin anticoagulation for paroxysmal atrial fib. Goal INR is 2 to 3. Today INR is 3.6 and current warfarin dose is 2mg  daily on Mon, Wed, Fri and 1.5mg  daily on all others day. No missed or extra doses in past week reported. No recent changes in medications or diet reported. No upcoming procedure. Seen in room today, reported still having constipation. Last BM 3 days ago. Would like to have miralax twice daily mix in plain water rather than miralax daily and dulcolax supposity prn. Also would like to have mucinex for congestion as he has been taking it regularly at home. Denies other concerns.    Review of Systems Constitutional: Negative for fever and chills. Cardiovascular: Negative for chest pain and palpitation. Positive for leg swelling Respiratory: Negative cough and wheezing. Positive for congestion and shortness of breath with exertion (no more than usual) Gastrointestinal: Negative for nausea and vomiting. Negative for abdominal pain. Negative for bloody stool.   Genitourinary: Positive for hematuria (s/p TURBT) Musculoskeletal: Negative for back pain, joint pain, and joint swelling  Neurological: Negative for dizziness and headache Skin: Negative for rash and wound.   Psychiatric: Negative for depression  Past Medical History  Diagnosis  Date  . Gout     "only once in my lifetime" (12/18/2012)  . Depression   . PVD (peripheral vascular disease)     s/p B CEA  . COPD (chronic obstructive pulmonary disease)      PFT 6.19.07: FEV1 465  ratio 60 with 25% response to B2.  > PFTs 8.29.07: FEV1 61%  ratio 46% no better after B2.  > add on advair 12.20.11-improved 1.31.12  . AAA (abdominal aortic aneurysm)   . Melanosis coli   . Internal hemorrhoids   . Carotid artery occlusion   . Aortic valve disorder   . Chronic diastolic heart failure 77/08/1285  . CAD (coronary artery disease), native coronary artery     CABG w LIMA to LAD, SVG to dx, OM, RCA 1989 Dr. Arlyce Dice for 3VD PTCA of OM, 1999 and 2000 Cath showed occlusion of left main and RCA with stenosis in OM Redo redo CABG w SVG to OM, SVG to RCA8/10/00 Dr. Cyndia Bent   . Hyperlipidemia   . BPH (benign prostatic hypertrophy)   . Hypertensive heart disease     Change toprol to bisoprolol 10 mg daily on trial basis  05/21/2012  - notified by [facility] non adherent 07/17/2012    . LBBB (left bundle branch block)   . Second degree heart block 12/18/2012    s/p MDT Adapta L pacemaker 12-20-2012 by Dr Caryl Comes  . Shingles years ago    mild  . Abrasion of left forearm dec 2015    small scraped area healing  . Chronic kidney disease stage  III (GFR 30-59 ml/min)     saw dr Justin Mend oct 2015 and released by dr webb  . Congenital absence of kidney     "noted during AAA repair; never knew it before" (12/18/2012)  . Cancer     bladder  . Myocardial infarction 1979    Past Surgical History  Procedure Laterality Date  . Cholecystectomy  1998  . Hemiarthroplasty shoulder fracture Right ~ 2008     x 2 - [facility]  . Carotid endarterectomy Bilateral 1990's  . Abdominal aortic aneurysm repair  1998  . Carotid-subclavian bypass graft Left 1990's  . Anal fissure repair      12/18/2012 "I don't remember this"  . Hemorroidectomy      12/18/2012 "I don't remember this"  . Carpal tunnel release Right 1980's     "Dr. Marrian Salvage" (12/18/2012)  . Cardiac catheterization      "2 or 3" (12/18/2012)  . Coronary angioplasty with stent placement      "I've got 1 or 2" (12/18/2012)  . Hip fracture surgery  1944    "fell out of a tree; had it operated on 3 times" (12/18/2012)  . Refractive surgery Bilateral 1990's?  . Pacemaker insertion  12-20-2012    dual chamber Medtronic Adapta L pacemaker implanted by Dr Caryl Comes  . Cardioversion N/A 12/30/2013    Procedure: CARDIOVERSION;  Surgeon: Jacolyn Reedy, MD;  Location: [place];  Service: Cardiovascular;  Laterality: N/A;  . Permanent pacemaker insertion N/A 12/20/2012    Procedure: PERMANENT PACEMAKER INSERTION;  Surgeon: Deboraha Sprang, MD;  Location: [facility] CATH LAB;  Service: Cardiovascular;  Laterality: N/A;  . Coronary artery bypass graft  1989, 2000     "CABG X ?3; CABG X 5 w//Bartle" (12/18/2012)  . Transurethral resection of bladder tumor N/A 06/16/2014    Procedure: TRANSURETHRAL RESECTION OF BLADDER TUMOR (TURBT) ;  Surgeon: Jorja Loa, MD;  Location: [facility];  Service: Urology;  Laterality: N/A;    History   Social History  . Marital Status: Widowed    Spouse Name: N/A    Number of Children: N/A  . Years of Education: N/A   Occupational History  . retired from [employer]  . Smoking status: Former Smoker -- 1.50 packs/day for 50 years    Types: Cigarettes    Quit date: 05/30/1996  . Smokeless tobacco: Never Used  . Alcohol Use: 1.8 oz/week    3 Cans of beer per week     Comment: beer few times per week  . Drug Use: No  . Sexual Activity: No   Other Topics Concern  . Not on file   Social History Narrative   Lives alone. Widower.    Family History  Problem Relation Age of Onset  . Heart disease Mother   . Colon cancer Neg Hx       Medication List       This list is accurate as of: 07/07/14  9:09 PM.  Always use your most recent med list.               acetaminophen 325 MG tablet    Commonly known as:  TYLENOL  Take 2 tablets (650 mg total) by mouth every 6 (six) hours as needed for fever.     amiodarone 200 MG tablet  Commonly known as:  PACERONE  Take 200 mg by mouth daily.     amoxicillin-clavulanate 500-125 MG per tablet  Commonly known  as:  AUGMENTIN  Take 1 tablet (500 mg total) by mouth 3 (three) times daily.     bisacodyl 10 MG suppository  Commonly known as:  DULCOLAX  Place 10 mg rectally daily as needed for moderate constipation or severe constipation.     budesonide 0.25 MG/2ML nebulizer solution  Commonly known as:  PULMICORT  Take 2 mLs (0.25 mg total) by nebulization 2 (two) times daily.     colchicine 0.6 MG tablet  Take 0.6 mg by mouth daily as needed (gout flare ups).     furosemide 20 MG tablet  Commonly known as:  LASIX  Take 1 tablet (20 mg total) by mouth daily.     guaiFENesin 600 MG 12 hr tablet  Commonly known as:  MUCINEX  Take 600 mg by mouth 2 (two) times daily.     HYDROcodone-acetaminophen 5-325 MG per tablet  Commonly known as:  NORCO  Take 1-2 tablets by mouth every 6 (six) hours as needed for moderate pain.     levalbuterol 0.63 MG/3ML nebulizer solution  Commonly known as:  XOPENEX  Take 3 mLs (0.63 mg total) by nebulization every 8 (eight) hours as needed for wheezing or shortness of breath.     mometasone-formoterol 200-5 MCG/ACT Aero  Commonly known as:  DULERA  Inhale 2 puffs into the lungs every 12 (twelve) hours.     nitroGLYCERIN 0.4 MG SL tablet  Commonly known as:  NITROSTAT  Place 0.4 mg under the tongue every 5 (five) minutes as needed for chest pain.     oxybutynin 5 MG tablet  Commonly known as:  DITROPAN  Take 5 mg by mouth every 8 (eight) hours as needed for bladder spasms (bladder spasms).     polyethylene glycol packet  Commonly known as:  MIRALAX / GLYCOLAX  Take 17 g by mouth 2 (two) times daily.     rosuvastatin 20 MG tablet  Commonly known as:  CRESTOR  Take 20 mg by mouth daily.      sennosides-docusate sodium 8.6-50 MG tablet  Commonly known as:  SENOKOT-S  Take 2 tablets by mouth daily.     tamsulosin 0.4 MG Caps capsule  Commonly known as:  FLOMAX  Take 1 capsule (0.4 mg total) by mouth at bedtime.     temazepam 15 MG capsule  Commonly known as:  RESTORIL  Take 1 capsule (15 mg total) by mouth at bedtime.     tiotropium 18 MCG inhalation capsule  Commonly known as:  SPIRIVA  Place 1 capsule (18 mcg total) into inhaler and inhale daily.     warfarin 1 MG tablet  Commonly known as:  COUMADIN  Take 1 tablet (1 mg total) by mouth daily. Continue taking Coumadin and have PT/INR checked 06/30/2014 to have the dose adjusted appropriately        Physical Exam  BP 103/66 mmHg  Pulse 81  Temp(Src) 98.4 F (36.9 C)  Resp 16  Ht 5\' 11"  (1.803 m)  Wt 162 lb (73.483 kg)  BMI 22.60 kg/m2  SpO2 94%  Constitutional: WDWN elderly male in no acute distress. Conversant and pleasant HEENT: Normocephalic and atraumatic. PERRL. EOM intact. No scleral icterus. Oral mucosa moist. Posterior pharynx clear of any exudate or lesions.  Neck: Supple and nontender. No lymphadenopathy, masses, or thyromegaly. No JVD or carotid bruits. Cardiac: Normal S1, S2. RRR without appreciable murmurs, rubs, or gallops. Distal pulses intact. 1+ pitting edema of BLE Lungs: No respiratory distress. Breath sounds diminished bilaterally without  rales, rhonchi, or wheezes. Oxygen at 2lpm via nasal cannula in place.  Abdomen: Audible bowel sounds in all quadrants. Soft, nontender, nondistended. No palpable mass.  Musculoskeletal: Able to move all extremities with generalized weakness. No joint erythema or tenderness.  Skin: Warm and dry. No rash noted. No erythema.  Neurological: Alert and oriented to person, place, and time. No focal deficits.  Psychiatric: Judgment and insight adequate. Appropriate mood and affect.   Labs Reviewed  Lab Results  Component Value Date   INR 3.6* 07/07/2014    INR 1.3* 06/30/2014   INR 1.38 06/28/2014     Assessment & Plan 1. Supratherapeutic INR 2. Paroxysmal atrial fibrillation INR 3.6 today. Will hold Coumadin x 2 days. Recheck INR daily until INR <3.0. Continue to monitor his status.   3. Constipation, unspecified constipation type Will change miralax to 17g mix in 4-8oz plain water twice daily for constipation per patient's request. Continue dulcolax 10mg  suppository PRN for moderate to sever constipation. Continue to monitor   4. Congestion of upper airway Start mucinex 600mg  twice daily and monitor.    Family/Staff Communication Plan of care discussed with resident and nursing staff. Resident and nursing staff verbalized understanding and agree with plan of care. No additional questions or concerns reported.    Arthur Holms, MSN, AGNP-C [facility] [address] [place], [place] [address] [phone] [8am-5pm] After hours: [phone]

## 2014-07-21 ENCOUNTER — Non-Acute Institutional Stay (SKILLED_NURSING_FACILITY): Payer: Medicare Other | Admitting: Registered Nurse

## 2014-07-21 ENCOUNTER — Encounter: Payer: Self-pay | Admitting: Registered Nurse

## 2014-07-21 DIAGNOSIS — R791 Abnormal coagulation profile: Secondary | ICD-10-CM

## 2014-07-21 DIAGNOSIS — I480 Paroxysmal atrial fibrillation: Secondary | ICD-10-CM | POA: Diagnosis not present

## 2014-07-21 LAB — POCT INR: INR: 3.2 — AB (ref 0.9–1.1)

## 2014-07-21 NOTE — Progress Notes (Signed)
Patient ID: Edward [name], male   DOB: 05-Feb-1930, 81 y.o.   MRN: [id number]   Place of Service: [facility]  Allergies  Allergen Reactions  . Ace Inhibitors Cough  . Clarithromycin Other (See Comments)    Strange thoughts and fell with biaxin  . Codeine Nausea And Vomiting  . Oxycodone-Acetaminophen Other (See Comments)    Felt closed in    Code Status: Full Code  Goals of Care: Longevity/STR  Chief Complaint  Patient presents with  . Acute Visit    elevated INR    HPI  81 y.o. male with PMH of COPD, PVD, depression, CAD, HTN among others is being seen for management of warfarin therapy. Patient is on long-term warfarin anticoagulation for paroxysmal atrial fib. Goal INR is 2 to 3. Today INR is 3.2 and current warfarin dose is 1.5mg  daily. Warfarin has been on hold since 07/17/14. No recent changes in medications or diet reported. No upcoming procedure. Seen in therapy today. Denies any concerns.    Review of Systems Constitutional: Negative for fever and chills. Cardiovascular: Negative for chest pain and palpitation.  Respiratory: Negative cough and wheezing. Positive for shortness of breath with exertion (no more than usual) Gastrointestinal: Negative for nausea and vomiting. Negative for abdominal pain. Negative for bloody stool.   Genitourinary: Negative for hematuria.  Musculoskeletal: Negative for back pain, joint pain, and joint swelling  Neurological: Negative for dizziness and headache Skin: Negative for rash and wound.   Psychiatric: Negative for depression  Past Medical History  Diagnosis Date  . Gout     "only once in my lifetime" (12/18/2012)  . Depression   . PVD (peripheral vascular disease)     s/p B CEA  . COPD (chronic obstructive pulmonary disease)      PFT 6.19.07: FEV1 465  ratio 60 with 25% response to B2.  > PFTs 8.29.07: FEV1 61%  ratio 46% no better after B2.  > add on advair 12.20.11-improved 1.31.12  . AAA (abdominal aortic  aneurysm)   . Melanosis coli   . Internal hemorrhoids   . Carotid artery occlusion   . Aortic valve disorder   . Chronic diastolic heart failure 65/01/9356  . CAD (coronary artery disease), native coronary artery     CABG w LIMA to LAD, SVG to dx, OM, RCA 1989 Dr. Arlyce Dice for 3VD PTCA of OM, 1999 and 2000 Cath showed occlusion of left main and RCA with stenosis in OM Redo redo CABG w SVG to OM, SVG to RCA8/10/00 Dr. Cyndia Bent   . Hyperlipidemia   . BPH (benign prostatic hypertrophy)   . Hypertensive heart disease     Change toprol to bisoprolol 10 mg daily on trial basis  05/21/2012  - [facility] non adherent 07/17/2012    . LBBB (left bundle branch block)   . Second degree heart block 12/18/2012    s/p MDT Adapta L pacemaker 12-20-2012 by Dr Caryl Comes  . Shingles years ago    mild  . Abrasion of left forearm dec 2015    small scraped area healing  . Chronic kidney disease stage III (GFR 30-59 ml/min)     saw dr Justin Mend oct 2015 and released by dr webb  . Congenital absence of kidney     "noted during AAA repair; never knew it before" (12/18/2012)  . Cancer     bladder  . Myocardial infarction 1979    Past Surgical History  Procedure Laterality Date  . Cholecystectomy  1998  . Hemiarthroplasty shoulder fracture Right ~ 2008     x 2 - [facility]  . Carotid endarterectomy Bilateral 1990's  . Abdominal aortic aneurysm repair  1998  . Carotid-subclavian bypass graft Left 1990's  . Anal fissure repair      12/18/2012 "I don't remember this"  . Hemorroidectomy      12/18/2012 "I don't remember this"  . Carpal tunnel release Right 1980's    "Dr. Marrian Salvage" (12/18/2012)  . Cardiac catheterization      "2 or 3" (12/18/2012)  . Coronary angioplasty with stent placement      "I've got 1 or 2" (12/18/2012)  . Hip fracture surgery  1944    "fell out of a tree; had it operated on 3 times" (12/18/2012)  . Refractive surgery Bilateral 1990's?  . Pacemaker insertion  12-20-2012    dual chamber  Medtronic Adapta L pacemaker implanted by Dr Caryl Comes  . Cardioversion N/A 12/30/2013    Procedure: CARDIOVERSION;  Surgeon: Jacolyn Reedy, MD;  Location: [place];  Service: Cardiovascular;  Laterality: N/A;  . Permanent pacemaker insertion N/A 12/20/2012    Procedure: PERMANENT PACEMAKER INSERTION;  Surgeon: Deboraha Sprang, MD;  Location: [facility] C[facility];  Service: Cardiovascular;  Laterality: N/A;  . Coronary artery bypass graft  1989, 2000     "CABG X ?3; CABG X 5 w//Bartle" (12/18/2012)  . Transurethral resection of bladder tumor N/A 06/16/2014    Procedure: TRANSURETHRAL RESECTION OF BLADDER TUMOR (TURBT) ;  Surgeon: Jorja Loa, MD;  Location: [facility];  Service: Urology;  Laterality: N/A;    History   Social History  . Marital Status: Widowed    Spouse Name: N/A  . Number of Children: N/A  . Years of Education: N/A   Occupational History  . retired from [employer]  . Smoking status: Former Smoker -- 1.50 packs/day for 50 years    Types: Cigarettes    Quit date: 05/30/1996  . Smokeless tobacco: Never Used  . Alcohol Use: 1.8 oz/week    3 Cans of beer per week     Comment: beer few times per week  . Drug Use: No  . Sexual Activity: No   Other Topics Concern  . Not on file   Social History Narrative   Lives alone. Widower.    Family History  Problem Relation Age of Onset  . Heart disease Mother   . Colon cancer Neg Hx       Medication List       This list is accurate as of: 07/21/14  3:27 PM.  Always use your most recent med list.               acetaminophen 325 MG tablet  Commonly known as:  TYLENOL  Take 2 tablets (650 mg total) by mouth every 6 (six) hours as needed for fever.     amiodarone 200 MG tablet  Commonly known as:  PACERONE  Take 200 mg by mouth daily.     amoxicillin-clavulanate 500-125 MG per tablet  Commonly known as:  AUGMENTIN  Take 1 tablet (500 mg total) by mouth 3 (three) times daily.      bisacodyl 10 MG suppository  Commonly known as:  DULCOLAX  Place 10 mg rectally daily as needed for moderate constipation or severe constipation.     budesonide 0.25 MG/2ML nebulizer solution  Commonly known as:  PULMICORT  Take 2 mLs (0.25 mg total)  by nebulization 2 (two) times daily.     colchicine 0.6 MG tablet  Take 0.6 mg by mouth daily as needed (gout flare ups).     furosemide 20 MG tablet  Commonly known as:  LASIX  Take 1 tablet (20 mg total) by mouth daily.     guaiFENesin 600 MG 12 hr tablet  Commonly known as:  MUCINEX  Take 600 mg by mouth 2 (two) times daily.     HYDROcodone-acetaminophen 5-325 MG per tablet  Commonly known as:  NORCO  Take 1-2 tablets by mouth every 6 (six) hours as needed for moderate pain.     levalbuterol 0.63 MG/3ML nebulizer solution  Commonly known as:  XOPENEX  Take 3 mLs (0.63 mg total) by nebulization every 8 (eight) hours as needed for wheezing or shortness of breath.     mometasone-formoterol 200-5 MCG/ACT Aero  Commonly known as:  DULERA  Inhale 2 puffs into the lungs every 12 (twelve) hours.     nitroGLYCERIN 0.4 MG SL tablet  Commonly known as:  NITROSTAT  Place 0.4 mg under the tongue every 5 (five) minutes as needed for chest pain.     oxybutynin 5 MG tablet  Commonly known as:  DITROPAN  Take 5 mg by mouth every 8 (eight) hours as needed for bladder spasms (bladder spasms).     polyethylene glycol packet  Commonly known as:  MIRALAX / GLYCOLAX  Take 17 g by mouth 2 (two) times daily.     rosuvastatin 20 MG tablet  Commonly known as:  CRESTOR  Take 20 mg by mouth daily.     sennosides-docusate sodium 8.6-50 MG tablet  Commonly known as:  SENOKOT-S  Take 2 tablets by mouth daily.     tamsulosin 0.4 MG Caps capsule  Commonly known as:  FLOMAX  Take 1 capsule (0.4 mg total) by mouth at bedtime.     temazepam 15 MG capsule  Commonly known as:  RESTORIL  Take 1 capsule (15 mg total) by mouth at bedtime.      tiotropium 18 MCG inhalation capsule  Commonly known as:  SPIRIVA  Place 1 capsule (18 mcg total) into inhaler and inhale daily.     warfarin 1 MG tablet  Commonly known as:  COUMADIN  Take 1 tablet (1 mg total) by mouth daily. Continue taking Coumadin and have PT/INR checked 06/30/2014 to have the dose adjusted appropriately        Physical Exam  BP 126/78 mmHg  Pulse 78  Temp(Src) 99.4 F (37.4 C)  Resp 19  SpO2 97%  Constitutional: WDWN elderly male in no acute distress. Conversant and pleasant HEENT: Normocephalic and atraumatic. PERRL. EOM intact. No scleral icterus. Oral mucosa moist. Posterior pharynx clear of any exudate or lesions.  Neck: Supple and nontender. No lymphadenopathy, masses, or thyromegaly. No JVD or carotid bruits. Cardiac: Normal S1, S2. RRR without appreciable murmurs, rubs, or gallops. Distal pulses intact. 1+ pitting edema of BLE Lungs: No respiratory distress. Breath sounds diminished bilaterally without rales, rhonchi, or wheezes.  Abdomen: Audible bowel sounds in all quadrants. Soft, nontender, nondistended. No palpable mass.  Musculoskeletal: Able to move all extremities with generalized weakness. No joint erythema or tenderness.  Skin: Warm and dry. No rash noted. No erythema.  Neurological: Alert and oriented to person, place, and time.  Psychiatric: Judgment and insight adequate. Appropriate mood and affect.   Labs Reviewed  Lab Results  Component Value Date   INR 3.2* 07/21/2014   INR  3.6* 07/07/2014   INR 1.3* 06/30/2014     Assessment & Plan 1. Supratherapeutic INR 2. Paroxysmal atrial fibrillation INR 3.2 today. Continue to hold coumadin and recheck INR daily until INR <3.0. Next INR recheck 07/22/14. Will monitor for signs of bleeding and continue to monitor his status.   Family/Staff Communication Plan of care discussed with resident and nursing staff. Resident and nursing staff verbalized understanding and agree with plan of care.  No additional questions or concerns reported.    Arthur Holms, MSN, AGNP-C [facility] [address] [place], [place] [address] [phone] [8am-5pm] After hours: [phone]

## 2014-07-22 ENCOUNTER — Inpatient Hospital Stay: Payer: Medicare Other | Admitting: Family

## 2014-07-28 ENCOUNTER — Encounter: Payer: Self-pay | Admitting: Registered Nurse

## 2014-07-28 ENCOUNTER — Non-Acute Institutional Stay (SKILLED_NURSING_FACILITY): Payer: Medicare Other | Admitting: Registered Nurse

## 2014-07-28 DIAGNOSIS — I480 Paroxysmal atrial fibrillation: Secondary | ICD-10-CM

## 2014-07-28 DIAGNOSIS — R791 Abnormal coagulation profile: Secondary | ICD-10-CM | POA: Diagnosis not present

## 2014-07-28 LAB — POCT INR: INR: 1.9 — AB (ref <=1.1)

## 2014-07-28 NOTE — Progress Notes (Signed)
Patient ID: Edward [name], male   DOB: [birth year], 81 y.o.   MRN: [id number]   Place of Service: [facility]  Allergies  Allergen Reactions  . Ace Inhibitors Cough  . Clarithromycin Other (See Comments)    Strange thoughts and fell with biaxin  . Codeine Nausea And Vomiting  . Oxycodone-Acetaminophen Other (See Comments)    Felt closed in    Code Status: Full Code  Goals of Care: Longevity/STR  Chief Complaint  Patient presents with  . Acute Visit    subtherapeutic INR    HPI  81 y.o. male with PMH of COPD, PVD, depression, CAD, HTN among others is being seen for management of warfarin therapy. Patient is on long-term warfarin anticoagulation for paroxysmal atrial fib. Goal INR is 2 to 3. Today INR is 1.9 and current warfarin dose is 1mg  daily. No missed or extra dose reported since coumadin was restarted. No recent changes in medications or diet reported. No upcoming procedure. Seen in therapy today. Denies any concerns.    Review of Systems Constitutional: Negative for fever and chills. Cardiovascular: Negative for chest pain and palpitation.  Respiratory: Negative cough and wheezing. Negative for shortness of breath.  Gastrointestinal: Negative for nausea and vomiting. Negative for abdominal pain. Negative for bloody stool.   Genitourinary: Negative for hematuria.  Musculoskeletal: Negative for back pain, joint pain, and joint swelling  Neurological: Negative for dizziness and headache Skin: Negative for rash and wound.   Psychiatric: Negative for depression  Past Medical History  Diagnosis Date  . Gout     "only once in my lifetime" (12/18/2012)  . Depression   . PVD (peripheral vascular disease)     s/p B CEA  . COPD (chronic obstructive pulmonary disease)      PFT 6.19.07: FEV1 465  ratio 60 with 25% response to B2.  > PFTs 8.29.07: FEV1 61%  ratio 46% no better after B2.  > add on advair 12.20.11-improved 1.31.12  . AAA (abdominal aortic aneurysm)     . Melanosis coli   . Internal hemorrhoids   . Carotid artery occlusion   . Aortic valve disorder   . Chronic diastolic heart failure 32/01/9241  . CAD (coronary artery disease), native coronary artery     CABG w LIMA to LAD, SVG to dx, OM, RCA 1989 Dr. Arlyce Dice for 3VD PTCA of OM, 1999 and 2000 Cath showed occlusion of left main and RCA with stenosis in OM Redo redo CABG w SVG to OM, SVG to RCA8/10/00 Dr. Cyndia Bent   . Hyperlipidemia   . BPH (benign prostatic hypertrophy)   . Hypertensive heart disease     Change toprol to bisoprolol 10 mg daily on trial basis  05/21/2012  - [facility] non [facility] 07/17/2012    . LBBB (left bundle branch block)   . Second degree heart block 12/18/2012    s/p MDT Adapta L pacemaker 12-20-2012 by Dr Caryl Comes  . Shingles years ago    mild  . Abrasion of left forearm dec 2015    small scraped area healing  . Chronic kidney disease stage III (GFR 30-59 ml/min)     saw dr Justin Mend oct 2015 and released by dr webb  . Congenital absence of kidney     "noted during AAA repair; never knew it before" (12/18/2012)  . Cancer     bladder  . Myocardial infarction 1979    Past Surgical History  Procedure Laterality Date  . Cholecystectomy  1998  .  Hemiarthroplasty shoulder fracture Right ~ 2008     x 2 - [facility]  . Carotid endarterectomy Bilateral 1990's  . Abdominal aortic aneurysm repair  1998  . Carotid-subclavian bypass graft Left 1990's  . Anal fissure repair      12/18/2012 "I don't remember this"  . Hemorroidectomy      12/18/2012 "I don't remember this"  . Carpal tunnel release Right 1980's    "Dr. Marrian Salvage" (12/18/2012)  . Cardiac catheterization      "2 or 3" (12/18/2012)  . Coronary angioplasty with stent placement      "I've got 1 or 2" (12/18/2012)  . Hip fracture surgery  1944    "fell out of a tree; had it operated on 3 times" (12/18/2012)  . Refractive surgery Bilateral 1990's?  . Pacemaker insertion  12-20-2012    dual chamber Medtronic Adapta  L pacemaker implanted by Dr Caryl Comes  . Cardioversion N/A 12/30/2013    Procedure: CARDIOVERSION;  Surgeon: Jacolyn Reedy, MD;  Location: [place];  Service: Cardiovascular;  Laterality: N/A;  . Permanent pacemaker insertion N/A 12/20/2012    Procedure: PERMANENT PACEMAKER INSERTION;  Surgeon: Deboraha Sprang, MD;  Location: [facility] C[facility];  Service: Cardiovascular;  Laterality: N/A;  . Coronary artery bypass graft  1989, 2000     "CABG X ?3; CABG X 5 w//Bartle" (12/18/2012)  . Transurethral resection of bladder tumor N/A 06/16/2014    Procedure: TRANSURETHRAL RESECTION OF BLADDER TUMOR (TURBT) ;  Surgeon: Jorja Loa, MD;  Location: [facility];  Service: Urology;  Laterality: N/A;    History   Social History  . Marital Status: Widowed    Spouse Name: N/A  . Number of Children: N/A  . Years of Education: N/A   Occupational History  . retired from [employer]  . Smoking status: Former Smoker -- 1.50 packs/day for 50 years    Types: Cigarettes    Quit date: 05/30/1996  . Smokeless tobacco: Never Used  . Alcohol Use: 1.8 oz/week    3 Cans of beer per week     Comment: beer few times per week  . Drug Use: No  . Sexual Activity: No   Other Topics Concern  . Not on file   Social History Narrative   Lives alone. Widower.    Family History  Problem Relation Age of Onset  . Heart disease Mother   . Colon cancer Neg Hx       Medication List       This list is accurate as of: 07/28/14 11:20 AM.  Always use your most recent med list.               acetaminophen 325 MG tablet  Commonly known as:  TYLENOL  Take 2 tablets (650 mg total) by mouth every 6 (six) hours as needed for fever.     amiodarone 200 MG tablet  Commonly known as:  PACERONE  Take 200 mg by mouth daily.     amoxicillin-clavulanate 500-125 MG per tablet  Commonly known as:  AUGMENTIN  Take 1 tablet (500 mg total) by mouth 3 (three) times daily.     bisacodyl 10 MG  suppository  Commonly known as:  DULCOLAX  Place 10 mg rectally daily as needed for moderate constipation or severe constipation.     budesonide 0.25 MG/2ML nebulizer solution  Commonly known as:  PULMICORT  Take 2 mLs (0.25 mg total) by nebulization 2 (two)  times daily.     colchicine 0.6 MG tablet  Take 0.6 mg by mouth daily as needed (gout flare ups).     furosemide 20 MG tablet  Commonly known as:  LASIX  Take 1 tablet (20 mg total) by mouth daily.     guaiFENesin 600 MG 12 hr tablet  Commonly known as:  MUCINEX  Take 600 mg by mouth 2 (two) times daily.     HYDROcodone-acetaminophen 5-325 MG per tablet  Commonly known as:  NORCO  Take 1-2 tablets by mouth every 6 (six) hours as needed for moderate pain.     levalbuterol 0.63 MG/3ML nebulizer solution  Commonly known as:  XOPENEX  Take 3 mLs (0.63 mg total) by nebulization every 8 (eight) hours as needed for wheezing or shortness of breath.     mometasone-formoterol 200-5 MCG/ACT Aero  Commonly known as:  DULERA  Inhale 2 puffs into the lungs every 12 (twelve) hours.     nitroGLYCERIN 0.4 MG SL tablet  Commonly known as:  NITROSTAT  Place 0.4 mg under the tongue every 5 (five) minutes as needed for chest pain.     oxybutynin 5 MG tablet  Commonly known as:  DITROPAN  Take 5 mg by mouth every 8 (eight) hours as needed for bladder spasms (bladder spasms).     polyethylene glycol packet  Commonly known as:  MIRALAX / GLYCOLAX  Take 17 g by mouth 2 (two) times daily.     rosuvastatin 20 MG tablet  Commonly known as:  CRESTOR  Take 20 mg by mouth daily.     sennosides-docusate sodium 8.6-50 MG tablet  Commonly known as:  SENOKOT-S  Take 2 tablets by mouth daily.     tamsulosin 0.4 MG Caps capsule  Commonly known as:  FLOMAX  Take 1 capsule (0.4 mg total) by mouth at bedtime.     temazepam 15 MG capsule  Commonly known as:  RESTORIL  Take 1 capsule (15 mg total) by mouth at bedtime.     tiotropium 18 MCG  inhalation capsule  Commonly known as:  SPIRIVA  Place 1 capsule (18 mcg total) into inhaler and inhale daily.     warfarin 1 MG tablet  Commonly known as:  COUMADIN  Take 1 tablet (1 mg total) by mouth daily. Continue taking Coumadin and have PT/INR checked 06/30/2014 to have the dose adjusted appropriately        Physical Exam  BP 126/71 mmHg  Pulse 75  Temp(Src) 97.9 F (36.6 C)  Resp 18  Ht 5\' 11"  (1.803 m)  Wt 152 lb 12.8 oz (69.31 kg)  BMI 21.32 kg/m2  SpO2 95%  Constitutional: WDWN elderly male in no acute distress. Conversant and pleasant HEENT: Normocephalic and atraumatic. PERRL. EOM intact. No scleral icterus. Oral mucosa moist. Posterior pharynx clear of any exudate or lesions.  Neck: Supple and nontender. No lymphadenopathy, masses, or thyromegaly. No JVD or carotid bruits. Cardiac: Normal S1, S2. RRR without appreciable murmurs, rubs, or gallops. Distal pulses intact. Trace pitting edema of BLE Lungs: No respiratory distress. Breath sounds clear bilaterally without rales, rhonchi, or wheezes.  Abdomen: Audible bowel sounds in all quadrants. Soft, nontender, nondistended. No palpable mass.  Musculoskeletal: Able to move all extremities with generalized weakness. No joint erythema or tenderness.  Skin: Warm and dry. No rash noted. No erythema.  Neurological: Alert and oriented to person, place, and time.  Psychiatric: Judgment and insight adequate. Appropriate mood and affect.   Labs Reviewed  Lab Results  Component Value Date   INR 1.9* 07/28/2014   INR 3.2* 07/21/2014   INR 3.6* 07/07/2014     Assessment & Plan 1. Subtherapeutic INR 2. Paroxysmal atrial fibrillation INR 1.9 today. Continue coumadin 1mg  daily for now. Next INR recheck 07/30/14. Continue to monitor for his status.   Family/Staff Communication Plan of care discussed with resident and nursing staff. Resident and nursing staff verbalized understanding and agree with plan of care. No additional  questions or concerns reported.    Arthur Holms, MSN, AGNP-C [facility] [address] [place], [place] [address] [phone] [8am-5pm] After hours: [phone]

## 2014-07-29 IMAGING — CR DG CHEST 2V
2 series · 2 of 2 positions shown · non-contrast
Comparison: Chest x-ray of 09/20/2010

CLINICAL DATA: Shortness of breath, former smoker

CHEST - 2 VIEW

[w chest pa]
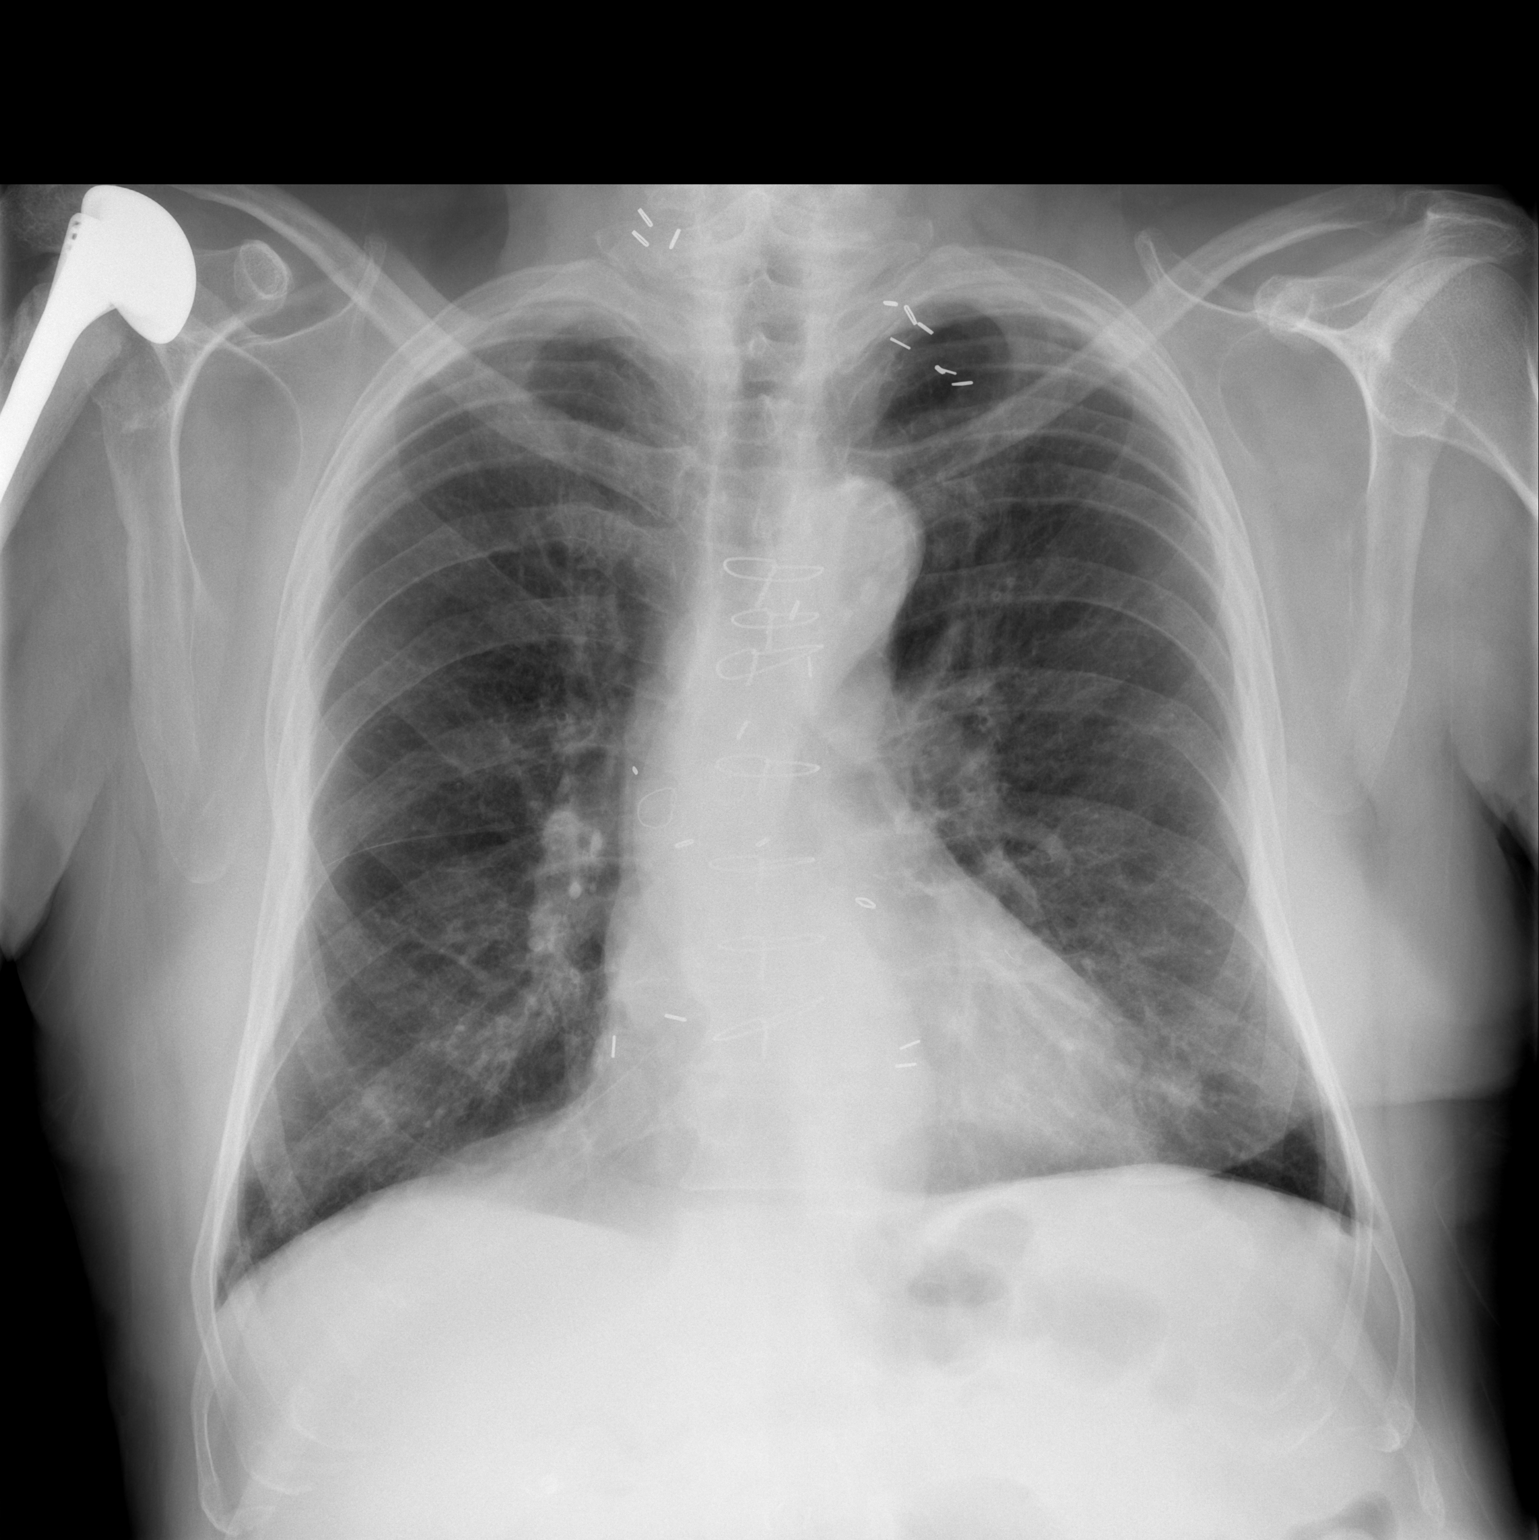

[w chest lat]
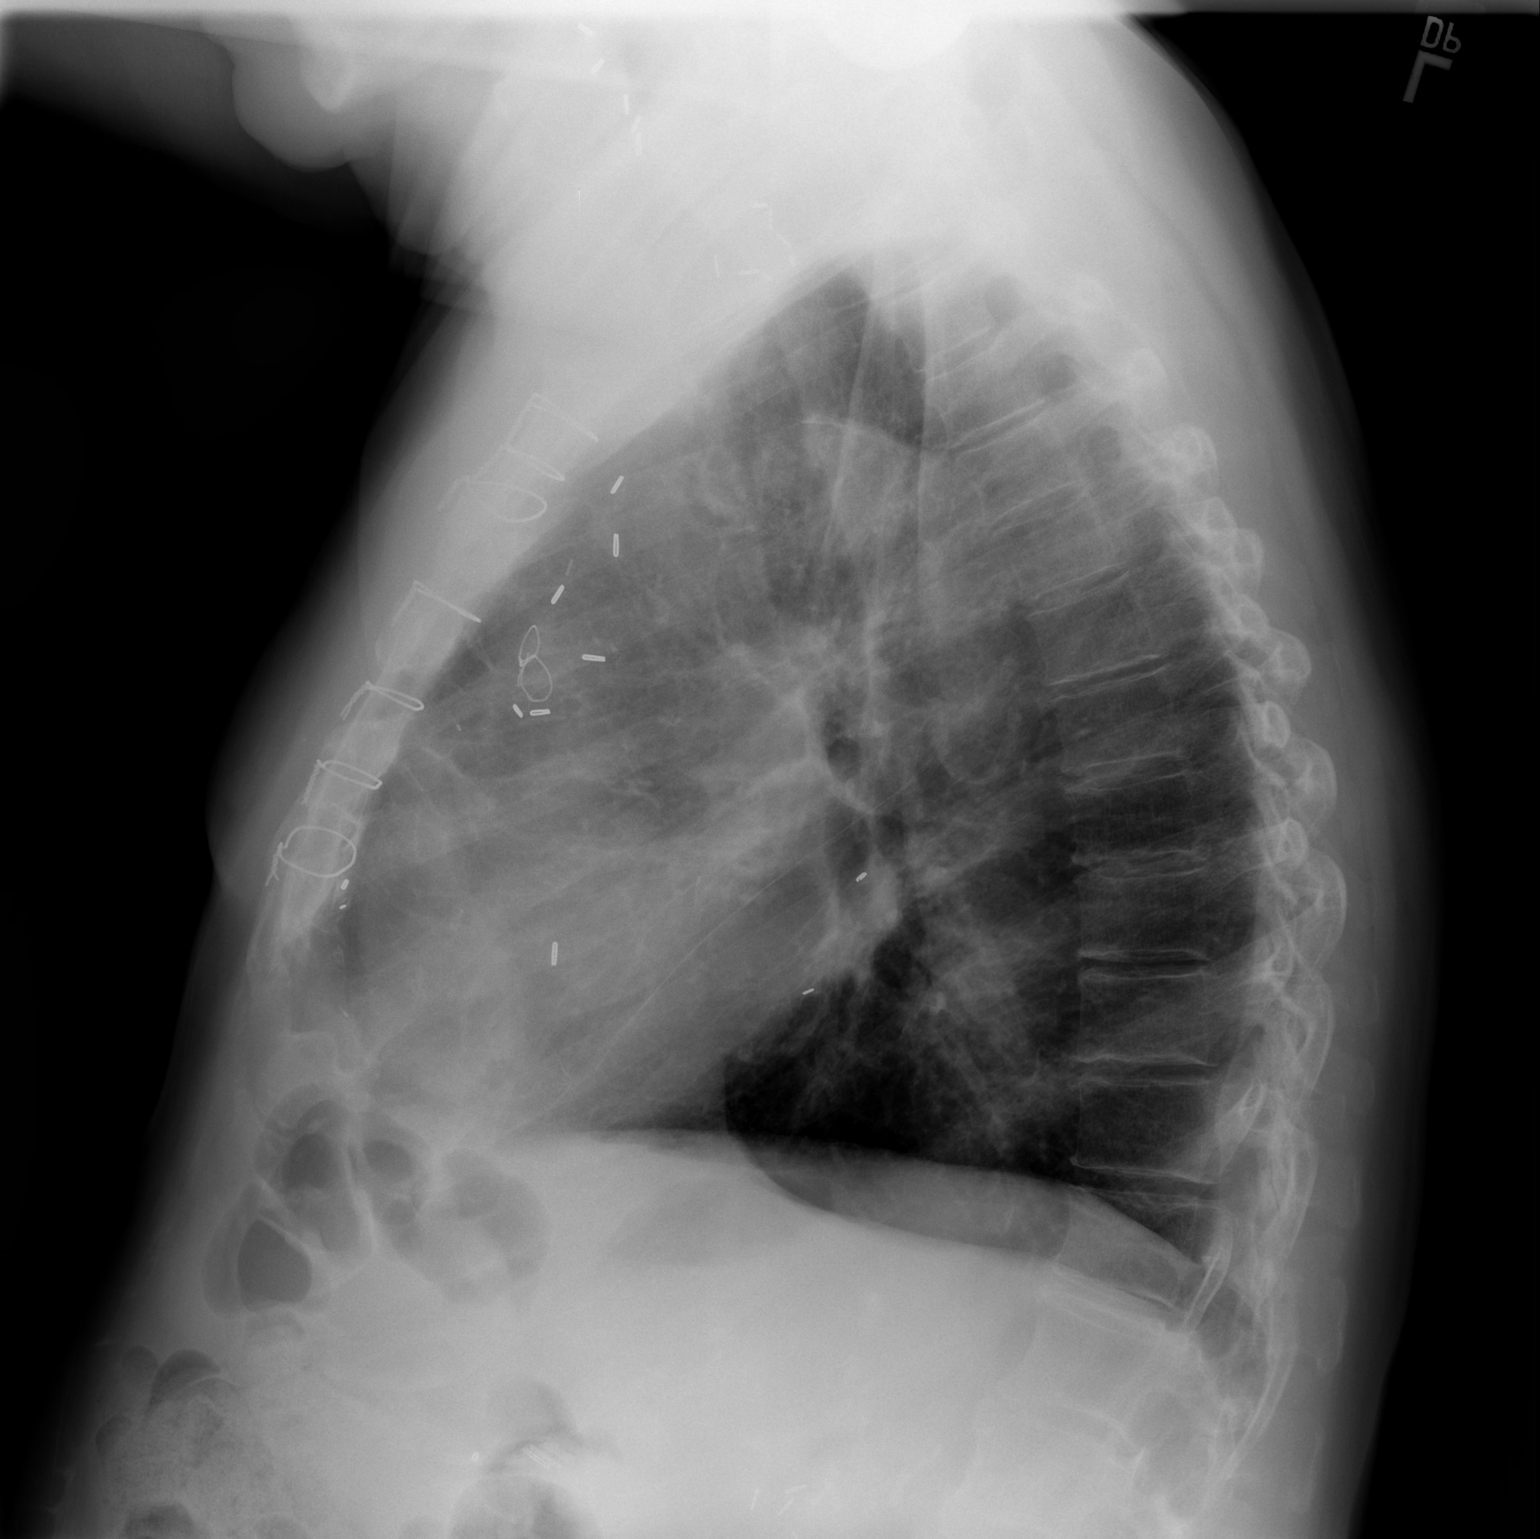

[2 of 2 positions shown; findings below may reference images not displayed]

FINDINGS: No active infiltrate or effusion is seen.  Cardiomegaly
is stable.  The descending thoracic aorta is ectatic and stable.
Surgical clips are noted from prior median sternotomy and a right
humeral head prosthesis remains.
IMPRESSION: Stable chest x-ray.  No active lung disease.  Stable mild
cardiomegaly.

## 2014-07-30 ENCOUNTER — Other Ambulatory Visit: Payer: Self-pay

## 2014-07-30 ENCOUNTER — Encounter: Payer: Self-pay | Admitting: Registered Nurse

## 2014-07-30 ENCOUNTER — Non-Acute Institutional Stay (SKILLED_NURSING_FACILITY): Payer: Medicare Other | Admitting: Registered Nurse

## 2014-07-30 DIAGNOSIS — R635 Abnormal weight gain: Secondary | ICD-10-CM

## 2014-07-30 LAB — POCT INR: INR: 2.3 — AB (ref 0.9–1.1)

## 2014-07-30 MED ORDER — HYDROCODONE-ACETAMINOPHEN 5-325 MG PO TABS: 1.0000 | ORAL_TABLET | Freq: Four times a day (QID) | ORAL | Status: DC | PRN

## 2014-07-30 NOTE — Telephone Encounter (Signed)
Rx faxed to [facility] @ [phone], phone number [phone]  

## 2014-07-30 NOTE — Progress Notes (Signed)
Patient ID: Edward [name], male   DOB: [birth year], 81 y.o.   MRN: [id number]   Place of Service: [facility]  Allergies  Allergen Reactions  . Ace Inhibitors Cough  . Clarithromycin Other (See Comments)    Strange thoughts and fell with biaxin  . Codeine Nausea And Vomiting  . Oxycodone-Acetaminophen Other (See Comments)    Felt closed in    Code Status: Full Code  Goals of Care: Longevity/STR  Chief Complaint  Patient presents with  . Acute Visit    4 lbs weight gain over 24 hrs period    HPI  81 y.o. male with PMH of COPD, PVD, depression, CAD, HTN, CHF among others is being seen for an acute visit for 4 lbs weight gain over 24 hours period. Seen in room today. Denies any concerns.   Review of Systems Constitutional: Negative for fever and chills. Cardiovascular: Negative for chest pain and palpitation.  Respiratory: Negative cough and wheezing. Negative for shortness of breath.  Gastrointestinal: Negative for nausea and vomiting. Negative for abdominal pain.  Genitourinary: Negative for dysuria Cardiac: Negative for chest pain, orthopnea, or PND. Positive for leg swelling Musculoskeletal: Negative for back pain, joint pain, and joint swelling  Neurological: Negative for dizziness and headache Skin: Negative for rash and wound.   Psychiatric: Negative for depression  Past Medical History  Diagnosis Date  . Gout     "only once in my lifetime" (12/18/2012)  . Depression   . PVD (peripheral vascular disease)     s/p B CEA  . COPD (chronic obstructive pulmonary disease)      PFT 6.19.07: FEV1 465  ratio 60 with 25% response to B2.  > PFTs 8.29.07: FEV1 61%  ratio 46% no better after B2.  > add on advair 12.20.11-improved 1.31.12  . AAA (abdominal aortic aneurysm)   . Melanosis coli   . Internal hemorrhoids   . Carotid artery occlusion   . Aortic valve disorder   . Chronic diastolic heart failure 73/09/3297  . CAD (coronary artery disease), native  coronary artery     CABG w LIMA to LAD, SVG to dx, OM, RCA 1989 Dr. Arlyce Dice for 3VD PTCA of OM, 1999 and 2000 Cath showed occlusion of left main and RCA with stenosis in OM Redo redo CABG w SVG to OM, SVG to RCA8/10/00 Dr. Cyndia Bent   . Hyperlipidemia   . BPH (benign prostatic hypertrophy)   . Hypertensive heart disease     Change toprol to bisoprolol 10 mg daily on trial basis  05/21/2012  - [facility] non adherent 07/17/2012    . LBBB (left bundle branch block)   . Second degree heart block 12/18/2012    s/p MDT Adapta L pacemaker 12-20-2012 by Dr Caryl Comes  . Shingles years ago    mild  . Abrasion of left forearm dec 2015    small scraped area healing  . Chronic kidney disease stage III (GFR 30-59 ml/min)     saw dr Justin Mend oct 2015 and released by dr webb  . Congenital absence of kidney     "noted during AAA repair; never knew it before" (12/18/2012)  . Cancer     bladder  . Myocardial infarction 1979    Past Surgical History  Procedure Laterality Date  . Cholecystectomy  1998  . Hemiarthroplasty shoulder fracture Right ~ 2008     x 2 - [facility]  . Carotid endarterectomy Bilateral 1990's  . Abdominal aortic aneurysm repair  [place] bypass graft Left 1990's  . Anal fissure repair      12/18/2012 "I don't remember this"  . Hemorroidectomy      12/18/2012 "I don't remember this"  . Carpal tunnel release Right 1980's    "Dr. Marrian Salvage" (12/18/2012)  . Cardiac catheterization      "2 or 3" (12/18/2012)  . Coronary angioplasty with stent placement      "I've got 1 or 2" (12/18/2012)  . Hip fracture surgery  1944    "fell out of a tree; had it operated on 3 times" (12/18/2012)  . Refractive surgery Bilateral 1990's?  . Pacemaker insertion  12-20-2012    dual chamber Medtronic Adapta L pacemaker implanted by Dr Caryl Comes  . Cardioversion N/A 12/30/2013    Procedure: CARDIOVERSION;  Surgeon: Jacolyn Reedy, MD;  Location: [place];  Service: Cardiovascular;  Laterality:  N/A;  . Permanent pacemaker insertion N/A 12/20/2012    Procedure: PERMANENT PACEMAKER INSERTION;  Surgeon: Deboraha Sprang, MD;  Location: [facility] CATH LAB;  Service: Cardiovascular;  Laterality: N/A;  . Coronary artery bypass graft  1989, 2000     "CABG X ?3; CABG X 5 w//Bartle" (12/18/2012)  . Transurethral resection of bladder tumor N/A 06/16/2014    Procedure: TRANSURETHRAL RESECTION OF BLADDER TUMOR (TURBT) ;  Surgeon: Jorja Loa, MD;  Location: [facility];  Service: Urology;  Laterality: N/A;    History   Social History  . Marital Status: Widowed    Spouse Name: N/A  . Number of Children: N/A  . Years of Education: N/A   Occupational History  . retired from [employer]  . Smoking status: Former Smoker -- 1.50 packs/day for 50 years    Types: Cigarettes    Quit date: 05/30/1996  . Smokeless tobacco: Never Used  . Alcohol Use: 1.8 oz/week    3 Cans of beer per week     Comment: beer few times per week  . Drug Use: No  . Sexual Activity: No   Other Topics Concern  . Not on file   Social History Narrative   Lives alone. Widower.    Family History  Problem Relation Age of Onset  . Heart disease Mother   . Colon cancer Neg Hx       Medication List       This list is accurate as of: 07/30/14  2:23 PM.  Always use your most recent med list.               acetaminophen 325 MG tablet  Commonly known as:  TYLENOL  Take 2 tablets (650 mg total) by mouth every 6 (six) hours as needed for fever.     amiodarone 200 MG tablet  Commonly known as:  PACERONE  Take 200 mg by mouth daily.     amoxicillin-clavulanate 500-125 MG per tablet  Commonly known as:  AUGMENTIN  Take 1 tablet (500 mg total) by mouth 3 (three) times daily.     bisacodyl 10 MG suppository  Commonly known as:  DULCOLAX  Place 10 mg rectally daily as needed for moderate constipation or severe constipation.     budesonide 0.25 MG/2ML nebulizer solution  Commonly known  as:  PULMICORT  Take 2 mLs (0.25 mg total) by nebulization 2 (two) times daily.     colchicine 0.6 MG tablet  Take 0.6 mg by mouth daily as needed (gout flare ups).  furosemide 20 MG tablet  Commonly known as:  LASIX  Take 1 tablet (20 mg total) by mouth daily.     guaiFENesin 600 MG 12 hr tablet  Commonly known as:  MUCINEX  Take 600 mg by mouth 2 (two) times daily.     HYDROcodone-acetaminophen 5-325 MG per tablet  Commonly known as:  NORCO  Take 1-2 tablets by mouth every 6 (six) hours as needed for moderate pain. DO NOT EXCEED 4GM OF TYLENOL IN 24 HOURS     levalbuterol 0.63 MG/3ML nebulizer solution  Commonly known as:  XOPENEX  Take 3 mLs (0.63 mg total) by nebulization every 8 (eight) hours as needed for wheezing or shortness of breath.     mometasone-formoterol 200-5 MCG/ACT Aero  Commonly known as:  DULERA  Inhale 2 puffs into the lungs every 12 (twelve) hours.     nitroGLYCERIN 0.4 MG SL tablet  Commonly known as:  NITROSTAT  Place 0.4 mg under the tongue every 5 (five) minutes as needed for chest pain.     oxybutynin 5 MG tablet  Commonly known as:  DITROPAN  Take 5 mg by mouth every 8 (eight) hours as needed for bladder spasms (bladder spasms).     polyethylene glycol packet  Commonly known as:  MIRALAX / GLYCOLAX  Take 17 g by mouth 2 (two) times daily.     rosuvastatin 20 MG tablet  Commonly known as:  CRESTOR  Take 20 mg by mouth daily.     sennosides-docusate sodium 8.6-50 MG tablet  Commonly known as:  SENOKOT-S  Take 2 tablets by mouth daily.     tamsulosin 0.4 MG Caps capsule  Commonly known as:  FLOMAX  Take 1 capsule (0.4 mg total) by mouth at bedtime.     temazepam 15 MG capsule  Commonly known as:  RESTORIL  Take 1 capsule (15 mg total) by mouth at bedtime.     tiotropium 18 MCG inhalation capsule  Commonly known as:  SPIRIVA  Place 1 capsule (18 mcg total) into inhaler and inhale daily.     warfarin 1 MG tablet  Commonly known as:   COUMADIN  Take 1 tablet (1 mg total) by mouth daily. Continue taking Coumadin and have PT/INR checked 06/30/2014 to have the dose adjusted appropriately        Physical Exam  BP 102/68 mmHg  Pulse 79  Temp(Src) 98.3 F (36.8 C)  Resp 20  Ht 5\' 11"  (1.803 m)  Wt 156 lb (70.761 kg)  BMI 21.77 kg/m2  SpO2 96%  Constitutional: Frail elderly male in no acute distress. Conversant and pleasant HEENT: Normocephalic and atraumatic. PERRL. EOM intact. No scleral icterus. Oral mucosa moist. Posterior pharynx clear of any exudate or lesions.  Neck: Supple and nontender. No lymphadenopathy, masses, or thyromegaly. No JVD or carotid bruits. Cardiac: Normal S1, S2. RRR without appreciable murmurs, rubs, or gallops. Distal pulses intact. 2+ pitting edema of BLE Lungs: No respiratory distress. Breath sounds clear bilaterally without rales, rhonchi, or wheezes.  Abdomen: Audible bowel sounds in all quadrants. Soft, nontender, nondistended. No palpable mass.  Musculoskeletal: Able to move all extremities with generalized weakness. No joint erythema or tenderness.  Skin: Warm and dry. No rash noted. No erythema.  Neurological: Alert and oriented to person, place, and time.  Psychiatric: Judgment and insight adequate. Appropriate mood and affect.   Labs Reviewed CBC Latest Ref Rng 06/28/2014 06/27/2014 06/26/2014  WBC 4.0 - 10.5 K/uL 9.9 9.8 9.5  Hemoglobin 13.0 -  17.0 g/dL 9.4(L) 9.9(L) 9.7(L)  Hematocrit 39.0 - 52.0 % 29.9(L) 31.0(L) 30.4(L)  Platelets 150 - 400 K/uL 270 281 296    CMP Latest Ref Rng 06/27/2014 06/26/2014 06/25/2014  Glucose 70 - 99 mg/dL 110(H) 91 110(H)  BUN 6 - 23 mg/dL 26(H) 30(H) 36(H)  Creatinine 0.50 - 1.35 mg/dL 1.46(H) 1.55(H) 1.68(H)  Sodium 135 - 145 mmol/L 140 140 136  Potassium 3.5 - 5.1 mmol/L 3.8 3.2(L) 3.6  Chloride 96 - 112 mmol/L 99 99 104  CO2 19 - 32 mmol/L 34(H) 32 26  Calcium 8.4 - 10.5 mg/dL 8.6 8.5 8.3(L)  Total Protein 6.0 - 8.3 g/dL - - -  Total  Bilirubin 0.3 - 1.2 mg/dL - - -  Alkaline Phos 39 - 117 U/L - - -  AST 0 - 37 U/L - - -  ALT 0 - 53 U/L - - -   Assessment & Plan 1. Weight gain BLE edema worse than two days ago. Will increase lasix to from 20mg  daily to 40mg  daily x 3 days. Reassess and continue to monitor.   Family/Staff Communication Plan of care discussed with patient and nursing staff. Patient and nursing staff verbalized understanding and agree with plan of care. No additional questions or concerns reported.    Arthur Holms, MSN, AGNP-C [facility] [address] [place], [place] [address] [phone] [8am-5pm] After hours: [phone]

## 2014-08-01 ENCOUNTER — Encounter: Payer: Self-pay | Admitting: Registered Nurse

## 2014-08-01 ENCOUNTER — Non-Acute Institutional Stay (SKILLED_NURSING_FACILITY): Payer: Medicare Other | Admitting: Registered Nurse

## 2014-08-01 DIAGNOSIS — D62 Acute posthemorrhagic anemia: Secondary | ICD-10-CM | POA: Diagnosis not present

## 2014-08-01 DIAGNOSIS — C679 Malignant neoplasm of bladder, unspecified: Secondary | ICD-10-CM | POA: Diagnosis not present

## 2014-08-01 DIAGNOSIS — E785 Hyperlipidemia, unspecified: Secondary | ICD-10-CM

## 2014-08-01 DIAGNOSIS — E46 Unspecified protein-calorie malnutrition: Secondary | ICD-10-CM | POA: Diagnosis not present

## 2014-08-01 DIAGNOSIS — I480 Paroxysmal atrial fibrillation: Secondary | ICD-10-CM | POA: Diagnosis not present

## 2014-08-01 DIAGNOSIS — I5042 Chronic combined systolic (congestive) and diastolic (congestive) heart failure: Secondary | ICD-10-CM | POA: Diagnosis not present

## 2014-08-01 DIAGNOSIS — J449 Chronic obstructive pulmonary disease, unspecified: Secondary | ICD-10-CM

## 2014-08-01 DIAGNOSIS — G4700 Insomnia, unspecified: Secondary | ICD-10-CM

## 2014-08-01 DIAGNOSIS — R5381 Other malaise: Secondary | ICD-10-CM

## 2014-08-01 DIAGNOSIS — K5900 Constipation, unspecified: Secondary | ICD-10-CM | POA: Diagnosis not present

## 2014-08-01 NOTE — Progress Notes (Signed)
Patient ID: Edward [name], male   DOB: 1930/03/10, 81 y.o.   MRN: [id number]   Place of Service: [facility]  Allergies  Allergen Reactions  . Ace Inhibitors Cough  . Clarithromycin Other (See Comments)    Strange thoughts and fell with biaxin  . Codeine Nausea And Vomiting  . Oxycodone-Acetaminophen Other (See Comments)    Felt closed in    Code Status: Full Code  Goals of Care: Longevity/STR  Chief Complaint  Patient presents with  . Discharge Note    HPI 81 y.o. male with PMH of COPD, CAD s/p CABG x5, HLD, paroxysmal afib on coumadin, s/p pacemaker on coumadin, CHF, bladder wall cancer s/p TURBT among many others is being seen for a discharge visit. He was here for short-term rehabilitation post hospital admission from 06/16/14 to 06/28/14 for acute exacerbation of CHF with EF 40-45%, HCAP, and acute blood loss anemia from GU bleed. He has worked well with therapy team and is ready to be discharged home with [facility] PT/OT/RN. Seen in room today. Denies any concerns   Review of Systems Constitutional: Negative for fever, chills, and fatigue. HENT: Negative for ear pain, congestion, and sore throat Eyes: Negative for eye pain, eye discharge, and visual disturbance  Cardiovascular: Negative for chest pain and palpitations. Positive for leg swelling Respiratory: Negative cough and wheezing. Positive for exertional dyspnea.  Gastrointestinal: Negative for nausea and vomiting. Negative for abdominal pain, constipation, and diarrhea.  Genitourinary: Negative for dysuria and hematuria Musculoskeletal: Negative for back pain and joint pain Neurological: Negative for dizziness and headache. Positive for weakness Skin: Negative for rash and wound.   Psychiatric: Negative for depression   Past Medical History  Diagnosis Date  . Gout     "only once in my lifetime" (12/18/2012)  . Depression   . PVD (peripheral vascular disease)     s/p B CEA  . COPD (chronic obstructive  pulmonary disease)      PFT 6.19.07: FEV1 465  ratio 60 with 25% response to B2.  > PFTs 8.29.07: FEV1 61%  ratio 46% no better after B2.  > add on advair 12.20.11-improved 1.31.12  . AAA (abdominal aortic aneurysm)   . Melanosis coli   . Internal hemorrhoids   . Carotid artery occlusion   . Aortic valve disorder   . Chronic diastolic heart failure 47/08/2593  . CAD (coronary artery disease), native coronary artery     CABG w LIMA to LAD, SVG to dx, OM, RCA 1989 Dr. Arlyce Dice for 3VD PTCA of OM, 1999 and 2000 Cath showed occlusion of left main and RCA with stenosis in OM Redo redo CABG w SVG to OM, SVG to RCA8/10/00 Dr. Cyndia Bent   . Hyperlipidemia   . BPH (benign prostatic hypertrophy)   . Hypertensive heart disease     Change toprol to bisoprolol 10 mg daily on trial basis  05/21/2012  - [facility] non adherent 07/17/2012    . LBBB (left bundle branch block)   . Second degree heart block 12/18/2012    s/p MDT Adapta L pacemaker 12-20-2012 by Dr Caryl Comes  . Shingles years ago    mild  . Abrasion of left forearm dec 2015    small scraped area healing  . Chronic kidney disease stage III (GFR 30-59 ml/min)     saw dr Justin Mend oct 2015 and released by dr webb  . Congenital absence of kidney     "noted during AAA repair; never knew it before" (  12/18/2012)  . Cancer     bladder  . Myocardial infarction 1979    Past Surgical History  Procedure Laterality Date  . Cholecystectomy  1998  . Hemiarthroplasty shoulder fracture Right ~ 2008     x 2 - [facility]y  . Carotid endarterectomy Bilateral 1990's  . Abdominal aortic aneurysm repair  1998  . Carotid-subclavian bypass graft Left 1990's  . Anal fissure repair      12/18/2012 "I don't remember this"  . Hemorroidectomy      12/18/2012 "I don't remember this"  . Carpal tunnel release Right 1980's    "Dr. Marrian Salvage" (12/18/2012)  . Cardiac catheterization      "2 or 3" (12/18/2012)  . Coronary angioplasty with stent placement      "I've got 1 or  2" (12/18/2012)  . Hip fracture surgery  1944    "fell out of a tree; had it operated on 3 times" (12/18/2012)  . Refractive surgery Bilateral 1990's?  . Pacemaker insertion  12-20-2012    dual chamber Medtronic Adapta L pacemaker implanted by Dr Caryl Comes  . Cardioversion N/A 12/30/2013    Procedure: CARDIOVERSION;  Surgeon: Jacolyn Reedy, MD;  Location: [place];  Service: Cardiovascular;  Laterality: N/A;  . Permanent pacemaker insertion N/A 12/20/2012    Procedure: PERMANENT PACEMAKER INSERTION;  Surgeon: Deboraha Sprang, MD;  Location: [facility] CATH LAB;  Service: Cardiovascular;  Laterality: N/A;  . Coronary artery bypass graft  1989, 2000     "CABG X ?3; CABG X 5 w//Bartle" (12/18/2012)  . Transurethral resection of bladder tumor N/A 06/16/2014    Procedure: TRANSURETHRAL RESECTION OF BLADDER TUMOR (TURBT) ;  Surgeon: Jorja Loa, MD;  Location: [facility];  Service: Urology;  Laterality: N/A;    History  Substance Use Topics  . Smoking status: Former Smoker -- 1.50 packs/day for 50 years    Types: Cigarettes    Quit date: 05/30/1996  . Smokeless tobacco: Never Used  . Alcohol Use: 1.8 oz/week    3 Cans of beer per week     Comment: beer few times per week    Family History  Problem Relation Age of Onset  . Heart disease Mother   . Colon cancer Neg Hx       Medication List       This list is accurate as of: 08/01/14  3:49 PM.  Always use your most recent med list.               acetaminophen 325 MG tablet  Commonly known as:  TYLENOL  Take 2 tablets (650 mg total) by mouth every 6 (six) hours as needed for fever.     amiodarone 200 MG tablet  Commonly known as:  PACERONE  Take 200 mg by mouth daily.     bisacodyl 10 MG suppository  Commonly known as:  DULCOLAX  Place 10 mg rectally daily as needed for moderate constipation or severe constipation.     budesonide 0.25 MG/2ML nebulizer solution  Commonly known as:  PULMICORT  Take 2 mLs (0.25 mg total) by  nebulization 2 (two) times daily.     colchicine 0.6 MG tablet  Take 0.6 mg by mouth daily as needed (gout flare ups).     furosemide 20 MG tablet  Commonly known as:  LASIX  Take 1 tablet (20 mg total) by mouth daily.     guaiFENesin 600 MG 12 hr tablet  Commonly known as:  MUCINEX  Take 600  mg by mouth 2 (two) times daily.     HYDROcodone-acetaminophen 5-325 MG per tablet  Commonly known as:  NORCO  Take 1-2 tablets by mouth every 6 (six) hours as needed for moderate pain. DO NOT EXCEED 4GM OF TYLENOL IN 24 HOURS     levalbuterol 0.63 MG/3ML nebulizer solution  Commonly known as:  XOPENEX  Take 3 mLs (0.63 mg total) by nebulization every 8 (eight) hours as needed for wheezing or shortness of breath.     mometasone-formoterol 200-5 MCG/ACT Aero  Commonly known as:  DULERA  Inhale 2 puffs into the lungs every 12 (twelve) hours.     nitroGLYCERIN 0.4 MG SL tablet  Commonly known as:  NITROSTAT  Place 0.4 mg under the tongue every 5 (five) minutes as needed for chest pain.     oxybutynin 5 MG tablet  Commonly known as:  DITROPAN  Take 5 mg by mouth every 8 (eight) hours as needed for bladder spasms (bladder spasms).     polyethylene glycol packet  Commonly known as:  MIRALAX / GLYCOLAX  Take 17 g by mouth 2 (two) times daily.     rosuvastatin 20 MG tablet  Commonly known as:  CRESTOR  Take 20 mg by mouth daily.     sennosides-docusate sodium 8.6-50 MG tablet  Commonly known as:  SENOKOT-S  Take 2 tablets by mouth daily.     tamsulosin 0.4 MG Caps capsule  Commonly known as:  FLOMAX  Take 1 capsule (0.4 mg total) by mouth at bedtime.     temazepam 15 MG capsule  Commonly known as:  RESTORIL  Take 1 capsule (15 mg total) by mouth at bedtime.     tiotropium 18 MCG inhalation capsule  Commonly known as:  SPIRIVA  Place 1 capsule (18 mcg total) into inhaler and inhale daily.     warfarin 1 MG tablet  Commonly known as:  COUMADIN  Take 1 tablet (1 mg total) by mouth  daily. Continue taking Coumadin and have PT/INR checked 06/30/2014 to have the dose adjusted appropriately        Physical Exam  BP 124/70 mmHg  Pulse 68  Temp(Src) 98.3 F (36.8 C)  Resp 20  Ht 5\' 11"  (1.803 m)  Wt 151 lb 12.8 oz (68.856 kg)  BMI 21.18 kg/m2  SpO2 95%  Constitutional: frail elderly male in no acute distress. Conversant and pleasant HEENT: Normocephalic and atraumatic. PERRL. EOM intact. No scleral icterus. Oral mucosa moist. Posterior pharynx clear of any exudate or lesions.   Neck: Supple and nontender. No lymphadenopathy, masses, or thyromegaly. No JVD or carotid bruits. Cardiac: Normal S1, S2. RRR without appreciable murmurs, rubs, or gallops. Distal pulses intact. 1+ pitting edema of BLE Lungs: No respiratory distress. Breath sounds diminished bilaterally without rales, rhonchi, or wheezes.  Abdomen: Audible bowel sounds in all quadrants. Soft, nontender, nondistended.  Musculoskeletal: Able to move all extremities with generalized weakness. No joint erythema or tenderness.  Skin: Warm and dry. No rash noted. No erythema.  Neurological: Alert and oriented to person, place, and time. No focal deficits Psychiatric: Judgment and insight adequate. Appropriate mood and affect.   Labs Reviewed  CBC Latest Ref Rng 07/07/2014 06/28/2014 06/27/2014  WBC - 8.7 9.9 9.8  Hemoglobin 13.5 - 17.5 g/dL 9.0(A) 9.4(L) 9.9(L)  Hematocrit 41 - 53 % 28(A) 29.9(L) 31.0(L)  Platelets 150 - 399 K/L 291 270 281    CMP Latest Ref Rng 07/01/2014 06/27/2014 06/26/2014  Glucose 70 - 99 mg/dL -  110(H) 91  BUN 4 - 21 mg/dL 22(A) 26(H) 30(H)  Creatinine 0.6 - 1.3 mg/dL 1.4(A) 1.46(H) 1.55(H)  Sodium 137 - 147 mmol/L 136(A) 140 140  Potassium 3.4 - 5.3 mmol/L 4.1 3.8 3.2(L)  Chloride 96 - 112 mmol/L - 99 99  CO2 19 - 32 mmol/L - 34(H) 32  Calcium 8.4 - 10.5 mg/dL - 8.6 8.5  Total Protein 6.0 - 8.3 g/dL - - -  Total Bilirubin 0.3 - 1.2 mg/dL - - -  Alkaline Phos 39 - 117 U/L - - -  AST 0  - 37 U/L - - -  ALT 0 - 53 U/L - - -   Lab Results  Component Value Date   INR 2.3* 07/30/2014   INR 1.9* 07/28/2014   INR 3.2* 07/21/2014     Lab Results  Component Value Date   TSH 2.63 10/30/2013    Lipid Panel     Component Value Date/Time   CHOL 122 10/30/2013 1209   TRIG 95.0 10/30/2013 1209   TRIG 296 04/10/2009   HDL 50.30 10/30/2013 1209   CHOLHDL 2 10/30/2013 1209   VLDL 19.0 10/30/2013 1209   LDLCALC 53 10/30/2013 1209    Assessment & Plan 1. Paroxysmal atrial fibrillation Stable. RRR on exam. Continue amiodarone 200mg  daily. INR 2.3 today. Continue coumadin at 1mg  daily. Will discharge home with [facility] RN for INR monitoring-next INR check 08/04/14. Continue to f/u with cardiology.   2. Chronic combined systolic and diastolic CHF (congestive heart failure) Appears euvolemic on exam today. Continue lasix 20mg  daily. Continue daily weight and monitor. Not on ace/arb due to CKD. Currently not on potassium supplement. Last K 4.1 on 07/01/14. F/u with cardiology or pcp.   3. Cancer of bladder wall S/p TURBT. Is being followed by urology.   4. Acute blood loss anemia S/p GU bleed. Denies any hematuria as of late. Recent h&h 9.0/27.6. PCP to monitor h&h  5. HLD (hyperlipidemia) LDL at goal. Continue crestor 20mg  daily   6. Physical deconditioning Continue to work with [facility] PT/OT for gait/strength/balance training to restore/maximize functional capacity. Fall risk precautions.   7. Constipation Stable. Continue miralax daily, senna s 2 tabs daily. and Dulcolax 10mg  suppository PRN. Encourage adequate hydration and ambulation as tolerated.   8. Insomnia Stable. Continue restoril 15mg  daily at bedtime   9. COPD Stable. No recent exacerbation. Continue spiriva 43mcg inhalation daily, pulmicort 0.25/67mL via neb twice daily, and xopenex via neb every 8 hours ad needed for shob and wheezing.   10. Protein-calorie malnutrition Has lost about 10lbs over the past 30 day due to  poor appetite. Recommended dietary supplement (e.g. Ensure). F/u with pcp  Home health services:PT/OT/RN DME required: [facility] PCP follow-up: Dr. Linna Darner on 08/13/14 at 2pm 30-day supply of prescription medications provided (#30 Norco 5/325mg )  Time spent: >35 minutes on care coordination and counseling.  Family/Staff Communication Plan of care discussed with patient and nursing staff. Patient and nursing staff verbalized understanding and agree with plan of care. No additional questions or concerns reported.    Arthur Holms, MSN, AGNP-C [facility] [address] [place], [place] [address] [phone] [8am-5pm] After hours: [phone]

## 2014-08-04 LAB — PROTIME-INR

## 2014-08-08 ENCOUNTER — Encounter: Payer: Self-pay | Admitting: Cardiology

## 2014-08-08 NOTE — Progress Notes (Signed)
Patient ID: Edward [name], male   DOB: Oct 29, 1929, 81 y.o.   MRN: [id number]  Edward [name], Edward [name]  Date of visit:  08/08/2014 DOB:  06/16/1929    Age:  81 yrs. Medical record number:  [id number]     Account number:  [id number] Primary Care Provider: [name]  2. Paroxysmal atrial fibrillation  3. Peripheral vascular disease, unspecified  4. Chronic kidney disease, stage 3 (moderate)  5. Presence of cardiac pacemaker  6. Essential (primary) hypertension  7. Long term (current) use of anticoagulants  8. Chronic obstructive pulmonary disease, unspecified  9. Nonrheumatic aortic (valve) stenosis  10. Left bundle-branch block, unspecified  11. Other hyperlipidemia  12. Malignant Neoplasm Of Bladder, Unspecified  13. Old myocardial infarction  14. Occlusion and stenosis of bilateral carotid arteries  15. Idiopathic Gout, Unspecified Site  16. Dyspnea, unspecified  17. Presence of aortocoronary bypass graft ____________________________ ALLERGIES  Ace Inhibitors, Dry cough  Clarithromycin, Intolerance-unknown  Codeine, Intolerance-unknown ____________________________ MEDICATIONS  1. Spiriva with HandiHaler 18 mcg capsule, w/inhalation device, 1 p.o. daily  2. temazepam 15 mg Capsule, QHS  3. Colcrys 0.6 mg tablet, PRN  4. furosemide 20 mg tablet, PRN swelling  5. nitroglycerin 0.4 mg tablet, sublingual, PRN  6. Dulera 200-5 mcg/actuation HFA aerosol inhaler, 2 puff bid  7. Crestor 20 mg tablet, 1 p.o. daily  8. amiodarone 200 mg tablet, 1 p.o. daily  9. warfarin 1 mg tablet, 1 qd or as directed ____________________________ CHIEF COMPLAINTS  Followup of Atherosclerotic heart disease of native coronary artery without angina pectoris  Followup of Paroxysmal atrial fibrillation ____________________________ HISTORY OF PRESENT ILLNESS Patient seen for  cardiac followup. He was hospitalized recently with bladder cancer and this was resected. Following this he went heart failure probable systolic and diastolic that may have been due to volume overload as well as him going into atrial fibrillation. He also had pneumonia noted. He was severely physically deconditioned and was diuresed and after his urologic status settled down was placed back on anticoagulation. The plan was to consider cardioversion after he had been adequately anticoagulated for 4 weeks. Unfortunately yesterday his INR was subtherapeutic. He is now back, and is still unsteady on his feet but is feeling better. He still has some dyspnea at times. He does have stage III for chronic kidney disease and is taking a higher dose of diuretics. He is not currently having angina. Pacemaker evaluation done showed that he has been in atrial flutter or fibrillation since January 22. He doesn't have PND orthopnea. He has minimal edema. ____________________________ PAST HISTORY  Past Medical Illnesses:  hypertension, hyperlipidemia, peripheral vascular disease, COPD, history of shingles, gout, GERD;  Cardiovascular Illnesses:  diastolic CHF, CAD, S/P MI-inferior, conduction disorder-LBBB, second degree heart block, atrial fibrillation;  Surgical Procedures:  redo CABG w SVG to OM, SVG to RCA8/10/00 Dr. Cyndia Bent, CABG w LIMA to LAD, SVG to dx, OM, RCA 1989 Dr. Arlyce Dice, carotid endarterectomy-bil, cholecystectomy, hemorrhoidectomy, L carotid subclavian bypass, repair of anal fissure, AAA repair, R Shoulder surgery x2;  NYHA Classification:  II;  Canadian Angina Classification:  Class 0: Asymptomatic;  Cardiology Procedures-Invasive:  cardiac cath (left) 2000, cardioversion August 2015;  Cardiology Procedures-Noninvasive:  adenosine cardiolte March 2008, treadmill cardiolite June 2011, echocardiogram June 2014, echocardiogram July 2015;  Cardiac Cath Results:  normal Left main, occluded LAD, occluded CFX, occluded  RCA, occluded Diag 1 SVG, occluded RCA SVG, 99% stenosis  CFX SVG, widely patent LAD LIMA graft;  LVEF of 45% documented via echocardiogram on 06/19/2014,   ____________________________ CARDIO-PULMONARY TEST DATES EKG Date:  01/09/2014;   Cardiac Cath Date:  05/10/2007;  CABG: 01/07/1999;  Nuclear Study Date:  11/26/2009;  Echocardiography Date: 06/19/2014;  Chest Xray Date: 11/18/2013;   ____________________________ FAMILY HISTORY Brother -- Brother dead, Diabetes mellitus Brother -- Brother alive and well Father -- Father dead, Leukemia Mother -- Mother dead, Coronary Artery Disease Sister -- Sister alive and well Sister -- Sister dead Sister -- Sister alive with problem, Cancer Sister -- Sister alive and well Sister -- Sister dead, Cancer Sister -- Coronary Artery Disease ____________________________ SOCIAL HISTORY Alcohol Use:  beer;  Smoking:  used to smoke but quit 1998, greater than 50 pack year history;  Diet:  regular diet without modifications;  Lifestyle:  widower;  Exercise:  exercise is limited due to physical disability;  Occupation:  retired Astronomer;  Residence:  lives with son;   ____________________________ REVIEW OF SYSTEMS General:  malaise and fatigue, weight loss of approximately 30 lbs  Integumentary:no rashes or new skin lesions. Eyes: wears eye glasses/contact lenses, cataracts Respiratory: denies dyspnea, cough, wheezing or hemoptysis. Cardiovascular:  please review HPI Abdominal: denies dyspepsia, GI bleeding, constipation, or diarrhea Genitourinary-Male: see  HPI  Musculoskeletal:  arthritis of the right shoulder Neurological:  denies headaches, stroke, or TIA  ____________________________ PHYSICAL EXAMINATION VITAL SIGNS  Blood Pressure:  114/70 Sitting, Left arm, regular cuff  , 100/70 Standing, Left arm and regular cuff   Pulse:  80/min. Weight:  155.00 lbs. Height:  70"BMI: 22  Constitutional:  pleasant white male in no acute distress Skin:  warm and dry  to touch, no apparent skin lesions, or masses noted. Head:  normocephalic, normal hair pattern, no masses or tenderness ENT:  ears, nose and throat reveal no gross abnormalities.  Dentition good. Neck:  bilateral carotid endarterectomy scars, no JVD, no bruits, no masses, non-tender Chest:  clear to auscultation, healed median sternotomy scar, healed pacemaker incision in the left pectoral area Cardiac:  regular rhythm, normal S1 and S2, no S3 or S4, grade 1/6 systolic murmur at aortic area radiating to neck Peripheral Pulses:  bilateral femoral bruits present, femoral pulses 2+, dorsalis pedis pulses diminished, posterior tibial pulses diminished Extremities & Back:  well healed saphenous vein donor site RLE, well healed saphenous vein donor site LLE, no edema present Neurological:  no gross motor or sensory deficits noted, affect appropriate, oriented x3. ____________________________ MOST RECENT LIPID PANEL 11/18/13  CHOL TOTL 119 mg/dl, LDL 57 NM, HDL 47 mg/dl, TRIGLYCER 74 mg/dl and CHOL/HDL 2.5 (Calc) ____________________________ IMPRESSIONS/PLAN 1. Recurrent atrial flutter and fibrillation which is causing symptomatic diastolic dysfunction 2. Chronic systolic and diastolic heart failure 3. Functioning permanent pacemaker 4. Suboptimal warfarin anticoagulation 5. Coronary artery disease previous bypass grafting and infarct  Recommendations:  INR was subtherapeutic yesterday. I told him when his INR has been therapeutic for 3 or 4 weeks and we will consider cardioversion to see if it would help him symptomatically. His ejection fraction was 40-45% in the hospital previously. Continue his current medicine and see him back in followup in one month.  ____________________________ TODAYS ORDERS  1. Return Visit: 1 month                       ____________________________ Cardiology Physician:  Edward Hough MD [facility]

## 2014-08-11 ENCOUNTER — Telehealth: Payer: Self-pay | Admitting: Internal Medicine

## 2014-08-11 NOTE — Telephone Encounter (Signed)
LVM for pt to call us back

## 2014-08-11 NOTE — Telephone Encounter (Signed)
Pt's son called request Dr. Asa Lente to work Mr. Ozburn in for sleeping problem, pt has not been see for a while since he was in the re hab. Please advise, pt has an appt with Dr. Linna Darner for hospital fu on 08/13/14 but they do not want to see Dr. Linna Darner. Please advise.

## 2014-08-11 NOTE — Telephone Encounter (Signed)
Check to see if pt is available to come in as "add on" at end of schedule tomorrow 3/15 thanks

## 2014-08-12 ENCOUNTER — Ambulatory Visit (INDEPENDENT_AMBULATORY_CARE_PROVIDER_SITE_OTHER): Payer: Medicare Other | Admitting: Internal Medicine

## 2014-08-12 ENCOUNTER — Other Ambulatory Visit (INDEPENDENT_AMBULATORY_CARE_PROVIDER_SITE_OTHER): Payer: Medicare Other

## 2014-08-12 ENCOUNTER — Encounter: Payer: Self-pay | Admitting: Internal Medicine

## 2014-08-12 VITALS — BP 132/70 | HR 75 | Temp 97.6°F | Ht 71.0 in | Wt 159.0 lb

## 2014-08-12 DIAGNOSIS — C679 Malignant neoplasm of bladder, unspecified: Secondary | ICD-10-CM

## 2014-08-12 DIAGNOSIS — I480 Paroxysmal atrial fibrillation: Secondary | ICD-10-CM

## 2014-08-12 DIAGNOSIS — Z23 Encounter for immunization: Secondary | ICD-10-CM | POA: Diagnosis not present

## 2014-08-12 DIAGNOSIS — G4700 Insomnia, unspecified: Secondary | ICD-10-CM | POA: Diagnosis not present

## 2014-08-12 LAB — CBC WITH DIFFERENTIAL/PLATELET
BASOS ABS: 0.1 10*3/uL (ref 0.0–0.1)
Basophils Relative: 0.7 % (ref 0.0–3.0)
EOS ABS: 0.1 10*3/uL (ref 0.0–0.7)
Eosinophils Relative: 1.1 % (ref 0.0–5.0)
HEMATOCRIT: 33 % — AB (ref 39.0–52.0)
Hemoglobin: 10.8 g/dL — ABNORMAL LOW (ref 13.0–17.0)
LYMPHS ABS: 1 10*3/uL (ref 0.7–4.0)
Lymphocytes Relative: 13.4 % (ref 12.0–46.0)
MCHC: 32.7 g/dL (ref 30.0–36.0)
MCV: 85.8 fl (ref 78.0–100.0)
MONOS PCT: 11.5 % (ref 3.0–12.0)
Monocytes Absolute: 0.9 10*3/uL (ref 0.1–1.0)
NEUTROS ABS: 5.7 10*3/uL (ref 1.4–7.7)
Neutrophils Relative %: 73.3 % (ref 43.0–77.0)
PLATELETS: 266 10*3/uL (ref 150.0–400.0)
RBC: 3.85 Mil/uL — ABNORMAL LOW (ref 4.22–5.81)
RDW: 17.4 % — AB (ref 11.5–15.5)
WBC: 7.8 10*3/uL (ref 4.0–10.5)

## 2014-08-12 LAB — BASIC METABOLIC PANEL
BUN: 24 mg/dL — ABNORMAL HIGH (ref 6–23)
CO2: 30 mEq/L (ref 19–32)
Calcium: 9.2 mg/dL (ref 8.4–10.5)
Chloride: 103 mEq/L (ref 96–112)
Creatinine, Ser: 1.55 mg/dL — ABNORMAL HIGH (ref 0.40–1.50)
GFR: 45.74 mL/min — AB (ref >60.00)
GLUCOSE: 111 mg/dL — AB (ref 70–99)
POTASSIUM: 4.3 meq/L (ref 3.5–5.1)
SODIUM: 135 meq/L (ref 135–145)

## 2014-08-12 MED ORDER — DIAZEPAM 5 MG PO TABS: 5.0000 mg | ORAL_TABLET | Freq: Every day | ORAL | Status: DC

## 2014-08-12 MED ORDER — MOMETASONE FURO-FORMOTEROL FUM 200-5 MCG/ACT IN AERO: 2.0000 | INHALATION_SPRAY | Freq: Two times a day (BID) | RESPIRATORY_TRACT | Status: DC

## 2014-08-12 NOTE — Patient Instructions (Signed)
It was good to see you today.  We have reviewed your prior records including labs and tests today  Prevnar 13 pneumonia vaccine updated today  Test(s) ordered today. Your results will be released to Edgar (or called to you) after review, usually within 72hours after test completion. If any changes need to be made, you will be notified at that same time.  Medications reviewed and updated Change temazepam to generic diazepam for sleep at bedtime as discussed Prescription given to you today to take to your pharmacy No other changes recommended at this time. Refill on medication(s) as discussed today.  Continue working with your urologist and cardiologist as ongoing  Please schedule followup in 4-6 months, call sooner if problems.

## 2014-08-12 NOTE — Progress Notes (Signed)
Subjective:    Patient ID: Edward [name], male    DOB: [birth year], 81 y.o.   MRN: [id number]  HPI  Patient here for follow-up. Reviewed chronic conditions, interval events and current concerns namely acute exacerbation of chronic insomnia despite use of temazepam  Past Medical History  Diagnosis Date  . Gout     "only once in my lifetime" (12/18/2012)  . Depression   . PVD (peripheral vascular disease)     s/p B CEA  . COPD (chronic obstructive pulmonary disease)      PFT 6.19.07: FEV1 465  ratio 60 with 25% response to B2.  > PFTs 8.29.07: FEV1 61%  ratio 46% no better after B2.  > add on advair 12.20.11-improved 1.31.12  . AAA (abdominal aortic aneurysm)   . Melanosis coli   . Internal hemorrhoids   . Carotid artery occlusion   . Aortic valve disorder   . Chronic diastolic heart failure 02/01/7095  . CAD (coronary artery disease), native coronary artery     CABG w LIMA to LAD, SVG to dx, OM, RCA 1989 Dr. Arlyce Dice for 3VD PTCA of OM, 1999 and 2000 Cath showed occlusion of left main and RCA with stenosis in OM Redo redo CABG w SVG to OM, SVG to RCA8/10/00 Dr. Cyndia Bent   . Hyperlipidemia   . BPH (benign prostatic hypertrophy)   . Hypertensive heart disease     Change toprol to bisoprolol 10 mg daily on trial basis  05/21/2012  - notified by [facility] non adherent 07/17/2012    . LBBB (left bundle branch block)   . Second degree heart block 12/18/2012    s/p MDT Adapta L pacemaker 12-20-2012 by Dr Caryl Comes  . Shingles years ago    mild  . Abrasion of left forearm dec 2015    small scraped area healing  . Chronic kidney disease stage III (GFR 30-59 ml/min)     saw dr Justin Mend oct 2015 and released by dr webb  . Congenital absence of kidney     "noted during AAA repair; never knew it before" (12/18/2012)  . Cancer 05/2014    bladder, s/p uro surg resection  . Myocardial infarction 1979    Review of Systems  Constitutional: Negative for fatigue and unexpected weight change.  Respiratory:  Negative for cough and shortness of breath.   Cardiovascular: Negative for chest pain, palpitations and leg swelling.  Psychiatric/Behavioral: Positive for sleep disturbance (chronic, "worse than ever" since return home from SNF despite temazepam use).       Objective:    Physical Exam  Constitutional: He appears well-developed and well-nourished. No distress.  Son at side  Cardiovascular: Normal rate, regular rhythm and normal heart sounds.   No murmur heard. Pulmonary/Chest: Effort normal and breath sounds normal. No respiratory distress.  Psychiatric: He has a normal mood and affect. His behavior is normal. Judgment and thought content normal.    BP 132/70 mmHg  Pulse 75  Temp(Src) 97.6 F (36.4 C) (Oral)  Ht 5\' 11"  (1.803 m)  Wt 159 lb (72.122 kg)  BMI 22.19 kg/m2  SpO2 98% Wt Readings from Last 3 Encounters:  08/12/14 159 lb (72.122 kg)  08/01/14 151 lb 12.8 oz (68.856 kg)  07/30/14 156 lb (70.761 kg)     Lab Results  Component Value Date   WBC 8.7 07/07/2014   HGB 9.0* 07/07/2014   HCT 28* 07/07/2014   PLT 291 07/07/2014   GLUCOSE 110* 06/27/2014   CHOL 122 10/30/2013  TRIG 95.0 10/30/2013   HDL 50.30 10/30/2013   LDLCALC 53 10/30/2013   ALT 17 06/18/2014   AST 34 06/18/2014   NA 136* 07/01/2014   K 4.1 07/01/2014   CL 99 06/27/2014   CREATININE 1.4* 07/01/2014   BUN 22* 07/01/2014   CO2 34* 06/27/2014   TSH 2.63 10/30/2013   INR 2.3* 07/30/2014    Ct Abdomen Pelvis W Contrast  06/11/2014   CLINICAL DATA:  Initial encounter for left lower quadrant abdominal pain and bloating since January 7th.  EXAM: CT ABDOMEN AND PELVIS WITH CONTRAST  TECHNIQUE: Multidetector CT imaging of the abdomen and pelvis was performed using the standard protocol following bolus administration of intravenous contrast.  CONTRAST:  110mL OMNIPAQUE IOHEXOL 300 MG/ML  SOLN  COMPARISON:  04/08/2014  FINDINGS: Lower chest: Unremarkable aside from some peripheral chronic interstitial  changes at the bases.  Hepatobiliary: Calcified granuloma seen in the subcapsular posterior right liver. No other focal intraparenchymal abnormality. Gallbladder is surgically absent. No intrahepatic or extrahepatic biliary dilation.  Pancreas: Diffuse pancreatic atrophy without ductal dilatation. No focal mass lesion.  Spleen: No splenomegaly. No focal mass lesion.  Adrenals/Urinary Tract: No adrenal nodule or mass. Left kidney is atrophic with multiple left-sided renal cysts, as before. Cortical atrophy also noted in the right kidney. Small right renal cysts are evident. No enhancing lesion evident within either kidney. No hydroureter. There are amorphous areas of increased attenuation within the inferior bladder which may in part be related to inflow of some partially opacified urine although renal function is clearly impaired in the left kidney. There is an area in the posterior bladder suggestive of a focal urothelial lesion (see image 66 series 2).  Stomach/Bowel: Stomach is nondistended. No gastric wall thickening. No evidence of outlet obstruction. Duodenum is normally positioned as is the ligament of Treitz. No small bowel wall thickening. No small bowel dilatation. Terminal ileum is normal. Appendix is normal. No gross colonic mass. No colonic wall thickening. No substantial diverticular change.  Vascular/Lymphatic: Atherosclerotic calcification is noted in the wall of the abdominal aorta without aneurysm. No gastrohepatic or hepatoduodenal ligament lymphadenopathy. No retroperitoneal lymphadenopathy. No pelvic sidewall lymphadenopathy.  Reproductive: Prostate gland is unremarkable.  Other: No intraperitoneal free fluid.  Musculoskeletal: Compression fractures L3 and L1 are new in the interval.  IMPRESSION: Inferior and posterior bladder wall irregularity with some areas having features suggestive of focal urothelial lesion. Cystoscopy may prove helpful to further evaluate.  Bilateral renal atrophy, left  greater than right, with associated renal cyst bilaterally.  Age indeterminate compression fractures at L1 and L3 are new since 04/08/2014.  Atherosclerosis.   Electronically Signed   By: Misty Stanley M.D.   On: 06/11/2014 14:27       Assessment & Plan:   Problem List Items Addressed This Visit    Atrial fibrillation (Chronic)    New dx on PPM interrogation 2014 Cardiology changed plavix to Eliquis because of same Now on warfarin post op - INR per [facility] monitoring      Relevant Orders   Basic metabolic panel   CBC with Differential/Platelet   Cancer of bladder wall (Chronic)    S/p TU resection 06/14/14 - OP note reviewed Post op course complicated by PNA S/p SNF rehab, home last week OP follow up with uro to continue BCG as planned      Relevant Medications   diazepam (VALIUM) tablet   Other Relevant Orders   Basic metabolic panel   CBC with Differential/Platelet  INSOMNIA, CHRONIC - Primary    Prefers temazepam to other prior meds, but ineffective for past few months since rehab Will change therapy now to diazepam          Gwendolyn Grant, MD

## 2014-08-12 NOTE — Progress Notes (Signed)
Pre visit review using our clinic review tool, if applicable. No additional management support is needed unless otherwise documented below in the visit note. 

## 2014-08-12 NOTE — Telephone Encounter (Signed)
Pt contacted and is coming in today.

## 2014-08-12 NOTE — Assessment & Plan Note (Signed)
New dx on PPM interrogation 2014 Cardiology changed plavix to Eliquis because of same Now on warfarin post op - INR per [facility] monitoring

## 2014-08-12 NOTE — Assessment & Plan Note (Signed)
Prefers temazepam to other prior meds, but ineffective for past few months since rehab Will change therapy now to diazepam

## 2014-08-12 NOTE — Assessment & Plan Note (Signed)
S/p TU resection 06/14/14 - OP note reviewed Post op course complicated by PNA S/p SNF rehab, home last week OP follow up with uro to continue BCG as planned

## 2014-08-13 ENCOUNTER — Ambulatory Visit: Payer: 59 | Admitting: Internal Medicine

## 2014-08-15 ENCOUNTER — Telehealth: Payer: Self-pay | Admitting: Internal Medicine

## 2014-08-15 NOTE — Telephone Encounter (Signed)
Please call to find out what he wants, have not seen patient before.

## 2014-08-15 NOTE — Telephone Encounter (Signed)
Pt informed of MD advisement.

## 2014-08-15 NOTE — Telephone Encounter (Signed)
Pt states that the rx for valium is not helping him to sleep.

## 2014-08-15 NOTE — Telephone Encounter (Signed)
He can try going back on temazepam (just filled 30 on 08/08/14) and add melatonin (over the counter) to help him to sleep better. If he wants he can try diazepam with the melatonin. This should help him to reset his circadian rhythm although it may take 5-7 days to get into his system fully (the melatonin).

## 2014-08-15 NOTE — Telephone Encounter (Signed)
Pt request phone call from the assistant, stated you will know what he is talking about because he is waiting for your phone call.

## 2014-08-28 DIAGNOSIS — J441 Chronic obstructive pulmonary disease with (acute) exacerbation: Secondary | ICD-10-CM | POA: Diagnosis not present

## 2014-09-10 ENCOUNTER — Telehealth: Payer: Self-pay | Admitting: Internal Medicine

## 2014-09-10 DIAGNOSIS — J449 Chronic obstructive pulmonary disease, unspecified: Secondary | ICD-10-CM

## 2014-09-10 NOTE — Telephone Encounter (Signed)
When I saw him previously he wasn't even on saba much less neb saba so ok to get him neb but needs  Ov asap with all meds in hand to regroup

## 2014-09-10 NOTE — Telephone Encounter (Signed)
Edward [name] returning call.Edward [name]

## 2014-09-10 NOTE — Telephone Encounter (Signed)
I called spoke with Tiffany ([facility] nurse). She reports she has tried calling PCP and cards to get pt a neb machine but was told to call us for this. Please advise MW thanks

## 2014-09-10 NOTE — Telephone Encounter (Signed)
lmomtcb x1 for Edward [name] Pt last seen 08/27/13 no pending appt

## 2014-09-11 ENCOUNTER — Ambulatory Visit (INDEPENDENT_AMBULATORY_CARE_PROVIDER_SITE_OTHER): Payer: Medicare Other | Admitting: Internal Medicine

## 2014-09-11 ENCOUNTER — Encounter: Payer: Self-pay | Admitting: Internal Medicine

## 2014-09-11 VITALS — BP 120/80 | HR 77 | Ht 71.0 in | Wt 150.2 lb

## 2014-09-11 DIAGNOSIS — J449 Chronic obstructive pulmonary disease, unspecified: Secondary | ICD-10-CM

## 2014-09-11 MED ORDER — MOMETASONE FURO-FORMOTEROL FUM 100-5 MCG/ACT IN AERO
INHALATION_SPRAY | RESPIRATORY_TRACT | Status: DC
Start: 2014-09-11 — End: 2014-10-14

## 2014-09-11 MED ORDER — TIOTROPIUM BROMIDE MONOHYDRATE 18 MCG IN CAPS: 18.0000 ug | ORAL_CAPSULE | Freq: Every day | RESPIRATORY_TRACT | Status: DC

## 2014-09-11 NOTE — Telephone Encounter (Signed)
Called made kim aware. Nothing further needed

## 2014-09-11 NOTE — Patient Instructions (Signed)
Try dulera 100 Take 2 puffs first thing in am and then another 2 puffs about 12 hours later to see if less throat irritation    If you are satisfied with your treatment plan,  let your doctor know and he/she can either refill your medications or you can return here when your prescription runs out.     If in any way you are not 100% satisfied,  please tell us.  If 100% better, tell your friends!  Pulmonary follow up is as needed

## 2014-09-11 NOTE — Telephone Encounter (Signed)
Spoke with Tiffany- made aware of rec's per MW. Tiffany states that she is going to contact patient to see when available for appt and will back.

## 2014-09-11 NOTE — Telephone Encounter (Signed)
Yes, cancel neb

## 2014-09-11 NOTE — Telephone Encounter (Signed)
Kim from [place] calling stating that she received order for neb machine, but when she called pt , he said that he and MW had discussed it this moring snd said that he didn't need one, she is calling to get clairfication, please advise she can be reached @ [phone].Hillery Hunter

## 2014-09-11 NOTE — Telephone Encounter (Signed)
Called spoke with Maudie Mercury from [place]. She received order to get pt a new neb machine but when they called pt he told them MW stated he did not need it. Please advise thanks

## 2014-09-11 NOTE — Progress Notes (Signed)
Subjective:    Patient ID: Edward [name], male    DOB: 09-29-1929   MRN: [id number]     Brief patient profile:  67  yowm who quit smoking in 1998 with an FEV1 of 61% (GOLD II) recorded in August of 2007 at wt low 190s    History of Present Illness  June 19, 2008 ov to refill spiriva using primatene after exerts for faster recovery. no increase doe, cough or tendency to exacerbations. ex tol = > moderate adl's.  rec no change unless rescue need rises   06/13/2011 f/u ov/Nolan Tuazon cc no change doe, not very limited but not very active, no purulent sputum, not really limited from desired activities rec High doses of lopressor (Toprol/ metaprolol) can potentially block the advair benefits and both your blood pressure and pulse are very low today so I recommend you speak to Dr Wynonia Lawman at your convenience to adjust this medication to the lowest effective dose or consider alternatives (bisoprolol and bystolic which don't  Block the effects of advair). Pulmonary follow up can be as needed   06/21/2012 f/u ov/Freddy Kinne cc sob much better even 10 h p rx with dulera, attributes to change in B blocker, not using saba hfa at all. rec F/u prn   08/27/2013 f/u ov/Harlyn Italiano re: copd GOLD III/IV Chief Complaint  Patient presents with  . Follow-up    Pt states breathing is doing well.  No new co's today.   Not limited by breathing from desired activities  - no need for saba  At all.  rec Use your dulera and spiriva first thing in am Pulmonary f/u is prn   09/11/2014 yealry f/u ov/Jaella Weinert re:  GOLD III COPD  On dulera 200/ spiriva/ rare saba and now on amiodarone  Chief Complaint  Patient presents with  . Follow-up    Pt last seen March 2015. Pt states that his breathing has been "doing fine".    was in rehab / stopped the neb on d/c August 02 2014 and no change doe since but called for the neb to be prescribed/ no 02  Limited by legs but able to do mailbox and back  Main c/o = hoarseness    No obvious daytime  variabilty or assoc chronic cough or cp or chest tightness, subjective wheeze overt sinus or hb symptoms. No unusual exp hx or h/o childhood pna/ asthma or premature birth to his knowledge.     Sleeping ok without nocturnal  or early am exacerbation  of respiratory  c/o's or need for noct saba. Also denies any obvious fluctuation of symptoms with weather or environmental changes or other aggravating or alleviating factors except as outlined above   ROS  The following are not active complaints unless bolded sore throat, dysphagia, dental problems, itching, sneezing,  nasal congestion or excess/ purulent secretions, ear ache,   fever, chills, sweats, unintended wt loss, pleuritic or exertional cp, hemoptysis,  orthopnea pnd or leg swelling, presyncope, palpitations, heartburn, abdominal pain, anorexia, nausea, vomiting, diarrhea  or change in bowel or urinary habits, change in stools or urine, dysuria,hematuria,  rash, arthralgias, visual complaints, headache, numbness weakness or ataxia or problems with walking or coordination,  change in mood/affect or memory.            Past Medical History:  Hypertension  COPD  - PFT's 11/15/05 FEV1 46% ratio 60 with 25% response to B2  - PFT's 01/25/06 FEV1 61%, ratio 46%, no better after B2  - Add on Advair  May 18, 2010 > improved June 29, 2010  Hyperlipidemia  Gout  Depression  CAD - s/p CABG '89, redo '00  Dyslipidemia  chronic LBBB  diastolic CHF  PVD s/p B CEA  MD roster:  pulm - Hanson Medeiros  card - tilley             Objective:   Physical Exam  Pot bellied hoarse wm nad     wt   185 June 19, 2008 >   > 194 June 29, 2010  > 09/20/2010  189    > 02/20/2012  195 >   05/21/2012 194 > 06/21/2012 191 >08/27/2013 194 > 09/11/2014 150   HEENT mild turbinate edema. Top denture  Oropharynx no thrush or excess pnd or cobblestoning. No JVD or cervical adenopathy. Mild accessory muscle hypertrophy. Trachea midline, nl thryroid. Chest was  hyperinflated by percussion with diminished breath sounds and moderate increased exp time without wheeze. Hoover sign positive at mid inspiration. Regular rate and rhythm without murmur gallop or rub or increase P2. No edema. Abd: no hsm, nl excursion. Ext warm without cyanosis or clubbing     I personally reviewed images and agree with radiology impression as follows:  CXR:  06/24/14  Mildly increased basilar interstitial densities are noted concerning for pulmonary edema with associated pleural effusions. Stable right upper lobe opacity is noted concerning for edema or possibly pneumonia.   Assessment & Plan:

## 2014-09-11 NOTE — Telephone Encounter (Signed)
Order has been placed for new nebulizer machine. lmtcb x1 for pt to schedule appointment with MW.

## 2014-09-14 ENCOUNTER — Encounter: Payer: Self-pay | Admitting: Internal Medicine

## 2014-09-14 NOTE — Assessment & Plan Note (Signed)
-   quit smoking in 1998  - PFT's 11/15/05 FEV1 46% ratio 60 with 25% response to B2  - PFT's 01/25/06 FEV1 61%, ratio 46%, no better after B2  - PFT's 05/21/2012 FEV1  1.30 (50%) ratio 52 and no change p B2 and DLCO 66% corrects to 81 -    05/21/2012 > try dulera 200 2bid    - 09/11/2014 p extensive coaching HFA effectiveness =    90%   I had an extended summary  discussion with the patient reviewing all relevant studies completed to date and  lasting 15 to 20 minutes of a 25 minute visit on the following ongoing concerns:  1) he has relatively stable mod severe copd sp remote cessation adn should remain so indefinitely but is at risk of aeocod  and amio toxicity and they will be hard to separate from one another so in event of worsening sob should return here asap  2) hoarseness is probably related to ICS component of rx which is probably not that important anyway so rec trial of dulera 100 2bid  3) Each maintenance medication was reviewed in detail including most importantly the difference between maintenance and as needed and under what circumstances the prns are to be used.  Please see instructions for details which were reviewed in writing and the patient given a copy.   4) pulmonary f/u is prn

## 2014-09-19 NOTE — H&P (Signed)
Edward [name], Edward [name]  Date of visit:  09/19/2014 DOB:  [birth year]    Age:  81 yrs. Medical record number:  [id number]     Account number:  [id number] Primary Care Provider: [name]  2. Dyspnea, unspecified  3. Encounter for preprocedural cardiovascular examination  4. Presence of cardiac pacemaker  5. Peripheral vascular disease, unspecified  6. Chronic kidney disease, stage 3 (moderate)  7. Paroxysmal atrial fibrillation  8. Chronic diastolic heart failure  9. Nonrheumatic aortic (valve) stenosis  10. Left bundle-branch block, unspecified  11. Essential (primary) hypertension  12. Chronic obstructive pulmonary disease, unspecified  13. Long term (current) use of anticoagulants  14. Other hyperlipidemia  15. Malignant Neoplasm Of Bladder, Unspecified  16. Old myocardial infarction  10. Occlusion and stenosis of bilateral carotid arteries  18. Presence of aortocoronary bypass graft  19. Idiopathic Gout, Unspecified Site ____________________________ ALLERGIES  Ace Inhibitors, Dry cough  Clarithromycin, Intolerance-unknown  Codeine, Intolerance-unknown ____________________________ MEDICATIONS  1. Spiriva with HandiHaler 18 mcg capsule, w/inhalation device, 1 p.o. daily  2. Colcrys 0.6 mg tablet, PRN  3. Dulera 200-5 mcg/actuation HFA aerosol inhaler, 2 puff bid  4. warfarin 1 mg tablet, 1 qd or as directed  5. nitroglycerin 0.4 mg sublingual tablet, PRN  6. diazepam 5 mg tablet, QHS  7. amiodarone 200 mg tablet, 1 p.o. daily  8. Crestor 20 mg tablet, 1 p.o. daily  9. furosemide 40 mg tablet, alternating 20mg  with 40mg  daily  10. temazepam 15 mg capsule, hs prn ____________________________ CHIEF COMPLAINTS  Followup of Atherosclerotic heart disease of native coronary artery without angina pectoris  Followup of Long term (current) use of  anticoagulants  Followup of Paroxysmal atrial fibrillation ____________________________ HISTORY OF PRESENT ILLNESS  Patient seen for followup of atrial fibrillation. His weight has remained down and his breathing is reasonable although he continues to have some dyspnea. He complains of mild wheeze cough and drainage today. He developed recurrent atrial fibrillation following bladder surgery and has been out of rhythm about 40 days. He has now been anticoagulated for one month with a therapeutic INR and cardioversion is advised now because of his continued symptoms. He denies angina. He has no PND or orthopnea. He has mild claudication. ____________________________ PAST HISTORY  Past Medical Illnesses:  hypertension, hyperlipidemia, peripheral vascular disease, COPD, history of shingles, gout, GERD;  Cardiovascular Illnesses:  diastolic CHF, CAD, S/P MI-inferior, conduction disorder-LBBB, second degree heart block, atrial fibrillation;  Surgical Procedures:  redo CABG w SVG to OM, SVG to RCA8/10/00 Dr. Cyndia Bent, CABG w LIMA to LAD, SVG to dx, OM, RCA 1989 Dr. Arlyce Dice, carotid endarterectomy-bil, cholecystectomy, hemorrhoidectomy, [name] carotid subclavian bypass, repair of anal fissure, AAA repair, R Shoulder surgery x2;  NYHA Classification:  II;  Canadian Angina Classification:  Class 0: Asymptomatic;  Cardiology Procedures-Invasive:  cardiac cath (left) 2000, cardioversion August 2015;  Cardiology Procedures-Noninvasive:  adenosine cardiolte March 2008, treadmill cardiolite June 2011, echocardiogram June 2014, echocardiogram July 2015;  Cardiac Cath Results:  normal Left main, occluded LAD, occluded CFX, occluded RCA, occluded Diag 1 SVG, occluded RCA SVG, 99% stenosis CFX SVG, widely patent LAD LIMA graft;  LVEF of 45% documented via echocardiogram on 06/19/2014,   ____________________________ CARDIO-PULMONARY TEST DATES EKG Date:  08/14/2014;   Cardiac Cath Date:  05/10/2007;  CABG: 01/07/1999;  Nuclear Study  Date:  11/26/2009;  Echocardiography Date: 06/19/2014;  Chest Xray Date: 11/18/2013;   ____________________________ FAMILY  HISTORY Brother -- Brother dead, Diabetes mellitus Brother -- Brother alive and well Father -- Father dead, Leukemia Mother -- Mother dead, Coronary Artery Disease Sister -- Sister alive and well Sister -- Sister dead Sister -- Sister alive with problem, Cancer Sister -- Sister alive and well Sister -- Sister dead, Cancer Sister -- Coronary Artery Disease ____________________________ SOCIAL HISTORY Alcohol Use:  beer;  Smoking:  used to smoke but quit 1998, greater than 50 pack year history;  Diet:  regular diet without modifications;  Lifestyle:  widower;  Exercise:  exercise is limited due to physical disability;  Occupation:  retired Astronomer;  Residence:  lives with son;   ____________________________ REVIEW OF SYSTEMS General:  malaise and fatigue, weight gain of approximately 5 lbs  Integumentary:no rashes or new skin lesions. Eyes: wears eye glasses/contact lenses, cataracts Respiratory: dyspnea with exertion Cardiovascular:  please review HPI Abdominal: denies dyspepsia, GI bleeding, constipation, or diarrhea Musculoskeletal:  edema Neurological:  denies headaches, stroke, or TIA  ____________________________ PHYSICAL EXAMINATION VITAL SIGNS  Blood Pressure:  100/60 Sitting, Right arm, regular cuff  , 104/62 Standing, Right arm and regular cuff   Pulse:  86/min. Weight:  150.00 lbs. Height:  70"BMI: 21  Constitutional:  pleasant white male in no acute distress Skin:  warm and dry to touch, no apparent skin lesions, or masses noted. Head:  normocephalic, normal hair pattern, no masses or tenderness ENT:  ears, nose and throat reveal no gross abnormalities.  Dentition good. Neck:  bilateral carotid endarterectomy scars, no JVD, no bruits, no masses, non-tender Chest:  healed median sternotomy scar, healed pacemaker incision in the left pectoral area, fine  rales both bases Cardiac:  regular rhythm, normal S1 and S2, no S3 or S4, grade 1/6 systolic murmur at aortic area radiating to neck Peripheral Pulses:  bilateral femoral bruits present, femoral pulses 2+, dorsalis pedis pulses diminished, posterior tibial pulses diminished Extremities & Back:  well healed saphenous vein donor site RLE, well healed saphenous vein donor site LLE, no edema Neurological:  no gross motor or sensory deficits noted, affect appropriate, oriented x3. ____________________________ MOST RECENT LIPID PANEL 11/18/13  CHOL TOTL 119 mg/dl, LDL 57 NM, HDL 47 mg/dl, TRIGLYCER 74 mg/dl and CHOL/HDL 2.5 (Calc) ____________________________ IMPRESSIONS/PLAN  1. Persistent atrial fibrillation 2. Chronic combined systolic and diastolic heart failure with ejection fraction of 45% 3. Left bundle branch block 4. Functioning permanent pacemaker 5. Long-term anticoagulation therapeutic  Recommendations:  Cardioversion discussed with the patient including risks of stroke, arrhythmia, death, or anesthesia risks. The patient understands and is willing to proceed. ____________________________ TODAYS ORDERS  1. Coag Clinic Visit: Coag OV 1 month  2. Basic Metabolic Panel: Today  3. Complete Blood Count: Today  4. Electrical Cardioversion: At Patient Convenience                       ____________________________ Cardiology Physician:  Kerry Hough MD [facility]

## 2014-09-26 ENCOUNTER — Encounter (HOSPITAL_COMMUNITY): Payer: Self-pay | Admitting: *Deleted

## 2014-09-26 ENCOUNTER — Ambulatory Visit (HOSPITAL_COMMUNITY): Payer: Medicare Other | Admitting: Certified Registered Nurse Anesthetist

## 2014-09-26 ENCOUNTER — Ambulatory Visit (HOSPITAL_COMMUNITY)
Admission: RE | Admit: 2014-09-26 | Discharge: 2014-09-26 | Disposition: A | Discharge status: Home / Self Care | Payer: Medicare Other | Source: Ambulatory Visit | Attending: Cardiology | Admitting: Cardiology

## 2014-09-26 ENCOUNTER — Encounter (HOSPITAL_COMMUNITY)
Admission: RE | Disposition: A | Discharge status: Home / Self Care | Payer: Self-pay | Source: Ambulatory Visit | Attending: Cardiology

## 2014-09-26 DIAGNOSIS — I739 Peripheral vascular disease, unspecified: Secondary | ICD-10-CM | POA: Diagnosis not present

## 2014-09-26 DIAGNOSIS — J449 Chronic obstructive pulmonary disease, unspecified: Secondary | ICD-10-CM | POA: Insufficient documentation

## 2014-09-26 DIAGNOSIS — I252 Old myocardial infarction: Secondary | ICD-10-CM | POA: Diagnosis not present

## 2014-09-26 DIAGNOSIS — F329 Major depressive disorder, single episode, unspecified: Secondary | ICD-10-CM | POA: Insufficient documentation

## 2014-09-26 DIAGNOSIS — I4891 Unspecified atrial fibrillation: Secondary | ICD-10-CM | POA: Diagnosis present

## 2014-09-26 DIAGNOSIS — I2510 Atherosclerotic heart disease of native coronary artery without angina pectoris: Secondary | ICD-10-CM | POA: Diagnosis not present

## 2014-09-26 HISTORY — PX: CARDIOVERSION: SHX1299

## 2014-09-26 LAB — PROTIME-INR
INR: 2.09 — AB (ref 0.00–1.49)
Prothrombin Time: 23.6 seconds — ABNORMAL HIGH (ref 11.6–15.2)

## 2014-09-26 SURGERY — CARDIOVERSION
Anesthesia: Monitor Anesthesia Care

## 2014-09-26 MED ORDER — SODIUM CHLORIDE 0.9 % IV SOLN
INTRAVENOUS | Status: DC | PRN
  Administered 2014-09-26: 13:00:00 via INTRAVENOUS

## 2014-09-26 MED ORDER — PROPOFOL 10 MG/ML IV BOLUS
INTRAVENOUS | Status: DC | PRN
  Administered 2014-09-26: 50 mg via INTRAVENOUS

## 2014-09-26 MED ORDER — LIDOCAINE HCL (CARDIAC) 20 MG/ML IV SOLN
INTRAVENOUS | Status: DC | PRN
  Administered 2014-09-26: 40 mg via INTRAVENOUS

## 2014-09-26 MED ORDER — HYDROCORTISONE 1 % EX CREA: 1.0000 "application " | TOPICAL_CREAM | Freq: Three times a day (TID) | CUTANEOUS | Status: DC | PRN

## 2014-09-26 MED ORDER — SODIUM CHLORIDE 0.9 % IV SOLN
INTRAVENOUS | Status: DC
Start: 2014-09-26 — End: 2014-09-26
  Administered 2014-09-26: 500 mL via INTRAVENOUS

## 2014-09-26 NOTE — Anesthesia Postprocedure Evaluation (Signed)
  Anesthesia Post-op Note  Patient: Edward [name]  Procedure(s) Performed: Procedure(s): CARDIOVERSION (N/A)  Patient Location: PACU and Endoscopy Unit  Anesthesia Type:MAC  Level of Consciousness: awake  Airway and Oxygen Therapy: Patient Spontanous Breathing  Post-op Pain: mild  Post-op Assessment: Post-op Vital signs reviewed  Post-op Vital Signs: Reviewed  Last Vitals:  Filed Vitals:   09/26/14 1355  BP: 111/62  Pulse: 71  Temp:   Resp: 13    Complications: No apparent anesthesia complications

## 2014-09-26 NOTE — Interval H&P Note (Signed)
History and Physical Interval Note:  09/26/2014 12:56 PM  Edward [name]  has presented today for surgery, with the diagnosis of AFIB  The various methods of treatment have been discussed with the patient and family. After consideration of risks, benefits and other options for treatment, the patient has consented to  Procedure(s): CARDIOVERSION (N/A) as a surgical intervention .  The patient's history has been reviewed, patient examined, no change in status, stable for surgery.  I have reviewed the patient's chart and labs.  Questions were answered to the patient's satisfaction.     TILLEY JR,W SPENCER

## 2014-09-26 NOTE — Transfer of Care (Signed)
Immediate Anesthesia Transfer of Care Note  Patient: Edward [name]  Procedure(s) Performed: Procedure(s): CARDIOVERSION (N/A)  Patient Location: Endoscopy Unit  Anesthesia Type:MAC  Level of Consciousness: awake, alert  and oriented  Airway & Oxygen Therapy: Patient Spontanous Breathing and Patient connected to nasal cannula oxygen  Post-op Assessment: Report given to RN, Post -op Vital signs reviewed and stable and Patient moving all extremities  Post vital signs: Reviewed and stable  Last Vitals:  Filed Vitals:   09/26/14 1208  Pulse: 79  Temp: 36.3 C  Resp: 16    Complications: No apparent anesthesia complications

## 2014-09-26 NOTE — CV Procedure (Signed)
Electrical Cardioversion Procedure Note  Edward [name]   81 y.o. male MRN: [id number] DOB: 09/25/1929  Today's date: 09/26/2014  Procedure: Electrical Cardioversion  Indications:  Atrial Fibrillation  Time Out: Verified patient identification, verified procedure,medications/allergies/relevent history reviewed, required imaging and test results available.  Performed  Procedure Details  The patient was NPO after midnight. Anesthesia was administered at the beside  by Dr.Edwards with 40 mg of lidocaine and 50 mg of propofol.  Cardioversion was done with synchronized biphasic defibrillation with AP pads with 100 watts.  The patient converted to normal sinus rhythm. The patient tolerated the procedure well   IMPRESSION:  Successful cardioversion of atrial fibrillation    W. Tollie Eth, Brooke Bonito. MD [facility]   09/26/2014, 1:11 PM

## 2014-09-26 NOTE — Anesthesia Preprocedure Evaluation (Addendum)
Anesthesia Evaluation  Patient identified by MRN, date of birth, ID band Patient awake    History of Anesthesia Complications Negative for: history of anesthetic complications  Airway Mallampati: II  TM Distance: >3 FB Neck ROM: Full    Dental  (+) Dental Advisory Given, Upper Dentures, Lower Dentures   Pulmonary COPDformer smoker,  breath sounds clear to auscultation  Pulmonary exam normal       Cardiovascular + CAD, + Past MI and + Peripheral Vascular Disease + dysrhythmias Atrial Fibrillation + pacemaker Rhythm:Irregular     Neuro/Psych PSYCHIATRIC DISORDERS Depression    GI/Hepatic negative GI ROS, Neg liver ROS,   Endo/Other    Renal/GU Renal disease     Musculoskeletal   Abdominal   Peds  Hematology   Anesthesia Other Findings   Reproductive/Obstetrics                         Anesthesia Physical Anesthesia Plan  ASA: III  Anesthesia Plan: MAC   Post-op Pain Management:    Induction: Intravenous  Airway Management Planned: Simple Face Mask  Additional Equipment:   Intra-op Plan:   Post-operative Plan:   Informed Consent:   Dental advisory given  Plan Discussed with: CRNA and Anesthesiologist  Anesthesia Plan Comments:        Anesthesia Quick Evaluation

## 2014-09-26 NOTE — Discharge Instructions (Signed)
Patient will benefit from ongoing skilled PT services in {setting:3041680} to continue to advance safe functional mobility, address ongoing impairments in ***, and minimize fall risk.Electrical Cardioversion Electrical cardioversion is the delivery of a jolt of electricity to change the rhythm of the heart. Sticky patches or metal paddles are placed on the chest to deliver the electricity from a device. This is done to restore a normal rhythm. A rhythm that is too fast or not regular keeps the heart from pumping well. Electrical cardioversion is done in an emergency if:   There is low or no blood pressure as a result of the heart rhythm.   Normal rhythm must be restored as fast as possible to protect the brain and heart from further damage.   It may save a life. Cardioversion may be done for heart rhythms that are not immediately life threatening, such as atrial fibrillation or flutter, in which:   The heart is beating too fast or is not regular.   Medicine to change the rhythm has not worked.   It is safe to wait in order to allow time for preparation.  Symptoms of the abnormal rhythm are bothersome.  The risk of stroke and other serious problems can be reduced. LET [facility] CARE PROVIDER KNOW ABOUT:   Any allergies you have.  All medicines you are taking, including vitamins, herbs, eye drops, creams, and over-the-counter medicines.  Previous problems you or members of your family have had with the use of anesthetics.   Any blood disorders you have.   Previous surgeries you have had.   Medical conditions you have. RISKS AND COMPLICATIONS  Generally, this is a safe procedure. However, problems can occur and include:   Breathing problems related to the anesthetic used.  A blood clot that breaks free and travels to other parts of your body. This could cause a stroke or other problems. The risk of this is lowered by use of blood-thinning medicine (anticoagulant) prior to  the procedure.  Cardiac arrest (rare). BEFORE THE PROCEDURE   You may have tests to detect blood clots in your heart and to evaluate heart function.  You may start taking anticoagulants so your blood does not clot as easily.   Medicines may be given to help stabilize your heart rate and rhythm. PROCEDURE  You will be given medicine through an IV tube to reduce discomfort and make you sleepy (sedative).   An electrical shock will be delivered. AFTER THE PROCEDURE Your heart rhythm will be watched to make sure it does not change.  Document Released: 05/06/2002 Document Revised: 09/30/2013 Document Reviewed: 11/28/2012 [facility] Patient Information 2015 [place], [place]. This information is not intended to replace advice given to you by your health care provider. Make sure you discuss any questions you have with your health care provider.

## 2014-09-29 ENCOUNTER — Encounter (HOSPITAL_COMMUNITY): Payer: Self-pay | Admitting: Cardiology

## 2014-10-03 ENCOUNTER — Telehealth: Payer: Self-pay

## 2014-10-03 NOTE — Telephone Encounter (Signed)
Edward [name] will advised patient

## 2014-10-03 NOTE — Telephone Encounter (Signed)
Please ask patient to bring in ALL actual pill bottles as I am seeing him in Dr Katheren Puller absence and can not refill any w/o seeing actual pills

## 2014-10-03 NOTE — Telephone Encounter (Signed)
Patient of Dr Asa Lente. Has an appointment on Monday at 4 pm.   Patient's son states patient is doubling even tripling certain medications. Possible dementia. He has fallen. Still drives. Patient's son called and made the appointment but patient does not allow son in the room when he comes for his appointments nor is the son on the release of information regarding patient. Lebron Conners (Glass blower/designer) has been advised on this. This is an FYI for you to see how you feel about it etc. Please advise.

## 2014-10-06 ENCOUNTER — Encounter (INDEPENDENT_AMBULATORY_CARE_PROVIDER_SITE_OTHER): Payer: Medicare Other | Admitting: Ophthalmology

## 2014-10-06 ENCOUNTER — Ambulatory Visit (INDEPENDENT_AMBULATORY_CARE_PROVIDER_SITE_OTHER): Payer: Medicare Other | Admitting: Internal Medicine

## 2014-10-06 ENCOUNTER — Encounter: Payer: Self-pay | Admitting: Internal Medicine

## 2014-10-06 VITALS — BP 100/68 | HR 73 | Temp 98.5°F

## 2014-10-06 DIAGNOSIS — N183 Chronic kidney disease, stage 3 unspecified: Secondary | ICD-10-CM

## 2014-10-06 DIAGNOSIS — R2689 Other abnormalities of gait and mobility: Secondary | ICD-10-CM | POA: Diagnosis not present

## 2014-10-06 DIAGNOSIS — G479 Sleep disorder, unspecified: Secondary | ICD-10-CM

## 2014-10-06 DIAGNOSIS — R627 Adult failure to thrive: Secondary | ICD-10-CM

## 2014-10-06 DIAGNOSIS — I5032 Chronic diastolic (congestive) heart failure: Secondary | ICD-10-CM

## 2014-10-06 DIAGNOSIS — J449 Chronic obstructive pulmonary disease, unspecified: Secondary | ICD-10-CM

## 2014-10-06 DIAGNOSIS — C679 Malignant neoplasm of bladder, unspecified: Secondary | ICD-10-CM | POA: Diagnosis not present

## 2014-10-06 NOTE — Progress Notes (Signed)
   Subjective:    Patient ID: Edward [name], male    DOB: 10-24-1929, 81 y.o.   MRN: [id number]  HPI Apparently he fell sometime within the last 4 days on 2 occasions. The first time he fell into a closet; the second time he fell onto a wooden jewelry box. He sustained abrasions to his forehead but had no loss of consciousness. He states the falls were related to dizziness & loss of balance. There was no cardiac or neurologic prodrome prior to the falls.  He's had difficulty sleeping. He states he takes occasional daytime naps. His history is somewhat vague; he does drink "some beer". A handwritten note from his daughter states: " doesn't eat; can not sleep; drinks wine & beer". Apparently his been taking Valium 5 mg for sleep.HE IS ON WARFARIN> He lives by himself; he states his son is with him "part-time; but he's not much help".  Apparently he was hospitalized for prolonged period time by his urologist and was in rehabilitation following discharge.Record review documents 1/18-1/30/16 for bladder cancer; protein malnutrition& acute resp failure.     Review of Systems  Denied were any change in heart rhythm or rate prior to the event. There was no associated chest pain or shortness of breath .  Also specifically denied prior to the episode were headache, limb weakness, tingling, or numbness. No seizure activity noted.     Objective:   Physical Exam Pertinent or positive findings include:  Appears disheveled ,chronically ill and very lethargic.  He has 3 abrasions of the forehead which measure 20 x 7 mm, 20 x 20 mm, and 25 x 17 mm. There is no associated purulence or cellulitis.  Upper plate and lower partial present.  He has a left carotid bruit.  There is a grade 1 systolic murmur. Scattered rhonchi are noted.  Abdomen is protuberant.  Pitting edema is noted bilaterally.  Pedal pulses are nonpalpable.  He has isolated DIP osteoarthritic changes in the hands. He has extensive  bruising of the forearms.  Eyes: No conjunctival inflammation or scleral icterus is present. Oral exam:  Lips and gums are healthy appearing.There is no oropharyngeal erythema or exudate noted.  Heart:  Normal rate and regular rhythm. S1 and S2 normal without gallop, click, rub or other extra sounds   Lungs:No increased work of breathing.  Abdomen: bowel sounds normal, soft and non-tender without masses, organomegaly or hernias noted.  No guarding or rebound.  Vascular : all pulses equal  Skin:Warm & dry.  Intact without suspicious lesions or rashes ; some tenting;no jaundice  Lymphatic: No lymphadenopathy is noted about the head, neck, axilla Neuro: Strength, tone markedly decreased         Assessment & Plan:  #1 profound debilitation   #2 sleep disorder for which he takes Valium possibly with alcohol  #3 recurrent falls without cardiac or neurologic prodrome #4family dynamics suboptimal  Plan: Recommend the family and he work with an elder attorney for completion of healthcare power of attorney and living will. At present apparently his daughter Edward [name] is healthcare power of attorney butsimply pays his bills.  Physical therapy will be consulted  Sleep hygiene will be enforced. I frankly stated that Valium or other sedatives were contraindicated at his age with his comorbidities and recurrent falls. Such especially with alcohol intake and his debilitation is quite likely the major component or reason for those falls.

## 2014-10-06 NOTE — Patient Instructions (Addendum)
To prevent sleep dysfunction follow these instructions for sleep hygiene. Do not read, watch TV, or eat in bed. Do not get into bed until you are ready to turn off the light &  to go to sleep. Do not ingest stimulants ( decongestants, diet pills, nicotine, caffeine) after the evening meal.Do not take daytime naps.  The Home Health Assessment & Physical Therapy referral will be scheduled and you'll be notified of the time.Please call the Referral Co-Ordinator @ [phone] if you have not been notified of appointment time within 7-10 days.  Please pursue full completion of healthcare power of attorney and living will documents; this will place you in charge of your medical health decisions.

## 2014-10-06 NOTE — Progress Notes (Signed)
Pre visit review using our clinic review tool, if applicable. No additional management support is needed unless otherwise documented below in the visit note. 

## 2014-10-07 ENCOUNTER — Emergency Department (HOSPITAL_COMMUNITY): Payer: Medicare Other

## 2014-10-07 ENCOUNTER — Emergency Department (HOSPITAL_COMMUNITY)
Admission: EM | Admit: 2014-10-07 | Discharge: 2014-10-07 | Disposition: A | Discharge status: Home / Self Care | Payer: Medicare Other | Attending: Emergency Medicine | Admitting: Emergency Medicine

## 2014-10-07 ENCOUNTER — Encounter (HOSPITAL_COMMUNITY): Payer: Self-pay | Admitting: Emergency Medicine

## 2014-10-07 DIAGNOSIS — Z9861 Coronary angioplasty status: Secondary | ICD-10-CM | POA: Diagnosis not present

## 2014-10-07 DIAGNOSIS — Y998 Other external cause status: Secondary | ICD-10-CM | POA: Insufficient documentation

## 2014-10-07 DIAGNOSIS — W1839XA Other fall on same level, initial encounter: Secondary | ICD-10-CM | POA: Diagnosis not present

## 2014-10-07 DIAGNOSIS — Z87891 Personal history of nicotine dependence: Secondary | ICD-10-CM | POA: Insufficient documentation

## 2014-10-07 DIAGNOSIS — Z8551 Personal history of malignant neoplasm of bladder: Secondary | ICD-10-CM | POA: Diagnosis not present

## 2014-10-07 DIAGNOSIS — Z8719 Personal history of other diseases of the digestive system: Secondary | ICD-10-CM | POA: Diagnosis not present

## 2014-10-07 DIAGNOSIS — I2510 Atherosclerotic heart disease of native coronary artery without angina pectoris: Secondary | ICD-10-CM | POA: Insufficient documentation

## 2014-10-07 DIAGNOSIS — I5032 Chronic diastolic (congestive) heart failure: Secondary | ICD-10-CM | POA: Insufficient documentation

## 2014-10-07 DIAGNOSIS — Y92008 Other place in unspecified non-institutional (private) residence as the place of occurrence of the external cause: Secondary | ICD-10-CM | POA: Insufficient documentation

## 2014-10-07 DIAGNOSIS — S32050A Wedge compression fracture of fifth lumbar vertebra, initial encounter for closed fracture: Secondary | ICD-10-CM | POA: Insufficient documentation

## 2014-10-07 DIAGNOSIS — E785 Hyperlipidemia, unspecified: Secondary | ICD-10-CM | POA: Insufficient documentation

## 2014-10-07 DIAGNOSIS — Z8619 Personal history of other infectious and parasitic diseases: Secondary | ICD-10-CM | POA: Diagnosis not present

## 2014-10-07 DIAGNOSIS — Q602 Renal agenesis, unspecified: Secondary | ICD-10-CM | POA: Diagnosis not present

## 2014-10-07 DIAGNOSIS — Z79899 Other long term (current) drug therapy: Secondary | ICD-10-CM | POA: Insufficient documentation

## 2014-10-07 DIAGNOSIS — Z7901 Long term (current) use of anticoagulants: Secondary | ICD-10-CM | POA: Diagnosis not present

## 2014-10-07 DIAGNOSIS — N400 Enlarged prostate without lower urinary tract symptoms: Secondary | ICD-10-CM | POA: Diagnosis not present

## 2014-10-07 DIAGNOSIS — S3992XA Unspecified injury of lower back, initial encounter: Secondary | ICD-10-CM | POA: Diagnosis present

## 2014-10-07 DIAGNOSIS — S22010A Wedge compression fracture of first thoracic vertebra, initial encounter for closed fracture: Secondary | ICD-10-CM | POA: Diagnosis not present

## 2014-10-07 DIAGNOSIS — Z8659 Personal history of other mental and behavioral disorders: Secondary | ICD-10-CM | POA: Insufficient documentation

## 2014-10-07 DIAGNOSIS — W19XXXA Unspecified fall, initial encounter: Secondary | ICD-10-CM

## 2014-10-07 DIAGNOSIS — I252 Old myocardial infarction: Secondary | ICD-10-CM | POA: Diagnosis not present

## 2014-10-07 DIAGNOSIS — S32030A Wedge compression fracture of third lumbar vertebra, initial encounter for closed fracture: Secondary | ICD-10-CM | POA: Insufficient documentation

## 2014-10-07 DIAGNOSIS — J449 Chronic obstructive pulmonary disease, unspecified: Secondary | ICD-10-CM | POA: Diagnosis not present

## 2014-10-07 DIAGNOSIS — M109 Gout, unspecified: Secondary | ICD-10-CM | POA: Insufficient documentation

## 2014-10-07 DIAGNOSIS — S32010A Wedge compression fracture of first lumbar vertebra, initial encounter for closed fracture: Secondary | ICD-10-CM | POA: Diagnosis not present

## 2014-10-07 DIAGNOSIS — Y9389 Activity, other specified: Secondary | ICD-10-CM | POA: Diagnosis not present

## 2014-10-07 DIAGNOSIS — N183 Chronic kidney disease, stage 3 (moderate): Secondary | ICD-10-CM | POA: Diagnosis not present

## 2014-10-07 DIAGNOSIS — S0990XA Unspecified injury of head, initial encounter: Secondary | ICD-10-CM | POA: Diagnosis not present

## 2014-10-07 DIAGNOSIS — IMO0001 Reserved for inherently not codable concepts without codable children: Secondary | ICD-10-CM

## 2014-10-07 DIAGNOSIS — M4850XA Collapsed vertebra, not elsewhere classified, site unspecified, initial encounter for fracture: Secondary | ICD-10-CM

## 2014-10-07 MED ORDER — HYDROCODONE-ACETAMINOPHEN 5-325 MG PO TABS
1.0000 | ORAL_TABLET | Freq: Once | ORAL | Status: AC
Start: 2014-10-07 — End: 2014-10-07
  Administered 2014-10-07: 1 via ORAL
  Filled 2014-10-07: qty 1

## 2014-10-07 MED ORDER — HYDROCODONE-ACETAMINOPHEN 5-325 MG PO TABS: 1.0000 | ORAL_TABLET | Freq: Four times a day (QID) | ORAL | Status: DC | PRN

## 2014-10-07 MED ORDER — ONDANSETRON 4 MG PO TBDP
4.0000 mg | ORAL_TABLET | Freq: Once | ORAL | Status: AC
  Administered 2014-10-07: 4 mg via ORAL
  Filled 2014-10-07: qty 1

## 2014-10-07 MED ORDER — ONDANSETRON 4 MG PO TBDP: 4.0000 mg | ORAL_TABLET | Freq: Three times a day (TID) | ORAL | Status: DC | PRN

## 2014-10-07 NOTE — ED Notes (Signed)
Pt complaint of worsening neck and lower back pain post fall Friday. Pt evaluated for same at [facility] clinic yesterday.

## 2014-10-07 NOTE — ED Provider Notes (Signed)
TIME SEEN: 11:45 AM  CHIEF COMPLAINT: Fall, neck and back pain  HPI: Pt is a 81 y.o. male with history of hypertension, hyperlipidemia, CAD, CHF status post pacemaker, left bundle branch block, COPD, chronic kidney disease, AAA who is on Coumadin who presents to the emergency department after he had a mechanical fall 4 days ago. Reports he was getting up in the middle of night use the bathroom and felt like he was losing his balance and fell through the cause a door. Didn't hit his head. Did not pass out. Has been complaining of neck and back pain since. Normally ambulates with a cane. Denies any preceding chest pain, shortness of breath, palpitations or dizziness that led to his fall. Reports he normally ambulates with a cane. Son reports he does take sleeping pills every night and thinks this may have contributed to him losing his balance when getting up to the bathroom. He has multiple abrasions to his forehead. Reports his last tetanus vaccination was within the last 5 years. Denies chest pain, shortness of breath, abdominal pain. No numbness, tingling or focal weakness.  ROS: See HPI Constitutional: no fever  Eyes: no drainage  ENT: no runny nose   Cardiovascular:  no chest pain  Resp: no SOB  GI: no vomiting GU: no dysuria Integumentary: no rash  Allergy: no hives  Musculoskeletal: no leg swelling  Neurological: no slurred speech ROS otherwise negative  PAST MEDICAL HISTORY/PAST SURGICAL HISTORY:  Past Medical History  Diagnosis Date  . Gout     "only once in my lifetime" (12/18/2012)  . Depression   . PVD (peripheral vascular disease)     s/p B CEA  . COPD (chronic obstructive pulmonary disease)      PFT 6.19.07: FEV1 465  ratio 60 with 25% response to B2.  > PFTs 8.29.07: FEV1 61%  ratio 46% no better after B2.  > add on advair 12.20.11-improved 1.31.12  . AAA (abdominal aortic aneurysm)   . Melanosis coli   . Internal hemorrhoids   . Carotid artery occlusion   . Aortic valve  disorder   . Chronic diastolic heart failure 16/05/958  . CAD (coronary artery disease), native coronary artery     CABG w LIMA to LAD, SVG to dx, OM, RCA 1989 Dr. Arlyce Dice for 3VD PTCA of OM, 1999 and 2000 Cath showed occlusion of left main and RCA with stenosis in OM Redo redo CABG w SVG to OM, SVG to RCA8/10/00 Dr. Cyndia Bent   . Hyperlipidemia   . BPH (benign prostatic hypertrophy)   . Hypertensive heart disease     Change toprol to bisoprolol 10 mg daily on trial basis  05/21/2012  - notified by [facility] non adherent 07/17/2012    . LBBB (left bundle branch block)   . Second degree heart block 12/18/2012    s/p MDT Adapta L pacemaker 12-20-2012 by Dr Caryl Comes  . Shingles years ago    mild  . Abrasion of left forearm dec 2015    small scraped area healing  . Chronic kidney disease stage III (GFR 30-59 ml/min)     saw dr Justin Mend oct 2015 and released by dr webb  . Congenital absence of kidney     "noted during AAA repair; never knew it before" (12/18/2012)  . Cancer 05/2014    bladder, s/p uro surg resection  . Myocardial infarction 1979    MEDICATIONS:  Prior to Admission medications   Medication Sig Start Date End Date Taking? Authorizing Provider  amiodarone (PACERONE) 200 MG tablet Take 200 mg by mouth daily.    Historical Provider, MD  bisacodyl (DULCOLAX) 10 MG suppository Place 10 mg rectally daily as needed for moderate constipation or severe constipation.    Historical Provider, MD  colchicine 0.6 MG tablet Take 0.6 mg by mouth daily as needed (gout flare ups).    Historical Provider, MD  guaiFENesin (MUCINEX) 600 MG 12 hr tablet Take 600 mg by mouth 2 (two) times daily.     Historical Provider, MD  mometasone-formoterol (DULERA) 100-5 MCG/ACT AERO Take 2 puffs first thing in am and then another 2 puffs about 12 hours later. 09/11/14   Tanda Rockers, MD  nitroGLYCERIN (NITROSTAT) 0.4 MG SL tablet Place 0.4 mg under the tongue every 5 (five) minutes as needed for chest pain.    Historical  Provider, MD  polyethylene glycol (MIRALAX / GLYCOLAX) packet Take 17 g by mouth daily.     Historical Provider, MD  rosuvastatin (CRESTOR) 20 MG tablet Take 20 mg by mouth daily.      Historical Provider, MD  sennosides-docusate sodium (SENOKOT-S) 8.6-50 MG tablet Take 2 tablets by mouth daily.    Historical Provider, MD  tiotropium (SPIRIVA) 18 MCG inhalation capsule Place 1 capsule (18 mcg total) into inhaler and inhale daily. 09/11/14 09/11/15  Tanda Rockers, MD  warfarin (COUMADIN) 1 MG tablet Take 1 tablet (1 mg total) by mouth daily. Continue taking Coumadin and have PT/INR checked 06/30/2014 to have the dose adjusted appropriately 06/28/14   Theodis Blaze, MD    ALLERGIES:  Allergies  Allergen Reactions  . Ace Inhibitors Cough  . Clarithromycin Other (See Comments)    Strange thoughts and fell with biaxin  . Codeine Nausea And Vomiting  . Oxycodone-Acetaminophen Other (See Comments)    Felt closed in    SOCIAL HISTORY:  History  Substance Use Topics  . Smoking status: Former Smoker -- 1.50 packs/day for 50 years    Types: Cigarettes    Quit date: 05/30/1996  . Smokeless tobacco: Never Used  . Alcohol Use: 1.8 oz/week    3 Cans of beer per week     Comment: beer few times per week    FAMILY HISTORY: Family History  Problem Relation Age of Onset  . Heart disease Mother   . Colon cancer Neg Hx     EXAM: BP 131/75 mmHg  Pulse 84  Temp(Src) 98.2 F (36.8 C) (Oral)  Resp 19  SpO2 99% CONSTITUTIONAL: Alert and oriented and responds appropriately to questions. Well-appearing; well-nourished; GCS 15 HEAD: Normocephalic; abrasions to his forehead but otherwise no abnormality, no sign of skull fracture or hematoma EYES: Conjunctivae clear, PERRL, EOMI ENT: normal nose; no rhinorrhea; moist mucous membranes; pharynx without lesions noted; no dental injury; no septal hematoma NECK: Supple, no meningismus, no LAD; patient has some midline spinal tenderness without step-off or  deformity, c-collar in place CARD: RRR; S1 and S2 appreciated; no murmurs, no clicks, no rubs, no gallops RESP: Normal chest excursion without splinting or tachypnea; breath sounds clear and equal bilaterally; no wheezes, no rhonchi, no rales; no hypoxia or respiratory distress CHEST:  chest wall stable, no crepitus or ecchymosis or deformity, nontender to palpation ABD/GI: Normal bowel sounds; non-distended; soft, non-tender, no rebound, no guarding PELVIS:  stable, nontender to palpation BACK:  The back appears normal and is non-tender to palpation, there is no CVA tenderness; minimal tenderness to palpation throughout the thoracic and lumbar spine midline and diffusely over  the paraspinal musculature, no step-off or deformity EXT: Normal ROM in all joints; non-tender to palpation; no edema; normal capillary refill; no cyanosis, no bony tenderness or bony deformity of patient's extremities, no joint effusion, no ecchymosis or lacerations, ecchymosis over bilateral knees and bilateral forearms but no bony tenderness or deformity   SKIN: Normal color for age and race; warm NEURO: Moves all extremities equally, sensation to light touch intact diffusely, cranial nerves II through XII intact PSYCH: The patient's mood and manner are appropriate. Grooming and personal hygiene are appropriate.  MEDICAL DECISION MAKING: Patient here with mechanical fall 4 days ago. He is on Coumadin. Complaining of neck and back pain. At his neurologic baseline. We'll obtain CT of his head, cervical spine, x-rays of thoracic and lumbar spine. We'll give Vicodin for pain. Patient does have some ecchymosis of his bilateral knees and forearms but has no bony tenderness or deformity. Neurovascularly intact distally.  ED PROGRESS: Head and cervical spine CT showed no acute abnormality of the head or cervical spine but does show a small cell. Endplate compression fracture of T1 appears acute and endplate compression fractures of T2  and T3 also appear acute with 10% loss of height without retropulsion.  Patient also appears to have a mild T6 compression fracture that is new since January. Subacute L1 compression fracture that was present in January but now has increased loss of height, subacute to chronic L3 compression fracture and chronic L5 pars fractures area he is able to ambulate without difficulty. Feels like his pain is significantly controlled with Vicodin. Have advised him to follow-up with his PCP closely and that if pain continues he may need CT imaging, MRI and outpatient follow-up with interventional radiology. Discussed return precautions. They verbalize understanding and are comfortable with plan. I do not feel he needs admission at this time for pain control or vertebroplasty. Discussed with family if pain continues despite medical management that he may need a vertebroplasty as an outpatient.     [place], DO 10/07/14 1408

## 2014-10-07 NOTE — ED Notes (Signed)
Pt in DG/CT at present time; will administer medications with pt return.

## 2014-10-07 NOTE — ED Notes (Signed)
Patient transported to X-ray 

## 2014-10-07 NOTE — ED Notes (Signed)
Patient ambulated well with assistance.  

## 2014-10-07 NOTE — ED Notes (Signed)
C collar placed by EMS prior to arrival; FALL/INJURY on Friday.

## 2014-10-07 NOTE — ED Notes (Signed)
Bed: RF16 Expected date:  Expected time:  Means of arrival:  Comments: Elderly gen body aches

## 2014-10-07 NOTE — Discharge Instructions (Signed)
Head Injury °You have received a head injury. It does not appear serious at this time. Headaches and vomiting are common following head injury. It should be easy to awaken from sleeping. Sometimes it is necessary for you to stay in the emergency department for a while for observation. Sometimes admission to the hospital may be needed. After injuries such as yours, most problems occur within the first 24 hours, but side effects may occur up to 7-10 days after the injury. It is important for you to carefully monitor your condition and contact your health care provider or seek immediate medical care if there is a change in your condition. °WHAT ARE THE TYPES OF HEAD INJURIES? °Head injuries can be as minor as a bump. Some head injuries can be more severe. More severe head injuries include: °· A jarring injury to the brain (concussion). °· A bruise of the brain (contusion). This mean there is bleeding in the brain that can cause swelling. °· A cracked skull (skull fracture). °· Bleeding in the brain that collects, clots, and forms a bump (hematoma). °WHAT CAUSES A HEAD INJURY? °A serious head injury is most likely to happen to someone who is in a car wreck and is not wearing a seat belt. Other causes of major head injuries include bicycle or motorcycle accidents, sports injuries, and falls. °HOW ARE HEAD INJURIES DIAGNOSED? °A complete history of the event leading to the injury and your current symptoms will be helpful in diagnosing head injuries. Many times, pictures of the brain, such as CT or MRI are needed to see the extent of the injury. Often, an overnight hospital stay is necessary for observation.  °WHEN SHOULD I SEEK IMMEDIATE MEDICAL CARE?  °You should get help right away if: °· You have confusion or drowsiness. °· You feel sick to your stomach (nauseous) or have continued, forceful vomiting. °· You have dizziness or unsteadiness that is getting worse. °· You have severe, continued headaches not relieved by  medicine. Only take over-the-counter or prescription medicines for pain, fever, or discomfort as directed by your health care provider. °· You do not have normal function of the arms or legs or are unable to walk. °· You notice changes in the black spots in the center of the colored part of your eye (pupil). °· You have a clear or bloody fluid coming from your nose or ears. °· You have a loss of vision. °During the next 24 hours after the injury, you must stay with someone who can watch you for the warning signs. This person should contact local emergency services (911 in the [place] if you have seizures, you become unconscious, or you are unable to wake up. °HOW CAN I PREVENT A HEAD INJURY IN THE FUTURE? °The most important factor for preventing major head injuries is avoiding motor vehicle accidents.  To minimize the potential for damage to your head, it is crucial to wear seat belts while riding in motor vehicles. Wearing helmets while bike riding and playing collision sports (like football) is also helpful. Also, avoiding dangerous activities around the house will further help reduce your risk of head injury.  °WHEN CAN I RETURN TO NORMAL ACTIVITIES AND ATHLETICS? °You should be reevaluated by your health care provider before returning to these activities. If you have any of the following symptoms, you should not return to activities or contact sports until 1 week after the symptoms have stopped: °· Persistent headache. °· Dizziness or vertigo. °· Poor attention and concentration. °· Confusion. °·   Memory problems.  Nausea or vomiting.  Fatigue or tire easily.  Irritability.  Intolerant of bright lights or loud noises.  Anxiety or depression.  Disturbed sleep. MAKE SURE YOU:   Understand these instructions.  Will watch your condition.  Will get help right away if you are not doing well or get worse. Document Released: 05/16/2005 Document Revised: 05/21/2013 Document Reviewed:  01/21/2013 [facility] Patient Information 2015 [place], [place]. This information is not intended to replace advice given to you by your health care provider. Make sure you discuss any questions you have with your health care provider.   Back, Compression Fracture A compression fracture happens when a force is put upon the length of your spine. Slipping and falling on your bottom are examples of such a force. When this happens, sometimes the force is great enough to compress the building blocks (vertebral bodies) of your spine. Although this causes a lot of pain, this can usually be treated at home, unless your caregiver feels hospitalization is needed for pain control. Your backbone (spinal column) is made up of 24 main vertebral bodies in addition to the sacrum and coccyx (see illustration). These are held together by tough fibrous tissues (ligaments) and by support of your muscles. Nerve roots pass through the openings between the vertebrae. A sudden wrenching move, injury, or a fall may cause a compression fracture of one of the vertebral bodies. This may result in back pain or spread of pain into the belly (abdomen), the buttocks, and down the leg into the foot. Pain may also be created by muscle spasm alone. Large studies have been undertaken to determine the best possible course of action to help your back following injury and also to prevent future problems. The recommendations are as follows. FOLLOWING A COMPRESSION FRACTURE: Do the following only if advised by your caregiver.   If a back brace has been suggested or provided, wear it as directed.  Do not stop wearing the back brace unless instructed by your caregiver.  When allowed to return to regular activities, avoid a sedentary lifestyle. Actively exercise. Sporadic weekend binges of tennis, racquetball, or waterskiing may actually aggravate or create problems, especially if you are not in condition for that activity.  Avoid sports  requiring sudden body movements until you are in condition for them. Swimming and walking are safer activities.  Maintain good posture.  Avoid obesity.  If not already done, you should have a DEXA scan. Based on the results, be treated for osteoporosis. FOLLOWING ACUTE (SUDDEN) INJURY:  Only take over-the-counter or prescription medicines for pain, discomfort, or fever as directed by your caregiver.  Use bed rest for only the most extreme acute episode. Prolonged bed rest may aggravate your condition. Ice used for acute conditions is effective. Use a large plastic bag filled with ice. Wrap it in a towel. This also provides excellent pain relief. This may be continuous. Or use it for 30 minutes every 2 hours during acute phase, then as needed. Heat for 30 minutes prior to activities is helpful.  As soon as the acute phase (the time when your back is too painful for you to do normal activities) is over, it is important to resume normal activities and work Tourist information centre manager. Back injuries can cause potentially marked changes in lifestyle. So it is important to attack these problems aggressively.  See your caregiver for continued problems. He or she can help or refer you for appropriate exercises, physical therapy, and work hardening if needed.  If  you are given narcotic medications for your condition, for the next 24 hours do not:  Drive.  Operate machinery or power tools.  Sign legal documents.  Do not drink alcohol, or take sleeping pills or other medications that may interfere with treatment. If your caregiver has given you a follow-up appointment, it is very important to keep that appointment. Not keeping the appointment could result in a chronic or permanent injury, pain, and disability. If there is any problem keeping the appointment, you must call back to this facility for assistance.  SEEK IMMEDIATE MEDICAL CARE IF:  You develop numbness, tingling, weakness, or problems with the  use of your arms or legs.  You develop severe back pain not relieved with medications.  You have changes in bowel or bladder control.  You have increasing pain in any areas of the body. Document Released: 05/16/2005 Document Revised: 09/30/2013 Document Reviewed: 12/19/2007 [facility] Patient Information 2015 [place], [place]. This information is not intended to replace advice given to you by your health care provider. Make sure you discuss any questions you have with your health care provider.

## 2014-10-14 ENCOUNTER — Ambulatory Visit (INDEPENDENT_AMBULATORY_CARE_PROVIDER_SITE_OTHER): Payer: Medicare Other | Admitting: Internal Medicine

## 2014-10-14 ENCOUNTER — Encounter: Payer: Self-pay | Admitting: Internal Medicine

## 2014-10-14 VITALS — BP 120/70 | HR 73 | Temp 98.1°F | Ht 71.0 in | Wt 149.2 lb

## 2014-10-14 DIAGNOSIS — R269 Unspecified abnormalities of gait and mobility: Secondary | ICD-10-CM

## 2014-10-14 DIAGNOSIS — R627 Adult failure to thrive: Secondary | ICD-10-CM | POA: Diagnosis not present

## 2014-10-14 DIAGNOSIS — S22010S Wedge compression fracture of first thoracic vertebra, sequela: Secondary | ICD-10-CM

## 2014-10-14 NOTE — Progress Notes (Signed)
Subjective:    Patient ID: REVAN GENDRON, male    DOB: 1930/01/01, 81 y.o.   MRN: [id number]  HPI  Patient here for follow-up. Chronic conditions, interval events and current concerns reviewed  Continued pain in neck and back since fall approximate 1 week ago. ER visit for same reviewed  Family reports poor appetite and excessive sedation/mental status changes related to benzodiazepine +EtOH use (which patient denies current use)  Past Medical History  Diagnosis Date  . Gout     "only once in my lifetime" (12/18/2012)  . Depression   . PVD (peripheral vascular disease)     s/p B CEA  . COPD (chronic obstructive pulmonary disease)      PFT 6.19.07: FEV1 465  ratio 60 with 25% response to B2.  > PFTs 8.29.07: FEV1 61%  ratio 46% no better after B2.  > add on advair 12.20.11-improved 1.31.12  . AAA (abdominal aortic aneurysm)   . Melanosis coli   . Internal hemorrhoids   . Carotid artery occlusion   . Aortic valve disorder   . Chronic diastolic heart failure 44/0/3474  . CAD (coronary artery disease), native coronary artery     CABG w LIMA to LAD, SVG to dx, OM, RCA 1989 Dr. Arlyce Dice for 3VD PTCA of OM, 1999 and 2000 Cath showed occlusion of left main and RCA with stenosis in OM Redo redo CABG w SVG to OM, SVG to RCA8/10/00 Dr. Cyndia Bent   . Hyperlipidemia   . BPH (benign prostatic hypertrophy)   . Hypertensive heart disease     Change toprol to bisoprolol 10 mg daily on trial basis  05/21/2012  - notified by [facility] non adherent 07/17/2012    . LBBB (left bundle branch block)   . Second degree heart block 12/18/2012    s/p MDT Adapta L pacemaker 12-20-2012 by Dr Caryl Comes  . Shingles years ago    mild  . Abrasion of left forearm dec 2015    small scraped area healing  . Chronic kidney disease stage III (GFR 30-59 ml/min)     saw dr Justin Mend oct 2015 and released by dr webb  . Congenital absence of kidney     "noted during AAA repair; never knew it before" (12/18/2012)  . Cancer 05/2014   bladder, s/p uro surg resection  . Myocardial infarction 1979    Review of Systems  Constitutional: Positive for appetite change and fatigue. Negative for fever.  Respiratory: Negative for cough and shortness of breath.   Cardiovascular: Negative for chest pain and leg swelling.  Musculoskeletal: Positive for myalgias and gait problem.  Neurological: Positive for weakness (diffuse). Negative for headaches.       Objective:    Physical Exam  Constitutional:  Pt in WC, soft neck collar - Son Kelly at side  Cardiovascular: Normal rate, regular rhythm and normal heart sounds.  Exam reveals no friction rub.   No murmur heard. Pulmonary/Chest: Effort normal and breath sounds normal.  Neurological: He is alert.  Gait not tested due to instability and debilitation    BP 120/70 mmHg  Pulse 73  Temp(Src) 98.1 F (36.7 C) (Oral)  Ht 5\' 11"  (1.803 m)  Wt 149 lb 4 oz (67.699 kg)  BMI 20.83 kg/m2  SpO2 94% Wt Readings from Last 3 Encounters:  10/14/14 149 lb 4 oz (67.699 kg)  09/26/14 150 lb (68.04 kg)  09/11/14 150 lb 3.2 oz (68.13 kg)    Lab Results  Component Value Date  WBC 7.8 08/12/2014   HGB 10.8* 08/12/2014   HCT 33.0* 08/12/2014   PLT 266.0 08/12/2014   GLUCOSE 111* 08/12/2014   CHOL 122 10/30/2013   TRIG 95.0 10/30/2013   HDL 50.30 10/30/2013   LDLCALC 53 10/30/2013   ALT 17 06/18/2014   AST 34 06/18/2014   NA 135 08/12/2014   K 4.3 08/12/2014   CL 103 08/12/2014   CREATININE 1.55* 08/12/2014   BUN 24* 08/12/2014   CO2 30 08/12/2014   TSH 2.63 10/30/2013   INR 2.09* 09/26/2014    Dg Thoracic Spine 2 View  10/07/2014   CLINICAL DATA:  81 year old male who fell at home. Thoracolumbar pain. Initial encounter.  EXAM: THORACIC SPINE - 2 VIEW  COMPARISON:  Chest radiographs 06/24/2014 and earlier.  FINDINGS: Partially visible chronic cardiac pacemaker. Sequelae of CABG. Surgical clips Re identified in the bilateral neck.  Osteopenia. Thoracic segmentation appears  to be normal. Mild Concave deformity of the T6 superior endplate is new. Thoracic vertebral height and alignment otherwise appears stable since January. Grossly stable thoracic visceral contours.  IMPRESSION: Mild T6 compression fracture new since January. If specific therapy such as vertebroplasty is desired whole-body bone scan would best confirm acuity.   Electronically Signed   By: Genevie Ann M.D.   On: 10/07/2014 12:25   Dg Lumbar Spine Complete  10/07/2014   CLINICAL DATA:  81 year old male who fell at home. Thoracolumbar pain. Initial encounter.  EXAM: LUMBAR SPINE - COMPLETE 4+ VIEW  COMPARISON:  Thoracic radiographs from today reported separately. CT Abdomen and Pelvis 06/11/2014.  FINDINGS: L1 and L3 compression fractures Re identified. These were present in January. The L1 level demonstrates progressive loss of height, and lucency through the superior endplate is visible. L1 loss of height up to 25%. Less loss of height at L3. Visible lower thoracic levels appear intact. Chronic L5 pars fractures. Stable lumbar vertebral alignment. Grossly intact sacral ala and SI joints.  Extensive Aortoiliac calcified atherosclerosis noted. Re- identified.  IMPRESSION: 1. Subacute progressive appearing L1 compression fracture, was present in January but with increased loss of height. 2. Subacute to early chronic and more stable appearing L3 compression fracture. 3. If specific therapy such as vertebroplasty is desired, whole-body bone scan would best confirm acuity. 4. Chronic L5 pars fractures with stable mild grade 1 anterolisthesis.   Electronically Signed   By: Genevie Ann M.D.   On: 10/07/2014 12:30   Ct Head Wo Contrast  10/07/2014   CLINICAL DATA:  81 year old male post fall 4 days ago (lost balance and fell into door). Complaining of worsening in neck and low back pain. Initial encounter.  EXAM: CT HEAD WITHOUT CONTRAST  CT CERVICAL SPINE WITHOUT CONTRAST  TECHNIQUE: Multidetector CT imaging of the head and  cervical spine was performed following the standard protocol without intravenous contrast. Multiplanar CT image reconstructions of the cervical spine were also generated.  COMPARISON:  06/29/2006 head CT.  FINDINGS: CT HEAD FINDINGS  No skull fracture or intracranial hemorrhage.  Prominent small vessel disease type changes without CT evidence of large acute infarct.  Global atrophy without hydrocephalus.  No intracranial mass lesion noted on this unenhanced exam.  Prominent vascular calcifications  Post lens replacement otherwise orbital structures unremarkable.  Mastoid air cells, middle ear cavities and visualized paranasal sinuses are clear.  CT CERVICAL SPINE FINDINGS  No cervical spine fracture however, small superior endplate compression fracture T1 appears acute. Schmorl's node deformity/ endplate compression fracture T2 and T3 may also be  acute with 10% loss of height but without retropulsion.  Cervical spondylotic changes with various degrees of spinal stenosis and foraminal narrowing most notable C3-4 thru C6-7. Prominent transverse ligament hypertrophy.  No abnormal prevertebral soft tissue swelling.  Prominent vascular calcifications.  IMPRESSION: CT HEAD  No skull fracture or intracranial hemorrhage.  Prominent small vessel disease type changes without CT evidence of large acute infarct.  Global atrophy.  Prominent vascular calcifications  CT CERVICAL SPINE  No cervical spine fracture however, small superior endplate compression fracture T1 appears acute. Schmorl's node deformity/ endplate compression fracture T2 and T3 may also be acute with 10% loss of height but without retropulsion.  Cervical spondylotic changes with various degrees of spinal stenosis and foraminal narrowing most notable C3-4 thru C6-7. Prominent transverse ligament hypertrophy.   Electronically Signed   By: Genia Del M.D.   On: 10/07/2014 12:58   Ct Cervical Spine Wo Contrast  10/07/2014   CLINICAL DATA:  81 year old male  post fall 4 days ago (lost balance and fell into door). Complaining of worsening in neck and low back pain. Initial encounter.  EXAM: CT HEAD WITHOUT CONTRAST  CT CERVICAL SPINE WITHOUT CONTRAST  TECHNIQUE: Multidetector CT imaging of the head and cervical spine was performed following the standard protocol without intravenous contrast. Multiplanar CT image reconstructions of the cervical spine were also generated.  COMPARISON:  06/29/2006 head CT.  FINDINGS: CT HEAD FINDINGS  No skull fracture or intracranial hemorrhage.  Prominent small vessel disease type changes without CT evidence of large acute infarct.  Global atrophy without hydrocephalus.  No intracranial mass lesion noted on this unenhanced exam.  Prominent vascular calcifications  Post lens replacement otherwise orbital structures unremarkable.  Mastoid air cells, middle ear cavities and visualized paranasal sinuses are clear.  CT CERVICAL SPINE FINDINGS  No cervical spine fracture however, small superior endplate compression fracture T1 appears acute. Schmorl's node deformity/ endplate compression fracture T2 and T3 may also be acute with 10% loss of height but without retropulsion.  Cervical spondylotic changes with various degrees of spinal stenosis and foraminal narrowing most notable C3-4 thru C6-7. Prominent transverse ligament hypertrophy.  No abnormal prevertebral soft tissue swelling.  Prominent vascular calcifications.  IMPRESSION: CT HEAD  No skull fracture or intracranial hemorrhage.  Prominent small vessel disease type changes without CT evidence of large acute infarct.  Global atrophy.  Prominent vascular calcifications  CT CERVICAL SPINE  No cervical spine fracture however, small superior endplate compression fracture T1 appears acute. Schmorl's node deformity/ endplate compression fracture T2 and T3 may also be acute with 10% loss of height but without retropulsion.  Cervical spondylotic changes with various degrees of spinal stenosis and  foraminal narrowing most notable C3-4 thru C6-7. Prominent transverse ligament hypertrophy.   Electronically Signed   By: Genia Del M.D.   On: 10/07/2014 12:58       Assessment & Plan:   Problem List Items Addressed This Visit    None    Visit Diagnoses    FTT (failure to thrive) in adult    -  Primary    Relevant Orders    Ambulatory referral to [place]    Gait disorder        Relevant Orders    Ambulatory referral to [place]    Traumatic compression fracture of T1 thoracic vertebra, sequela          Interval reviewed - Assessment of problems as above: Plan:  Stop Valium Minimize temazepam dose (which  is chronic for many years) and hydrocodone (in setting of acute pain fromm fx) Home PT Ensure caloric supplement  Time spent with pt/family today 30 minutes, greater than 50% time spent counseling patient on FTT, AMS related to excessive BZ and EtOH use, fall with subsequent injury (T1 compression fx), depression and medication review. Also review of prior records    Gwendolyn Grant, MD

## 2014-10-14 NOTE — Patient Instructions (Addendum)
It was good to see you today.  We have reviewed your prior records including labs and tests today  Medications reviewed and updated NO MORE DIAZEPAM - ever!!! Limit Hydrocodone to AS NEEDED only for pain - no more than every 6h No other changes recommended at this time.  we'll make referral to physical therapy at home. Our office will contact you regarding appointment(s) once made.  Use Ensure between meals - at least 3 cans every day!  Please schedule followup in 3-4 months, call sooner if problems.  Fall Prevention and Home Safety Falls cause injuries and can affect all age groups. It is possible to use preventive measures to significantly decrease the likelihood of falls. There are many simple measures which can make your home safer and prevent falls. OUTDOORS  Repair cracks and edges of walkways and driveways.  Remove high doorway thresholds.  Trim shrubbery on the main path into your home.  Have good outside lighting.  Clear walkways of tools, rocks, debris, and clutter.  Check that handrails are not broken and are securely fastened. Both sides of steps should have handrails.  Have leaves, snow, and ice cleared regularly.  Use sand or salt on walkways during winter months.  In the garage, clean up grease or oil spills. BATHROOM  Install night lights.  Install grab bars by the toilet and in the tub and shower.  Use non-skid mats or decals in the tub or shower.  Place a plastic non-slip stool in the shower to sit on, if needed.  Keep floors dry and clean up all water on the floor immediately.  Remove soap buildup in the tub or shower on a regular basis.  Secure bath mats with non-slip, double-sided rug tape.  Remove throw rugs and tripping hazards from the floors. BEDROOMS  Install night lights.  Make sure a bedside light is easy to reach.  Do not use oversized bedding.  Keep a telephone by your bedside.  Have a firm chair with side arms to use for  getting dressed.  Remove throw rugs and tripping hazards from the floor. KITCHEN  Keep handles on pots and pans turned toward the center of the stove. Use back burners when possible.  Clean up spills quickly and allow time for drying.  Avoid walking on wet floors.  Avoid hot utensils and knives.  Position shelves so they are not too high or low.  Place commonly used objects within easy reach.  If necessary, use a sturdy step stool with a grab bar when reaching.  Keep electrical cables out of the way.  Do not use floor polish or wax that makes floors slippery. If you must use wax, use non-skid floor wax.  Remove throw rugs and tripping hazards from the floor. STAIRWAYS  Never leave objects on stairs.  Place handrails on both sides of stairways and use them. Fix any loose handrails. Make sure handrails on both sides of the stairways are as long as the stairs.  Check carpeting to make sure it is firmly attached along stairs. Make repairs to worn or loose carpet promptly.  Avoid placing throw rugs at the top or bottom of stairways, or properly secure the rug with carpet tape to prevent slippage. Get rid of throw rugs, if possible.  Have an electrician put in a light switch at the top and bottom of the stairs. OTHER FALL PREVENTION TIPS  Wear low-heel or rubber-soled shoes that are supportive and fit well. Wear closed toe shoes.  When using  a stepladder, make sure it is fully opened and both spreaders are firmly locked. Do not climb a closed stepladder.  Add color or contrast paint or tape to grab bars and handrails in your home. Place contrasting color strips on first and last steps.  Learn and use mobility aids as needed. Install an electrical emergency response system.  Turn on lights to avoid dark areas. Replace light bulbs that burn out immediately. Get light switches that glow.  Arrange furniture to create clear pathways. Keep furniture in the same place.  Firmly  attach carpet with non-skid or double-sided tape.  Eliminate uneven floor surfaces.  Select a carpet pattern that does not visually hide the edge of steps.  Be aware of all pets. OTHER HOME SAFETY TIPS  Set the water temperature for 120 F (48.8 C).  Keep emergency numbers on or near the telephone.  Keep smoke detectors on every level of the home and near sleeping areas. Document Released: 05/06/2002 Document Revised: 11/15/2011 Document Reviewed: 08/05/2011 [facility] Patient Information 2015 [place], [place]. This information is not intended to replace advice given to you by your health care provider. Make sure you discuss any questions you have with your health care provider.

## 2014-10-14 NOTE — Progress Notes (Signed)
Pre visit review using our clinic review tool, if applicable. No additional management support is needed unless otherwise documented below in the visit note. 

## 2014-10-21 ENCOUNTER — Telehealth: Payer: Self-pay | Admitting: Internal Medicine

## 2014-10-21 NOTE — Telephone Encounter (Signed)
Pt called in and needs refill on his HYDROcodone-acetaminophen (NORCO/VICODIN) 5-325 MG per tablet [[id number]] .  Pt son said that pt is only taking 1/2 tab at night before bed   Best number [phone]

## 2014-10-21 NOTE — Telephone Encounter (Signed)
Please advise if refill is appropriate for pt.

## 2014-10-22 MED ORDER — HYDROCODONE-ACETAMINOPHEN 5-325 MG PO TABS: 0.5000 | ORAL_TABLET | Freq: Four times a day (QID) | ORAL | Status: DC | PRN

## 2014-10-22 NOTE — Telephone Encounter (Signed)
Limited supply refilled today for thoracic compression fracture management of pain.  Reviewed [facility] controlled substance registry and only prescription for narcotic has been 15 tablets filled May 6

## 2014-10-23 ENCOUNTER — Encounter (INDEPENDENT_AMBULATORY_CARE_PROVIDER_SITE_OTHER): Payer: Medicare Other | Admitting: Ophthalmology

## 2014-10-23 DIAGNOSIS — H3531 Nonexudative age-related macular degeneration: Secondary | ICD-10-CM

## 2014-10-23 DIAGNOSIS — H43813 Vitreous degeneration, bilateral: Secondary | ICD-10-CM

## 2014-10-23 DIAGNOSIS — H26491 Other secondary cataract, right eye: Secondary | ICD-10-CM | POA: Diagnosis not present

## 2014-11-03 ENCOUNTER — Encounter: Payer: Self-pay | Admitting: Internal Medicine

## 2014-11-03 ENCOUNTER — Ambulatory Visit (INDEPENDENT_AMBULATORY_CARE_PROVIDER_SITE_OTHER): Payer: Medicare Other | Admitting: Internal Medicine

## 2014-11-03 VITALS — BP 110/64 | HR 85 | Temp 98.2°F | Ht 71.0 in | Wt 147.2 lb

## 2014-11-03 DIAGNOSIS — J449 Chronic obstructive pulmonary disease, unspecified: Secondary | ICD-10-CM

## 2014-11-03 DIAGNOSIS — G4700 Insomnia, unspecified: Secondary | ICD-10-CM | POA: Diagnosis not present

## 2014-11-03 DIAGNOSIS — C679 Malignant neoplasm of bladder, unspecified: Secondary | ICD-10-CM | POA: Diagnosis not present

## 2014-11-03 DIAGNOSIS — R627 Adult failure to thrive: Secondary | ICD-10-CM

## 2014-11-03 NOTE — Progress Notes (Signed)
Pre visit review using our clinic review tool, if applicable. No additional management support is needed unless otherwise documented below in the visit note. 

## 2014-11-03 NOTE — Assessment & Plan Note (Signed)
Prefers temazepam to other prior meds, but ineffective for past few months since rehab March 2016 changed therapy to diazepam resulted in complications when patient added diazepam to Restoril rather than discontinuation of first BZ Patient reports discontinuation of all benzodiazepine's in past month since office visit here mid-May Using only low-dose hydrocodone for associated neck pain at night as needed to aid sleep Support and encouragement provided to remain off benzodiazepine due to greater risk than benefit of medication use -patient understands and agrees to same

## 2014-11-03 NOTE — Progress Notes (Signed)
Subjective:    Patient ID: Edward [name], male    DOB: 08-16-1929, 81 y.o.   MRN: [id number]  HPI  Patient here for follow-up. Patient and younger son report symptoms much improved since last visit with reduction in medications, improved efforts on home physical therapy. Pain is controlled with occasional hydrocodone half tablet at night as needed  Past Medical History  Diagnosis Date  . Gout     "only once in my lifetime" (12/18/2012)  . Depression   . PVD (peripheral vascular disease)     s/p B CEA  . COPD (chronic obstructive pulmonary disease)      PFT 6.19.07: FEV1 465  ratio 60 with 25% response to B2.  > PFTs 8.29.07: FEV1 61%  ratio 46% no better after B2.  > add on advair 12.20.11-improved 1.31.12  . AAA (abdominal aortic aneurysm)   . Melanosis coli   . Internal hemorrhoids   . Carotid artery occlusion   . Aortic valve disorder   . Chronic diastolic heart failure 35/09/7320  . CAD (coronary artery disease), native coronary artery     CABG w LIMA to LAD, SVG to dx, OM, RCA 1989 Dr. Arlyce Dice for 3VD PTCA of OM, 1999 and 2000 Cath showed occlusion of left main and RCA with stenosis in OM Redo redo CABG w SVG to OM, SVG to RCA8/10/00 Dr. Cyndia Bent   . Hyperlipidemia   . BPH (benign prostatic hypertrophy)   . Hypertensive heart disease     Change toprol to bisoprolol 10 mg daily on trial basis  05/21/2012  - [facility] non adherent 07/17/2012    . LBBB (left bundle branch block)   . Second degree heart block 12/18/2012    s/p MDT Adapta L pacemaker 12-20-2012 by Dr Caryl Comes  . Shingles years ago    mild  . Abrasion of left forearm dec 2015    small scraped area healing  . Chronic kidney disease stage III (GFR 30-59 ml/min)     saw dr Justin Mend oct 2015 and released by dr webb  . Congenital absence of kidney     "noted during AAA repair; never knew it before" (12/18/2012)  . Cancer 05/2014    bladder, s/p uro surg resection  . Myocardial infarction 1979    Review of Systems    Constitutional: Negative for fatigue and unexpected weight change.  Respiratory: Negative for cough and shortness of breath.   Cardiovascular: Negative for chest pain. Leg swelling: occ in L>R LE, uses Lasix for same.  Neurological: Negative for weakness.  Psychiatric/Behavioral: Negative for suicidal ideas, sleep disturbance, self-injury and dysphoric mood.       Objective:    Physical Exam  Constitutional: He appears well-developed and well-nourished. No distress.  Younger son at side  Cardiovascular: Normal rate, regular rhythm and normal heart sounds.   No murmur heard. Pulmonary/Chest: Effort normal and breath sounds normal. No respiratory distress.  Psychiatric: He has a normal mood and affect. His behavior is normal. Thought content normal.    BP 110/64 mmHg  Pulse 85  Temp(Src) 98.2 F (36.8 C) (Oral)  Ht 5\' 11"  (1.803 m)  Wt 147 lb 4 oz (66.792 kg)  BMI 20.55 kg/m2  SpO2 96% Wt Readings from Last 3 Encounters:  11/03/14 147 lb 4 oz (66.792 kg)  10/14/14 149 lb 4 oz (67.699 kg)  09/26/14 150 lb (68.04 kg)     Lab Results  Component Value Date   WBC 7.8 08/12/2014  HGB 10.8* 08/12/2014   HCT 33.0* 08/12/2014   PLT 266.0 08/12/2014   GLUCOSE 111* 08/12/2014   CHOL 122 10/30/2013   TRIG 95.0 10/30/2013   HDL 50.30 10/30/2013   LDLCALC 53 10/30/2013   ALT 17 06/18/2014   AST 34 06/18/2014   NA 135 08/12/2014   K 4.3 08/12/2014   CL 103 08/12/2014   CREATININE 1.55* 08/12/2014   BUN 24* 08/12/2014   CO2 30 08/12/2014   TSH 2.63 10/30/2013   INR 2.09* 09/26/2014    Dg Thoracic Spine 2 View  10/07/2014   CLINICAL DATA:  81 year old male who fell at home. Thoracolumbar pain. Initial encounter.  EXAM: THORACIC SPINE - 2 VIEW  COMPARISON:  Chest radiographs 06/24/2014 and earlier.  FINDINGS: Partially visible chronic cardiac pacemaker. Sequelae of CABG. Surgical clips Re identified in the bilateral neck.  Osteopenia. Thoracic segmentation appears to be  normal. Mild Concave deformity of the T6 superior endplate is new. Thoracic vertebral height and alignment otherwise appears stable since January. Grossly stable thoracic visceral contours.  IMPRESSION: Mild T6 compression fracture new since January. If specific therapy such as vertebroplasty is desired whole-body bone scan would best confirm acuity.   Electronically Signed   By: Genevie Ann M.D.   On: 10/07/2014 12:25   Dg Lumbar Spine Complete  10/07/2014   CLINICAL DATA:  81 year old male who fell at home. Thoracolumbar pain. Initial encounter.  EXAM: LUMBAR SPINE - COMPLETE 4+ VIEW  COMPARISON:  Thoracic radiographs from today reported separately. CT Abdomen and Pelvis 06/11/2014.  FINDINGS: L1 and L3 compression fractures Re identified. These were present in January. The L1 level demonstrates progressive loss of height, and lucency through the superior endplate is visible. L1 loss of height up to 25%. Less loss of height at L3. Visible lower thoracic levels appear intact. Chronic L5 pars fractures. Stable lumbar vertebral alignment. Grossly intact sacral ala and SI joints.  Extensive Aortoiliac calcified atherosclerosis noted. Re- identified.  IMPRESSION: 1. Subacute progressive appearing L1 compression fracture, was present in January but with increased loss of height. 2. Subacute to early chronic and more stable appearing L3 compression fracture. 3. If specific therapy such as vertebroplasty is desired, whole-body bone scan would best confirm acuity. 4. Chronic L5 pars fractures with stable mild grade 1 anterolisthesis.   Electronically Signed   By: Genevie Ann M.D.   On: 10/07/2014 12:30   Ct Head Wo Contrast  10/07/2014   CLINICAL DATA:  81 year old male post fall 4 days ago (lost balance and fell into door). Complaining of worsening in neck and low back pain. Initial encounter.  EXAM: CT HEAD WITHOUT CONTRAST  CT CERVICAL SPINE WITHOUT CONTRAST  TECHNIQUE: Multidetector CT imaging of the head and cervical  spine was performed following the standard protocol without intravenous contrast. Multiplanar CT image reconstructions of the cervical spine were also generated.  COMPARISON:  06/29/2006 head CT.  FINDINGS: CT HEAD FINDINGS  No skull fracture or intracranial hemorrhage.  Prominent small vessel disease type changes without CT evidence of large acute infarct.  Global atrophy without hydrocephalus.  No intracranial mass lesion noted on this unenhanced exam.  Prominent vascular calcifications  Post lens replacement otherwise orbital structures unremarkable.  Mastoid air cells, middle ear cavities and visualized paranasal sinuses are clear.  CT CERVICAL SPINE FINDINGS  No cervical spine fracture however, small superior endplate compression fracture T1 appears acute. Schmorl's node deformity/ endplate compression fracture T2 and T3 may also be acute with 10% loss of  height but without retropulsion.  Cervical spondylotic changes with various degrees of spinal stenosis and foraminal narrowing most notable C3-4 thru C6-7. Prominent transverse ligament hypertrophy.  No abnormal prevertebral soft tissue swelling.  Prominent vascular calcifications.  IMPRESSION: CT HEAD  No skull fracture or intracranial hemorrhage.  Prominent small vessel disease type changes without CT evidence of large acute infarct.  Global atrophy.  Prominent vascular calcifications  CT CERVICAL SPINE  No cervical spine fracture however, small superior endplate compression fracture T1 appears acute. Schmorl's node deformity/ endplate compression fracture T2 and T3 may also be acute with 10% loss of height but without retropulsion.  Cervical spondylotic changes with various degrees of spinal stenosis and foraminal narrowing most notable C3-4 thru C6-7. Prominent transverse ligament hypertrophy.   Electronically Signed   By: Genia Del M.D.   On: 10/07/2014 12:58   Ct Cervical Spine Wo Contrast  10/07/2014   CLINICAL DATA:  81 year old male post fall 4  days ago (lost balance and fell into door). Complaining of worsening in neck and low back pain. Initial encounter.  EXAM: CT HEAD WITHOUT CONTRAST  CT CERVICAL SPINE WITHOUT CONTRAST  TECHNIQUE: Multidetector CT imaging of the head and cervical spine was performed following the standard protocol without intravenous contrast. Multiplanar CT image reconstructions of the cervical spine were also generated.  COMPARISON:  06/29/2006 head CT.  FINDINGS: CT HEAD FINDINGS  No skull fracture or intracranial hemorrhage.  Prominent small vessel disease type changes without CT evidence of large acute infarct.  Global atrophy without hydrocephalus.  No intracranial mass lesion noted on this unenhanced exam.  Prominent vascular calcifications  Post lens replacement otherwise orbital structures unremarkable.  Mastoid air cells, middle ear cavities and visualized paranasal sinuses are clear.  CT CERVICAL SPINE FINDINGS  No cervical spine fracture however, small superior endplate compression fracture T1 appears acute. Schmorl's node deformity/ endplate compression fracture T2 and T3 may also be acute with 10% loss of height but without retropulsion.  Cervical spondylotic changes with various degrees of spinal stenosis and foraminal narrowing most notable C3-4 thru C6-7. Prominent transverse ligament hypertrophy.  No abnormal prevertebral soft tissue swelling.  Prominent vascular calcifications.  IMPRESSION: CT HEAD  No skull fracture or intracranial hemorrhage.  Prominent small vessel disease type changes without CT evidence of large acute infarct.  Global atrophy.  Prominent vascular calcifications  CT CERVICAL SPINE  No cervical spine fracture however, small superior endplate compression fracture T1 appears acute. Schmorl's node deformity/ endplate compression fracture T2 and T3 may also be acute with 10% loss of height but without retropulsion.  Cervical spondylotic changes with various degrees of spinal stenosis and foraminal  narrowing most notable C3-4 thru C6-7. Prominent transverse ligament hypertrophy.   Electronically Signed   By: Genia Del M.D.   On: 10/07/2014 12:58       Assessment & Plan:   FTT, adult - see prior OV on same. - with conscious effort on patient behalf, son's support and intervention, and reduced medications - no BZs and min hydrocodone for pain qhs. Prn -overall outlook is much improved and patient is progressing towards state of health prior to decline in past several months. Continue supportive care with follow-up in 3-4 months, patient call sooner if needed  Problem List Items Addressed This Visit    Cancer of bladder wall (Chronic)    S/p TU resection 06/14/14 - OP note reviewed Post op course complicated by PNA Then s/p SNF rehab, home early March 2016 Struggling  with failure to thrive initially, but has now improved OP follow up with uro to continue BCG as planned      COPD GOLD II/III    Follows with pulmonary for same, last office visit spring 2016 reviewed Current symptoms controlled The current medical regimen is effective;  continue present plan and medications.       INSOMNIA, CHRONIC    Prefers temazepam to other prior meds, but ineffective for past few months since rehab March 2016 changed therapy to diazepam resulted in complications when patient added diazepam to Restoril rather than discontinuation of first BZ Patient reports discontinuation of all benzodiazepine's in past month since office visit here mid-May Using only low-dose hydrocodone for associated neck pain at night as needed to aid sleep Support and encouragement provided to remain off benzodiazepine due to greater risk than benefit of medication use -patient understands and agrees to same        Other Visit Diagnoses    FTT (failure to thrive) in adult    -  Primary        Gwendolyn Grant, MD

## 2014-11-03 NOTE — Assessment & Plan Note (Signed)
S/p TU resection 06/14/14 - OP note reviewed Post op course complicated by PNA Then s/p SNF rehab, home early March 2016 Struggling with failure to thrive initially, but has now improved OP follow up with uro to continue BCG as planned

## 2014-11-03 NOTE — Assessment & Plan Note (Signed)
Follows with pulmonary for same, last office visit spring 2016 reviewed Current symptoms controlled The current medical regimen is effective;  continue present plan and medications.

## 2014-11-03 NOTE — Patient Instructions (Addendum)
It was good to see you today.  We have reviewed your prior records including labs and tests today  Medications reviewed and updated, no changes recommended at this time. Refill on medication(s) as discussed today.  Please schedule followup in 3-4 months, call sooner if problems.   Failure to Thrive, Adult Adult failure to thrive is a condition that some older people develop. People with this condition are able to do fewer and fewer activities over time. They may lose interest in being with friends or may not want to eat or drink. This is not a normal part of aging. Many things can cause this. Health problems, long-term disease, depression, bad eating habits, cognitive impairment, or disability may play a role in the development of this condition. Most of the time, it is important to treat whatever is causing failure to thrive. Sometimes, though, it might not be possible to treat the condition. The person could be nearing the end of life. Then, treatment could make the person suffer longer. CAUSES Sometimes, no specific cause can be found. Factors that have been linked to failure to thrive include:  Diseases and medical conditions, such as:  Cancer.  Diabetes.  Stomach and intestinal (gastrointestinal) problems.  Lung disease.  Liver disease.  Kidney disease.  Heart problems.  Thyroid disease.  Neurologic problems.  Vitamin deficiencies.  Disability. This may be the result of:  A broken hip.  A stroke.  Very bad arthritis.  Infections that last a long time.  A long recovery from a surgery.  Mental health issues.  Medicines.  Eating problems.  Medicine for certain conditions. These conditions include:  Parkinson's disease.  Seizure disorder.  Anxiety.  Pain.  High blood pressure.  Depression.  Infections. SYMPTOMS  Losing weight (more than 5% of total body weight).  Getting more tired than usual after an activity.  Having trouble getting up  after sitting.  Not being hungry or thirsty.  Not getting out of bed.  Not wanting to do usual activities.  Being depressed.  Getting infections often.  Having bedsores.  Taking a long time to recover after an injury or a surgery.  Weakness. DIAGNOSIS A physical exam can help a caregiver decide if someone has adult failure to thrive. This may include questions about the person's health presently and in the past. It also may include questions about behavior and mood, such as:  Has activity changed?  Does the person seem sad?  Are eating habits different? The caregiver may ask for a list of all medicines taken because certain medicines can lead to this condition. The list should include prescription and over-the-counter medicines. The caregiver will likely order some tests. These may include:  Blood tests to check for infection, certain diseases, deficiencies, hormone levels, malnutrition, or dehydration.  Urine tests to check for urinary tract infection or kidney failure.  Imaging tests. Examples are an X-ray, a computed tomography (CT) scan, and magnetic resonance imaging (MRI).  Hearing tests.  Vision tests.  Cognitivetests to check thinking ability.  Activity tests to see if the person can do basic tasks like bathing and dressing. There also are tests to check if someone can shop, cook, or move around safely. The caregiver will check if the person is eating enough healthy food. This may include:  Checking weight.  Having blood tested for cholesterol and protein levels.  Seeing if anything else might be making it hard to eat (tooth problems, poorly fitted dentures, trouble swallowing). The person may need to see  a specialist to help with diagnosis or treatment. These specialists may include a speech therapist, physical therapist, occupational therapist, dietitian, or social worker.  TREATMENT Treatment for adult failure to thrive depends on the cause. Caregivers  also must decide if a treatment has a good chance of working. It often takes a team of caregivers to find the right treatment. Options may include:  Treatments to cure a disease that can cause adult failure to thrive.  Talk therapy or medicine to treat depression.  A better diet. Eating more often, adding nutritional supplements between meals, or taking vitamins may be suggested. Sometimes, medicine is prescribed to boost appetite.  Medicine changes or stopping a medicine.  Physical therapy.  Moving to a place that offers more aid. HOME CARE INSTRUCTIONS What needs to be done at home varies from person to person. This will depend on what caused the condition and how it is treated. However, basic guidelines include:  Taking any medicine prescribed by the caregiver. Following the directions carefully is important.  Eating healthy foods. There should be enough calories in each meal. Ask the caregiver if vitamins or nutritional supplements should be taken between meals. Consider talking with a dietitian.  Exercising. Strength training is important. A physical therapist can help set up an exercise program that fits the person.  Making sure the person is safe at home.  Talking with caregivers about what should be done if the person can no longer make decisions for himself or herself. SEEK MEDICAL CARE IF:  There are any questions about medicines.  There are questions about the effects of treatment.  The person is not able to eat well.  The person is not able to move around.  The person feels very sad or hopeless. SEEK IMMEDIATE MEDICAL CARE IF:   The person has thoughts of ending his or her life.  The person cannot eat or drink.  The person does not get out of bed.  Staying at home is no longer safe.  The person has a fever. Document Released: 08/08/2011 Document Reviewed: 08/08/2011 [facility] Patient Information 2015 [place]. This information is not intended to  replace advice given to you by your health care provider. Make sure you discuss any questions you have with your health care provider.

## 2014-11-19 ENCOUNTER — Other Ambulatory Visit: Payer: Self-pay | Admitting: Internal Medicine

## 2014-11-19 NOTE — Telephone Encounter (Signed)
Please advise, thanks---dr leschber not returning until 6/27--thanks

## 2014-11-20 ENCOUNTER — Ambulatory Visit (INDEPENDENT_AMBULATORY_CARE_PROVIDER_SITE_OTHER): Payer: Medicare Other | Admitting: Ophthalmology

## 2014-11-20 NOTE — Telephone Encounter (Signed)
Printed and signed.  

## 2014-11-25 ENCOUNTER — Telehealth: Payer: Self-pay | Admitting: Internal Medicine

## 2014-11-25 NOTE — Telephone Encounter (Signed)
Pt request refill for DULERA 100-5 MCG/ACT AERO to be send to [facility]. Please help

## 2014-11-26 ENCOUNTER — Telehealth: Payer: Self-pay | Admitting: Internal Medicine

## 2014-11-26 MED ORDER — TIOTROPIUM BROMIDE MONOHYDRATE 18 MCG IN CAPS: 18.0000 ug | ORAL_CAPSULE | Freq: Every day | RESPIRATORY_TRACT | Status: DC

## 2014-11-26 MED ORDER — DULERA 100-5 MCG/ACT IN AERO: 2.0000 | INHALATION_SPRAY | Freq: Two times a day (BID) | RESPIRATORY_TRACT | Status: DC

## 2014-11-26 MED ORDER — DULERA 100-5 MCG/ACT IN AERO: 2.0000 | INHALATION_SPRAY | Freq: Two times a day (BID) | RESPIRATORY_TRACT | Status: AC

## 2014-11-26 NOTE — Telephone Encounter (Signed)
Spoke with pt,advised that we do not have samples of Dulera at this time.   Refilled dulera and spiriva at pt's request to mail order pharmacy for 90 day supply.  Nothing further needed.

## 2014-12-08 ENCOUNTER — Encounter: Payer: Self-pay | Admitting: Cardiology

## 2014-12-08 DIAGNOSIS — I482 Chronic atrial fibrillation, unspecified: Secondary | ICD-10-CM

## 2014-12-08 NOTE — Progress Notes (Signed)
Patient ID: Edward [name], male   DOB: 1930/02/08, 81 y.o.   MRN: [id number]  Edward [name]  Date of visit:  12/08/2014 DOB:  06/21/1929    Age:  81 yrs. Medical record number:  [id number]     Account number:  [id number] Primary Care Provider: [facility] ____________________________ CURRENT DIAGNOSES  1. Persistent atrial fibrillation  2. Dyspnea, unspecified  3. Chronic kidney disease, stage 3 (moderate)  4. Chronic diastolic heart failure  5. Peripheral vascular disease, unspecified  6. CAD Native without angina  7. Presence of cardiac pacemaker  8. Nonrheumatic aortic (valve) stenosis  9. Chronic obstructive pulmonary disease, unspecified  10. Essential (primary) hypertension  11. Long term (current) use of anticoagulants  12. Left bundle-branch block  13. Hyperlipidemia  14. Malignant Neoplasm Of Bladder, Unspecified  15. Old myocardial infarction  49. Chronic atrial fibrillation  17. Idiopathic Gout, Unspecified Site  18. Occlusion and stenosis of bilateral carotid arteries  19. Presence of aortocoronary bypass graft ____________________________ ALLERGIES  Ace Inhibitors, Dry cough  Clarithromycin, Intolerance-unknown  Codeine, Intolerance-unknown ____________________________ MEDICATIONS  1. Spiriva with HandiHaler 18 mcg capsule, w/inhalation device, 1 p.o. daily  2. Colcrys 0.6 mg tablet, PRN  3. Dulera 200-5 mcg/actuation HFA aerosol inhaler, 2 puff bid  4. warfarin 1 mg tablet, 1 qd or as directed  5. nitroglycerin 0.4 mg sublingual tablet, PRN  6. Crestor 20 mg tablet, 1 p.o. daily  7. furosemide 40 mg tablet, 3 x week ____________________________ CHIEF COMPLAINTS  Followup of Chronic diastolic heart failure ____________________________ HISTORY OF PRESENT ILLNESS Patient seen for cardiac followup. He remains in atrial fibrillation at this time. He has lost 15 pounds of weight since he was here and really doesn't have a good home situation. He complains of  insomnia and difficulty sleeping at night. He is only taking his Lasix intermittently and has edema noted on examination today. He says that he has a good appetite and is talking about going to the cafeteria when he leaves the office however. His warfarin has been difficult to assess and there is concern about whether he is taking his medicines properly at home. ____________________________ PAST HISTORY  Past Medical Illnesses:  hypertension, hyperlipidemia, peripheral vascular disease, COPD, history of shingles, gout, GERD;  Cardiovascular Illnesses:  diastolic CHF, CAD, S/P MI-inferior, conduction disorder-LBBB, second degree heart block, atrial fibrillation;  Surgical Procedures:  redo CABG w SVG to OM, SVG to RCA8/10/00 Dr. Cyndia Bent, CABG w LIMA to LAD, SVG to dx, OM, RCA 1989 Dr. Arlyce Dice, carotid endarterectomy-bil, cholecystectomy, hemorrhoidectomy, L carotid subclavian bypass, repair of anal fissure, AAA repair, R Shoulder surgery x2;  NYHA Classification:  II;  Canadian Angina Classification:  Class 0: Asymptomatic;  Cardiology Procedures-Invasive:  cardiac cath (left) 2000, cardioversion August 2015, cardioversion April 2016;  Cardiology Procedures-Noninvasive:  adenosine cardiolte March 2008, treadmill cardiolite June 2011, echocardiogram June 2014, echocardiogram July 2015;  Cardiac Cath Results:  normal Left main, occluded LAD, occluded CFX, occluded RCA, occluded Diag 1 SVG, occluded RCA SVG, 99% stenosis CFX SVG, widely patent LAD LIMA graft;  LVEF of 45% documented via echocardiogram on 06/19/2014,   ____________________________ CARDIO-PULMONARY TEST DATES EKG Date:  12/08/2014;   Cardiac Cath Date:  05/10/2007;  CABG: 01/07/1999;  Nuclear Study Date:  11/26/2009;  Echocardiography Date: 06/19/2014;  Chest Xray Date: 11/18/2013;   ____________________________ FAMILY HISTORY Brother -- Brother dead, Diabetes mellitus Brother -- Brother alive and well Father -- Father dead, Leukemia Mother --  Mother dead, Coronary Artery Disease Sister --  Sister alive and well Sister -- Sister dead Sister -- Sister alive with problem, Cancer Sister -- Sister alive and well Sister -- Sister dead, Cancer Sister -- Coronary Artery Disease ____________________________ SOCIAL HISTORY Alcohol Use:  beer;  Smoking:  used to smoke but quit 1998, greater than 50 pack year history;  Diet:  regular diet without modifications;  Lifestyle:  widower;  Exercise:  exercise is limited due to physical disability;  Occupation:  retired Astronomer;  Residence:  lives with son;   ____________________________ REVIEW OF SYSTEMS General:  malaise and fatigue, weight loss of approximately 15 lbs Eyes: wears eye glasses/contact lenses, cataracts Respiratory: dyspnea with exertion Cardiovascular:  please review HPI Abdominal: denies dyspepsia, GI bleeding, constipation, or diarrhea Musculoskeletal:  edema Neurological:  recent fall  ____________________________ PHYSICAL EXAMINATION VITAL SIGNS  Blood Pressure:  92/50 Sitting, Left arm, regular cuff  , 90/46 Standing, Left arm and regular cuff   Pulse:  86/min. Weight:  132.00 lbs. Height:  70"BMI: 19  Constitutional:  pleasant white male in no acute distress, disheveled Skin:  warm and dry to touch, no apparent skin lesions, or masses noted. Head:  normocephalic, normal hair pattern, no masses or tenderness ENT:  ears, nose and throat reveal no gross abnormalities.  Dentition good. Neck:  bilateral carotid endarterectomy scars, no JVD, no bruits, no masses, non-tender Chest:  healed median sternotomy scar, healed pacemaker incision in the left pectoral area, fine rales both bases Cardiac:  regular rhythm, normal S1 and S2, no S3 or S4, grade 1/6 systolic murmur at aortic area radiating to neck Peripheral Pulses:  bilateral femoral bruits present, femoral pulses 2+, dorsalis pedis pulses diminished, posterior tibial pulses diminished Extremities & Back:  2+ edema, well  healed saphenous vein donor site RLE, well healed saphenous vein donor site LLE Neurological:  no gross motor or sensory deficits noted, affect appropriate, oriented x3. ____________________________ MOST RECENT LIPID PANEL 11/18/13  CHOL TOTL 119 mg/dl, LDL 57 NM, HDL 47 mg/dl, TRIGLYCER 74 mg/dl and CHOL/HDL 2.5 (Calc) ____________________________ IMPRESSIONS/PLAN  1. Failure to thrive with a 15 pound weight loss since he was here 2. Chronic diastolic heart failure 3. Persistent atrial fibrillation 4. Long-term use of anticoagulants with over anticoagulation today 5. Coronary artery disease with previous bypass grafting 6. COPD  Recommendations:  Past and get back in touch with his primary doctor. Concerned about his weight loss and that his home situation may not be adequate. He should discontinue amiodarone at this time. Follow back up in 6 weeks. Cement was drawn today. He should take Lasix on a daily basis until the edema is gone. ____________________________ TODAYS ORDERS  1. Comprehensive Metabolic Panel: Today  2. Return Visit: 6 weeks  3. Coag Clinic Visit: Coag OV 7 days  4. 12 Lead EKG: Today                       ____________________________ Cardiology Physician:  Kerry Hough MD [facility]

## 2014-12-11 ENCOUNTER — Other Ambulatory Visit (INDEPENDENT_AMBULATORY_CARE_PROVIDER_SITE_OTHER): Payer: Medicare Other

## 2014-12-11 ENCOUNTER — Ambulatory Visit (INDEPENDENT_AMBULATORY_CARE_PROVIDER_SITE_OTHER): Payer: Medicare Other | Admitting: Internal Medicine

## 2014-12-11 VITALS — BP 112/70 | HR 75 | Temp 98.1°F | Resp 16 | Wt 130.0 lb

## 2014-12-11 DIAGNOSIS — D649 Anemia, unspecified: Secondary | ICD-10-CM

## 2014-12-11 DIAGNOSIS — R634 Abnormal weight loss: Secondary | ICD-10-CM

## 2014-12-11 DIAGNOSIS — N289 Disorder of kidney and ureter, unspecified: Secondary | ICD-10-CM | POA: Diagnosis not present

## 2014-12-11 DIAGNOSIS — G479 Sleep disorder, unspecified: Secondary | ICD-10-CM

## 2014-12-11 LAB — CBC WITH DIFFERENTIAL/PLATELET
BASOS ABS: 0 10*3/uL (ref 0.0–0.1)
BASOS PCT: 0.5 % (ref 0.0–3.0)
Eosinophils Absolute: 0.2 10*3/uL (ref 0.0–0.7)
Eosinophils Relative: 3.2 % (ref 0.0–5.0)
HCT: 34.9 % — ABNORMAL LOW (ref 39.0–52.0)
HEMOGLOBIN: 11.3 g/dL — AB (ref 13.0–17.0)
Lymphocytes Relative: 13.1 % (ref 12.0–46.0)
Lymphs Abs: 0.9 10*3/uL (ref 0.7–4.0)
MCHC: 32.4 g/dL (ref 30.0–36.0)
MCV: 82.3 fl (ref 78.0–100.0)
MONO ABS: 0.8 10*3/uL (ref 0.1–1.0)
Monocytes Relative: 12.5 % — ABNORMAL HIGH (ref 3.0–12.0)
NEUTROS PCT: 70.7 % (ref 43.0–77.0)
Neutro Abs: 4.7 10*3/uL (ref 1.4–7.7)
PLATELETS: 239 10*3/uL (ref 150.0–400.0)
RBC: 4.24 Mil/uL (ref 4.22–5.81)
RDW: 20.2 % — ABNORMAL HIGH (ref 11.5–15.5)
WBC: 6.6 10*3/uL (ref 4.0–10.5)

## 2014-12-11 LAB — HEPATIC FUNCTION PANEL
ALT: 28 U/L (ref 0–53)
AST: 29 U/L (ref 0–37)
Albumin: 3.2 g/dL — ABNORMAL LOW (ref 3.5–5.2)
Alkaline Phosphatase: 123 U/L — ABNORMAL HIGH (ref 39–117)
Bilirubin, Direct: 0.6 mg/dL — ABNORMAL HIGH (ref 0.0–0.3)
Total Bilirubin: 1.3 mg/dL — ABNORMAL HIGH (ref 0.2–1.2)
Total Protein: 7 g/dL (ref 6.0–8.3)

## 2014-12-11 LAB — BASIC METABOLIC PANEL
BUN: 31 mg/dL — ABNORMAL HIGH (ref 6–23)
CO2: 31 mEq/L (ref 19–32)
CREATININE: 1.61 mg/dL — AB (ref 0.40–1.50)
Calcium: 9 mg/dL (ref 8.4–10.5)
Chloride: 99 mEq/L (ref 96–112)
GFR: 43.74 mL/min — AB (ref >60.00)
Glucose, Bld: 96 mg/dL (ref 70–99)
POTASSIUM: 4.1 meq/L (ref 3.5–5.1)
Sodium: 135 mEq/L (ref 135–145)

## 2014-12-11 LAB — T3, FREE: T3, Free: 2.4 pg/mL (ref 2.3–4.2)

## 2014-12-11 LAB — TSH: TSH: 2.3 u[IU]/mL (ref 0.35–4.50)

## 2014-12-11 LAB — T4, FREE: Free T4: 1.67 ng/dL — ABNORMAL HIGH (ref 0.60–1.60)

## 2014-12-11 NOTE — Progress Notes (Signed)
Pre visit review using our clinic review tool, if applicable. No additional management support is needed unless otherwise documented below in the visit note. 

## 2014-12-11 NOTE — Progress Notes (Signed)
   Subjective:    Patient ID: Edward [name], male    DOB: May 23, 1930, 81 y.o.   MRN: [id number]  HPI  He is sleeping less than hour a night by his report, describing  tossing and turning all night. He has had a beer the last 2 nights in an attempt to induce sleep. He avoids stimulants. He lives alone and has no reported snoring to excess or apnea.  His appetite is poor and he describes early satiety. He is drinking one Boost a day.  He has weakness in his legs and must hold on to furniture when he moves about his home. He had been seen in May in the context of falling. At that time he was taking Diazepam , possibly with excess alcohol.  The chart documents that he has lost 17 pounds in the last month with weight loss prior to that as well. His wife died in 2008-09-04 ; but he denies depression.   Labs done in 09-05-22 of this year revealed creatinine of 1.55. Hemoglobin was 10.8 and hematocrit 33.   Review of Systems  There is no significant cough, sputum production, wheezing,or  paroxysmal nocturnal dyspnea. Abdominal pain, significant dyspepsia, dysphagia, melena, rectal bleeding, or persistently small caliber stools are denied. No fever , chills , or sweats reported.     Objective:   Physical Exam  Pertinent or positive findings include: Despite the documented weight loss; he looks dramatically better than when last seen in Pattern baldness is present. He has an upper plate and lower partial. There is an S4 slurring versus a flow murmur. The second heart sound is increased. Thoracic spine curvature is accentuated. Breath sounds are decreased. He has crepitus of the knees greater on the right than the left. Pedal pulses are decreased. He is trace-1/2+ edema. He gave the date as July 9, ? 2016. He was able to identify the President. He knew that the governor of [place] had been the [place] but could not give his name.  General appearance :Thin but adequately nourished; in no  distress.  Eyes: No conjunctival inflammation or scleral icterus is present.  Oral exam:  Lips and gums are healthy appearing.There is no oropharyngeal erythema or exudate noted.   Heart:  Normal rate and regular rhythm. S1 and S2 normal without gallop, click, rub or other extra sounds    Lungs:Chest clear to auscultation; no wheezes, rhonchi,rales ,or rubs present.No increased work of breathing.   Abdomen: bowel sounds normal, soft and non-tender without masses, organomegaly or hernias noted.  No guarding or rebound.   Vascular : all pulses equal ; no bruits present.  Skin:Warm & dry.  Intact without suspicious lesions or rashes ; no tenting or jaundice   Lymphatic: No lymphadenopathy is noted about the head, neck, axilla   Neuro: Strength, tone generally decreased       Assessment & Plan:  #1 chronic sleep disorder  #2 dramatic weight loss  #3 renal insufficiency  #4 anemia  See orders and recommendations

## 2014-12-11 NOTE — Patient Instructions (Signed)
To prevent sleep dysfunction follow these instructions for sleep hygiene. Do not read, watch TV, or eat in bed. Do not get into bed until you are ready to turn off the light &  to go to sleep. Do not ingest stimulants ( decongestants, diet pills, nicotine, caffeine) after the evening meal.Do not take daytime naps.Cardiovascular exercise, this can be as simple a program as walking, is recommended 30-45 minutes 3-4 times per week. If you're not exercising you should take 6-8 weeks to build up to this level.  Your next office appointment will be determined based upon review of your pending labs . Those written interpretation of the lab results and instructions will be transmitted to you by mail for your records.  Critical results will be called.   Followup as needed for any active or acute issue. Please report any significant change in your symptoms.

## 2014-12-12 ENCOUNTER — Encounter: Payer: Self-pay | Admitting: Internal Medicine

## 2014-12-14 ENCOUNTER — Other Ambulatory Visit: Payer: Self-pay | Admitting: Internal Medicine

## 2014-12-14 DIAGNOSIS — R7989 Other specified abnormal findings of blood chemistry: Secondary | ICD-10-CM

## 2014-12-31 ENCOUNTER — Encounter: Payer: Self-pay | Admitting: Internal Medicine

## 2014-12-31 ENCOUNTER — Ambulatory Visit (INDEPENDENT_AMBULATORY_CARE_PROVIDER_SITE_OTHER): Payer: Medicare Other | Admitting: Internal Medicine

## 2014-12-31 VITALS — BP 104/64 | HR 74 | Ht 71.0 in | Wt 128.6 lb

## 2014-12-31 DIAGNOSIS — I481 Persistent atrial fibrillation: Secondary | ICD-10-CM

## 2014-12-31 DIAGNOSIS — Z45018 Encounter for adjustment and management of other part of cardiac pacemaker: Secondary | ICD-10-CM

## 2014-12-31 DIAGNOSIS — I4892 Unspecified atrial flutter: Secondary | ICD-10-CM

## 2014-12-31 DIAGNOSIS — I442 Atrioventricular block, complete: Secondary | ICD-10-CM

## 2014-12-31 DIAGNOSIS — I4819 Other persistent atrial fibrillation: Secondary | ICD-10-CM

## 2014-12-31 LAB — CUP PACEART INCLINIC DEVICE CHECK
Battery Impedance: 177 Ohm
Battery Remaining Longevity: 114 mo
Battery Voltage: 2.78 V
Brady Statistic RV Percent Paced: 94 %
Lead Channel Sensing Intrinsic Amplitude: 2 mV
Lead Channel Setting Pacing Amplitude: 2.5 V
Lead Channel Setting Pacing Pulse Width: 0.4 ms
Lead Channel Setting Sensing Sensitivity: 4 mV
MDC IDC MSMT LEADCHNL RA IMPEDANCE VALUE: 490 Ohm
MDC IDC MSMT LEADCHNL RV IMPEDANCE VALUE: 552 Ohm
MDC IDC MSMT LEADCHNL RV PACING THRESHOLD AMPLITUDE: 1 V
MDC IDC MSMT LEADCHNL RV PACING THRESHOLD PULSEWIDTH: 0.4 ms
MDC IDC SESS DTM: 20160803160601

## 2014-12-31 NOTE — Progress Notes (Signed)
Patient Care Team: Rowe Clack, MD as PCP - General Tanda Rockers, MD as Consulting Physician (Pulmonary Disease) Jacolyn Reedy, MD as Consulting Physician (Cardiology) Ladene Artist, MD as Consulting Physician (Gastroenterology) Franchot Gallo, MD (Urology)   HPI  Edward [name] is a 81 y.o. male Seen in followup for pacemaker implanted 7/14 for intermittent high-grade heart block without a reversible cause.  He has a history of ischemic heart disease with prior bypass surgery  He is much better following pacemaker.   He has been identified as having atrial fibrillation and amiodarone had been initiated with anticoagulation. However, atrial arrhythmias have become permanent as amiodarone has been intercurrently discontinued he remains on Coumadin    Past Medical History  Diagnosis Date  . Gout     "only once in my lifetime" (12/18/2012)  . Depression   . PVD (peripheral vascular disease)     s/p B CEA  . COPD (chronic obstructive pulmonary disease)      PFT 6.19.07: FEV1 465  ratio 60 with 25% response to B2.  > PFTs 8.29.07: FEV1 61%  ratio 46% no better after B2.  > add on advair 12.20.11-improved 1.31.12  . AAA (abdominal aortic aneurysm)   . Melanosis coli   . Internal hemorrhoids   . Carotid artery occlusion   . Aortic valve disorder   . Chronic diastolic heart failure 19/07/7900  . CAD (coronary artery disease), native coronary artery     CABG w LIMA to LAD, SVG to dx, OM, RCA 1989 Dr. Arlyce Dice for 3VD PTCA of OM, 1999 and 2000 Cath showed occlusion of left main and RCA with stenosis in OM Redo redo CABG w SVG to OM, SVG to RCA8/10/00 Dr. Cyndia Bent   . Hyperlipidemia   . BPH (benign prostatic hypertrophy)   . Hypertensive heart disease     Change toprol to bisoprolol 10 mg daily on trial basis  05/21/2012  - notified by [facility] non adherent 07/17/2012    . LBBB (left bundle branch block)   . Second degree heart block 12/18/2012    s/p MDT Adapta L  pacemaker 12-20-2012 by Dr Caryl Comes  . Shingles years ago    mild  . Abrasion of left forearm dec 2015    small scraped area healing  . Chronic kidney disease stage III (GFR 30-59 ml/min)     saw dr Justin Mend oct 2015 and released by dr webb  . Congenital absence of kidney     "noted during AAA repair; never knew it before" (12/18/2012)  . Cancer 05/2014    bladder, s/p uro surg resection  . Myocardial infarction 1979    Past Surgical History  Procedure Laterality Date  . Cholecystectomy  1998  . Hemiarthroplasty shoulder fracture Right ~ 2008     x 2 - [facility]  . Carotid endarterectomy Bilateral 1990's  . Abdominal aortic aneurysm repair  1998  . Carotid-subclavian bypass graft Left 1990's  . Anal fissure repair      12/18/2012 "I don't remember this"  . Hemorroidectomy      12/18/2012 "I don't remember this"  . Carpal tunnel release Right 1980's    "Dr. Marrian Salvage" (12/18/2012)  . Cardiac catheterization      "2 or 3" (12/18/2012)  . Coronary angioplasty with stent placement      "I've got 1 or 2" (12/18/2012)  . Hip fracture surgery  1944    "fell out of a tree; had it operated  on 3 times" (12/18/2012)  . Refractive surgery Bilateral 1990's?  . Pacemaker insertion  12-20-2012    dual chamber Medtronic Adapta L pacemaker implanted by Dr Caryl Comes  . Cardioversion N/A 12/30/2013    Procedure: CARDIOVERSION;  Surgeon: Jacolyn Reedy, MD;  Location: [facility];  Service: Cardiovascular;  Laterality: N/A;  . Permanent pacemaker insertion N/A 12/20/2012    Procedure: PERMANENT PACEMAKER INSERTION;  Surgeon: Deboraha Sprang, MD;  Location: [facility] CATH LAB;  Service: Cardiovascular;  Laterality: N/A;  . Coronary artery bypass graft  1989, 2000     "CABG X ?3; CABG X 5 w//Bartle" (12/18/2012)  . Transurethral resection of bladder tumor N/A 06/16/2014    Procedure: TRANSURETHRAL RESECTION OF BLADDER TUMOR (TURBT) ;  Surgeon: Jorja Loa, MD;  Location: [facility];  Service: Urology;  Laterality:  N/A;  . Cardioversion N/A 09/26/2014    Procedure: CARDIOVERSION;  Surgeon: Jacolyn Reedy, MD;  Location[facility] ENDOSCOPY;  Service: Cardiovascular;  Laterality: N/A;    Current Outpatient Prescriptions  Medication Sig Dispense Refill  . DULERA 100-5 MCG/ACT AERO Inhale 2 puffs into the lungs 2 (two) times daily. 3 Inhaler 3  . furosemide (LASIX) 40 MG tablet Take 40 mg by mouth as directed.    Marland Kitchen HYDROcodone-acetaminophen (NORCO/VICODIN) 5-325 MG per tablet take 1/2 tablet by mouth every 6 hours if needed 20 tablet 0  . nitroGLYCERIN (NITROSTAT) 0.4 MG SL tablet Place 0.4 mg under the tongue every 5 (five) minutes as needed for chest pain.    . polyethylene glycol (MIRALAX / GLYCOLAX) packet Take 17 g by mouth daily.     . rosuvastatin (CRESTOR) 20 MG tablet Take 20 mg by mouth daily.      Marland Kitchen tiotropium (SPIRIVA) 18 MCG inhalation capsule Place 1 capsule (18 mcg total) into inhaler and inhale daily. 90 capsule 3  . warfarin (COUMADIN) 1 MG tablet Take 1 tablet (1 mg total) by mouth daily. Continue taking Coumadin and have PT/INR checked 06/30/2014 to have the dose adjusted appropriately 30 tablet 0   No current facility-administered medications for this visit.    Allergies  Allergen Reactions  . Ace Inhibitors Cough  . Clarithromycin Other (See Comments)    Strange thoughts and fell with biaxin  . Codeine Nausea And Vomiting  . Oxycodone-Acetaminophen Other (See Comments)    Felt closed in  . Viberzi [Eluxadoline] Other (See Comments)    Severe cramps and impaction    Review of Systems negative except from HPI and PMH  Physical Exam BP 104/64 mmHg  Pulse 74  Ht 5\' 11"  (1.803 m)  Wt 128 lb 9.6 oz (58.333 kg)  BMI 17.94 kg/m2 Well developed and well nourished in no acute distress HENT normal E scleral and icterus clear Neck Supple JVP 6-7 cm carotids brisk and full Clear to ausculation  *Regular rate and rhythm, no murmurs gallops or rub Soft with active bowel sounds No  clubbing cyanosis  Edema Alert and oriented, grossly normal motor and sensory function Skin Warm and Dry  ECG demonstrates ventricular pacing with underlying what appeared to be typical atrial flutter  Assessment and  Plan  Complete heart block   Pacemaker-Medtronic The patient's device was interrogated.  The information was reviewed. No changes were made in the programming.     Atrial flutter-permanent   The patient has permanent atrial flutter. His pacemaker was programmed DDD with mode switched to Mackey; we have reprogrammed to VVIR. Will follow up with Dr. Wynonia Lawman. We will  see him as needed

## 2015-01-07 ENCOUNTER — Encounter: Payer: Self-pay | Admitting: Internal Medicine

## 2015-01-19 ENCOUNTER — Encounter: Payer: Self-pay | Admitting: Cardiology

## 2015-01-19 NOTE — Progress Notes (Signed)
Patient ID: Edward [name], male   DOB: 09/30/1929, 81 y.o.   MRN: [id number]   Edward, [name]  Date of visit:  01/19/2015 DOB:  May 27, 1930    Age:  81 yrs. Medical record number:  [id number]     Account number:  [id number] Primary Care Provider: [facility] ____________________________ CURRENT DIAGNOSES  1. Persistent atrial fibrillation  2. Dyspnea, unspecified  3. Chronic diastolic heart failure  4. Peripheral vascular disease, unspecified  5. CAD Native without angina  6. Chronic kidney disease, stage 3 (moderate)  7. Essential (primary) hypertension  8. Nonrheumatic aortic (valve) stenosis  9. Chronic obstructive pulmonary disease, unspecified  10. Long term (current) use of anticoagulants  11. Left bundle-branch block  12. Hyperlipidemia  13. Malignant Neoplasm Of Bladder, Unspecified  14. Old myocardial infarction  15. Chronic atrial fibrillation  16. Presence of cardiac pacemaker  17. Presence of aortocoronary bypass graft  18. Idiopathic Gout, Unspecified Site  19. Occlusion and stenosis of bilateral carotid arteries ____________________________ ALLERGIES  Ace Inhibitors, Dry cough  Clarithromycin, Intolerance-unknown  Codeine, Intolerance-unknown ____________________________ MEDICATIONS  1. Spiriva with HandiHaler 18 mcg capsule, w/inhalation device, 1 p.o. daily  2. Colcrys 0.6 mg tablet, PRN  3. Dulera 200-5 mcg/actuation HFA aerosol inhaler, 2 puff bid  4. warfarin 1 mg tablet, 1 qd or as directed  5. nitroglycerin 0.4 mg sublingual tablet, PRN  6. Crestor 20 mg tablet, 1 p.o. daily  7. furosemide 40 mg tablet, 1 p.o. daily ____________________________ CHIEF COMPLAINTS  Followup of CAD Native without angina  Followup of Chronic atrial fibrillation ____________________________ HISTORY OF PRESENT ILLNESS Patient seen for cardiac followup. He is having less edema and is currently off of amiodarone. He continues to live with his sons and is less unsteady on  his feet and has not fallen. He doesn't have any angina and his edema has improved. His situation is poor. He has no bleeding complications from warfarin. He states that his treatment for his urologic symptoms has improved but he continues to have significant weight loss has not seen his primary doctor recently. ____________________________ PAST HISTORY  Past Medical Illnesses:  hypertension, hyperlipidemia, peripheral vascular disease, COPD, history of shingles, gout, GERD;  Cardiovascular Illnesses:  diastolic CHF, CAD, S/P MI-inferior, conduction disorder-LBBB, second degree heart block, atrial fibrillation;  Surgical Procedures:  redo CABG w SVG to OM, SVG to RCA8/10/00 Dr. Cyndia Bent, CABG w LIMA to LAD, SVG to dx, OM, RCA 1989 Dr. Arlyce Dice, carotid endarterectomy-bil, cholecystectomy, hemorrhoidectomy, L carotid subclavian bypass, repair of anal fissure, AAA repair, R Shoulder surgery x2;  NYHA Classification:  II;  Canadian Angina Classification:  Class 0: Asymptomatic;  Cardiology Procedures-Invasive:  cardiac cath (left) 2000, cardioversion August 2015, cardioversion April 2016;  Cardiology Procedures-Noninvasive:  adenosine cardiolte March 2008, treadmill cardiolite June 2011, echocardiogram June 2014, echocardiogram July 2015;  Cardiac Cath Results:  normal Left main, occluded LAD, occluded CFX, occluded RCA, occluded Diag 1 SVG, occluded RCA SVG, 99% stenosis CFX SVG, widely patent LAD LIMA graft;  LVEF of 45% documented via echocardiogram on 06/19/2014,   ____________________________ CARDIO-PULMONARY TEST DATES EKG Date:  12/08/2014;   Cardiac Cath Date:  05/10/2007;  CABG: 01/07/1999;  Nuclear Study Date:  11/26/2009;  Echocardiography Date: 06/19/2014;  Chest Xray Date: 11/18/2013;   ____________________________ FAMILY HISTORY Brother -- Brother dead, Diabetes mellitus Brother -- Brother alive and well Father -- Father dead, Leukemia Mother -- Mother dead, Coronary Artery Disease Sister --  Sister alive and well Sister -- Sister dead Sister --  Sister alive with problem, Cancer Sister -- Sister alive and well Sister -- Sister dead, Cancer Sister -- Coronary Artery Disease ____________________________ SOCIAL HISTORY Alcohol Use:  beer;  Smoking:  used to smoke but quit 1998, greater than 50 pack year history;  Diet:  regular diet without modifications;  Lifestyle:  widower;  Exercise:  exercise is limited due to physical disability;  Occupation:  retired Astronomer;  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 Weight:  126.00 lbs. Height:  70"BMI: 18  Constitutional:  pleasant white male in no acute distress, disheveled, cachectic Skin:  warm and dry to touch, no apparent skin lesions, or masses noted. Head:  normocephalic, normal hair pattern, no masses or tenderness Neck:  bilateral carotid endarterectomy scars, no JVD, no bruits, no masses, non-tender Chest:  healed median sternotomy scar, healed pacemaker incision in the left pectoral area, fine rales both bases Cardiac:  regular rhythm, normal S1 and S2, no S3 or S4, grade 1/6 systolic murmur at aortic area radiating to neck Peripheral Pulses:  bilateral femoral bruits present, femoral pulses 2+, dorsalis pedis pulses diminished, posterior tibial pulses diminished Extremities & Back:  well healed saphenous vein donor site RLE, well healed saphenous vein donor site LLE Neurological:  no gross motor or sensory  deficits noted, affect appropriate, oriented x3. ____________________________ MOST RECENT LIPID PANEL 11/18/13  CHOL TOTL 119 mg/dl, LDL 57 NM, HDL 47 mg/dl, TRIGLYCER 74 mg/dl and CHOL/HDL 2.5 (Calc) ____________________________ IMPRESSIONS/PLAN 1. Continued weight loss 2. Combined systolic and diastolic dysfunction but clinically compensated 3. Chronic atrial fibrillation now 4. Permanent pacemaker implantation 5. Coronary artery disease with prior bypass grafting  Recommendations:  Continue weight loss that is disturbing. His edema is better but I worry about his social situation at home. Suggested followup with his primary physician. Lab done as noted below. ____________________________ TODAYS ORDERS  1. Comprehensive Metabolic Panel: Today  2. Complete Blood Count: Today  3. Return Visit: 2 months                       ____________________________ Cardiology Physician:  Kerry Hough MD [facility]

## 2015-01-21 ENCOUNTER — Encounter: Payer: Self-pay | Admitting: Gastroenterology

## 2015-01-21 ENCOUNTER — Emergency Department (HOSPITAL_COMMUNITY)
Admission: EM | Admit: 2015-01-21 | Discharge: 2015-01-21 | Disposition: A | Discharge status: Home / Self Care | Payer: Medicare Other | Attending: Emergency Medicine | Admitting: Emergency Medicine

## 2015-01-21 ENCOUNTER — Encounter (HOSPITAL_COMMUNITY): Payer: Self-pay

## 2015-01-21 ENCOUNTER — Emergency Department (HOSPITAL_COMMUNITY): Payer: Medicare Other

## 2015-01-21 DIAGNOSIS — Z7901 Long term (current) use of anticoagulants: Secondary | ICD-10-CM | POA: Diagnosis not present

## 2015-01-21 DIAGNOSIS — Z79899 Other long term (current) drug therapy: Secondary | ICD-10-CM | POA: Diagnosis not present

## 2015-01-21 DIAGNOSIS — Z950 Presence of cardiac pacemaker: Secondary | ICD-10-CM | POA: Insufficient documentation

## 2015-01-21 DIAGNOSIS — I252 Old myocardial infarction: Secondary | ICD-10-CM | POA: Diagnosis not present

## 2015-01-21 DIAGNOSIS — I2510 Atherosclerotic heart disease of native coronary artery without angina pectoris: Secondary | ICD-10-CM | POA: Insufficient documentation

## 2015-01-21 DIAGNOSIS — Z9889 Other specified postprocedural states: Secondary | ICD-10-CM | POA: Insufficient documentation

## 2015-01-21 DIAGNOSIS — K5900 Constipation, unspecified: Secondary | ICD-10-CM

## 2015-01-21 DIAGNOSIS — N183 Chronic kidney disease, stage 3 (moderate): Secondary | ICD-10-CM | POA: Insufficient documentation

## 2015-01-21 DIAGNOSIS — Z9861 Coronary angioplasty status: Secondary | ICD-10-CM | POA: Insufficient documentation

## 2015-01-21 DIAGNOSIS — K6289 Other specified diseases of anus and rectum: Secondary | ICD-10-CM | POA: Diagnosis present

## 2015-01-21 DIAGNOSIS — Z87828 Personal history of other (healed) physical injury and trauma: Secondary | ICD-10-CM | POA: Insufficient documentation

## 2015-01-21 DIAGNOSIS — Z8739 Personal history of other diseases of the musculoskeletal system and connective tissue: Secondary | ICD-10-CM | POA: Diagnosis not present

## 2015-01-21 DIAGNOSIS — J449 Chronic obstructive pulmonary disease, unspecified: Secondary | ICD-10-CM | POA: Insufficient documentation

## 2015-01-21 DIAGNOSIS — E785 Hyperlipidemia, unspecified: Secondary | ICD-10-CM | POA: Insufficient documentation

## 2015-01-21 DIAGNOSIS — N390 Urinary tract infection, site not specified: Secondary | ICD-10-CM

## 2015-01-21 DIAGNOSIS — Z951 Presence of aortocoronary bypass graft: Secondary | ICD-10-CM | POA: Insufficient documentation

## 2015-01-21 DIAGNOSIS — F329 Major depressive disorder, single episode, unspecified: Secondary | ICD-10-CM | POA: Insufficient documentation

## 2015-01-21 DIAGNOSIS — Z87891 Personal history of nicotine dependence: Secondary | ICD-10-CM | POA: Diagnosis not present

## 2015-01-21 DIAGNOSIS — Z8619 Personal history of other infectious and parasitic diseases: Secondary | ICD-10-CM | POA: Diagnosis not present

## 2015-01-21 DIAGNOSIS — I1310 Hypertensive heart and chronic kidney disease without heart failure, with stage 1 through stage 4 chronic kidney disease, or unspecified chronic kidney disease: Secondary | ICD-10-CM | POA: Diagnosis not present

## 2015-01-21 DIAGNOSIS — Z8551 Personal history of malignant neoplasm of bladder: Secondary | ICD-10-CM | POA: Insufficient documentation

## 2015-01-21 DIAGNOSIS — Z9049 Acquired absence of other specified parts of digestive tract: Secondary | ICD-10-CM | POA: Insufficient documentation

## 2015-01-21 LAB — PROTIME-INR
INR: 2.08 — AB (ref 0.00–1.49)
PROTHROMBIN TIME: 23.2 s — AB (ref 11.6–15.2)

## 2015-01-21 LAB — CBC WITH DIFFERENTIAL/PLATELET
BASOS ABS: 0 10*3/uL (ref 0.0–0.1)
BASOS PCT: 1 % (ref 0–1)
Eosinophils Absolute: 0.1 10*3/uL (ref 0.0–0.7)
Eosinophils Relative: 1 % (ref 0–5)
HEMATOCRIT: 36.8 % — AB (ref 39.0–52.0)
HEMOGLOBIN: 12 g/dL — AB (ref 13.0–17.0)
Lymphocytes Relative: 9 % — ABNORMAL LOW (ref 12–46)
Lymphs Abs: 0.6 10*3/uL — ABNORMAL LOW (ref 0.7–4.0)
MCH: 28.6 pg (ref 26.0–34.0)
MCHC: 32.6 g/dL (ref 30.0–36.0)
MCV: 87.6 fL (ref 78.0–100.0)
Monocytes Absolute: 0.7 10*3/uL (ref 0.1–1.0)
Monocytes Relative: 10 % (ref 3–12)
NEUTROS ABS: 5.3 10*3/uL (ref 1.7–7.7)
NEUTROS PCT: 79 % — AB (ref 43–77)
Platelets: 225 10*3/uL (ref 150–400)
RBC: 4.2 MIL/uL — ABNORMAL LOW (ref 4.22–5.81)
RDW: 19.8 % — ABNORMAL HIGH (ref 11.5–15.5)
WBC: 6.7 10*3/uL (ref 4.0–10.5)

## 2015-01-21 LAB — COMPREHENSIVE METABOLIC PANEL
ALBUMIN: 3.5 g/dL (ref 3.5–5.0)
ALK PHOS: 160 U/L — AB (ref 38–126)
ALT: 28 U/L (ref 17–63)
ANION GAP: 12 (ref 5–15)
AST: 40 U/L (ref 15–41)
BUN: 26 mg/dL — ABNORMAL HIGH (ref 6–20)
CO2: 25 mmol/L (ref 22–32)
Calcium: 9.1 mg/dL (ref 8.9–10.3)
Chloride: 97 mmol/L — ABNORMAL LOW (ref 101–111)
Creatinine, Ser: 1.58 mg/dL — ABNORMAL HIGH (ref 0.61–1.24)
GFR, EST AFRICAN AMERICAN: 45 mL/min — AB (ref >60)
GFR, EST NON AFRICAN AMERICAN: 39 mL/min — AB (ref >60)
Glucose, Bld: 103 mg/dL — ABNORMAL HIGH (ref 65–99)
POTASSIUM: 3.5 mmol/L (ref 3.5–5.1)
Sodium: 134 mmol/L — ABNORMAL LOW (ref 135–145)
Total Bilirubin: 1.7 mg/dL — ABNORMAL HIGH (ref 0.3–1.2)
Total Protein: 8 g/dL (ref 6.5–8.1)

## 2015-01-21 LAB — URINALYSIS, ROUTINE W REFLEX MICROSCOPIC
Bilirubin Urine: NEGATIVE
GLUCOSE, UA: NEGATIVE mg/dL
KETONES UR: NEGATIVE mg/dL
NITRITE: NEGATIVE
PROTEIN: NEGATIVE mg/dL
Specific Gravity, Urine: 1.009 (ref 1.005–1.030)
Urobilinogen, UA: 1 mg/dL (ref 0.0–1.0)
pH: 7 (ref 5.0–8.0)

## 2015-01-21 LAB — URINE MICROSCOPIC-ADD ON

## 2015-01-21 LAB — LIPASE, BLOOD: LIPASE: 22 U/L (ref 22–51)

## 2015-01-21 MED ORDER — POLYETHYLENE GLYCOL 3350 17 G PO PACK: 17.0000 g | PACK | Freq: Every day | ORAL | Status: AC

## 2015-01-21 MED ORDER — IOHEXOL 300 MG/ML  SOLN
100.0000 mL | Freq: Once | INTRAMUSCULAR | Status: AC | PRN
  Administered 2015-01-21: 80 mL via INTRAVENOUS

## 2015-01-21 MED ORDER — GLYCERIN (LAXATIVE) 2 G RE SUPP: 1.0000 | Freq: Once | RECTAL | Status: DC

## 2015-01-21 MED ORDER — AMOXICILLIN-POT CLAVULANATE 875-125 MG PO TABS: 1.0000 | ORAL_TABLET | Freq: Two times a day (BID) | ORAL | Status: DC

## 2015-01-21 MED ORDER — MORPHINE SULFATE (PF) 4 MG/ML IV SOLN
4.0000 mg | Freq: Once | INTRAVENOUS | Status: AC
  Administered 2015-01-21: 4 mg via INTRAVENOUS
  Filled 2015-01-21: qty 1

## 2015-01-21 MED ORDER — LEVOFLOXACIN 750 MG PO TABS: 750.0000 mg | ORAL_TABLET | Freq: Every day | ORAL | Status: DC

## 2015-01-21 MED ORDER — IOHEXOL 300 MG/ML  SOLN
50.0000 mL | Freq: Once | INTRAMUSCULAR | Status: AC | PRN
  Administered 2015-01-21: 50 mL via ORAL

## 2015-01-21 NOTE — ED Provider Notes (Signed)
CSN: [id number]     Arrival date & time 01/21/15  1050 History   First MD Initiated Contact with Patient 01/21/15 1111     Chief Complaint  Patient presents with  . Rectal Pain     (Consider location/radiation/quality/duration/timing/severity/associated sxs/prior Treatment) HPI patient reports he thought he was constipated. He had not had a bowel movement for about 1-2 days. He had a pressure sensation in his lower abdomen. He tried an enema and reports he got some bowel movement out. He reports afterward however he felt that he had some type of a "blister" in his rectal area and it is been too painful for him to be able to sit down. He reports intense pain in the rectal region, he has not noted any bleeding. Patient does have a history of bladder cancer. He reports he does have problems with urinary frequency and urgency. He denies recent fever. He has not had associated vomiting. Patient reports that he is supposed to take daily MiraLAX but he has not been doing that recently. He got behind on it. Past Medical History  Diagnosis Date  . Gout     "only once in my lifetime" (12/18/2012)  . Depression   . PVD (peripheral vascular disease)     s/p B CEA  . COPD (chronic obstructive pulmonary disease)      PFT 6.19.07: FEV1 465  ratio 60 with 25% response to B2.  > PFTs 8.29.07: FEV1 61%  ratio 46% no better after B2.  > add on advair 12.20.11-improved 1.31.12  . AAA (abdominal aortic aneurysm)   . Melanosis coli   . Internal hemorrhoids   . Carotid artery occlusion   . Aortic valve disorder   . Chronic diastolic heart failure 94/11/960  . CAD (coronary artery disease), native coronary artery     CABG w LIMA to LAD, SVG to dx, OM, RCA 1989 Dr. Arlyce Dice for 3VD PTCA of OM, 1999 and 2000 Cath showed occlusion of left main and RCA with stenosis in OM Redo redo CABG w SVG to OM, SVG to RCA8/10/00 Dr. Cyndia Bent   . Hyperlipidemia   . BPH (benign prostatic hypertrophy)   . Hypertensive heart disease      Change toprol to bisoprolol 10 mg daily on trial basis  05/21/2012  - notified by [facility] non adherent 07/17/2012    . LBBB (left bundle branch block)   . Second degree heart block 12/18/2012    s/p MDT Adapta L pacemaker 12-20-2012 by Dr Caryl Comes  . Shingles years ago    mild  . Abrasion of left forearm dec 2015    small scraped area healing  . Chronic kidney disease stage III (GFR 30-59 ml/min)     saw dr Justin Mend oct 2015 and released by dr webb  . Congenital absence of kidney     "noted during AAA repair; never knew it before" (12/18/2012)  . Myocardial infarction 1979  . Cancer 05/2014    bladder, s/p uro surg resection   Past Surgical History  Procedure Laterality Date  . Cholecystectomy  1998  . Hemiarthroplasty shoulder fracture Right ~ 2008     x 2 - [facility]  . Carotid endarterectomy Bilateral 1990's  . Abdominal aortic aneurysm repair  1998  . Carotid-subclavian bypass graft Left 1990's  . Anal fissure repair      12/18/2012 "I don't remember this"  . Hemorroidectomy      12/18/2012 "I don't remember this"  . Carpal tunnel release Right 1980's    "  Dr. Marrian Salvage" (12/18/2012)  . Cardiac catheterization      "2 or 3" (12/18/2012)  . Coronary angioplasty with stent placement      "I've got 1 or 2" (12/18/2012)  . Hip fracture surgery  1944    "fell out of a tree; had it operated on 3 times" (12/18/2012)  . Refractive surgery Bilateral 1990's?  . Pacemaker insertion  12-20-2012    dual chamber Medtronic Adapta L pacemaker implanted by Dr Caryl Comes  . Cardioversion N/A 12/30/2013    Procedure: CARDIOVERSION;  Surgeon: Jacolyn Reedy, MD;  Location: [place];  Service: Cardiovascular;  Laterality: N/A;  . Permanent pacemaker insertion N/A 12/20/2012    Procedure: PERMANENT PACEMAKER INSERTION;  Surgeon: Deboraha Sprang, MD;  Location: [facility] CATH LAB;  Service: Cardiovascular;  Laterality: N/A;  . Coronary artery bypass graft  1989, 2000     "CABG X ?3; CABG X 5 w//Bartle" (12/18/2012)  .  Transurethral resection of bladder tumor N/A 06/16/2014    Procedure: TRANSURETHRAL RESECTION OF BLADDER TUMOR (TURBT) ;  Surgeon: Jorja Loa, MD;  Location: [facility] ORS;  Service: Urology;  Laterality: N/A;  . Cardioversion N/A 09/26/2014    Procedure: CARDIOVERSION;  Surgeon: Jacolyn Reedy, MD;  Location: [facility] ENDOSCOPY;  Service: Cardiovascular;  Laterality: N/A;   Family History  Problem Relation Age of Onset  . Heart disease Mother   . Colon cancer Neg Hx    Social History  Substance Use Topics  . Smoking status: Former Smoker -- 1.50 packs/day for 50 years    Types: Cigarettes    Quit date: 05/30/1996  . Smokeless tobacco: Never Used  . Alcohol Use: 1.8 oz/week    3 Cans of beer per week     Comment: beer few times per week    Review of Systems 10 Systems reviewed and are negative for acute change except as noted in the HPI.    Allergies  Ace inhibitors; Clarithromycin; Codeine; Oxycodone-acetaminophen; and Viberzi  Home Medications   Prior to Admission medications   Medication Sig Start Date End Date Taking? Authorizing Provider  DULERA 100-5 MCG/ACT AERO Inhale 2 puffs into the lungs 2 (two) times daily. 11/26/14  Yes Tanda Rockers, MD  furosemide (LASIX) 40 MG tablet Take 40 mg by mouth as directed.   Yes Historical Provider, MD  nitroGLYCERIN (NITROSTAT) 0.4 MG SL tablet Place 0.4 mg under the tongue every 5 (five) minutes as needed for chest pain.   Yes Historical Provider, MD  polyethylene glycol (MIRALAX / GLYCOLAX) packet Take 17 g by mouth daily.    Yes Historical Provider, MD  rosuvastatin (CRESTOR) 20 MG tablet Take 20 mg by mouth daily.     Yes Historical Provider, MD  tiotropium (SPIRIVA) 18 MCG inhalation capsule Place 1 capsule (18 mcg total) into inhaler and inhale daily. 11/26/14 11/26/15 Yes Tanda Rockers, MD  warfarin (COUMADIN) 1 MG tablet Take 1 tablet (1 mg total) by mouth daily. Continue taking Coumadin and have PT/INR checked 06/30/2014 to have the  dose adjusted appropriately 06/28/14  Yes Theodis Blaze, MD  amoxicillin-clavulanate (AUGMENTIN) 875-125 MG per tablet Take 1 tablet by mouth 2 (two) times daily. One po bid x 7 days 01/21/15   Charlesetta Shanks, MD  glycerin adult (GLYCERIN ADULT) 2 G SUPP Place 1 suppository rectally once. 01/21/15   Charlesetta Shanks, MD  HYDROcodone-acetaminophen (NORCO/VICODIN) 5-325 MG per tablet take 1/2 tablet by mouth every 6 hours if needed Patient not taking: Reported  on 01/21/2015 11/20/14   Olga Millers, MD  polyethylene glycol [facility] / Floria Raveling) packet Take 17 g by mouth daily. 01/21/15   Charlesetta Shanks, MD   BP 126/73 mmHg  Pulse 74  Temp(Src) 98 F (36.7 C) (Oral)  Resp 16  SpO2 94% Physical Exam  Constitutional: He is oriented to person, place, and time. He appears well-developed and well-nourished.  Patient is a thin gentleman he is standing up in the room. He expresses significant rectal pain.  HENT:  Head: Normocephalic and atraumatic.  Eyes: EOM are normal. Pupils are equal, round, and reactive to light.  Neck: Neck supple.  Cardiovascular: Normal rate, regular rhythm, normal heart sounds and intact distal pulses.   Pulmonary/Chest: Effort normal and breath sounds normal.  Abdominal: Soft. Bowel sounds are normal. He exhibits mass. He exhibits no distension. There is tenderness.  Suprapubic tenderness and fullness. No guarding or rebound.  Genitourinary:  Rectal examination does not show any external hemorrhoid or mass. Digital examination of the rectum is very tender without localizing mass but entire bogginess throughout. There is no retained stool or impaction.  Musculoskeletal: Normal range of motion. He exhibits no edema.  Trace edema bilateral lower extremities.   Neurological: He is alert and oriented to person, place, and time. He has normal strength. Coordination normal. GCS eye subscore is 4. GCS verbal subscore is 5. GCS motor subscore is 6.  Skin: Skin is warm, dry and  intact.  Psychiatric: He has a normal mood and affect.    ED Course  Procedures (including critical care time) Labs Review Labs Reviewed  COMPREHENSIVE METABOLIC PANEL - Abnormal; Notable for the following:    Sodium 134 (*)    Chloride 97 (*)    Glucose, Bld 103 (*)    BUN 26 (*)    Creatinine, Ser 1.58 (*)    Alkaline Phosphatase 160 (*)    Total Bilirubin 1.7 (*)    GFR calc non Af Amer 39 (*)    GFR calc Af Amer 45 (*)    All other components within normal limits  CBC WITH DIFFERENTIAL/PLATELET - Abnormal; Notable for the following:    RBC 4.20 (*)    Hemoglobin 12.0 (*)    HCT 36.8 (*)    RDW 19.8 (*)    Neutrophils Relative % 79 (*)    Lymphocytes Relative 9 (*)    Lymphs Abs 0.6 (*)    All other components within normal limits  PROTIME-INR - Abnormal; Notable for the following:    Prothrombin Time 23.2 (*)    INR 2.08 (*)    All other components within normal limits  URINALYSIS, ROUTINE W REFLEX MICROSCOPIC (NOT AT [facility]) - Abnormal; Notable for the following:    APPearance CLOUDY (*)    Hgb urine dipstick SMALL (*)    Leukocytes, UA LARGE (*)    All other components within normal limits  LIPASE, BLOOD  URINE MICROSCOPIC-ADD ON    Imaging Review Ct Abdomen Pelvis W Contrast  01/21/2015   CLINICAL DATA:  Rectal pain since this morning. History of bladder cancer.  EXAM: CT ABDOMEN AND PELVIS WITH CONTRAST  TECHNIQUE: Multidetector CT imaging of the abdomen and pelvis was performed using the standard protocol following bolus administration of intravenous contrast.  CONTRAST:  36mL OMNIPAQUE IOHEXOL 300 MG/ML  SOLN  COMPARISON:  06/11/2014  FINDINGS: Small right pleural effusion has developed. Interlobular septal thickening towards the lung bases with a peripheral distribution has developed. It is not nodular.  Postcholecystectomy  Small calcification along the posterior liver capsule inferiorly. No liver lesion.  Spleen is unremarkable  Pancreas is atrophic.  Adrenal  glands are unremarkable.  Stable severe atrophy of the left kidney with multiple benign appearing cysts. Small hypodense lesions in the right kidney are not significantly changed. There is heterogeneous enhancement in the medial upper pole of the right kidney of unknown significance. See images 3 through 10 of series 3, on the delayed sequence.  Moderate stool burden throughout the colon. Moderate stool burden in the rectum.  Bladder is mildly distended. The areas of bladder wall irregularity towards the base of the bladder and posterior bladder are no longer visualized. There is however a 9 mm soft tissue nodule along the anterior lower bladder. That is new Prostate is within normal limits.  There is soft tissue density and in the distal right inguinal ring that is also a new finding and of unknown significance. The soft tissue area measures 3.2 by 1.9 cm. On prior imaging, this was not present, therefore it is not an undescended testicle.  Atherosclerotic calcifications throughout the abdominal aorta and visceral vasculature are noted. Maximal transverse diameter of the juxtarenal aorta is 3.5 cm.  There has been further loss of height at the L1 and L3 compression fractures. There is increasing fragmentation involving the superior endplate of L1. Bilateral L5 pars defects with grade 1 L5-S1 spondylolisthesis.  IMPRESSION: Small right pleural effusion. Basilar interlobular septal thickening. Findings are most consistent with volume overload and pulmonary edema. An inflammatory process is not excluded.  New soft tissue area measuring greater than 3 cm in the distal right inguinal laying. Based on prior imaging, this is not an undescended testicle. Metastatic disease is not excluded. Ultrasound may be helpful  New 9 mm nodule along the anterior wall of the bladder. New transitional cell carcinoma is not excluded. Cystoscopy may be helpful.  Moderate stool burden.  Worsening of the L1 and L3 compression fractures as  described. There is further fragmentation of the superior endplate at L1.   Electronically Signed   By: Marybelle Killings M.D.   On: 01/21/2015 15:07   I have personally reviewed and evaluated these images and lab results as part of my medical decision-making.   EKG Interpretation None      MDM   Final diagnoses:  Constipation, unspecified constipation type  UTI (lower urinary tract infection)   Patient is a frank abdominal pain. He is having rectal pain. CT does show large stool. Digital examination however does not show stool to be within the vault impacted. There is also no blood in the rectal vault. Urine is positive for UTI. Patient will be started on Augmentin for UTI. He is also instructed to resume daily MiraLAX and he is given glycerin suppositories for a rectal stimulant. He is otherwise alert and appropriate with stable vital signs. He is nontoxic in appearance. Patient instructed on signs and symptoms to return. He is also instructed to follow up with Dr.Dahlstead regarding monitoring of CT findings suggestive of a bladder cancer with his known history of cancer.    Charlesetta Shanks, MD 01/21/15 [phone]

## 2015-01-21 NOTE — Telephone Encounter (Signed)
A user error has taken place.

## 2015-01-21 NOTE — ED Notes (Signed)
Pt reports "it feels like there is a blister on my backside."  Pain score 3/10.  Pt reports pain increases w/ movement.  Pt reports noticing "blister" after a BM.

## 2015-01-21 NOTE — Discharge Instructions (Signed)
Urinary Tract Infection Urinary tract infections (UTIs) can develop anywhere along your urinary tract. Your urinary tract is your body's drainage system for removing wastes and extra water. Your urinary tract includes two kidneys, two ureters, a bladder, and a urethra. Your kidneys are a pair of bean-shaped organs. Each kidney is about the size of your fist. They are located below your ribs, one on each side of your spine. CAUSES Infections are caused by microbes, which are microscopic organisms, including fungi, viruses, and bacteria. These organisms are so small that they can only be seen through a microscope. Bacteria are the microbes that most commonly cause UTIs. SYMPTOMS  Symptoms of UTIs may vary by age and gender of the patient and by the location of the infection. Symptoms in young women typically include a frequent and intense urge to urinate and a painful, burning feeling in the bladder or urethra during urination. Older women and men are more likely to be tired, shaky, and weak and have muscle aches and abdominal pain. A fever may mean the infection is in your kidneys. Other symptoms of a kidney infection include pain in your back or sides below the ribs, nausea, and vomiting. DIAGNOSIS To diagnose a UTI, your caregiver will ask you about your symptoms. Your caregiver also will ask to provide a urine sample. The urine sample will be tested for bacteria and white blood cells. White blood cells are made by your body to help fight infection. TREATMENT  Typically, UTIs can be treated with medication. Because most UTIs are caused by a bacterial infection, they usually can be treated with the use of antibiotics. The choice of antibiotic and length of treatment depend on your symptoms and the type of bacteria causing your infection. HOME CARE INSTRUCTIONS  If you were prescribed antibiotics, take them exactly as your caregiver instructs you. Finish the medication even if you feel better after you  have only taken some of the medication.  Drink enough water and fluids to keep your urine clear or pale yellow.  Avoid caffeine, tea, and carbonated beverages. They tend to irritate your bladder.  Empty your bladder often. Avoid holding urine for long periods of time.  Empty your bladder before and after sexual intercourse.  After a bowel movement, women should cleanse from front to back. Use each tissue only once. SEEK MEDICAL CARE IF:   You have back pain.  You develop a fever.  Your symptoms do not begin to resolve within 3 days. SEEK IMMEDIATE MEDICAL CARE IF:   You have severe back pain or lower abdominal pain.  You develop chills.  You have nausea or vomiting.  You have continued burning or discomfort with urination. MAKE SURE YOU:   Understand these instructions.  Will watch your condition.  Will get help right away if you are not doing well or get worse. Document Released: 02/23/2005 Document Revised: 11/15/2011 Document Reviewed: 06/24/2011 [facility] Patient Information 2015 [place], [place]. This information is not intended to replace advice given to you by your health care provider. Make sure you discuss any questions you have with your health care provider. Constipation Constipation is when a person has fewer than three bowel movements a week, has difficulty having a bowel movement, or has stools that are dry, hard, or larger than normal. As people grow older, constipation is more common. If you try to fix constipation with medicines that make you have a bowel movement (laxatives), the problem may get worse. Long-term laxative use may cause the muscles  of the colon to become weak. A low-fiber diet, not taking in enough fluids, and taking certain medicines may make constipation worse.  CAUSES   Certain medicines, such as antidepressants, pain medicine, iron supplements, antacids, and water pills.   Certain diseases, such as diabetes, irritable bowel syndrome (IBS),  thyroid disease, or depression.   Not drinking enough water.   Not eating enough fiber-rich foods.   Stress or travel.   Lack of physical activity or exercise.   Ignoring the urge to have a bowel movement.   Using laxatives too much.  SIGNS AND SYMPTOMS   Having fewer than three bowel movements a week.   Straining to have a bowel movement.   Having stools that are hard, dry, or larger than normal.   Feeling full or bloated.   Pain in the lower abdomen.   Not feeling relief after having a bowel movement.  DIAGNOSIS  Your health care provider will take a medical history and perform a physical exam. Further testing may be done for severe constipation. Some tests may include:  A barium enema X-ray to examine your rectum, colon, and, sometimes, your small intestine.   A sigmoidoscopy to examine your lower colon.   A colonoscopy to examine your entire colon. TREATMENT  Treatment will depend on the severity of your constipation and what is causing it. Some dietary treatments include drinking more fluids and eating more fiber-rich foods. Lifestyle treatments may include regular exercise. If these diet and lifestyle recommendations do not help, your health care provider may recommend taking over-the-counter laxative medicines to help you have bowel movements. Prescription medicines may be prescribed if over-the-counter medicines do not work.  HOME CARE INSTRUCTIONS   Eat foods that have a lot of fiber, such as fruits, vegetables, whole grains, and beans.  Limit foods high in fat and processed sugars, such as french fries, hamburgers, cookies, candies, and soda.   A fiber supplement may be added to your diet if you cannot get enough fiber from foods.   Drink enough fluids to keep your urine clear or pale yellow.   Exercise regularly or as directed by your health care provider.   Go to the restroom when you have the urge to go. Do not hold it.   Only take  over-the-counter or prescription medicines as directed by your health care provider. Do not take other medicines for constipation without talking to your health care provider first.  [place] IF:   You have bright red blood in your stool.   Your constipation lasts for more than 4 days or gets worse.   You have abdominal or rectal pain.   You have thin, pencil-like stools.   You have unexplained weight loss. MAKE SURE YOU:   Understand these instructions.  Will watch your condition.  Will get help right away if you are not doing well or get worse. Document Released: 02/12/2004 Document Revised: 05/21/2013 Document Reviewed: 02/25/2013 [facility] Patient Information 2015 [place], [place]. This information is not intended to replace advice given to you by your health care provider. Make sure you discuss any questions you have with your health care provider.

## 2015-01-21 NOTE — ED Notes (Signed)
Pt getting dressed.

## 2015-01-21 NOTE — ED Notes (Signed)
Patient transported to CT 

## 2015-01-21 NOTE — ED Notes (Signed)
Made hourly rounding but system was down. Paper chart

## 2015-03-04 ENCOUNTER — Telehealth: Payer: Self-pay | Admitting: Internal Medicine

## 2015-03-04 MED ORDER — TIOTROPIUM BROMIDE MONOHYDRATE 18 MCG IN CAPS: 18.0000 ug | ORAL_CAPSULE | Freq: Every day | RESPIRATORY_TRACT | Status: AC

## 2015-03-04 NOTE — Telephone Encounter (Signed)
Last seen on 09/11/2014 by MW  Pt was in waiting in lobby. I brought pt back to one of MW's rooms and he explained he was needing a 90 days supply of spiriva 12mcg sent to [facility] and was asking if we had any spiriva samples. He explained that he had called in on 11/26/14 for a refill on dulera and spiriva but never received spiriva. I asked him to call the office next time he does not receive med again. I informed him that we did not have any samples at this time and that I would send a 90 day supply to pharmacy. He voiced understanding and had no further questions. Nothing further needed.

## 2015-03-11 ENCOUNTER — Ambulatory Visit: Payer: Medicare Other | Admitting: Internal Medicine

## 2015-03-16 ENCOUNTER — Telehealth: Payer: Self-pay | Admitting: Internal Medicine

## 2015-03-16 DIAGNOSIS — J312 Chronic pharyngitis: Secondary | ICD-10-CM

## 2015-03-16 NOTE — Telephone Encounter (Signed)
Pt request referral for ENT for to sore throat over a week. Please advise.

## 2015-03-17 ENCOUNTER — Other Ambulatory Visit: Payer: Self-pay | Admitting: Urology

## 2015-03-23 ENCOUNTER — Encounter: Payer: Self-pay | Admitting: Cardiology

## 2015-03-23 NOTE — Progress Notes (Signed)
Patient ID: Edward [name], male   DOB: 08/23/1929, 81 y.o.   MRN: [id number]   Edward [name]  Date of visit:  03/23/2015 DOB:  26-Jan-1930    Age:  81 yrs. Medical record number:  [id number]     Account number:  [id number] Primary Care Provider: [facility] ____________________________ CURRENT DIAGNOSES  1. Dyspnea, unspecified  2. Encounter for preprocedural cardiovascular examination  3. Chronic kidney disease, stage 3 (moderate)  4. CAD Native without angina  5. Chronic atrial fibrillation  6. Chronic combined systolic (congestive) and diastolic (congestive) heart failure  7. Essential (primary) hypertension  8. Nonrheumatic aortic (valve) stenosis  9. Long term (current) use of anticoagulants  10. Left bundle-branch block  11. Hyperlipidemia  12. Malignant Neoplasm Of Bladder, Unspecified  13. Old myocardial infarction  14. Occlusion and stenosis of bilateral carotid arteries  15. Peripheral vascular disease, unspecified  16. Chronic obstructive pulmonary disease, unspecified  17. Idiopathic Gout, Unspecified Site  18. Presence of cardiac pacemaker  19. Presence of aortocoronary bypass graft ____________________________ ALLERGIES  Ace Inhibitors, Dry cough  Clarithromycin, Intolerance-unknown  Codeine, Intolerance-unknown ____________________________ MEDICATIONS  1. Spiriva with HandiHaler 18 mcg capsule, w/inhalation device, 1 p.o. daily  2. Colcrys 0.6 mg tablet, PRN  3. Dulera 200-5 mcg/actuation HFA aerosol inhaler, 2 puff bid  4. warfarin 1 mg tablet, 1 qd or as directed  5. nitroglycerin 0.4 mg sublingual tablet, PRN  6. Crestor 20 mg tablet, 1 p.o. daily  7. furosemide 40 mg tablet, 1 p.o. daily  8. Rapaflo 8 mg capsule, 1 p.o. daily  9. Miralax 17 gram/dose oral powder, qd ____________________________ CHIEF COMPLAINTS  Surgical clearance ____________________________ HISTORY OF PRESENT ILLNESS Patient seen for preop cardiac evaluation. He has gained some  weight since he was here. He is due to have urologic surgery and needs clearance. He is not currently having angina and states that his appetite is better but he still remains quite cachectic. He complains of significant dyspnea and has significant edema in both of his lower legs and has trouble getting his shoes on now. He denies PND orthopnea or claudication. His social situation is somewhat difficult at home now and he is quite weak. Previous echocardiogram showed an ejection fraction of around 45%. ____________________________ PAST HISTORY  Past Medical Illnesses:  hypertension, hyperlipidemia, peripheral vascular disease, COPD, history of shingles, gout, GERD;  Cardiovascular Illnesses:  diastolic CHF, CAD, S/P MI-inferior, conduction disorder-LBBB, second degree heart block, atrial fibrillation;  Surgical Procedures:  redo CABG w SVG to OM, SVG to RCA8/10/00 Dr. Cyndia Bent, CABG w LIMA to LAD, SVG to dx, OM, RCA 1989 Dr. Arlyce Dice, carotid endarterectomy-bil, cholecystectomy, hemorrhoidectomy, L carotid subclavian bypass, repair of anal fissure, AAA repair, R Shoulder surgery x2;  NYHA Classification:  II;  Canadian Angina Classification:  Class 0: Asymptomatic;  Cardiology Procedures-Invasive:  cardiac cath (left) 2000, cardioversion August 2015, cardioversion April 2016;  Cardiology Procedures-Noninvasive:  adenosine cardiolte March 2008, treadmill cardiolite June 2011, echocardiogram June 2014, echocardiogram July 2015;  Cardiac Cath Results:  normal Left main, occluded LAD, occluded CFX, occluded RCA, occluded Diag 1 SVG, occluded RCA SVG, 99% stenosis CFX SVG, widely patent LAD LIMA graft;  LVEF of 45% documented via echocardiogram on 06/19/2014,   ____________________________ CARDIO-PULMONARY TEST DATES EKG Date:  03/23/2015;   Cardiac Cath Date:  05/10/2007;  CABG: 01/07/1999;  Nuclear Study Date:  11/26/2009;  Echocardiography Date: 06/19/2014;  Chest Xray Date: 11/18/2013;    ____________________________ SOCIAL HISTORY Alcohol Use:  beer;  Smoking:  used to smoke but quit 1998, greater than 50 pack year history;  Diet:  regular diet without modifications;  Lifestyle:  widower;  Exercise:  exercise is limited due to physical disability;  Occupation:  retired Astronomer;  Residence:  lives with son;   ____________________________ REVIEW OF SYSTEMS General:  malaise and fatigue, weight gain of approximately 10 lbs Eyes: wears eye glasses/contact lenses, cataracts Ears, Nose, Throat, Mouth:  sore throat, hoarseness Respiratory: dyspnea with exertion Cardiovascular:  please review HPI Abdominal: denies dyspepsia, GI bleeding, constipation, or diarrheaNeurological:  unsteady gait Psychiatric:  insomnia  ____________________________ PHYSICAL EXAMINATION VITAL SIGNS  Blood Pressure:  88/64 Sitting, Left arm, regular cuff  , 72/60 Standing, Left arm and regular cuff   Pulse:  80/min. Weight:  135.00 lbs. Height:  70"BMI: 19  Constitutional:  pleasant white male in no acute distress, cachectic Skin:  warm and dry to touch, no apparent skin lesions, or masses noted. Head:  normocephalic, normal hair pattern, no masses or tenderness Neck:  bilateral carotid endarterectomy scars, no JVD, no bruits, no masses, non-tender Chest:  healed median sternotomy scar, healed pacemaker incision in the left pectoral area, fine rales both bases Cardiac:  regular rhythm, normal S1 and S2, no S3 or S4, grade 1/6 systolic murmur at aortic area radiating to neck Peripheral Pulses:  bilateral femoral bruits present, femoral pulses 2+, dorsalis pedis pulses diminished, posterior tibial pulses diminished Extremities & Back:  well healed saphenous vein donor site RLE, well healed saphenous vein donor site LLE, 2+ edema Neurological:  no gross motor or sensory deficits noted, affect appropriate, oriented x3. ____________________________ MOST RECENT LIPID PANEL 11/18/13  CHOL TOTL 119 mg/dl, LDL  57 NM, HDL 47 mg/dl, TRIGLYCER 74 mg/dl and CHOL/HDL 2.5 (Calc) ____________________________ IMPRESSIONS/PLAN  1. From a cardiovascular viewpoint his risk of surgery is increased due to his 81,  frailty, and mildly reduced systolic function. He has a number of other comorbidities. He appears to be volume overloaded and I recommended that he weighed daily and that he increase his furosemide to 40 mg twice daily. 2. Chronic atrial flutter and fibrillation with inability to convert to sinus rhythm previously 3. COPD 4. Stage III chronic kidney disease 5. Aortic stenosis mild 6. Coronary artery disease with previous bypass grafting an old infarction 7. Chronic systolic CHF  with some volume overload  Recommendations:  Recommended increasing the furosemide to 40 mg twice daily. Need to be sure that his volume status is optimized prior to surgery. In addition he may hold his warfarin for at least 4 days prior to surgery to be sure this is optimized at the time of surgery. He does not need to be bridged. I would like to see him in the hospital when he has his surgery.   ____________________________ TODAYS ORDERS  1. 12 Lead EKG: Today  2. Return visit in 2 months                       ____________________________ Cardiology Physician:  Kerry Hough MD [facility]

## 2015-03-26 ENCOUNTER — Encounter: Payer: Self-pay | Admitting: *Deleted

## 2015-03-26 NOTE — Patient Instructions (Addendum)
Edward [name]  03/26/2015   Your procedure is scheduled on: Thursday 04/02/2015  Report to [facility]  Entrance take [facility]  elevators to 3rd floor to  [name] at  1045 AM.  Call this number if you have problems the morning of surgery [phone]   Remember: ONLY 1 PERSON MAY GO WITH YOU TO SHORT STAY TO GET  READY MORNING OF [name].   Do not eat food or drink liquids :After Midnight.    Take these medicines the morning of surgery with A SIP OF WATER: Dulera Inhaler, Spiriva inhaler                               You may not have any metal on your body including hair pins and              piercings  Do not wear jewelry, make-up, lotions, powders or perfumes, deodorant             Do not wear nail polish.  Do not shave  48 hours prior to surgery.              Men may shave face and neck.   Do not bring valuables to the hospital. [name].  Contacts, dentures or bridgework may not be worn into surgery.  Leave suitcase in the car. After surgery it may be brought to your room.    Special Instructions: practice deep breathing and leg exercises              Please read over the following fact sheets you were given: _____________________________________________________________________             [facility] - Preparing for Surgery Before surgery, you can play an important role.  Because skin is not sterile, your skin needs to be as free of germs as possible.  You can reduce the number of germs on your skin by washing with CHG (chlorahexidine gluconate) soap before surgery.  CHG is an antiseptic cleaner which kills germs and bonds with the skin to continue killing germs even after washing. Please DO NOT use if you have an allergy to CHG or antibacterial soaps.  If your skin becomes reddened/irritated stop using the CHG and inform your nurse when you arrive at Short Stay. Do not shave  (including legs and underarms) for at least 48 hours prior to the first CHG shower.  You may shave your face/neck. Please follow these instructions carefully:  1.  Shower with CHG Soap the night before surgery and the  morning of Surgery.  2.  If you choose to wash your hair, wash your hair first as usual with your  normal  shampoo.  3.  After you shampoo, rinse your hair and body thoroughly to remove the  shampoo.                           4.  Use CHG as you would any other liquid soap.  You can apply chg directly  to the skin and wash                       Gently with a  scrungie or clean washcloth.  5.  Apply the CHG Soap to your body ONLY FROM THE NECK DOWN.   Do not use on face/ open                           Wound or open sores. Avoid contact with eyes, ears mouth and genitals (private parts).                       Wash face,  Genitals (private parts) with your normal soap.             6.  Wash thoroughly, paying special attention to the area where your surgery  will be performed.  7.  Thoroughly rinse your body with warm water from the neck down.  8.  DO NOT shower/wash with your normal soap after using and rinsing off  the CHG Soap.                9.  Pat yourself dry with a clean towel.            10.  Wear clean pajamas.            11.  Place clean sheets on your bed the night of your first shower and do not  sleep with pets. Day of Surgery : Do not apply any lotions/deodorants the morning of surgery.  Please wear clean clothes to the hospital/surgery center.  FAILURE TO FOLLOW THESE INSTRUCTIONS MAY RESULT IN THE CANCELLATION OF YOUR SURGERY PATIENT SIGNATURE_________________________________  NURSE SIGNATURE__________________________________  ________________________________________________________________________   Edward [name]  An incentive spirometer is a tool that can help keep your lungs clear and active. This tool measures how well you are filling your lungs with  each breath. Taking long deep breaths may help reverse or decrease the chance of developing breathing (pulmonary) problems (especially infection) following:  A long period of time when you are unable to move or be active. BEFORE THE PROCEDURE   If the spirometer includes an indicator to show your best effort, your nurse or respiratory therapist will set it to a desired goal.  If possible, sit up straight or lean slightly forward. Try not to slouch.  Hold the incentive spirometer in an upright position. INSTRUCTIONS FOR USE  1. Sit on the edge of your bed if possible, or sit up as far as you can in bed or on a chair. 2. Hold the incentive spirometer in an upright position. 3. Breathe out normally. 4. Place the mouthpiece in your mouth and seal your lips tightly around it. 5. Breathe in slowly and as deeply as possible, raising the piston or the ball toward the top of the column. 6. Hold your breath for 3-5 seconds or for as long as possible. Allow the piston or ball to fall to the bottom of the column. 7. Remove the mouthpiece from your mouth and breathe out normally. 8. Rest for a few seconds and repeat Steps 1 through 7 at least 10 times every 1-2 hours when you are awake. Take your time and take a few normal breaths between deep breaths. 9. The spirometer may include an indicator to show your best effort. Use the indicator as a goal to work toward during each repetition. 10. After each set of 10 deep breaths, practice coughing to be sure your lungs are clear. If you have an incision (the cut made at the time of surgery), support your incision  when coughing by placing a pillow or rolled up towels firmly against it. Once you are able to get out of bed, walk around indoors and cough well. You may stop using the incentive spirometer when instructed by your caregiver.  RISKS AND COMPLICATIONS  Take your time so you do not get dizzy or light-headed.  If you are in pain, you may need to take or  ask for pain medication before doing incentive spirometry. It is harder to take a deep breath if you are having pain. AFTER USE  Rest and breathe slowly and easily.  It can be helpful to keep track of a log of your progress. Your caregiver can provide you with a simple table to help with this. If you are using the spirometer at home, follow these instructions: [place] IF:   You are having difficultly using the spirometer.  You have trouble using the spirometer as often as instructed.  Your pain medication is not giving enough relief while using the spirometer.  You develop fever of 100.5 F (38.1 C) or higher. SEEK IMMEDIATE MEDICAL CARE IF:   You cough up bloody sputum that had not been present before.  You develop fever of 102 F (38.9 C) or greater.  You develop worsening pain at or near the incision site. MAKE SURE YOU:   Understand these instructions.  Will watch your condition.  Will get help right away if you are not doing well or get worse. Document Released: 09/26/2006 Document Revised: 08/08/2011 Document Reviewed: 11/27/2006 [facility] Patient Information 2014 [place], [place].   ________________________________________________________________________

## 2015-03-26 NOTE — Progress Notes (Signed)
06/19/2014-Transthoracic Echo on chart from [facility]. 01/19/2015-Office visit from Dr. Wynonia Lawman on chart.  03/23/2015-EKG from Dr. Wynonia Lawman on chart.

## 2015-03-27 ENCOUNTER — Encounter (HOSPITAL_COMMUNITY)
Admission: RE | Admit: 2015-03-27 | Discharge: 2015-03-27 | Disposition: A | Discharge status: Home / Self Care | Payer: Medicare Other | Source: Ambulatory Visit | Attending: Urology | Admitting: Urology

## 2015-03-27 ENCOUNTER — Encounter (HOSPITAL_COMMUNITY): Payer: Self-pay

## 2015-03-27 DIAGNOSIS — Z01818 Encounter for other preprocedural examination: Secondary | ICD-10-CM | POA: Insufficient documentation

## 2015-03-27 DIAGNOSIS — C679 Malignant neoplasm of bladder, unspecified: Secondary | ICD-10-CM | POA: Insufficient documentation

## 2015-03-27 HISTORY — DX: Presence of cardiac pacemaker: Z95.0

## 2015-03-27 LAB — BASIC METABOLIC PANEL
ANION GAP: 9 (ref 5–15)
BUN: 43 mg/dL — AB (ref 6–20)
CALCIUM: 8.6 mg/dL — AB (ref 8.9–10.3)
CO2: 28 mmol/L (ref 22–32)
Chloride: 100 mmol/L — ABNORMAL LOW (ref 101–111)
Creatinine, Ser: 1.97 mg/dL — ABNORMAL HIGH (ref 0.61–1.24)
GFR calc Af Amer: 34 mL/min — ABNORMAL LOW (ref >60)
GFR, EST NON AFRICAN AMERICAN: 30 mL/min — AB (ref >60)
Glucose, Bld: 110 mg/dL — ABNORMAL HIGH (ref 65–99)
POTASSIUM: 3.1 mmol/L — AB (ref 3.5–5.1)
SODIUM: 137 mmol/L (ref 135–145)

## 2015-03-27 LAB — CBC
HCT: 29.7 % — ABNORMAL LOW (ref 39.0–52.0)
Hemoglobin: 9.6 g/dL — ABNORMAL LOW (ref 13.0–17.0)
MCH: 28.3 pg (ref 26.0–34.0)
MCHC: 32.3 g/dL (ref 30.0–36.0)
MCV: 87.6 fL (ref 78.0–100.0)
PLATELETS: 243 10*3/uL (ref 150–400)
RBC: 3.39 MIL/uL — AB (ref 4.22–5.81)
RDW: 16.1 % — ABNORMAL HIGH (ref 11.5–15.5)
WBC: 5.8 10*3/uL (ref 4.0–10.5)

## 2015-03-27 LAB — APTT: APTT: 45 s — AB (ref 24–37)

## 2015-03-27 LAB — PROTIME-INR
INR: 2.48 — ABNORMAL HIGH (ref 0.00–1.49)
PROTHROMBIN TIME: 26.5 s — AB (ref 11.6–15.2)

## 2015-03-27 NOTE — Pre-Procedure Instructions (Addendum)
Transthoracic Echo 06-19-14 on chart CXR 06-24-14 epic Office Visit Dr. Wynonia Lawman 01-19-15 on chart CT of A/P 01-21-15 epic EKG from Dr. Wynonia Lawman 03-23-15 on chart Pacemaker device check and LOV Dr. Caryl Comes 12-31-14 epic  Dr. Wynonia Lawman is pt's primary cardiologist. Dr. Caryl Comes follows pt's pacemaker.  Spoke with Dr. Marcell Barlow regarding 03/23/15 notes from Dr. Wynonia Lawman Office visit.  Dr. Wynonia Lawman recommends pt increase lasix to 2 times daily.  Will question pt's current lasix dose when he arrives to pre-op visit.  Pt states he is now taking 40mg  Lasix 2 times a day per Dr. Thurman Coyer orders.  Will let pharmacy know to correct PTA meds.  Lasix is updated in PTA meds to pt's current frequency.  Multiple abnormal lab values from today's pre-op visit. Routed values to Dr. Diona Fanti.

## 2015-04-02 ENCOUNTER — Observation Stay (HOSPITAL_COMMUNITY)
Admission: RE | Admit: 2015-04-02 | Discharge: 2015-04-03 | Disposition: A | Discharge status: Home Health | Payer: Medicare Other | Source: Ambulatory Visit | Attending: Urology | Admitting: Urology

## 2015-04-02 ENCOUNTER — Encounter (HOSPITAL_COMMUNITY)
Admission: RE | Disposition: A | Discharge status: Home Health | Payer: Self-pay | Source: Ambulatory Visit | Attending: Urology

## 2015-04-02 ENCOUNTER — Ambulatory Visit (HOSPITAL_COMMUNITY): Payer: Medicare Other | Admitting: Anesthesiology

## 2015-04-02 ENCOUNTER — Encounter (HOSPITAL_COMMUNITY): Payer: Self-pay | Admitting: *Deleted

## 2015-04-02 DIAGNOSIS — I4891 Unspecified atrial fibrillation: Secondary | ICD-10-CM | POA: Insufficient documentation

## 2015-04-02 DIAGNOSIS — M109 Gout, unspecified: Secondary | ICD-10-CM | POA: Diagnosis not present

## 2015-04-02 DIAGNOSIS — Z885 Allergy status to narcotic agent status: Secondary | ICD-10-CM | POA: Diagnosis not present

## 2015-04-02 DIAGNOSIS — I130 Hypertensive heart and chronic kidney disease with heart failure and stage 1 through stage 4 chronic kidney disease, or unspecified chronic kidney disease: Secondary | ICD-10-CM | POA: Diagnosis not present

## 2015-04-02 DIAGNOSIS — I5032 Chronic diastolic (congestive) heart failure: Secondary | ICD-10-CM | POA: Diagnosis not present

## 2015-04-02 DIAGNOSIS — Z881 Allergy status to other antibiotic agents status: Secondary | ICD-10-CM | POA: Insufficient documentation

## 2015-04-02 DIAGNOSIS — E785 Hyperlipidemia, unspecified: Secondary | ICD-10-CM | POA: Insufficient documentation

## 2015-04-02 DIAGNOSIS — C679 Malignant neoplasm of bladder, unspecified: Principal | ICD-10-CM | POA: Insufficient documentation

## 2015-04-02 DIAGNOSIS — I4892 Unspecified atrial flutter: Secondary | ICD-10-CM | POA: Insufficient documentation

## 2015-04-02 DIAGNOSIS — N3289 Other specified disorders of bladder: Secondary | ICD-10-CM | POA: Insufficient documentation

## 2015-04-02 DIAGNOSIS — C673 Malignant neoplasm of anterior wall of bladder: Secondary | ICD-10-CM

## 2015-04-02 DIAGNOSIS — Z951 Presence of aortocoronary bypass graft: Secondary | ICD-10-CM | POA: Insufficient documentation

## 2015-04-02 DIAGNOSIS — F329 Major depressive disorder, single episode, unspecified: Secondary | ICD-10-CM | POA: Diagnosis not present

## 2015-04-02 DIAGNOSIS — Z950 Presence of cardiac pacemaker: Secondary | ICD-10-CM | POA: Diagnosis not present

## 2015-04-02 DIAGNOSIS — N400 Enlarged prostate without lower urinary tract symptoms: Secondary | ICD-10-CM | POA: Insufficient documentation

## 2015-04-02 DIAGNOSIS — I739 Peripheral vascular disease, unspecified: Secondary | ICD-10-CM | POA: Insufficient documentation

## 2015-04-02 DIAGNOSIS — I714 Abdominal aortic aneurysm, without rupture: Secondary | ICD-10-CM | POA: Insufficient documentation

## 2015-04-02 DIAGNOSIS — I252 Old myocardial infarction: Secondary | ICD-10-CM | POA: Insufficient documentation

## 2015-04-02 DIAGNOSIS — Z888 Allergy status to other drugs, medicaments and biological substances status: Secondary | ICD-10-CM | POA: Diagnosis not present

## 2015-04-02 DIAGNOSIS — I2510 Atherosclerotic heart disease of native coronary artery without angina pectoris: Secondary | ICD-10-CM | POA: Insufficient documentation

## 2015-04-02 DIAGNOSIS — N183 Chronic kidney disease, stage 3 (moderate): Secondary | ICD-10-CM | POA: Diagnosis not present

## 2015-04-02 DIAGNOSIS — Z87891 Personal history of nicotine dependence: Secondary | ICD-10-CM | POA: Diagnosis not present

## 2015-04-02 DIAGNOSIS — I447 Left bundle-branch block, unspecified: Secondary | ICD-10-CM | POA: Insufficient documentation

## 2015-04-02 DIAGNOSIS — J449 Chronic obstructive pulmonary disease, unspecified: Secondary | ICD-10-CM | POA: Insufficient documentation

## 2015-04-02 HISTORY — DX: Malignant neoplasm of bladder, unspecified: C67.9

## 2015-04-02 HISTORY — PX: TRANSURETHRAL RESECTION OF BLADDER TUMOR: SHX2575

## 2015-04-02 SURGERY — TURBT (TRANSURETHRAL RESECTION OF BLADDER TUMOR)
Anesthesia: General

## 2015-04-02 MED ORDER — LACTATED RINGERS IV SOLN
INTRAVENOUS | Status: DC
  Administered 2015-04-02: 1000 mL via INTRAVENOUS
  Administered 2015-04-02: 12:00:00 via INTRAVENOUS

## 2015-04-02 MED ORDER — TIOTROPIUM BROMIDE MONOHYDRATE 18 MCG IN CAPS
18.0000 ug | ORAL_CAPSULE | Freq: Every day | RESPIRATORY_TRACT | Status: DC
  Administered 2015-04-03: 18 ug via RESPIRATORY_TRACT
  Filled 2015-04-02: qty 5

## 2015-04-02 MED ORDER — LIDOCAINE HCL (CARDIAC) 20 MG/ML IV SOLN
INTRAVENOUS | Status: DC | PRN
  Administered 2015-04-02: 50 mg via INTRAVENOUS

## 2015-04-02 MED ORDER — CEFAZOLIN SODIUM-DEXTROSE 2-3 GM-% IV SOLR
INTRAVENOUS | Status: AC
  Filled 2015-04-02: qty 50

## 2015-04-02 MED ORDER — FUROSEMIDE 40 MG PO TABS
40.0000 mg | ORAL_TABLET | Freq: Two times a day (BID) | ORAL | Status: DC
  Administered 2015-04-02 – 2015-04-03 (×2): 40 mg via ORAL
  Filled 2015-04-02: qty 1

## 2015-04-02 MED ORDER — OXYBUTYNIN CHLORIDE 5 MG PO TABS
5.0000 mg | ORAL_TABLET | Freq: Three times a day (TID) | ORAL | Status: DC | PRN
  Administered 2015-04-02: 5 mg via ORAL
  Filled 2015-04-02: qty 1

## 2015-04-02 MED ORDER — PROPOFOL 10 MG/ML IV BOLUS
INTRAVENOUS | Status: AC
  Filled 2015-04-02: qty 20

## 2015-04-02 MED ORDER — CEPHALEXIN 500 MG PO CAPS: 500.0000 mg | ORAL_CAPSULE | Freq: Two times a day (BID) | ORAL | Status: DC

## 2015-04-02 MED ORDER — PHENYLEPHRINE 40 MCG/ML (10ML) SYRINGE FOR IV PUSH (FOR BLOOD PRESSURE SUPPORT)
PREFILLED_SYRINGE | INTRAVENOUS | Status: AC
  Filled 2015-04-02: qty 10

## 2015-04-02 MED ORDER — FENTANYL CITRATE (PF) 100 MCG/2ML IJ SOLN
INTRAMUSCULAR | Status: AC
  Filled 2015-04-02: qty 4

## 2015-04-02 MED ORDER — ACETAMINOPHEN 325 MG PO TABS: 650.0000 mg | ORAL_TABLET | ORAL | Status: DC | PRN

## 2015-04-02 MED ORDER — TIOTROPIUM BROMIDE MONOHYDRATE 18 MCG IN CAPS
18.0000 ug | ORAL_CAPSULE | Freq: Every day | RESPIRATORY_TRACT | Status: DC
  Filled 2015-04-02: qty 5

## 2015-04-02 MED ORDER — ZOLPIDEM TARTRATE 5 MG PO TABS: 5.0000 mg | ORAL_TABLET | Freq: Every evening | ORAL | Status: DC | PRN

## 2015-04-02 MED ORDER — STERILE WATER FOR IRRIGATION IR SOLN
Status: DC | PRN
  Administered 2015-04-02: 6000 mL

## 2015-04-02 MED ORDER — ROSUVASTATIN CALCIUM 20 MG PO TABS
20.0000 mg | ORAL_TABLET | Freq: Every day | ORAL | Status: DC
  Administered 2015-04-02 – 2015-04-03 (×2): 20 mg via ORAL
  Filled 2015-04-02 (×2): qty 1

## 2015-04-02 MED ORDER — PROPOFOL 10 MG/ML IV BOLUS
INTRAVENOUS | Status: DC | PRN
  Administered 2015-04-02: 110 mg via INTRAVENOUS

## 2015-04-02 MED ORDER — NITROGLYCERIN 0.4 MG SL SUBL: 0.4000 mg | SUBLINGUAL_TABLET | SUBLINGUAL | Status: DC | PRN

## 2015-04-02 MED ORDER — POLYETHYLENE GLYCOL 3350 17 G PO PACK
17.0000 g | PACK | Freq: Every day | ORAL | Status: DC
  Administered 2015-04-02 – 2015-04-03 (×2): 17 g via ORAL
  Filled 2015-04-02 (×2): qty 1

## 2015-04-02 MED ORDER — HYDROCODONE-ACETAMINOPHEN 5-325 MG PO TABS
1.0000 | ORAL_TABLET | ORAL | Status: DC | PRN
  Administered 2015-04-02: 1 via ORAL
  Filled 2015-04-02: qty 1

## 2015-04-02 MED ORDER — LIDOCAINE HCL (CARDIAC) 20 MG/ML IV SOLN
INTRAVENOUS | Status: AC
  Filled 2015-04-02: qty 5

## 2015-04-02 MED ORDER — PHENYLEPHRINE HCL 10 MG/ML IJ SOLN
INTRAMUSCULAR | Status: DC | PRN
  Administered 2015-04-02 (×2): 80 ug via INTRAVENOUS

## 2015-04-02 MED ORDER — ONDANSETRON HCL 4 MG/2ML IJ SOLN: 4.0000 mg | INTRAMUSCULAR | Status: DC | PRN

## 2015-04-02 MED ORDER — MOMETASONE FURO-FORMOTEROL FUM 100-5 MCG/ACT IN AERO
2.0000 | INHALATION_SPRAY | Freq: Two times a day (BID) | RESPIRATORY_TRACT | Status: DC
  Administered 2015-04-02 – 2015-04-03 (×2): 2 via RESPIRATORY_TRACT
  Filled 2015-04-02: qty 8.8

## 2015-04-02 MED ORDER — TAMSULOSIN HCL 0.4 MG PO CAPS
0.4000 mg | ORAL_CAPSULE | Freq: Every day | ORAL | Status: DC
  Administered 2015-04-02: 0.4 mg via ORAL
  Filled 2015-04-02 (×2): qty 1

## 2015-04-02 MED ORDER — FENTANYL CITRATE (PF) 100 MCG/2ML IJ SOLN: 25.0000 ug | INTRAMUSCULAR | Status: DC | PRN

## 2015-04-02 MED ORDER — TRIPLE ANTIBIOTIC 3.5-400-5000 EX OINT
1.0000 "application " | TOPICAL_OINTMENT | Freq: Three times a day (TID) | CUTANEOUS | Status: DC | PRN
  Filled 2015-04-02: qty 1

## 2015-04-02 MED ORDER — CEPHALEXIN 250 MG PO CAPS
250.0000 mg | ORAL_CAPSULE | Freq: Two times a day (BID) | ORAL | Status: DC
  Administered 2015-04-02 – 2015-04-03 (×2): 250 mg via ORAL
  Filled 2015-04-02 (×2): qty 1

## 2015-04-02 MED ORDER — SODIUM CHLORIDE 0.45 % IV SOLN
INTRAVENOUS | Status: DC
  Administered 2015-04-02: 15:00:00 via INTRAVENOUS

## 2015-04-02 MED ORDER — CEFAZOLIN SODIUM-DEXTROSE 2-3 GM-% IV SOLR
2.0000 g | INTRAVENOUS | Status: AC
  Administered 2015-04-02: 2 g via INTRAVENOUS

## 2015-04-02 SURGICAL SUPPLY — 20 items
BAG URINE DRAINAGE (UROLOGICAL SUPPLIES) ×3 IMPLANT
BAG URO CATCHER STRL LF (DRAPE) ×3 IMPLANT
CATH FOLEY 2WAY SLVR  5CC 20FR (CATHETERS) ×2
CATH FOLEY 2WAY SLVR 5CC 20FR (CATHETERS) ×1 IMPLANT
DRAPE CAMERA CLOSED 9X96 (DRAPES) ×3 IMPLANT
ELECT REM PT RETURN 9FT ADLT (ELECTROSURGICAL) ×3
ELECTRODE REM PT RTRN 9FT ADLT (ELECTROSURGICAL) ×1 IMPLANT
EVACUATOR MICROVAS BLADDER (UROLOGICAL SUPPLIES) IMPLANT
GLOVE BIOGEL M 8.0 STRL (GLOVE) ×3 IMPLANT
GOWN STRL REUS W/ TWL XL LVL3 (GOWN DISPOSABLE) ×1 IMPLANT
GOWN STRL REUS W/TWL XL LVL3 (GOWN DISPOSABLE) ×6 IMPLANT
KIT ASPIRATION TUBING (SET/KITS/TRAYS/PACK) IMPLANT
LOOP CUT BIPOLAR 24F LRG (ELECTROSURGICAL) IMPLANT
LOOP MONOPOLAR YLW (ELECTROSURGICAL) ×3 IMPLANT
MANIFOLD NEPTUNE II (INSTRUMENTS) ×3 IMPLANT
PACK CYSTO (CUSTOM PROCEDURE TRAY) ×3 IMPLANT
SYRINGE IRR TOOMEY STRL 70CC (SYRINGE) IMPLANT
TUBING CONNECTING 10 (TUBING) ×2 IMPLANT
TUBING CONNECTING 10' (TUBING) ×1
WATER STERILE IRR 3000ML UROMA (IV SOLUTION) IMPLANT

## 2015-04-02 NOTE — Discharge Instructions (Signed)

## 2015-04-02 NOTE — Anesthesia Procedure Notes (Signed)
Procedure Name: LMA Insertion Date/Time: 04/02/2015 12:54 PM Performed by: [place] Michael Walrath C Pre-anesthesia Checklist: Patient identified, Emergency Drugs available, Suction available, Patient being monitored and Timeout performed Patient Re-evaluated:Patient Re-evaluated prior to inductionOxygen Delivery Method: Circle system utilized Preoxygenation: Pre-oxygenation with 100% oxygen Intubation Type: IV induction LMA: LMA inserted LMA Size: 4.0 Number of attempts: 1 Placement Confirmation: breath sounds checked- equal and bilateral and positive ETCO2 Tube secured with: Tape Dental Injury: Teeth and Oropharynx as per pre-operative assessment

## 2015-04-02 NOTE — Progress Notes (Signed)
Foley catheter irrigated with 70 cc Normal Saline- returns red with few clots-draining well

## 2015-04-02 NOTE — Progress Notes (Signed)
Pharmacy - Renal abx adjust  Assessment:  3 yoM ordered Keflex 500 mg PO bid postoperatively following TURBT.  CrCl 24.6 ml/min (SCr 1.97, baseline ~1.5)  Plan: Reduce Keflex to 250 mg PO q12 hr F/u SCr  Reuel Boom, PharmD, BCPS Pager: [phone] 04/02/2015, 3:52 PM

## 2015-04-02 NOTE — Anesthesia Preprocedure Evaluation (Signed)
Anesthesia Evaluation  Patient identified by MRN, date of birth, ID band Patient awake    Reviewed: Allergy & Precautions, H&P , NPO status , Patient's Chart, lab work & pertinent test results, reviewed documented beta blocker date and time   History of Anesthesia Complications Negative for: history of anesthetic complications  Airway Mallampati: II  TM Distance: >3 FB Neck ROM: Full    Dental no notable dental hx. (+) Dental Advisory Given, Upper Dentures, Lower Dentures   Pulmonary neg pulmonary ROS, COPD, former smoker,    Pulmonary exam normal breath sounds clear to auscultation       Cardiovascular Exercise Tolerance: Good + CAD, + Past MI and + Peripheral Vascular Disease  Normal cardiovascular exam+ dysrhythmias Atrial Fibrillation + pacemaker  Rhythm:Irregular Rate:Normal  AAA   Neuro/Psych PSYCHIATRIC DISORDERS Depression negative neurological ROS  negative psych ROS   GI/Hepatic negative GI ROS, Neg liver ROS,   Endo/Other  negative endocrine ROS  Renal/GU Renal diseasenegative Renal ROS  negative genitourinary   Musculoskeletal   Abdominal   Peds  Hematology negative hematology ROS (+)   Anesthesia Other Findings   Reproductive/Obstetrics negative OB ROS                             Anesthesia Physical Anesthesia Plan  ASA: III  Anesthesia Plan: General   Post-op Pain Management:    Induction: Intravenous  Airway Management Planned: LMA  Additional Equipment:   Intra-op Plan:   Post-operative Plan:   Informed Consent: I have reviewed the patients History and Physical, chart, labs and discussed the procedure including the risks, benefits and alternatives for the proposed anesthesia with the patient or authorized representative who has indicated his/her understanding and acceptance.   Dental Advisory Given  Plan Discussed with: CRNA and Surgeon  Anesthesia Plan  Comments:         Anesthesia Quick Evaluation

## 2015-04-02 NOTE — Care Management Note (Signed)
Case Management Note  Patient Details  Name: Edward [name] MRN: [id number] Date of Birth: April 18, 1930  Subjective/Objective:   81 y/o m admitted w/Bladder Ca. S/p TURP. From home.                 Action/Plan:d/c plan home.   Expected Discharge Date:                  Expected Discharge Plan:  Home/Self Care  In-House Referral:     Discharge planning Services  CM Consult  Post Acute Care Choice:    Choice offered to:     DME Arranged:    DME Agency:     HH Arranged:    HH Agency:     Status of Service:  In process, will continue to follow  Medicare Important Message Given:    Date Medicare IM Given:    Medicare IM give by:    Date Additional Medicare IM Given:    Additional Medicare Important Message give by:     If discussed at [place] of Stay Meetings, dates discussed:    Additional Comments:  Dessa Phi, RN 04/02/2015, 3:33 PM

## 2015-04-02 NOTE — Anesthesia Postprocedure Evaluation (Signed)
  Anesthesia Post-op Note  Patient: Edward [name]  Procedure(s) Performed: Procedure(s) (LRB): TRANSURETHRAL RESECTION OF BLADDER TUMOR (TURBT) (N/A)  Patient Location: PACU  Anesthesia Type: General  Level of Consciousness: awake and alert   Airway and Oxygen Therapy: Patient Spontanous Breathing  Post-op Pain: mild  Post-op Assessment: Post-op Vital signs reviewed, Patient's Cardiovascular Status Stable, Respiratory Function Stable, Patent Airway and No signs of Nausea or vomiting  Last Vitals:  Filed Vitals:   04/02/15 1445  BP: 114/72  Pulse: 76  Temp:   Resp: 23    Post-op Vital Signs: stable   Complications: No apparent anesthesia complications

## 2015-04-02 NOTE — Transfer of Care (Signed)
Immediate Anesthesia Transfer of Care Note  Patient: Edward [name]  Procedure(s) Performed: Procedure(s) with comments: TRANSURETHRAL RESECTION OF BLADDER TUMOR (TURBT) (N/A) - CYSTOSCOPY     Patient Location: PACU  Anesthesia Type:General  Level of Consciousness: awake and alert   Airway & Oxygen Therapy: Patient Spontanous Breathing and Patient connected to face mask oxygen  Post-op Assessment: Report given to RN and Post -op Vital signs reviewed and stable  Post vital signs: Reviewed and stable  Last Vitals:  Filed Vitals:   04/02/15 1048  BP: 106/84  Pulse: 90  Temp: 36.4 C  Resp: 18    Complications: No apparent anesthesia complications

## 2015-04-02 NOTE — Op Note (Signed)
PATIENT:  Edward [name]  PRE-OPERATIVE DIAGNOSIS: Recurrent bladder cancer  POST-OPERATIVE DIAGNOSIS: Same  PROCEDURE: Cystoscopy, TURBT 2 cm bladder tumor  SURGEON:  Lillette Boxer. Dalores Weger, M.D.  ANESTHESIA:  General  EBL:  Minimal  DRAINS: 20 French Foley catheter  LOCAL MEDICATIONS USED:  None  SPECIMEN:  Bladder tumor, to pathology  INDICATION: Edward [name] is an elderly gentleman presenting for cystoscopy and TURBT. He underwent TURBT of a fairly large bladder tumor on 06/16/2014. Postoperatively, he had a complication of hospital-acquired pneumonia after the anesthetic as well as atrial flutter. He was treated first with continuous bladder irrigation for bladder clots which eventually resolved. Prior to his discharge which was about 11-12 days after his TURBT, he was placed on Coumadin because of his atrial flutter.    Because of his bladder cancer, he underwent BCG induction. He completed 6 treatments on 08/27/2014.  Recent cystoscopy revealed a nodular recurrence on the left side of the bladder.  Description of procedure: The patient was properly identified and marked (if applicable) in the holding area. They were then  taken to the operating room and placed on the table in a supine position. General anesthesia was then administered. Once fully anesthetized the patient was moved to the dorsolithotomy position and the genitalia and perineum were sterilely prepped and draped in standard fashion. An official timeout was then performed.  His urethra was dilated to 30 [place] with [employer]. Following that, a 78 French resectoscope sheath was passed with the obturator. 30 lens was placed with the resectoscope and cutting loop. The bladder was inspected circumferentially. Ureteral orifices were normal. No bladder lesions were seen except for an 18-20 mm lesion anteriorly, just to the left of the midline near the bladder neck. This was partially necrotic. Surrounding urothelium  appeared to be normal. No other lesions were seen. Using the loop, I then resected this bladder tumor, down to the muscular fibers. Tissue was then saved for pathology and sent to the lab. This was labeled "bladder tumor". The base of the bladder tumor was cauterized. No bleeding was noted at this point. There being no further bladder lesions, the procedure was terminated. The scope was removed. I placed a 20 French Foley catheter and hooked this to dependent drainage, with 10 mL of water placed in the balloon. The patient tolerated procedure well. He was awakened and taken to the PACU in stable condition.    PLAN OF CARE: Discharge to home after PACU  PATIENT DISPOSITION:  PACU - hemodynamically stable.

## 2015-04-02 NOTE — H&P (Signed)
Urology History and Physical Exam  CC: bladder cancer  HPI: 81 year old male presents for repeat TURBT for recurrence of non-muscle invasive bladder cancer.  His history as below:  He underwent TURBT of a fairly large bladder tumor on 06/16/2014. Postoperatively, he had a complication of hospital-acquired pneumonia after the anesthetic as well as atrial flutter. He was treated first with continuous bladder irrigation for bladder clots which eventually resolved. Prior to his discharge which was about 11-12 days after his TURBT, he was placed on Coumadin because of his atrial flutter.   Because of his bladder cancer, he underwent BCG induction. He completed 6 treatments on 08/27/2014.  Recent cystoscopy revealed a nodular recurrence on the left side of the bladder.   PMH: Past Medical History  Diagnosis Date  . Gout     "only once in my lifetime" (12/18/2012)  . Depression   . PVD (peripheral vascular disease) ([place])     s/p B CEA  . COPD (chronic obstructive pulmonary disease) ([place])      PFT 6.19.07: FEV1 465  ratio 60 with 25% response to B2.  > PFTs 8.29.07: FEV1 61%  ratio 46% no better after B2.  > add on advair 12.20.11-improved 1.31.12  . AAA (abdominal aortic aneurysm) ([place])   . Melanosis coli   . Internal hemorrhoids   . Carotid artery occlusion   . Aortic valve disorder   . Chronic diastolic heart failure ([place]) 03/30/2009  . CAD (coronary artery disease), native coronary artery     CABG w LIMA to LAD, SVG to dx, OM, RCA 1989 Dr. Arlyce Dice for 3VD PTCA of OM, 1999 and 2000 Cath showed occlusion of left main and RCA with stenosis in OM Redo redo CABG w SVG to OM, SVG to RCA8/10/00 Dr. Cyndia Bent   . Hyperlipidemia   . BPH (benign prostatic hypertrophy)   . Hypertensive heart disease     Change toprol to bisoprolol 10 mg daily on trial basis  05/21/2012  - notified by [facility] non adherent 07/17/2012    . LBBB (left bundle branch block)   . Second degree heart block 12/18/2012    s/p MDT  Adapta L pacemaker 12-20-2012 by Dr Caryl Comes  . Shingles years ago    mild  . Abrasion of left forearm dec 2015    small scraped area healing  . Chronic kidney disease stage III (GFR 30-59 ml/min)     saw dr Justin Mend oct 2015 and released by dr webb  . Congenital absence of kidney     "noted during AAA repair; never knew it before" (12/18/2012)  . Cancer ([place]) 05/2014    bladder, s/p uro surg resection  . Anginal pain ([place])   . Myocardial infarction ([place]) 1979    times 2  . Presence of permanent cardiac pacemaker     PSH: Past Surgical History  Procedure Laterality Date  . Cholecystectomy  1998  . Hemiarthroplasty shoulder fracture Right ~ 2008     x 2 - [place]  . Carotid endarterectomy Bilateral 1990's  . Abdominal aortic aneurysm repair  1998  . Carotid-subclavian bypass graft Left 1990's  . Anal fissure repair      12/18/2012 "I don't remember this"  . Hemorroidectomy      12/18/2012 "I don't remember this"  . Carpal tunnel release Right 1980's    "Dr. Marrian Salvage" (12/18/2012)  . Cardiac catheterization      "2 or 3" (12/18/2012)  . Coronary angioplasty with stent placement      "  I've got 1 or 2" (12/18/2012)  . Hip fracture surgery  1944    "fell out of a tree; had it operated on 3 times" (12/18/2012)  . Refractive surgery Bilateral 1990's?  . Pacemaker insertion  12-20-2012    dual chamber Medtronic Adapta L pacemaker implanted by Dr Caryl Comes  . Cardioversion N/A 12/30/2013    Procedure: CARDIOVERSION;  Surgeon: Jacolyn Reedy, MD;  Location: [facility];  Service: Cardiovascular;  Laterality: N/A;  . Permanent pacemaker insertion N/A 12/20/2012    Procedure: PERMANENT PACEMAKER INSERTION;  Surgeon: Deboraha Sprang, MD;  Location: [facility] CATH LAB;  Service: Cardiovascular;  Laterality: N/A;  . Coronary artery bypass graft  1989, 2000     "CABG X ?3; CABG X 5 w//Bartle" (12/18/2012)  . Transurethral resection of bladder tumor N/A 06/16/2014    Procedure: TRANSURETHRAL RESECTION OF  BLADDER TUMOR (TURBT) ;  Surgeon: Jorja Loa, MD;  Location: [facility];  Service: Urology;  Laterality: N/A;  . Cardioversion N/A 09/26/2014    Procedure: CARDIOVERSION;  Surgeon: Jacolyn Reedy, MD;  Location: [facility];  Service: Cardiovascular;  Laterality: N/A;  . Eye surgery      bil catarcts with lens implants on right eye    Allergies: Allergies  Allergen Reactions  . Ace Inhibitors Cough  . Clarithromycin Other (See Comments)    Strange thoughts and fell with biaxin  . Codeine Nausea And Vomiting  . Oxycodone-Acetaminophen Other (See Comments)    Felt closed in  . Viberzi [Eluxadoline] Other (See Comments)    Severe cramps and impaction    Medications: Prescriptions prior to admission  Medication Sig Dispense Refill Last Dose  . DULERA 100-5 MCG/ACT AERO Inhale 2 puffs into the lungs 2 (two) times daily. 3 Inhaler 3 01/21/2015 at Unknown time  . furosemide (LASIX) 40 MG tablet Take 40 mg by mouth 2 (two) times daily.    01/21/2015 at Unknown time  . nitroGLYCERIN (NITROSTAT) 0.4 MG SL tablet Place 0.4 mg under the tongue every 5 (five) minutes as needed for chest pain.   unknown  . polyethylene glycol (MIRALAX / GLYCOLAX) packet Take 17 g by mouth daily. 14 each 0   . rosuvastatin (CRESTOR) 20 MG tablet Take 20 mg by mouth daily.     01/21/2015 at Unknown time  . silodosin (RAPAFLO) 8 MG CAPS capsule Take 8 mg by mouth every evening.     . tiotropium (SPIRIVA) 18 MCG inhalation capsule Place 1 capsule (18 mcg total) into inhaler and inhale daily. 90 capsule 3   . warfarin (COUMADIN) 1 MG tablet Take 1 tablet (1 mg total) by mouth daily. Continue taking Coumadin and have PT/INR checked 06/30/2014 to have the dose adjusted appropriately (Patient taking differently: Take 1 mg by mouth daily. Alternates taking 0.5mg  and 1mg  each day.) 30 tablet 0 01/20/2015 at 1830  . amoxicillin-clavulanate (AUGMENTIN) 875-125 MG per tablet Take 1 tablet by mouth 2 (two) times daily. One po  bid x 7 days (Patient not taking: Reported on 03/23/2015) 14 tablet 0   . glycerin adult (GLYCERIN ADULT) 2 G SUPP Place 1 suppository rectally once. (Patient not taking: Reported on 03/23/2015) 10 suppository 0   . HYDROcodone-acetaminophen (NORCO/VICODIN) 5-325 MG per tablet take 1/2 tablet by mouth every 6 hours if needed (Patient not taking: Reported on 01/21/2015) 20 tablet 0 Not Taking at Unknown time     Social History: Social History   Social History  . Marital Status: Widowed  Spouse Name: N/A  . Number of Children: N/A  . Years of Education: N/A   Occupational History  . retired from [employer]  . Smoking status: Former Smoker -- 1.50 packs/day for 50 years    Types: Cigarettes    Quit date: 05/30/1996  . Smokeless tobacco: Never Used  . Alcohol Use: 1.8 oz/week    3 Cans of beer per week     Comment: beer few times per week  . Drug Use: No  . Sexual Activity: No   Other Topics Concern  . Not on file   Social History Narrative   Lives alone. Widower.    Family History: Family History  Problem Relation Age of Onset  . Heart disease Mother   . Colon cancer Neg Hx     Review of Systems: Positive: 65 pound weight loss, weakness, fatigue Negative:   A further 10 point review of systems was negative except what is listed in the HPI.                  Physical Exam: @VITALS2 @ General: No acute distress.  Awake. Head:  Normocephalic.  Atraumatic. ENT:  EOMI.  Mucous membranes moist Neck:  Supple.  No lymphadenopathy. CV:  S1 present. S2 present. Regular rate. Pulmonary: Equal effort bilaterally.  Clear to auscultation bilaterally. Abdomen: Soft.  non tender to palpation. Skin:  Normal turgor.  No visible rash. Extremity: No gross deformity of bilateral upper extremities.  No gross deformity of                             lower extremities. Neurologic: Alert. Appropriate mood.    Studies:  No results for input(s): HGB, WBC,  PLT in the last 72 hours.  No results for input(s): NA, K, CL, CO2, BUN, CREATININE, CALCIUM, GFRNONAA, GFRAA in the last 72 hours.  Invalid input(s): MAGNESIUM   No results for input(s): INR, APTT in the last 72 hours.  Invalid input(s): PT   Invalid input(s): ABG    Assessment:  Recurrent bladder cancer  Plan: Transurethral resection of bladder tumor, overnight stay

## 2015-04-03 DIAGNOSIS — C679 Malignant neoplasm of bladder, unspecified: Secondary | ICD-10-CM | POA: Diagnosis not present

## 2015-04-03 MED ORDER — ZOLPIDEM TARTRATE 5 MG PO TABS: 5.0000 mg | ORAL_TABLET | Freq: Every evening | ORAL | Status: DC | PRN

## 2015-04-03 MED ORDER — CEPHALEXIN 500 MG PO CAPS: 500.0000 mg | ORAL_CAPSULE | Freq: Two times a day (BID) | ORAL | Status: DC

## 2015-04-03 NOTE — Care Management Note (Signed)
Case Management Note  Patient Details  Name: GARRICK MIDGLEY MRN: [id number] Date of Birth: [birth year]  Subjective/Objective:                    Action/Plan:d/c home no needs or orders.   Expected Discharge Date:                  Expected Discharge Plan:  Home/Self Care  In-House Referral:     Discharge planning Services  CM Consult  Post Acute Care Choice:    Choice offered to:     DME Arranged:    DME Agency:     HH Arranged:    [place] Agency:     Status of Service:  Completed, signed off  Medicare Important Message Given:    Date Medicare IM Given:    Medicare IM give by:    Date Additional Medicare IM Given:    Additional Medicare Important Message give by:     If discussed at [place] of Stay Meetings, dates discussed:    Additional Comments:  Dessa Phi, RN 04/03/2015, 9:14 AM

## 2015-04-03 NOTE — Progress Notes (Signed)
Pt for discharge after lunch, once he has emptied his bladder twice, per Dr Diona Fanti

## 2015-04-03 NOTE — Progress Notes (Signed)
C/o of pressure in bladder, irrigated foley with small clot return noted, pt states pressure relieved. Will cont to monitor output. SRP, RN

## 2015-04-16 ENCOUNTER — Encounter: Payer: Self-pay | Admitting: Internal Medicine

## 2015-04-16 ENCOUNTER — Ambulatory Visit (INDEPENDENT_AMBULATORY_CARE_PROVIDER_SITE_OTHER): Payer: Medicare Other | Admitting: Internal Medicine

## 2015-04-16 VITALS — BP 90/60 | HR 111 | Temp 97.7°F | Ht 70.0 in | Wt 141.5 lb

## 2015-04-16 DIAGNOSIS — G4700 Insomnia, unspecified: Secondary | ICD-10-CM | POA: Diagnosis not present

## 2015-04-16 DIAGNOSIS — I481 Persistent atrial fibrillation: Secondary | ICD-10-CM

## 2015-04-16 DIAGNOSIS — I4819 Other persistent atrial fibrillation: Secondary | ICD-10-CM

## 2015-04-16 DIAGNOSIS — C673 Malignant neoplasm of anterior wall of bladder: Secondary | ICD-10-CM

## 2015-04-16 DIAGNOSIS — Z23 Encounter for immunization: Secondary | ICD-10-CM | POA: Diagnosis not present

## 2015-04-16 MED ORDER — DIAZEPAM 2 MG PO TABS: 2.0000 mg | ORAL_TABLET | Freq: Every evening | ORAL | Status: DC | PRN

## 2015-04-16 MED ORDER — WARFARIN SODIUM 1 MG PO TABS: 1.0000 mg | ORAL_TABLET | Freq: Every day | ORAL | Status: DC

## 2015-04-16 NOTE — Assessment & Plan Note (Signed)
New dx on PPM interrogation 2014 Cardiology changed plavix to Eliquis because of same Now on warfarin post op - INR per HH monitoring 

## 2015-04-16 NOTE — Progress Notes (Signed)
Pre visit review using our clinic review tool, if applicable. No additional management support is needed unless otherwise documented below in the visit note. 

## 2015-04-16 NOTE — Assessment & Plan Note (Signed)
S/p TU resection 06/14/14 and again 04/02/15- OP note reviewed s/p SNF rehab then home early March 2016 Struggling with failure to thrive overall OP follow up with uro to continue BCG as planned

## 2015-04-16 NOTE — Assessment & Plan Note (Signed)
Prefers temazepam to other prior meds, but ineffective since rehab spring 2016 March 2016 changed therapy to diazepam resulted in complications when patient added diazepam to Restoril rather than discontinuation of first BZ Patient reports discontinuation of all benzodiazepine's in past month - Son confirms same  no longer on low-dose hydrocodone for associated neck pain at night   trial generic Ambien since urologic surgery November 2016 cause hallucinations and fall Will cautiously resume low dose Valium - not to use with any other controlled substance - son and pt agree to same

## 2015-04-16 NOTE — Progress Notes (Signed)
Subjective:    Patient ID: Edward [name], male    DOB: 1929/11/09, 81 y.o.   MRN: [id number]  HPI  Patient here for follow up  Past Medical History  Diagnosis Date  . Gout     "only once in my lifetime" (12/18/2012)  . Depression   . PVD (peripheral vascular disease) ([place])     s/p B CEA  . COPD (chronic obstructive pulmonary disease) ([place])      PFT 6.19.07: FEV1 465  ratio 60 with 25% response to B2.  > PFTs 8.29.07: FEV1 61%  ratio 46% no better after B2.  > add on advair 12.20.11-improved 1.31.12  . AAA (abdominal aortic aneurysm) ([place])   . Melanosis coli   . Internal hemorrhoids   . Carotid artery occlusion   . Aortic valve disorder   . Chronic diastolic heart failure ([place]) 03/30/2009  . CAD (coronary artery disease), native coronary artery     CABG w LIMA to LAD, SVG to dx, OM, RCA 1989 Dr. Arlyce Dice for 3VD PTCA of OM, 1999 and 2000 Cath showed occlusion of left main and RCA with stenosis in OM Redo redo CABG w SVG to OM, SVG to RCA8/10/00 Dr. Cyndia Bent   . Hyperlipidemia   . BPH (benign prostatic hypertrophy)   . Hypertensive heart disease     Change toprol to bisoprolol 10 mg daily on trial basis  05/21/2012  - notified by [facility] non adherent 07/17/2012    . LBBB (left bundle branch block)   . Second degree heart block 12/18/2012    s/p MDT Adapta L pacemaker 12-20-2012 by Dr Caryl Comes  . Shingles years ago    mild  . Abrasion of left forearm dec 2015    small scraped area healing  . Chronic kidney disease stage III (GFR 30-59 ml/min)     saw dr Justin Mend oct 2015 and released by dr webb  . Congenital absence of kidney     "noted during AAA repair; never knew it before" (12/18/2012)  . Cancer ([place]) 05/2014    bladder, s/p uro surg resection  . Anginal pain ([place])   . Myocardial infarction ([place]) 1979    times 2  . Presence of permanent cardiac pacemaker     Review of Systems  Constitutional: Negative for fatigue and unexpected weight change.  Respiratory: Negative for cough and  shortness of breath.   Cardiovascular: Negative for chest pain. Leg swelling: tight L>R LE, uses Lasix for same   Neurological: Negative for weakness.  Psychiatric/Behavioral: Positive for confusion (on [place], ?resume Valium) and sleep disturbance (chronic). Negative for suicidal ideas, self-injury and dysphoric mood.       Objective:    Physical Exam  Constitutional: No distress.  Pt appears cachectic Younger son at side  Cardiovascular: Normal rate, regular rhythm and normal heart sounds.   No murmur heard. Pulmonary/Chest: Effort normal and breath sounds normal. No respiratory distress.  Psychiatric: He has a normal mood and affect. His behavior is normal. Thought content normal.    BP 90/60 mmHg  Pulse 111  Temp(Src) 97.7 F (36.5 C) (Oral)  Ht 5\' 10"  (1.778 m)  Wt 141 lb 8 oz (64.184 kg)  BMI 20.30 kg/m2  SpO2 89% Wt Readings from Last 3 Encounters:  04/16/15 141 lb 8 oz (64.184 kg)  04/02/15 135 lb (61.236 kg)  12/31/14 128 lb 9.6 oz (58.333 kg)     Lab Results  Component Value Date   WBC 5.8 03/27/2015   HGB  9.6* 03/27/2015   HCT 29.7* 03/27/2015   PLT 243 03/27/2015   GLUCOSE 110* 03/27/2015   CHOL 122 10/30/2013   TRIG 95.0 10/30/2013   HDL 50.30 10/30/2013   LDLCALC 53 10/30/2013   ALT 28 01/21/2015   AST 40 01/21/2015   NA 137 03/27/2015   K 3.1* 03/27/2015   CL 100* 03/27/2015   CREATININE 1.97* 03/27/2015   BUN 43* 03/27/2015   CO2 28 03/27/2015   TSH 2.30 12/11/2014   INR 2.48* 03/27/2015    No results found.     Assessment & Plan:   Problem List Items Addressed This Visit    Atrial fibrillation ([place]) (Chronic)    New dx on PPM interrogation 2014 Cardiology changed plavix to Eliquis because of same Now on warfarin post op - INR per [facility] monitoring      Relevant Medications   warfarin (COUMADIN) 1 MG tablet   INSOMNIA, CHRONIC - Primary    Prefers temazepam to other prior meds, but ineffective since rehab spring 2016 March 2016  changed therapy to diazepam resulted in complications when patient added diazepam to Restoril rather than discontinuation of first BZ Patient reports discontinuation of all benzodiazepine's in past month - Son confirms same  no longer on low-dose hydrocodone for associated neck pain at night   trial generic Ambien since urologic surgery November 2016 cause hallucinations and fall Will cautiously resume low dose Valium - not to use with any other controlled substance - son and pt agree to same      Malignant neoplasm of anterior wall of bladder ([place])    S/p TU resection 06/14/14 and again 04/02/15- OP note reviewed s/p SNF rehab then home early March 2016 Struggling with failure to thrive overall OP follow up with uro to continue BCG as planned      Relevant Medications   diazepam (VALIUM) 2 MG tablet       Gwendolyn Grant, MD

## 2015-04-16 NOTE — Patient Instructions (Signed)
It was good to see you today.  We have reviewed your prior records including labs and tests today  Medications reviewed and updated  low-dose Valium to use for sleep as needed. Do no use Valium with any other controlled substances including no pain pills ans no other sleeping pills Your prescription(s) have been submitted to your pharmacy. Please take as directed and contact our office if you believe you are having problem(s) with the medication(s). No other changes recommended at this time.   keep follow-up as scheduled, please call sooner if problems

## 2015-04-20 ENCOUNTER — Encounter: Payer: Self-pay | Admitting: Cardiology

## 2015-04-20 NOTE — Progress Notes (Signed)
Patient ID: Edward [name], male   DOB: 13-Jun-1929, 81 y.o.   MRN: [id number]   Edward, [name]  Date of visit:  04/20/2015 DOB:  [birth year]    Age:  81 yrs. Medical record number:  [id number]     Account number:  [id number] Primary Care Provider: [facility] ____________________________ CURRENT DIAGNOSES  1. Dyspnea, unspecified  2. Chronic kidney disease, stage 3 (moderate)  3. CAD Native without angina  4. Chronic atrial fibrillation  5. Chronic combined systolic (congestive) and diastolic (congestive) heart failure  6. Essential (primary) hypertension  7. Nonrheumatic aortic (valve) stenosis  8. Long term (current) use of anticoagulants  9. Left bundle-branch block  10. Hyperlipidemia  11. Malignant Neoplasm Of Bladder, Unspecified  12. Old myocardial infarction  68. Occlusion and stenosis of bilateral carotid arteries  14. Peripheral vascular disease, unspecified  15. Chronic obstructive pulmonary disease, unspecified  16. Idiopathic Gout, Unspecified Site  17. Presence of cardiac pacemaker  18. Presence of aortocoronary bypass graft ____________________________ ALLERGIES  Ace Inhibitors, Dry cough  Clarithromycin, Intolerance-unknown  Codeine, Intolerance-unknown ____________________________ MEDICATIONS  1. Spiriva with HandiHaler 18 mcg capsule, w/inhalation device, 1 p.o. daily  2. Dulera 200-5 mcg/actuation HFA aerosol inhaler, 2 puff bid  3. warfarin 1 mg tablet, 1 qd or as directed  4. nitroglycerin 0.4 mg sublingual tablet, PRN  5. Crestor 20 mg tablet, 1 p.o. daily  6. Miralax 17 gram/dose oral powder, qd  7. furosemide 40 mg tablet, BID  8. diazepam 2 mg tablet, hs prn  9. furosemide 80 mg tablet, take twice a day by mouth ____________________________ CHIEF COMPLAINTS  Leg, ankle, scrotum edema ____________________________ HISTORY OF PRESENT ILLNESS Seen for cardiac followup. Comes in early and a visit precipitated by his son with complaints of scrotal  edema as well as some cough. He has been using his furosemide 40 mg twice daily. He did not restart his warfarin after his recent urologic surgery. Again to his son and he has not been taking his warfarin regularly. I again asked him to restart this. He has increasingly febile and has fallen and has ecchymoses present on his forehead. Evidently he fell out of the bed. He currently lives with his son and has to get about with a walker. He is coughing but nonproductive weight. He is cachectic and has not been able to gain any weight. He has developed significant scrotal edema since the surgery. His pathology has shown bladder cancer and he is to followup with the urologist about this. He still struggles with insomnia and am quite concerned about her situation. Continues with edema that is present up to the thighsbut he states he is compliant with his medicine. Difficult to know how true this is as the son states that he lets his father manage his own medicines and he has not been compliant with his anticoagulation. ____________________________ PAST HISTORY  Past Medical Illnesses:  hypertension, hyperlipidemia, peripheral vascular disease, COPD, history of shingles, gout, GERD;  Cardiovascular Illnesses:  diastolic CHF, CAD, S/P MI-inferior, conduction disorder-LBBB, second degree heart block, atrial fibrillation;  Surgical Procedures:  redo CABG w SVG to OM, SVG to RCA8/10/00 Dr. Cyndia Bent, CABG w LIMA to LAD, SVG to dx, OM, RCA 1989 Dr. Arlyce Dice, carotid endarterectomy-bil, cholecystectomy, hemorrhoidectomy, L carotid subclavian bypass, repair of anal fissure, AAA repair, R Shoulder surgery x2;  NYHA Classification:  II;  Canadian Angina Classification:  Class 0: Asymptomatic;  Cardiology Procedures-Invasive:  cardiac cath (left) 2000, cardioversion August 2015, cardioversion April 2016;  Cardiology Procedures-Noninvasive:  adenosine cardiolte March 2008, treadmill cardiolite June 2011, echocardiogram June 2014,  echocardiogram July 2015;  Cardiac Cath Results:  normal Left main, occluded LAD, occluded CFX, occluded RCA, occluded Diag 1 SVG, occluded RCA SVG, 99% stenosis CFX SVG, widely patent LAD LIMA graft;  LVEF of 45% documented via echocardiogram on 06/19/2014,   ____________________________ CARDIO-PULMONARY TEST DATES EKG Date:  04/20/2015;   Cardiac Cath Date:  05/10/2007;  CABG: 01/07/1999;  Nuclear Study Date:  11/26/2009;  Echocardiography Date: 06/19/2014;  Chest Xray Date: 11/18/2013;   ____________________________ FAMILY HISTORY Brother -- Brother dead, Diabetes mellitus Brother -- Brother alive and well Father -- Father dead, Leukemia Mother -- Mother dead, Coronary Artery Disease Sister -- Sister alive and well Sister -- Sister dead Sister -- Sister alive with problem, Cancer Sister -- Sister alive and well Sister -- Sister dead, Cancer Sister -- Coronary Artery Disease ____________________________ SOCIAL HISTORY Alcohol Use:  beer;  Smoking:  used to smoke but quit 1998, greater than 50 pack year history;  Diet:  regular diet without modifications;  Lifestyle:  widower;  Exercise:  exercise is limited due to physical disability;  Occupation:  retired Astronomer;  Residence:  lives with son;   ____________________________ REVIEW OF SYSTEMS General:  malaise and fatigue, weight gain of approximately 10 lbs Eyes: wears eye glasses/contact lenses, cataracts Ears, Nose, Throat, Mouth:  sore throat, hoarseness Respiratory: dyspnea with exertion Cardiovascular:  please review HPI Abdominal: denies dyspepsia, GI bleeding, constipation, or diarrheaNeurological:  unsteady gait Psychiatric:  insomnia  ____________________________ PHYSICAL EXAMINATION VITAL SIGNS  Blood Pressure:  78/60 Sitting, Left arm, regular cuff  , 74/64 Standing, Left arm, regular cuff   and 95/60 Retaken by MD   Pulse:  78/min. Weight:  138.00 lbs. Height:  70"BMI: 20  Constitutional:  pleasant frail, thin white male  in no acute distress, cachectic Skin:  warm and dry to touch, no apparent skin lesions, or masses noted. Head:  normocephalic, normal hair pattern, dried blood over forehead and bridge of nose Neck:  bilateral carotid endarterectomy scars, no JVD, no bruits, no masses, non-tender Chest:  healed median sternotomy scar, healed pacemaker incision in the left pectoral area, fine rales both bases Cardiac:  regular rhythm, normal S1 and S2, no S3 or S4, grade 1/6 systolic murmur at aortic area radiating to neck Peripheral Pulses:  bilateral femoral bruits present, femoral pulses 2+, dorsalis pedis pulses diminished, posterior tibial pulses diminished Genitalia Male:  scrotal edema Extremities & Back:  well healed saphenous vein donor site RLE, well healed saphenous vein donor site LLE, 3+ edema Neurological:  no gross motor or sensory deficits noted, affect appropriate, oriented x3. ____________________________ MOST RECENT LIPID PANEL 11/18/13  CHOL TOTL 119 mg/dl, LDL 57 NM, HDL 47 mg/dl, TRIGLYCER 74 mg/dl and CHOL/HDL 2.5 (Calc) ____________________________ IMPRESSIONS/PLAN 1. Combined systolic and diastolic failure with scrotal edema and peripheral edema  2. Recurrent bladder cancer  3. Coronary artery disease with previous bypass grafting 4. Failure to thrive with poor social situation 5. Chronic kidney disease 6. Recent fall  Recommendations:  I have asked home health care to become involved to assess the home situation. He will increase his furosemide to 80 mg twice a day and had a chest x-ray as well as lab studies. Followup on December 1 to see if he has lost additional weight. Advised fluid restriction.  ____________________________ TODAYS ORDERS  1. 12 Lead EKG: Today  2. BNP: Today  3. Basic Metabolic Panel: Today  4. Return  Visit: keep appt  5. Chest X-ray PA/Lat: today                       ____________________________ Cardiology Physician:  Kerry Hough MD  [facility]

## 2015-04-21 ENCOUNTER — Ambulatory Visit
Admission: RE | Admit: 2015-04-21 | Discharge: 2015-04-21 | Disposition: A | Discharge status: Home / Self Care | Payer: Medicare Other | Source: Ambulatory Visit | Attending: Cardiology | Admitting: Cardiology

## 2015-04-21 ENCOUNTER — Other Ambulatory Visit: Payer: Self-pay | Admitting: Cardiology

## 2015-04-21 DIAGNOSIS — R0603 Acute respiratory distress: Secondary | ICD-10-CM

## 2015-04-22 NOTE — Discharge Summary (Signed)
Patient ID: Edward [name] MRN: [id number] DOB/AGE: August 29, 1929 81 y.o.  Admit date: 04/02/2015 Discharge date: 04/22/2015  Primary Care Physician:  Gwendolyn Grant, MD  Discharge Diagnoses:   Present on Admission:  . Malignant neoplasm of anterior wall of bladder (HCC)  Consults:  None     Discharge Medications:   Medication List    STOP taking these medications        amoxicillin-clavulanate 875-125 MG tablet  Commonly known as:  AUGMENTIN     glycerin adult 2 G Supp  Commonly known as:  glycerin adult     HYDROcodone-acetaminophen 5-325 MG tablet  Commonly known as:  NORCO/VICODIN     silodosin 8 MG Caps capsule  Commonly known as:  RAPAFLO     warfarin 1 MG tablet  Commonly known as:  COUMADIN      TAKE these medications        DULERA 100-5 MCG/ACT Aero  Generic drug:  mometasone-formoterol  Inhale 2 puffs into the lungs 2 (two) times daily.     furosemide 40 MG tablet  Commonly known as:  LASIX  Take 40 mg by mouth 2 (two) times daily.     nitroGLYCERIN 0.4 MG SL tablet  Commonly known as:  NITROSTAT  Place 0.4 mg under the tongue every 5 (five) minutes as needed for chest pain.     polyethylene glycol packet  Commonly known as:  MIRALAX / GLYCOLAX  Take 17 g by mouth daily.     rosuvastatin 20 MG tablet  Commonly known as:  CRESTOR  Take 20 mg by mouth daily.     tiotropium 18 MCG inhalation capsule  Commonly known as:  SPIRIVA  Place 1 capsule (18 mcg total) into inhaler and inhale daily.         Significant Diagnostic Studies:  Dg Chest 2 View  04/21/2015  CLINICAL DATA:  Cough, chest congestion for several days, history of CABG previously, former smoking history EXAM: CHEST  2 VIEW COMPARISON:  Chest x-ray of 06/24/2014 and 06/06/2014, thoracic spine films of 10/07/2014 as well as CT abdomen and pelvis of 06/11/2014 FINDINGS: There is moderate cardiomegaly present. There is evidence of pulmonary vascular congestion and there may be a tiny  amount of pleural fluid present some of which is in the fissure, consistent with mild congestive heart failure. A dual lead permanent pacemaker is noted. Right humeral head prosthesis is unchanged. On the lateral view there is slightly more compression deformity of L1 vertebral body than was present on the sagittal images from the CT of 06/11/2014. Mild compression of T6 is unchanged. IMPRESSION: 1. Moderate cardiomegaly and probable mild CHF with small effusions and pulmonary vascular congestion. 2. Slightly more compression of L1 vertebral body when compared to CT images from 06/11/2014. Electronically Signed   By: Ivar Drape M.D.   On: 04/21/2015 13:28    Brief H and P: For complete details please refer to admission H and P, but in brief the patient was admitted for repeat TURBT.  Hospital Course:  Active Problems:   Malignant neoplasm of anterior wall of bladder (HCC)  The patient experienced no postoperative difficulty. Catheter was removed on postoperative day #1, he voided well and was discharged home.  Day of Discharge BP 100/60 mmHg  Pulse 90  Temp(Src) 97.7 F (36.5 C) (Oral)  Resp 18  Ht 5\' 10"  (1.778 m)  Wt 61.236 kg (135 lb)  BMI 19.37 kg/m2  SpO2 92%  No results found for this or  any previous visit (from the past 24 hour(s)).  Physical Exam: General: Alert and awake oriented x3 not in any acute distress. HEENT: anicteric sclera, pupils reactive to light and accommodation CVS: S1-S2 clear no murmur rubs or gallops Chest: clear to auscultation bilaterally, no wheezing rales or rhonchi Abdomen: soft nontender, nondistended, normal bowel sounds, no organomegaly Extremities: no cyanosis, clubbing or edema noted bilaterally Neuro: Cranial nerves II-XII intact, no focal neurological deficits  Disposition:  Home  Diet:  No restrictions  Activity:  As tolerated   Disposition and Follow-up:     Discharge Instructions    Discharge patient    Complete by:  As directed              He'll be discharged to home and follow-up as scheduled  TESTS THAT NEED FOLLOW-UP  Pathology review  DISCHARGE FOLLOW-UP Follow-up Information    Follow up with Jorja Loa, MD.   Specialty:  Urology   Why:  As scheduled   Contact information:   [place] [address] [phone]       Time spent on Discharge:  10 minutes  Signed: Jorja Loa 04/22/2015, 6:36 AM

## 2015-05-04 ENCOUNTER — Inpatient Hospital Stay (HOSPITAL_COMMUNITY): Payer: Medicare Other

## 2015-05-04 ENCOUNTER — Other Ambulatory Visit: Payer: Self-pay | Admitting: Cardiology

## 2015-05-04 ENCOUNTER — Encounter (HOSPITAL_COMMUNITY): Payer: Self-pay | Admitting: Cardiology

## 2015-05-04 ENCOUNTER — Inpatient Hospital Stay (HOSPITAL_COMMUNITY)
Admission: AD | Admit: 2015-05-04 | Discharge: 2015-05-12 | DRG: 291 | Disposition: A | Discharge status: Skilled Nursing Facility | Payer: Medicare Other | Source: Ambulatory Visit | Attending: Cardiology | Admitting: Cardiology

## 2015-05-04 DIAGNOSIS — I4892 Unspecified atrial flutter: Secondary | ICD-10-CM | POA: Diagnosis present

## 2015-05-04 DIAGNOSIS — Z885 Allergy status to narcotic agent status: Secondary | ICD-10-CM

## 2015-05-04 DIAGNOSIS — Z881 Allergy status to other antibiotic agents status: Secondary | ICD-10-CM | POA: Diagnosis not present

## 2015-05-04 DIAGNOSIS — I509 Heart failure, unspecified: Secondary | ICD-10-CM

## 2015-05-04 DIAGNOSIS — R0602 Shortness of breath: Secondary | ICD-10-CM | POA: Diagnosis present

## 2015-05-04 DIAGNOSIS — R627 Adult failure to thrive: Secondary | ICD-10-CM | POA: Diagnosis present

## 2015-05-04 DIAGNOSIS — I428 Other cardiomyopathies: Secondary | ICD-10-CM | POA: Diagnosis present

## 2015-05-04 DIAGNOSIS — I959 Hypotension, unspecified: Secondary | ICD-10-CM | POA: Diagnosis present

## 2015-05-04 DIAGNOSIS — E43 Unspecified severe protein-calorie malnutrition: Secondary | ICD-10-CM | POA: Insufficient documentation

## 2015-05-04 DIAGNOSIS — N5089 Other specified disorders of the male genital organs: Secondary | ICD-10-CM | POA: Diagnosis present

## 2015-05-04 DIAGNOSIS — Z7901 Long term (current) use of anticoagulants: Secondary | ICD-10-CM | POA: Diagnosis not present

## 2015-05-04 DIAGNOSIS — Z8249 Family history of ischemic heart disease and other diseases of the circulatory system: Secondary | ICD-10-CM | POA: Diagnosis not present

## 2015-05-04 DIAGNOSIS — Z951 Presence of aortocoronary bypass graft: Secondary | ICD-10-CM

## 2015-05-04 DIAGNOSIS — R0600 Dyspnea, unspecified: Secondary | ICD-10-CM

## 2015-05-04 DIAGNOSIS — Z950 Presence of cardiac pacemaker: Secondary | ICD-10-CM

## 2015-05-04 DIAGNOSIS — I252 Old myocardial infarction: Secondary | ICD-10-CM | POA: Diagnosis not present

## 2015-05-04 DIAGNOSIS — I739 Peripheral vascular disease, unspecified: Secondary | ICD-10-CM | POA: Diagnosis present

## 2015-05-04 DIAGNOSIS — Z961 Presence of intraocular lens: Secondary | ICD-10-CM | POA: Diagnosis present

## 2015-05-04 DIAGNOSIS — I5023 Acute on chronic systolic (congestive) heart failure: Secondary | ICD-10-CM | POA: Diagnosis not present

## 2015-05-04 DIAGNOSIS — I5043 Acute on chronic combined systolic (congestive) and diastolic (congestive) heart failure: Secondary | ICD-10-CM | POA: Diagnosis present

## 2015-05-04 DIAGNOSIS — Q600 Renal agenesis, unilateral: Secondary | ICD-10-CM | POA: Diagnosis not present

## 2015-05-04 DIAGNOSIS — R64 Cachexia: Secondary | ICD-10-CM | POA: Diagnosis present

## 2015-05-04 DIAGNOSIS — Z7951 Long term (current) use of inhaled steroids: Secondary | ICD-10-CM

## 2015-05-04 DIAGNOSIS — N179 Acute kidney failure, unspecified: Secondary | ICD-10-CM | POA: Diagnosis present

## 2015-05-04 DIAGNOSIS — G4700 Insomnia, unspecified: Secondary | ICD-10-CM | POA: Diagnosis not present

## 2015-05-04 DIAGNOSIS — J449 Chronic obstructive pulmonary disease, unspecified: Secondary | ICD-10-CM | POA: Diagnosis present

## 2015-05-04 DIAGNOSIS — N184 Chronic kidney disease, stage 4 (severe): Secondary | ICD-10-CM | POA: Diagnosis present

## 2015-05-04 DIAGNOSIS — I130 Hypertensive heart and chronic kidney disease with heart failure and stage 1 through stage 4 chronic kidney disease, or unspecified chronic kidney disease: Principal | ICD-10-CM | POA: Diagnosis present

## 2015-05-04 DIAGNOSIS — R634 Abnormal weight loss: Secondary | ICD-10-CM

## 2015-05-04 DIAGNOSIS — I2510 Atherosclerotic heart disease of native coronary artery without angina pectoris: Secondary | ICD-10-CM | POA: Diagnosis present

## 2015-05-04 DIAGNOSIS — E785 Hyperlipidemia, unspecified: Secondary | ICD-10-CM | POA: Diagnosis present

## 2015-05-04 DIAGNOSIS — I5042 Chronic combined systolic (congestive) and diastolic (congestive) heart failure: Secondary | ICD-10-CM | POA: Diagnosis present

## 2015-05-04 DIAGNOSIS — Z515 Encounter for palliative care: Secondary | ICD-10-CM | POA: Diagnosis not present

## 2015-05-04 DIAGNOSIS — C679 Malignant neoplasm of bladder, unspecified: Secondary | ICD-10-CM | POA: Diagnosis present

## 2015-05-04 DIAGNOSIS — Z681 Body mass index (BMI) 19 or less, adult: Secondary | ICD-10-CM | POA: Diagnosis not present

## 2015-05-04 DIAGNOSIS — Z79899 Other long term (current) drug therapy: Secondary | ICD-10-CM | POA: Diagnosis not present

## 2015-05-04 DIAGNOSIS — I493 Ventricular premature depolarization: Secondary | ICD-10-CM | POA: Diagnosis present

## 2015-05-04 DIAGNOSIS — I459 Conduction disorder, unspecified: Secondary | ICD-10-CM | POA: Diagnosis present

## 2015-05-04 DIAGNOSIS — Z888 Allergy status to other drugs, medicaments and biological substances status: Secondary | ICD-10-CM | POA: Diagnosis not present

## 2015-05-04 DIAGNOSIS — I482 Chronic atrial fibrillation: Secondary | ICD-10-CM | POA: Diagnosis present

## 2015-05-04 DIAGNOSIS — N4889 Other specified disorders of penis: Secondary | ICD-10-CM | POA: Diagnosis present

## 2015-05-04 DIAGNOSIS — Z955 Presence of coronary angioplasty implant and graft: Secondary | ICD-10-CM | POA: Diagnosis not present

## 2015-05-04 DIAGNOSIS — D649 Anemia, unspecified: Secondary | ICD-10-CM | POA: Diagnosis present

## 2015-05-04 DIAGNOSIS — Z87891 Personal history of nicotine dependence: Secondary | ICD-10-CM | POA: Diagnosis not present

## 2015-05-04 DIAGNOSIS — I495 Sick sinus syndrome: Secondary | ICD-10-CM | POA: Diagnosis not present

## 2015-05-04 HISTORY — DX: Chronic combined systolic (congestive) and diastolic (congestive) heart failure: I50.42

## 2015-05-04 LAB — CBC WITH DIFFERENTIAL/PLATELET
BASOS ABS: 0 10*3/uL (ref 0.0–0.1)
Basophils Relative: 1 %
EOS ABS: 0.1 10*3/uL (ref 0.0–0.7)
EOS PCT: 2 %
HCT: 32.3 % — ABNORMAL LOW (ref 39.0–52.0)
Hemoglobin: 10.3 g/dL — ABNORMAL LOW (ref 13.0–17.0)
Lymphocytes Relative: 13 %
Lymphs Abs: 0.7 10*3/uL (ref 0.7–4.0)
MCH: 27.1 pg (ref 26.0–34.0)
MCHC: 31.9 g/dL (ref 30.0–36.0)
MCV: 85 fL (ref 78.0–100.0)
Monocytes Absolute: 0.8 10*3/uL (ref 0.1–1.0)
Monocytes Relative: 14 %
Neutro Abs: 3.9 10*3/uL (ref 1.7–7.7)
Neutrophils Relative %: 70 %
PLATELETS: 161 10*3/uL (ref 150–400)
RBC: 3.8 MIL/uL — AB (ref 4.22–5.81)
RDW: 17.4 % — ABNORMAL HIGH (ref 11.5–15.5)
WBC: 5.5 10*3/uL (ref 4.0–10.5)

## 2015-05-04 LAB — COMPREHENSIVE METABOLIC PANEL
ALT: 20 U/L (ref 17–63)
AST: 34 U/L (ref 15–41)
Albumin: 2.4 g/dL — ABNORMAL LOW (ref 3.5–5.0)
Alkaline Phosphatase: 145 U/L — ABNORMAL HIGH (ref 38–126)
Anion gap: 9 (ref 5–15)
BUN: 42 mg/dL — ABNORMAL HIGH (ref 6–20)
CHLORIDE: 95 mmol/L — AB (ref 101–111)
CO2: 29 mmol/L (ref 22–32)
CREATININE: 2.04 mg/dL — AB (ref 0.61–1.24)
Calcium: 8.5 mg/dL — ABNORMAL LOW (ref 8.9–10.3)
GFR calc non Af Amer: 28 mL/min — ABNORMAL LOW (ref >60)
GFR, EST AFRICAN AMERICAN: 33 mL/min — AB (ref >60)
Glucose, Bld: 138 mg/dL — ABNORMAL HIGH (ref 65–99)
Potassium: 3.5 mmol/L (ref 3.5–5.1)
SODIUM: 133 mmol/L — AB (ref 135–145)
Total Bilirubin: 1.7 mg/dL — ABNORMAL HIGH (ref 0.3–1.2)
Total Protein: 6.9 g/dL (ref 6.5–8.1)

## 2015-05-04 LAB — PROTIME-INR
INR: 2.31 — AB (ref 0.00–1.49)
Prothrombin Time: 25.2 seconds — ABNORMAL HIGH (ref 11.6–15.2)

## 2015-05-04 LAB — TSH: TSH: 4.462 u[IU]/mL (ref 0.350–4.500)

## 2015-05-04 LAB — T4, FREE: FREE T4: 1.41 ng/dL — AB (ref 0.61–1.12)

## 2015-05-04 LAB — BRAIN NATRIURETIC PEPTIDE: B NATRIURETIC PEPTIDE 5: 2095 pg/mL — AB (ref 0.0–100.0)

## 2015-05-04 MED ORDER — FUROSEMIDE 10 MG/ML IJ SOLN
80.0000 mg | Freq: Two times a day (BID) | INTRAMUSCULAR | Status: DC
  Administered 2015-05-04: 80 mg via INTRAVENOUS
  Filled 2015-05-04: qty 8

## 2015-05-04 MED ORDER — DIAZEPAM 2 MG PO TABS
2.0000 mg | ORAL_TABLET | Freq: Every evening | ORAL | Status: DC | PRN
  Administered 2015-05-04 – 2015-05-05 (×2): 2 mg via ORAL
  Filled 2015-05-04 (×2): qty 1

## 2015-05-04 MED ORDER — ACETAMINOPHEN 325 MG PO TABS
650.0000 mg | ORAL_TABLET | ORAL | Status: DC | PRN
  Administered 2015-05-09 – 2015-05-10 (×2): 650 mg via ORAL
  Filled 2015-05-04 (×2): qty 2

## 2015-05-04 MED ORDER — WARFARIN - PHARMACIST DOSING INPATIENT
Freq: Every day | Status: DC
  Administered 2015-05-05 – 2015-05-11 (×4)

## 2015-05-04 MED ORDER — WARFARIN SODIUM 2 MG PO TABS
1.0000 mg | ORAL_TABLET | Freq: Every day | ORAL | Status: DC
  Administered 2015-05-04 – 2015-05-05 (×2): 1 mg via ORAL
  Filled 2015-05-04 (×2): qty 0.5

## 2015-05-04 MED ORDER — ONDANSETRON HCL 4 MG/2ML IJ SOLN: 4.0000 mg | Freq: Four times a day (QID) | INTRAMUSCULAR | Status: DC | PRN

## 2015-05-04 MED ORDER — POLYETHYLENE GLYCOL 3350 17 G PO PACK
17.0000 g | PACK | Freq: Every day | ORAL | Status: DC
  Administered 2015-05-06 – 2015-05-12 (×7): 17 g via ORAL
  Filled 2015-05-04 (×8): qty 1

## 2015-05-04 MED ORDER — SODIUM CHLORIDE 0.9 % IV SOLN: 250.0000 mL | INTRAVENOUS | Status: DC | PRN

## 2015-05-04 MED ORDER — FUROSEMIDE 10 MG/ML IJ SOLN
80.0000 mg | Freq: Three times a day (TID) | INTRAMUSCULAR | Status: DC
  Administered 2015-05-04 – 2015-05-07 (×7): 80 mg via INTRAVENOUS
  Filled 2015-05-04 (×9): qty 8

## 2015-05-04 MED ORDER — ENSURE ENLIVE PO LIQD
237.0000 mL | Freq: Two times a day (BID) | ORAL | Status: DC
  Administered 2015-05-05: 237 mL via ORAL

## 2015-05-04 MED ORDER — SODIUM CHLORIDE 0.9 % IJ SOLN: 3.0000 mL | INTRAMUSCULAR | Status: DC | PRN

## 2015-05-04 MED ORDER — MOMETASONE FURO-FORMOTEROL FUM 100-5 MCG/ACT IN AERO
2.0000 | INHALATION_SPRAY | Freq: Two times a day (BID) | RESPIRATORY_TRACT | Status: DC
  Administered 2015-05-04 – 2015-05-12 (×13): 2 via RESPIRATORY_TRACT
  Filled 2015-05-04 (×2): qty 8.8

## 2015-05-04 MED ORDER — SODIUM CHLORIDE 0.9 % IJ SOLN
3.0000 mL | Freq: Two times a day (BID) | INTRAMUSCULAR | Status: DC
  Administered 2015-05-04 – 2015-05-12 (×17): 3 mL via INTRAVENOUS

## 2015-05-04 MED ORDER — NITROGLYCERIN 0.4 MG SL SUBL: 0.4000 mg | SUBLINGUAL_TABLET | SUBLINGUAL | Status: DC | PRN

## 2015-05-04 MED ORDER — TIOTROPIUM BROMIDE MONOHYDRATE 18 MCG IN CAPS
18.0000 ug | ORAL_CAPSULE | Freq: Every day | RESPIRATORY_TRACT | Status: DC
  Administered 2015-05-05 – 2015-05-12 (×7): 18 ug via RESPIRATORY_TRACT
  Filled 2015-05-04 (×3): qty 5

## 2015-05-04 NOTE — H&P (Signed)
History and Physical   Admit date: 05/04/2015 Name:  Edward [name] Medical record number: [id number] DOB/Age:  [birth year]  81 y.o. male  Primary Cardiologist:  Wynonia Lawman  Primary Physician:   Basilio Cairo  Chief complaint/reason for admission: Shortness of breath and failure to thrive  HPI:  This 81 year old male has a history of chronic combined systolic and diastolic heart failure.  He has known coronary artery disease with previous bypass grafting and has chronic atrial flutter as well as a permanent pacemaker.  He has recently been diagnosed with bladder cancer and underwent TURBT the first part of November.  He has had failure to thrive for most of the year and also has underlying pulmonary issues.  He has lost a considerable amount of weight this year and is considerably malnourished.  After his most recent bladder surgery he began to have significant scrotal edema and other edema of his legs.  He was seen 2 weeks ago and his furosemide was increased but despite this he has gained 4 pounds and is quite weak.  He has severe issues with deconditioning as well as severe chronic dyspnea and abnormal weight loss.  Most recent surgical results showed recurrent cancer of the bladder which was invasive.  He is admitted at this time for further treatment and diuresis and to determine issues to failure to thrive abnormal weight loss and to try to help diurese him.   Past Medical History  Diagnosis Date  . Gout     "only once in my lifetime" (12/18/2012)  . Depression   . PVD (peripheral vascular disease) ([place])     s/p B CEA  . COPD (chronic obstructive pulmonary disease) ([place])      PFT 6.19.07: FEV1 465  ratio 60 with 25% response to B2.  > PFTs 8.29.07: FEV1 61%  ratio 46% no better after B2.  > add on advair 12.20.11-improved 1.31.12  . AAA (abdominal aortic aneurysm) ([place])   . Melanosis coli   . Internal hemorrhoids   . Carotid artery occlusion   . Aortic valve disorder   . Chronic combined  systolic and diastolic heart failure ([place])   . CAD (coronary artery disease), native coronary artery     CABG w LIMA to LAD, SVG to dx, OM, RCA 1989 Dr. Arlyce Dice for 3VD PTCA of OM, 1999 and 2000 Cath showed occlusion of left main and RCA with stenosis in OM Redo redo CABG w SVG to OM, SVG to RCA8/10/00 Dr. Cyndia Bent   . Hyperlipidemia   . BPH (benign prostatic hypertrophy)   . Hypertensive heart disease     Change toprol to bisoprolol 10 mg daily on trial basis  05/21/2012  - notified by [facility] non adherent 07/17/2012    . LBBB (left bundle branch block)   . Second degree heart block 12/18/2012    s/p MDT Adapta L pacemaker 12-20-2012 by Dr Caryl Comes  . Shingles     mild  . Chronic kidney disease stage III (GFR 30-59 ml/min)     saw dr Justin Mend oct 2015 and released by dr webb  . Congenital absence of kidney     "noted during AAA repair; never knew it before" (12/18/2012)  . Myocardial infarction ([place]) 1979    times 2  . Presence of permanent cardiac pacemaker   . Cancer of bladder wall ([place]) 06/16/2014     Past Surgical History  Procedure Laterality Date  . Cholecystectomy  1998  . Hemiarthroplasty shoulder fracture Right ~ 2008  x 2 - [place]  . Carotid endarterectomy Bilateral 1990's  . Abdominal aortic aneurysm repair  1998  . Carotid-subclavian bypass graft Left 1990's  . Anal fissure repair      12/18/2012 "I don't remember this"  . Hemorroidectomy      12/18/2012 "I don't remember this"  . Carpal tunnel release Right 1980's    "Dr. Marrian Salvage" (12/18/2012)  . Cardiac catheterization      "2 or 3" (12/18/2012)  . Coronary angioplasty with stent placement      "I've got 1 or 2" (12/18/2012)  . Hip fracture surgery  1944    "fell out of a tree; had it operated on 3 times" (12/18/2012)  . Refractive surgery Bilateral 1990's?  . Pacemaker insertion  12-20-2012    dual chamber Medtronic Adapta L pacemaker implanted by Dr Caryl Comes  . Cardioversion N/A 12/30/2013    Procedure: CARDIOVERSION;   Surgeon: Jacolyn Reedy, MD;  Location: [place];  Service: Cardiovascular;  Laterality: N/A;  . Permanent pacemaker insertion N/A 12/20/2012    Procedure: PERMANENT PACEMAKER INSERTION;  Surgeon: Deboraha Sprang, MD;  Location: [facility] CATH LAB;  Service: Cardiovascular;  Laterality: N/A;  . Coronary artery bypass graft  1989, 2000     "CABG X ?3; CABG X 5 w//B[place]le" (12/18/2012)  . Transurethral resection of bladder tumor N/A 06/16/2014    Procedure: TRANSURETHRAL RESECTION OF BLADDER TUMOR (TURBT) ;  Surgeon: Jorja Loa, MD;  Location: [facility];  Service: Urology;  Laterality: N/A;  . Cardioversion N/A 09/26/2014    Procedure: CARDIOVERSION;  Surgeon: Jacolyn Reedy, MD;  Location: [place];  Service: Cardiovascular;  Laterality: N/A;  . Eye surgery      bil catarcts with lens implants on right eye  . Transurethral resection of bladder tumor N/A 04/02/2015    Procedure: TRANSURETHRAL RESECTION OF BLADDER TUMOR (TURBT);  Surgeon: Franchot Gallo, MD;  Location: [facility];  Service: Urology;  Laterality: N/A;  CYSTOSCOPY      Allergies: is allergic to ace inhibitors; clarithromycin; codeine; oxycodone-acetaminophen; and viberzi.   Medications: Prior to Admission medications   Medication Sig Start Date End Date Taking? Authorizing Provider  diazepam (VALIUM) 2 MG tablet Take 1 tablet (2 mg total) by mouth at bedtime as needed for sedation. 04/16/15  Yes Rowe Clack, MD  DULERA 100-5 MCG/ACT AERO Inhale 2 puffs into the lungs 2 (two) times daily. 11/26/14  Yes Tanda Rockers, MD  furosemide (LASIX) 80 MG tablet Take 80 mg by mouth 2 (two) times daily.   Yes Historical Provider, MD  rosuvastatin (CRESTOR) 20 MG tablet Take 20 mg by mouth daily.     Yes Historical Provider, MD  tiotropium (SPIRIVA) 18 MCG inhalation capsule Place 1 capsule (18 mcg total) into inhaler and inhale daily. 03/04/15 03/03/16 Yes Tanda Rockers, MD  warfarin (COUMADIN) 1 MG tablet Take 1 tablet (1 mg  total) by mouth daily. Alternates taking 0.5mg  and 1mg  each day. Patient taking differently: Take 1 mg by mouth daily.  04/16/15  Yes Rowe Clack, MD  furosemide (LASIX) 40 MG tablet Take 40 mg by mouth 2 (two) times daily.     Historical Provider, MD  nitroGLYCERIN (NITROSTAT) 0.4 MG SL tablet Place 0.4 mg under the tongue every 5 (five) minutes as needed for chest pain.    Historical Provider, MD  polyethylene glycol (MIRALAX / GLYCOLAX) packet Take 17 g by mouth daily. Patient not taking: Reported on 05/04/2015 01/21/15  Charlesetta Shanks, MD   Family History:  Family Status  Relation Status Death Age  . Father Deceased     died of leukemia  . Mother Deceased     died of CAD  . Brother Deceased   . Brother Alive   . Sister Deceased     Died of trauma  . Sister Deceased   . Sister Deceased     Social History:   reports that he quit smoking about 18 years ago. His smoking use included Cigarettes. He has a 75 pack-year smoking history. He has never used smokeless tobacco. He reports that he drinks about 1.8 oz of alcohol per week. He reports that he does not use illicit drugs.   Social History   Social History Narrative   Lives alone. Widower.   his son stays with him some but the social situation at home has been somewhat tenuous.   Review of Systems: Has lost considerable weight this year as much as 40 pounds.  Poor appetite, significant scrotal edema, significant issues with urinary retention as well as nocturia.  He has significant dyspnea on exertion and also was quite weak.  He has fallen on several occasions.  He has severe insomnia as well as constipation. Other than as noted above, the remainder of the review of systems is normal  Physical Exam: BP 104/68 mmHg  Pulse 88  Temp(Src) 97.7 F (36.5 C) (Oral)  Resp 16  Wt 64.184 kg (141 lb 8 oz)  SpO2 95% General appearance: He is a frail, cachectic appearing white male in no acute distress Head: Normocephalic,  without obvious abnormality, atraumatic, Balding male hair pattern Neck: no adenopathy, no carotid bruit, no JVD, supple, symmetrical, trachea midline and Bilateral carotid endarterectomy scars noted Lungs: Rales present at the right base Heart: 2/6 systolic murmur at aortic area with some radiation to the neck, no S3, irregular rhythm Abdomen: Cachectic soft and nontender, abdominal bruit noted Male genitalia: normal, Significant scrotal edema is present Rectal: deferred Extremities: There is 2-3+ edema predominantly present in the upper thighs but still also present in the lower thighs and less so in the ankle, peripheral pulses are diminished. Pulses: 2+ and symmetric Oral pulses 2+ with bruits as well as reduced pedal pulses Neurologic: Grossly normal  Labs: CBC  Recent Labs  05/04/15 1205  WBC 5.5  RBC 3.80*  HGB 10.3*  HCT 32.3*  PLT 161  MCV 85.0  MCH 27.1  MCHC 31.9  RDW 17.4*  LYMPHSABS 0.7  MONOABS 0.8  EOSABS 0.1  BASOSABS 0.0   Thyroid  Lab Results  Component Value Date   TSH 2.30 12/11/2014    EKG: Paced   Radiology: Chest x-ray pending at the time of note preparation   IMPRESSIONS: 1.  Acute on chronic systolic and diastolic congestive heart failure with failure to diurese on outpatient regimen 2.  Anasarca with scrotal edema that may or may not be due to heart failure 3.  Coronary artery disease with previous bypass grafting 4.  Bladder cancer 5.  Abnormal weight loss 6.  Failure to thrive with poor social situation 7.  Severe COPD 8.  Stage III chronic kidney disease 9.  Anemia of uncertain etiology 10.  Long-term use of anticoagulation  PLAN: He has had failure to diurese despite outpatient  escalation of diuretics.  I will obtain a repeat echocardiogram.  He likely also will need a CT scan without contrast to be sure he does not have metastatic disease elsewhere.  Try intravenous diuresis.  Needs social work consults and assessment of home  situation.  Signed: Kerry Hough MD [facility] Cardiology  05/04/2015, 1:13 PM

## 2015-05-04 NOTE — Progress Notes (Signed)
Pt direct admit with CHF, pt a/o, no c/o pain, pt given 80 mg IV Lasix, VSS, pt stable, bed alarm on safety maintained

## 2015-05-04 NOTE — Progress Notes (Signed)
ANTICOAGULATION CONSULT NOTE - Initial Consult  Pharmacy Consult for warfarin Indication: atrial fibrillation  Allergies  Allergen Reactions  . Ace Inhibitors Cough  . Clarithromycin Other (See Comments)    Strange thoughts and fell with biaxin  . Codeine Nausea And Vomiting  . Oxycodone-Acetaminophen Other (See Comments)    Felt closed in  . Viberzi [Eluxadoline] Other (See Comments)    Severe cramps and impaction    Patient Measurements: Weight: 141 lb 8 oz (64.184 kg)  Vital Signs: Temp: 97.7 F (36.5 C) (12/05 1134) Temp Source: Oral (12/05 1134) BP: 104/68 mmHg (12/05 1134) Pulse Rate: 88 (12/05 1134)  Labs:  Recent Labs  05/04/15 1205  HGB 10.3*  HCT 32.3*  PLT 161  LABPROT 25.2*  INR 2.31*  CREATININE 2.04*    Estimated Creatinine Clearance: 24.9 mL/min (by C-G formula based on Cr of 2.04).   Medical History: Past Medical History  Diagnosis Date  . Gout     "only once in my lifetime" (12/18/2012)  . Depression   . PVD (peripheral vascular disease) ([place])     s/p B CEA  . COPD (chronic obstructive pulmonary disease) ([place])      PFT 6.19.07: FEV1 465  ratio 60 with 25% response to B2.  > PFTs 8.29.07: FEV1 61%  ratio 46% no better after B2.  > add on advair 12.20.11-improved 1.31.12  . AAA (abdominal aortic aneurysm) ([place])   . Melanosis coli   . Internal hemorrhoids   . Carotid artery occlusion   . Aortic valve disorder   . Chronic combined systolic and diastolic heart failure ([place])   . CAD (coronary artery disease), native coronary artery     CABG w LIMA to LAD, SVG to dx, OM, RCA 1989 Dr. Arlyce Dice for 3VD PTCA of OM, 1999 and 2000 Cath showed occlusion of left main and RCA with stenosis in OM Redo redo CABG w SVG to OM, SVG to RCA8/10/00 Dr. Cyndia Bent   . Hyperlipidemia   . BPH (benign prostatic hypertrophy)   . Hypertensive heart disease     Change toprol to bisoprolol 10 mg daily on trial basis  05/21/2012  - notified by [facility] non adherent 07/17/2012     . LBBB (left bundle branch block)   . Second degree heart block 12/18/2012    s/p MDT Adapta L pacemaker 12-20-2012 by Dr Caryl Comes  . Shingles     mild  . Chronic kidney disease stage III (GFR 30-59 ml/min)     saw dr Justin Mend oct 2015 and released by dr webb  . Congenital absence of kidney     "noted during AAA repair; never knew it before" (12/18/2012)  . Myocardial infarction ([place]) 1979    times 2  . Presence of permanent cardiac pacemaker   . Cancer of bladder wall ([place]) 06/16/2014    Medications:  Prescriptions prior to admission  Medication Sig Dispense Refill Last Dose  . diazepam (VALIUM) 2 MG tablet Take 1 tablet (2 mg total) by mouth at bedtime as needed for sedation. 30 tablet 0 Past Week at Unknown time  . DULERA 100-5 MCG/ACT AERO Inhale 2 puffs into the lungs 2 (two) times daily. 3 Inhaler 3 05/04/2015 at Unknown time  . furosemide (LASIX) 80 MG tablet Take 80 mg by mouth 2 (two) times daily.   05/04/2015 at Unknown time  . rosuvastatin (CRESTOR) 20 MG tablet Take 20 mg by mouth daily.     05/04/2015 at Unknown time  . tiotropium (SPIRIVA) 18  MCG inhalation capsule Place 1 capsule (18 mcg total) into inhaler and inhale daily. 90 capsule 3 05/04/2015 at Unknown time  . warfarin (COUMADIN) 1 MG tablet Take 1 tablet (1 mg total) by mouth daily. Alternates taking 0.5mg  and 1mg  each day. (Patient taking differently: Take 1 mg by mouth daily. ) 30 tablet 0 05/03/2015 at 1700  . furosemide (LASIX) 40 MG tablet Take 40 mg by mouth 2 (two) times daily.    Taking  . nitroGLYCERIN (NITROSTAT) 0.4 MG SL tablet Place 0.4 mg under the tongue every 5 (five) minutes as needed for chest pain.   unknown at unknown  . polyethylene glycol (MIRALAX / GLYCOLAX) packet Take 17 g by mouth daily. (Patient not taking: Reported on 05/04/2015) 14 each 0 Taking    Assessment: 81 y/o male with CHF, bladder cancer, and FTT admitted for inpatient diuresis. He takes chronic warfarin for Afib. INR is therapeutic at 2.31.  No bleeding noted, Hb low stable, platelets are normal.  PTA: 1 mg daily, last dose 12/4  Goal of Therapy:  INR 2-3 Monitor platelets by anticoagulation protocol: Yes   Plan:  - Warfarin 1 mg PO q1800 - INR daily - Monitor for s/sx of bleeding  [facility], Pharm.D., BCPS Clinical Pharmacist Pager: [phone] 05/04/2015 1:27 PM

## 2015-05-04 NOTE — Progress Notes (Signed)
*  PRELIMINARY RESULTS* Echocardiogram 2D Echocardiogram has been performed.  Leavy Cella 05/04/2015, 3:47 PM

## 2015-05-04 NOTE — Progress Notes (Signed)
Pt c/o noise outside the room and said that he is very sensitive of it and unable to sleep. Writer or this note explained the pt that quite time is being observed. RN spoke with CN that pt be transferred to other room that is far from nurses station. Pt asked if he can have valium tonight. RN was reminded by pt sons earlier that they don't want their Dad to have medication for sleep. Pt is alert and oriented enough to decide for himself and a PRN valium is ordered. We will give valium per order but explained to pt that he will be on bed alarm since he is a high risk of falling. Pt agreed to the plan and said that he will cooperate. We will continue to monitor.

## 2015-05-04 NOTE — Discharge Instructions (Signed)

## 2015-05-05 DIAGNOSIS — I482 Chronic atrial fibrillation: Secondary | ICD-10-CM

## 2015-05-05 DIAGNOSIS — E43 Unspecified severe protein-calorie malnutrition: Secondary | ICD-10-CM | POA: Insufficient documentation

## 2015-05-05 DIAGNOSIS — I5023 Acute on chronic systolic (congestive) heart failure: Secondary | ICD-10-CM

## 2015-05-05 DIAGNOSIS — I495 Sick sinus syndrome: Secondary | ICD-10-CM

## 2015-05-05 LAB — BASIC METABOLIC PANEL
ANION GAP: 9 (ref 5–15)
BUN: 41 mg/dL — AB (ref 6–20)
CHLORIDE: 98 mmol/L — AB (ref 101–111)
CO2: 31 mmol/L (ref 22–32)
Calcium: 8.5 mg/dL — ABNORMAL LOW (ref 8.9–10.3)
Creatinine, Ser: 1.93 mg/dL — ABNORMAL HIGH (ref 0.61–1.24)
GFR, EST AFRICAN AMERICAN: 35 mL/min — AB (ref >60)
GFR, EST NON AFRICAN AMERICAN: 30 mL/min — AB (ref >60)
Glucose, Bld: 134 mg/dL — ABNORMAL HIGH (ref 65–99)
POTASSIUM: 3.3 mmol/L — AB (ref 3.5–5.1)
SODIUM: 138 mmol/L (ref 135–145)

## 2015-05-05 LAB — PROTIME-INR
INR: 2.38 — AB (ref 0.00–1.49)
PROTHROMBIN TIME: 25.8 s — AB (ref 11.6–15.2)

## 2015-05-05 MED ORDER — BOOST PLUS PO LIQD
237.0000 mL | ORAL | Status: DC
  Administered 2015-05-05 – 2015-05-12 (×4): 237 mL via ORAL
  Filled 2015-05-05 (×9): qty 237

## 2015-05-05 MED ORDER — ENSURE ENLIVE PO LIQD
237.0000 mL | ORAL | Status: DC
  Administered 2015-05-06 – 2015-05-12 (×6): 237 mL via ORAL

## 2015-05-05 MED ORDER — POTASSIUM CHLORIDE 20 MEQ/15ML (10%) PO SOLN
40.0000 meq | Freq: Two times a day (BID) | ORAL | Status: AC
  Administered 2015-05-05 (×2): 40 meq via ORAL
  Filled 2015-05-05 (×2): qty 30

## 2015-05-05 NOTE — Progress Notes (Signed)
ANTICOAGULATION CONSULT NOTE - Follow Up Consult  Pharmacy Consult for warfarin Indication: atrial fibrillation  Allergies  Allergen Reactions  . Ace Inhibitors Cough  . Clarithromycin Other (See Comments)    Strange thoughts and fell with biaxin  . Codeine Nausea And Vomiting  . Oxycodone-Acetaminophen Other (See Comments)    Felt closed in  . Viberzi [Eluxadoline] Other (See Comments)    Severe cramps and impaction    Patient Measurements: Height: 5\' 10"  (177.8 cm) Weight: 137 lb 6.4 oz (62.324 kg) (bed; pt refuses to stand) IBW/kg (Calculated) : 73  Vital Signs: Temp: 97.8 F (36.6 C) (12/06 0544) Temp Source: Oral (12/06 0544) BP: 99/62 mmHg (12/06 0544) Pulse Rate: 97 (12/06 0544)  Labs:  Recent Labs  05/04/15 1205 05/05/15 0533  HGB 10.3*  --   HCT 32.3*  --   PLT 161  --   LABPROT 25.2* 25.8*  INR 2.31* 2.38*  CREATININE 2.04* 1.93*    Estimated Creatinine Clearance: 25.6 mL/min (by C-G formula based on Cr of 1.93).  Assessment: 81 y/o male with CHF, bladder cancer, and FTT admitted for inpatient diuresis. He takes chronic warfarin for Afib. INR is therapeutic at 2.38. No bleeding noted.  PTA: 1 mg daily, last dose 12/4  Goal of Therapy:  INR 2-3 Monitor platelets by anticoagulation protocol: Yes   Plan:  - Warfarin 1 mg PO q1800 - INR daily - Monitor for s/sx of bleeding  [facility], Pharm.D., BCPS Clinical Pharmacist Pager: [phone] 05/05/2015 10:53 AM

## 2015-05-05 NOTE — Progress Notes (Signed)
Utilization review completed. Leoncio Hansen, RN, BSN. 

## 2015-05-05 NOTE — Progress Notes (Signed)
Subjective:  Admitted from office yesterday with anasarca and worsening shortness of breath and failure to thrive.  Echo yesterday shows further reduction in ejection fraction to around 15%.  He also has marked dyssynchrony.  He has a previous left bundle and also has a RV pacemaker.  Has been unable to maintain sinus rhythm previously.  He has diuresed some overnight and says that he feels somewhat better.  Weight down 4 pounds overnight.  Objective:  Vital Signs in the last 24 hours: BP 99/62 mmHg  Pulse 97  Temp(Src) 97.8 F (36.6 C) (Oral)  Resp 16  Ht 5\' 10"  (1.778 m)  Wt 62.324 kg (137 lb 6.4 oz)  BMI 19.71 kg/m2  SpO2 97%  Physical Exam: Cachectic male sitting up at site of bed in no acute distress Lungs: Reduced breath sounds at the base Cardiac: Irregular rhythm rhythm, normal S1 and S2, S3 heard, 2/6 systolic murmur over aortic valve  Abdomen:  Soft, nontender, no masses Extremities:  3+ edema edema present  Intake/Output from previous day: 12/05 0701 - 12/06 0700 In: 507 [P.O.:507] Out: 1775 [Urine:1775] Weight Filed Weights   05/04/15 1131 05/04/15 1134 05/05/15 0544  Weight: 64.184 kg (141 lb 8 oz) 64.184 kg (141 lb 8 oz) 62.324 kg (137 lb 6.4 oz)    Lab Results: Basic Metabolic Panel:  Recent Labs  05/04/15 1205 05/05/15 0533  NA 133* 138  K 3.5 3.3*  CL 95* 98*  CO2 29 31  GLUCOSE 138* 134*  BUN 42* 41*  CREATININE 2.04* 1.93*    CBC:  Recent Labs  05/04/15 1205  WBC 5.5  NEUTROABS 3.9  HGB 10.3*  HCT 32.3*  MCV 85.0  PLT 161    BNP    Component Value Date/Time   BNP 2095.0* 05/04/2015 1205    PROTIME: Lab Results  Component Value Date   INR 2.38* 05/05/2015   INR 2.31* 05/04/2015   INR 2.48* 03/27/2015    Telemetry: Atrial fibrillation, paced rhythm with some PVCs  Assessment/Plan:  1.  Acute on chronic systolic heart failure-wonder about contribution of ventricular pacing as well as left bundle 2.  Chronic atrial  fibrillation/flutter 3.  Long-term use of anticoagulation 4.  Stage 3-4 chronic kidney disease 5.  Coronary artery disease with previous inferior infarction previous bypass grafting  Recommendations:  Continue intravenous diuresis.  May gingerly try low-dose Entresto as well as low-dose beta blockers.  Blood pressure is somewhat borderline so will need to be careful with this.  I'm can ask electrophysiology to see the patient to determine if daily candidate for biventricular pacing.  Significant deterioration of LV function since the beginning of the year.  Really not a good candidate for revascularization.     Kerry Hough  MD [facility]  05/05/2015, 8:29 AM

## 2015-05-05 NOTE — Progress Notes (Signed)
Initial Nutrition Assessment  DOCUMENTATION CODES:   Severe malnutrition in context of chronic illness  INTERVENTION:  Provide Ensure Enlive po once daily, each supplement provides 350 kcal and 20 grams of protein Provide Boost Plus once daily, provides 360 kcal and 13 grams of protein Provide snacks BID   NUTRITION DIAGNOSIS:   Malnutrition related to poor appetite, chronic illness as evidenced by severe depletion of body fat, severe depletion of muscle mass, percent weight loss.   GOAL:   Patient will meet greater than or equal to 90% of their needs   MONITOR:   PO intake, Supplement acceptance, Labs, Weight trends, I & O's, Skin  REASON FOR ASSESSMENT:   Malnutrition Screening Tool    ASSESSMENT:   81 year old male has a history of chronic combined systolic and diastolic heart failure. He has known coronary artery disease with previous bypass grafting and has chronic atrial flutter as well as a permanent pacemaker. He has recently been diagnosed with bladder cancer and underwent TURBT the first part of November. He has had failure to thrive for most of the year and also has underlying pulmonary issues. He has lost a considerable amount of weight this year and is considerably malnourished. After his most recent bladder surgery he began to have significant scrotal edema and other edema of his legs.  Per review of weight history, pt has lost from 184 lbs to 137 lbs in the past year- 25% weight loss. He states that he has a poor appetite and eats small amounts. Per pt's son at bedside, pt has been eating fairly well recently, eating small amounts several times per day and drinking Boost supplement once daily. Per nursing notes, pt is eating 25% of most meals. Pt is agreeable to receiving chocolate nutritional supplements and snacks.  RD provided and discussed "Suggestions for Increasing Calorie and Protein" handout from the Academy of [employer] Dietetics.   Labs: low  potassium, low chloride, low calcium  Diet Order:  Diet 2 gram sodium Room service appropriate?: Yes; Fluid consistency:: Thin  Skin:  Reviewed, no issues  Last BM:  12/06  Height:   Ht Readings from Last 1 Encounters:  05/04/15 5\' 10"  (1.778 m)    Weight:   Wt Readings from Last 1 Encounters:  05/05/15 137 lb 6.4 oz (62.324 kg)    Ideal Body Weight:  75.5 kg  BMI:  Body mass index is 19.71 kg/(m^2).  Estimated Nutritional Needs:   Kcal:  [id number]  Protein:  75-85 grams  Fluid:  1.9 L/day  EDUCATION NEEDS:   No education needs identified at this time  [place], LDN Inpatient Clinical Dietitian Pager: [phone] After Hours Pager: [phone]

## 2015-05-05 NOTE — Progress Notes (Signed)
Pt transferred to rm 07, CCMD notified.

## 2015-05-05 NOTE — Consult Note (Signed)
ELECTROPHYSIOLOGY CONSULT NOTE    Patient ID: BRAYANT SOBH MRN: [id number], DOB/AGE: [id number] 79 y.o.  Admit date: 05/04/2015 Date of Consult: 05/05/2015   Primary Physician: Gwendolyn Grant, MD Primary Cardiologist: Dr. Wynonia Lawman  Reason for Consultation: cardiomyopathy  HPI: Edward [name] is a 81 y.o. male with PMHx of COPD,CAD/CABG last intervention appears July 2014 remotely, LBBB, heart block with MDT pacemaker, now persistent/chronic AFlutter/fibrillation (on Warfarin but notes suggest some compliance issues with this), fairly recently diagnosed with bladder cancer TURBT in January and November, noting invasive high grade carcinoma.  He is also described as sufferring with failure to thrive in the last visit with PMD.  He was admitted with worseing LE edema, and CHF.  The patient states that he came with increasing DOE, dates back to the time of his surgery in January never felt like he quite got back to his baseline.  He denies CP, but reports last night and for a few weeks difficulty with sleep needing to sit up to breath easier.  No palpitations, no dizziness, no syncope  This morning he is feeling a bit better, feels like his breathing is easier but not to his baseline.  Past Medical History  Diagnosis Date  . Gout     "only once in my lifetime" (12/18/2012)  . Depression   . PVD (peripheral vascular disease) ([place])     s/p B CEA  . COPD (chronic obstructive pulmonary disease) ([place])      PFT 6.19.07: FEV1 465  ratio 60 with 25% response to B2.  > PFTs 8.29.07: FEV1 61%  ratio 46% no better after B2.  > add on advair 12.20.11-improved 1.31.12  . AAA (abdominal aortic aneurysm) ([place])   . Melanosis coli   . Internal hemorrhoids   . Carotid artery occlusion   . Aortic valve disorder   . Chronic combined systolic and diastolic heart failure ([place])   . CAD (coronary artery disease), native coronary artery     CABG w LIMA to LAD, SVG to dx, OM, RCA 1989 Dr. Arlyce Dice for 3VD  PTCA of OM, 1999 and 2000 Cath showed occlusion of left main and RCA with stenosis in OM Redo redo CABG w SVG to OM, SVG to RCA8/10/00 Dr. Cyndia Bent   . Hyperlipidemia   . BPH (benign prostatic hypertrophy)   . Hypertensive heart disease     Change toprol to bisoprolol 10 mg daily on trial basis  05/21/2012  - notified by [facility] non adherent 07/17/2012    . LBBB (left bundle branch block)   . Second degree heart block 12/18/2012    s/p MDT Adapta L pacemaker 12-20-2012 by Dr Caryl Comes  . Shingles     mild  . Chronic kidney disease stage III (GFR 30-59 ml/min)     saw dr Justin Mend oct 2015 and released by dr webb  . Congenital absence of kidney     "noted during AAA repair; never knew it before" (12/18/2012)  . Myocardial infarction ([place]) 1979    times 2  . Presence of permanent cardiac pacemaker   . Cancer of bladder wall ([place]) 06/16/2014     Surgical History:  Past Surgical History  Procedure Laterality Date  . Cholecystectomy  1998  . Hemiarthroplasty shoulder fracture Right ~ 2008     x 2 - [place]  . Carotid endarterectomy Bilateral 1990's  . Abdominal aortic aneurysm repair  1998  . Carotid-subclavian bypass graft Left 1990's  . Anal fissure repair  12/18/2012 "I don't remember this"  . Hemorroidectomy      12/18/2012 "I don't remember this"  . Carpal tunnel release Right 1980's    "Dr. Marrian Salvage" (12/18/2012)  . Cardiac catheterization      "2 or 3" (12/18/2012)  . Coronary angioplasty with stent placement      "I've got 1 or 2" (12/18/2012)  . Hip fracture surgery  1944    "fell out of a tree; had it operated on 3 times" (12/18/2012)  . Refractive surgery Bilateral 1990's?  . Pacemaker insertion  12-20-2012    dual chamber Medtronic Adapta L pacemaker implanted by Dr Caryl Comes  . Cardioversion N/A 12/30/2013    Procedure: CARDIOVERSION;  Surgeon: Jacolyn Reedy, MD;  Location: [facility];  Service: Cardiovascular;  Laterality: N/A;  . Permanent pacemaker insertion N/A 12/20/2012     Procedure: PERMANENT PACEMAKER INSERTION;  Surgeon: Deboraha Sprang, MD;  Location: [facility] CATH LAB;  Service: Cardiovascular;  Laterality: N/A;  . Coronary artery bypass graft  1989, 2000     "CABG X ?3; CABG X 5 w//Bartle" (12/18/2012)  . Transurethral resection of bladder tumor N/A 06/16/2014    Procedure: TRANSURETHRAL RESECTION OF BLADDER TUMOR (TURBT) ;  Surgeon: Jorja Loa, MD;  Location: [facility];  Service: Urology;  Laterality: N/A;  . Cardioversion N/A 09/26/2014    Procedure: CARDIOVERSION;  Surgeon: Jacolyn Reedy, MD;  Location: [facility];  Service: Cardiovascular;  Laterality: N/A;  . Eye surgery      bil catarcts with lens implants on right eye  . Transurethral resection of bladder tumor N/A 04/02/2015    Procedure: TRANSURETHRAL RESECTION OF BLADDER TUMOR (TURBT);  Surgeon: Franchot Gallo, MD;  Location: [facility];  Service: Urology;  Laterality: N/A;  CYSTOSCOPY        Prescriptions prior to admission  Medication Sig Dispense Refill Last Dose  . diazepam (VALIUM) 2 MG tablet Take 1 tablet (2 mg total) by mouth at bedtime as needed for sedation. 30 tablet 0 Past Week at Unknown time  . DULERA 100-5 MCG/ACT AERO Inhale 2 puffs into the lungs 2 (two) times daily. 3 Inhaler 3 05/04/2015 at Unknown time  . furosemide (LASIX) 80 MG tablet Take 80 mg by mouth 2 (two) times daily.   05/04/2015 at Unknown time  . rosuvastatin (CRESTOR) 20 MG tablet Take 20 mg by mouth daily.     05/04/2015 at Unknown time  . tiotropium (SPIRIVA) 18 MCG inhalation capsule Place 1 capsule (18 mcg total) into inhaler and inhale daily. 90 capsule 3 05/04/2015 at Unknown time  . warfarin (COUMADIN) 1 MG tablet Take 1 tablet (1 mg total) by mouth daily. Alternates taking 0.5mg  and 1mg  each day. (Patient taking differently: Take 1 mg by mouth daily. ) 30 tablet 0 05/03/2015 at 1700  . furosemide (LASIX) 40 MG tablet Take 40 mg by mouth 2 (two) times daily.    Taking  . nitroGLYCERIN (NITROSTAT) 0.4 MG SL  tablet Place 0.4 mg under the tongue every 5 (five) minutes as needed for chest pain.   unknown at unknown  . polyethylene glycol (MIRALAX / GLYCOLAX) packet Take 17 g by mouth daily. (Patient not taking: Reported on 05/04/2015) 14 each 0 Taking    Inpatient Medications:  . [START ON 05/06/2015] feeding supplement (ENSURE ENLIVE)  237 mL Oral Q24H  . furosemide  80 mg Intravenous 3 times per day  . lactose free nutrition  237 mL Oral Q24H  . mometasone-formoterol  2  puff Inhalation BID  . polyethylene glycol  17 g Oral Daily  . potassium chloride  40 mEq Oral BID  . sodium chloride  3 mL Intravenous Q12H  . tiotropium  18 mcg Inhalation Daily  . warfarin  1 mg Oral q1800  . Warfarin - Pharmacist Dosing Inpatient   Does not apply q1800    Allergies:  Allergies  Allergen Reactions  . Ace Inhibitors Cough  . Clarithromycin Other (See Comments)    Strange thoughts and fell with biaxin  . Codeine Nausea And Vomiting  . Oxycodone-Acetaminophen Other (See Comments)    Felt closed in  . Viberzi [Eluxadoline] Other (See Comments)    Severe cramps and impaction    Social History   Social History  . Marital Status: Widowed    Spouse Name: N/A  . Number of Children: N/A  . Years of Education: N/A   Occupational History  . retired from [employer]  . Smoking status: Former Smoker -- 1.50 packs/day for 50 years    Types: Cigarettes    Quit date: 05/30/1996  . Smokeless tobacco: Never Used  . Alcohol Use: 1.8 oz/week    3 Cans of beer per week     Comment: beer few times per week  . Drug Use: No  . Sexual Activity: No   Other Topics Concern  . Not on file   Social History Narrative   Lives alone. Widower.     Family History  Problem Relation Age of Onset  . Heart disease Mother   . [place] cancer Neg Hx      Review of Systems: General: No chills, fever, night sweats or weight changes  Cardiovascular:  No chest pain, dyspnea on exertion,  edema, orthopnea, palpitations, paroxysmal nocturnal dyspnea Dermatological: No rash, lesions or masses Respiratory: No cough, dyspnea Urologic: No hematuria, dysuria Abdominal: No nausea, vomiting, diarrhea, bright red blood per rectum, melena, or hematemesis Neurologic: No visual changes, weakness, changes in mental status All other systems reviewed and are otherwise negative except as noted above.  Physical Exam: Filed Vitals:   05/04/15 2145 05/05/15 0544 05/05/15 1036 05/05/15 1158  BP:  99/62  106/73  Pulse: 88 97  96  Temp:  97.8 F (36.6 C)  97.5 F (36.4 C)  TempSrc:  Oral  Oral  Resp: 16 16  18   Height:      Weight:  137 lb 6.4 oz (62.324 kg)    SpO2: 95% 97% 94% 98%    GEN- The patient is frail and fragile appearing, alert and oriented x 3 today.   HEENT: normocephalic, atraumatic; sclera clear, conjunctiva pink; hearing intact; oropharynx clear; neck supple, + JVD Lungs-  Diminished at the bases bilaterally, normal work of breathing.  Soft exp wheezes, no rales, rhonchi Heart-  irregular rhythm, no murmurs, rubs or gallops, PMI not laterally displaced GI- soft, non-tender, non-distended, bowel sounds present Extremities- no clubbing, cyanosis, 2+  edema MS- diffuse atrophy, cachexic Skin- pacemaker site is well healed Psych- euthymic mood, full affect Neuro- no gross deficits observed  Labs:   Lab Results  Component Value Date   WBC 5.5 05/04/2015   HGB 10.3* 05/04/2015   HCT 32.3* 05/04/2015   MCV 85.0 05/04/2015   PLT 161 05/04/2015    Recent Labs Lab 05/04/15 1205 05/05/15 0533  NA 133* 138  K 3.5 3.3*  CL 95* 98*  CO2 29 31  BUN 42* 41*  CREATININE 2.04* 1.93*  CALCIUM 8.5* 8.5*  PROT 6.9  --   BILITOT 1.7*  --   ALKPHOS 145*  --   ALT 20  --   AST 34  --   GLUCOSE 138* 134*      Radiology/Studies:  Ct Abdomen Pelvis Wo Contrast 05/04/2015  CLINICAL DATA:  81 year old male with history of dyspnea and weight loss. EXAM: CT ABDOMEN AND  PELVIS WITHOUT CONTRAST TECHNIQUE: Multidetector CT imaging of the abdomen and pelvis was performed following the standard protocol without IV contrast. COMPARISON:  CT the abdomen and pelvis 01/21/2015. FINDINGS: Lower chest: Small left and small to moderate right pleural effusions. Some areas of passive subsegmental atelectasis are noted in lung bases bilaterally. In addition, there is extensive bronchial wall thickening and thickening of the peribronchovascular interstitium, most pronounced in the right lower lobe where there is some associated peribronchovascular ground-glass attenuation. Cardiomegaly. Atherosclerotic calcifications in the left anterior descending, left circumflex and right coronary arteries. Pacemaker leads terminating in the right atrium and right ventricular apex. Hepatobiliary: No discrete cystic or solid hepatic lesions are confidently identified on today's noncontrast CT examination. Status post cholecystectomy. Capsular calcification along the posterior surface of the right lobe of the liver is unchanged. Pancreas: No discrete pancreatic mass or peripancreatic inflammatory changes noted on today's noncontrast CT examination. Spleen: Unremarkable. Adrenals/Urinary Tract: Severe atrophy of the left kidney. Multiple low-attenuation lesions associated with the left kidney appears similar to the prior study from 01/21/2015, measuring up to 4.2 cm in the interpolar region, incompletely characterized on today's noncontrast CT examination, but previously characterized as cysts. Multiple other smaller low-attenuation lesions in the right kidney are incompletely characterized but similar to the prior examination. Vascular calcifications in both renal hila. No hydroureteronephrosis. Unenhanced appearance of the urinary bladder is unremarkable. Bilateral adrenal glands are normal in appearance. Stomach/Bowel: Unenhanced appearance of the stomach is normal. No pathologic dilatation of small bowel or  colon. Vascular/Lymphatic: Extensive atherosclerotic calcifications are identified throughout the abdominal and pelvic vasculature. Again noted is a focal outpouching of the left wall of the aorta near the origin of the left renal artery which measures up to 17 mm in diameter, which is similar to prior examinations. Enlarged right inguinal lymph node measuring 2.2 cm in short axis (image 79 of series 2). No other definite lymphadenopathy confidently identified in the abdomen or pelvis on today's noncontrast CT examination. Reproductive: Prostate gland and seminal vesicles are unremarkable in appearance. Other: Small to moderate volume of ascites. Mild diffuse mesenteric edema. No pneumoperitoneum. Musculoskeletal: Diffuse body wall edema. There are no aggressive appearing lytic or blastic lesions noted in the visualized portions of the skeleton. Old compression fractures of superior endplate of L1 and inferior endplate of L3 are again noted, both of which demonstrate approximately 30% loss of anterior vertebral body height. Bilateral pars defects at L5. 4 mm of anterolisthesis of L5 upon S1. There are no aggressive appearing lytic or blastic lesions noted in the visualized portions of the skeleton. IMPRESSION: 1. The appearance of the abdomen and pelvis suggest a state of anasarca, as evidenced by edematous changes in the visualized lung bases, bilateral pleural effusions, small to moderate volume of ascites, diffuse mesenteric edema and diffuse body wall edema. 2. No definite acute findings noted in the abdomen or pelvis on today's noncontrast CT examination. 3. Extensive atherosclerosis, including multivessel coronary artery disease. 4. Additional incidental findings, as above. Electronically Signed   By: Vinnie Langton M.D.   On: 05/04/2015 17:43   Dg Chest  2 View 05/04/2015  CLINICAL DATA:  Shortness of Breath EXAM: CHEST - 2 VIEW COMPARISON:  04/21/2015 FINDINGS: Cardiac shadow remains mildly enlarged.  Postsurgical changes are again seen as is a pacing device. Increased vascular congestion and interstitial edema is noted. Some increased density in the right lung base is seen which may represent some superimposed infiltrate. There remains thickening of the minor and major fissures on right. IMPRESSION: Changes consistent with CHF and likely mild early basilar infiltrate on the right. Electronically Signed   By: Inez Catalina M.D.   On: 05/04/2015 16:58    EKG: AFlutter, LBBB TELEMETRY:  Afib CVR occ pacing 05/04/15: Echocardiogram Study Conclusions - Left ventricle: The cavity size was normal. Wall thickness was increased in a pattern of mild LVH. The estimated ejection fraction was 15%. Diffuse hypokinesis. Features are consistent with a pseudonormal left ventricular filling pattern, with concomitant abnormal relaxation and increased filling pressure (grade 2 diastolic dysfunction). - Aortic valve: Trileaflet; moderately calcified leaflets. Sclerosis without stenosis. There was trivial regurgitation. - Mitral valve: Moderately calcified annulus. Mildly calcified leaflets . There was mild regurgitation. - Left atrium: The atrium was moderately dilated. - Right ventricle: The cavity size was normal. Pacer wire or catheter noted in right ventricle. Systolic function was moderately reduced. - Right atrium: The atrium was mildly dilated. - Tricuspid valve: There was moderate regurgitation. Peak RV-RA gradient (S): 46 mm Hg. - Pulmonary arteries: PA peak pressure: 54 mm Hg (S). - Pericardium, extracardiac: IVC measured 1.8 cm with < 50% respirophasic variation, suggesting RA pressure 8 mmHg. Impressions: - Normal LV size with mild LV hypertrophy. EF 15%, diffuse hypokinesis. Moderate diastolic dysfunction. Aortic sclerosis without significant stenosis. Mild mitral regurgitation. Normal RV size with moderately decreased systolic function. Moderate pulmonary  hypertension. Moderate TR.   DEVICE HISTORY: MDT PPM  Assessment and Plan:  1. CHF, anasarca     Continue diuresis/lytes with general cardiology, -918ml  2. ICM, LBB     Worsening LVEF, 15%, (40-45% in January)     unknown %RV pacing, will check device     Does not appear to be on BB or ACE/ARB tx (though has CRI)  3. Permanent AF by notes     Warfarin is therapeutic, c/w attending cardiologist, H/H appears stable  4. CRI, stage 3-4  5. CAD   Signed, Tommye Standard, PA-C 05/05/2015 1:51 PM  I have seen, examined the patient, and reviewed the above assessment and plan.  On exam, very frail and fragile.  IRRR.   Changes to above are made where necessary.  He has permanent atrial fibrillation and progressive general decline.  He has advanced renal failure.  EF has recently reduced from 40% to 15%.  On pacemaker interrogation, he is programmed VVIR with lower rate of 70 bpm and is 55% RV paced.  It could be that RV pacing has worsened his cardiac function.  I have therefore reprogrammed his device today to VVI 50 bpm.  This should help significant reduce RV pacing and hopefully help his EF recover.  Medical therapy for CHF is to be initiated by Dr Wynonia Lawman.  Given advanced age and comorbidities, he is not a candidate for an ICD.  Though we could consider LV lead placement, I would advise that we avoid procedures in this patient if at all possible.  I feel that his risks would be high and anticipated long term benefits low.  His prognosis is guarded at best.  If his EF does not recover with medical  therapy and pacemaker changes today, we could electively consider LV lead placement if his overall condition improves otherwise.  Electrophysiology team to see as needed while here. Please call with questions.   Co Sign: Thompson Grayer, MD 05/05/2015 5:40 PM

## 2015-05-06 LAB — BASIC METABOLIC PANEL
Anion gap: 11 (ref 5–15)
BUN: 39 mg/dL — AB (ref 6–20)
CHLORIDE: 98 mmol/L — AB (ref 101–111)
CO2: 29 mmol/L (ref 22–32)
Calcium: 8.7 mg/dL — ABNORMAL LOW (ref 8.9–10.3)
Creatinine, Ser: 1.96 mg/dL — ABNORMAL HIGH (ref 0.61–1.24)
GFR calc Af Amer: 35 mL/min — ABNORMAL LOW (ref >60)
GFR calc non Af Amer: 30 mL/min — ABNORMAL LOW (ref >60)
GLUCOSE: 75 mg/dL (ref 65–99)
POTASSIUM: 3.8 mmol/L (ref 3.5–5.1)
Sodium: 138 mmol/L (ref 135–145)

## 2015-05-06 LAB — PROTIME-INR
INR: 2.78 — ABNORMAL HIGH (ref 0.00–1.49)
Prothrombin Time: 28.9 seconds — ABNORMAL HIGH (ref 11.6–15.2)

## 2015-05-06 MED ORDER — WARFARIN 0.5 MG HALF TABLET
0.5000 mg | ORAL_TABLET | Freq: Once | ORAL | Status: AC
  Administered 2015-05-06: 0.5 mg via ORAL
  Filled 2015-05-06: qty 1

## 2015-05-06 MED ORDER — BISOPROLOL FUMARATE 5 MG PO TABS
2.5000 mg | ORAL_TABLET | Freq: Every day | ORAL | Status: DC
  Administered 2015-05-06 – 2015-05-12 (×7): 2.5 mg via ORAL
  Filled 2015-05-06 (×7): qty 1

## 2015-05-06 NOTE — Progress Notes (Signed)
ANTICOAGULATION CONSULT NOTE - Follow Up Consult  Pharmacy Consult for warfarin Indication: atrial fibrillation  Allergies  Allergen Reactions  . Ace Inhibitors Cough  . Clarithromycin Other (See Comments)    Strange thoughts and fell with biaxin  . Codeine Nausea And Vomiting  . Oxycodone-Acetaminophen Other (See Comments)    Felt closed in  . Viberzi [Eluxadoline] Other (See Comments)    Severe cramps and impaction    Patient Measurements: Height: 5\' 10"  (177.8 cm) Weight: 123 lb 9.6 oz (56.065 kg) (a scale) IBW/kg (Calculated) : 73  Vital Signs: Temp: 97.3 F (36.3 C) (12/07 1244) Temp Source: Oral (12/07 1244) BP: 88/66 mmHg (12/07 1244) Pulse Rate: 94 (12/07 0600)  Labs:  Recent Labs  05/04/15 1205 05/05/15 0533 05/06/15 0406  HGB 10.3*  --   --   HCT 32.3*  --   --   PLT 161  --   --   LABPROT 25.2* 25.8* 28.9*  INR 2.31* 2.38* 2.78*  CREATININE 2.04* 1.93* 1.96*    Estimated Creatinine Clearance: 22.7 mL/min (by C-G formula based on Cr of 1.96).  Assessment: 81 y/o male with CHF, bladder cancer, and FTT admitted for inpatient diuresis. He takes chronic warfarin for Afib. INR is therapeutic at 2.78. No bleeding noted however patient fell this morning and hit head on the wall, no CBC from today so will obtain tomorrow.  PTA: 1 mg daily, last dose 12/4  Goal of Therapy:  INR 2-3 Monitor platelets by anticoagulation protocol: Yes   Plan:  - Warfarin 0.5 mg PO tonight - INR daily - CBC in am - Monitor for s/sx of bleeding  [facility], Pharm.D., BCPS Clinical Pharmacist Pager: [phone] 05/06/2015 1:10 PM

## 2015-05-06 NOTE — Progress Notes (Addendum)
Pt. found on the floor states he was trying to get back in the bed and didn't know he was tied down. Pt stated he hit his head on the wall, skin tear notied to Lt. forearm cleansed and dressing placed. VSS stable. Wilfred Lacy (Son) at the beside as incident occurred. Made aware of protocol. MD on the unit and made aware.  Spoke with MD no new orders received.

## 2015-05-06 NOTE — Progress Notes (Addendum)
Subjective:  Patient sitting up in chair at site of bed.  Diuresing and weight is evidently down today but uncertain whether it is accurate or not.  Still with some edema but anasarca has improved.  He is quite rambling in his history today.  Was seen yesterday by EP who did not recommend that he undergo resynchronization therapy despite a very wide QRS complex and some RV pacing.  Pacemaker rate was diminished.  Very poor comprehension of his overall status.  Objective:  Vital Signs in the last 24 hours: BP 113/71 mmHg  Pulse 94  Temp(Src) 97.6 F (36.4 C) (Oral)  Resp 18  Ht 5\' 10"  (1.778 m)  Wt 56.065 kg (123 lb 9.6 oz)  BMI 17.73 kg/m2  SpO2 96%  Physical Exam: Cachectic male sitting up at site of bed in no acute distress Lungs: Reduced breath sounds at the base Cardiac: Irregular rhythm rhythm, normal S1 and S2, S3 heard, 2/6 systolic murmur over aortic valve  Abdomen:  Soft, nontender, no masses Extremities:  2+ edema noted   Intake/Output from previous day: 12/06 0701 - 12/07 0700 In: 800 [P.O.:800] Out: 1930 [Urine:1930] Weight Filed Weights   05/04/15 1134 05/05/15 0544 05/06/15 0159  Weight: 64.184 kg (141 lb 8 oz) 62.324 kg (137 lb 6.4 oz) 56.065 kg (123 lb 9.6 oz)    Lab Results: Basic Metabolic Panel:  Recent Labs  05/05/15 0533 05/06/15 0406  NA 138 138  K 3.3* 3.8  CL 98* 98*  CO2 31 29  GLUCOSE 134* 75  BUN 41* 39*  CREATININE 1.93* 1.96*    CBC:  Recent Labs  05/04/15 1205  WBC 5.5  NEUTROABS 3.9  HGB 10.3*  HCT 32.3*  MCV 85.0  PLT 161    BNP    Component Value Date/Time   BNP 2095.0* 05/04/2015 1205    PROTIME: Lab Results  Component Value Date   INR 2.78* 05/06/2015   INR 2.38* 05/05/2015   INR 2.31* 05/04/2015    Telemetry: Atrial fibrillation, with occasional PVCs.   Assessment/Plan:  1.  Acute on chronic systolic heart failure-some diuresis since admission  2.  Chronic atrial fibrillation/flutter 3.  Long-term use  of anticoagulation 4.  Stage 3-4 chronic kidney disease 5.  Coronary artery disease with previous inferior infarction previous bypass grafting  Recommendations:  Appreciate electrophysiology note.  Since they will not be doing resynchronization we'll do the best that we can with medical therapy.  I think he is on the downward slope and his EF is now around 15%.  He is cachectic and I don't think there is a whole lot else that can be done other than medical management.  We'll try him on bisoprolol because of his COPD.  He was previously intolerant to ace inhibitors.  If blood pressure will allow may try a low dose of Entresto.  At this point I would wonder about palliative and hospice care.   After I wrote this note, son arrived on floor and also patient fell when getting OOB.  Discussed role of palliative care and I suggested that we start discussion about involving Palliative care/Hospice.   Son was open to that although EOL issues with his mother were difficult.  Kerry Hough  MD [facility] Cardiology  05/06/2015, 9:03 AM

## 2015-05-06 NOTE — Progress Notes (Signed)
Palliative:   I will meet with Mr. [place] and family tomorrow 12/8 ~0930-1000 to discuss [place]. Thank you for this consult.   Vinie Sill, NP Palliative Medicine Team Pager # [phone] (M-F 8a-5p) Team Phone # [phone] (Nights/Weekends)

## 2015-05-07 DIAGNOSIS — Z515 Encounter for palliative care: Secondary | ICD-10-CM

## 2015-05-07 DIAGNOSIS — I5042 Chronic combined systolic (congestive) and diastolic (congestive) heart failure: Secondary | ICD-10-CM

## 2015-05-07 DIAGNOSIS — G4700 Insomnia, unspecified: Secondary | ICD-10-CM

## 2015-05-07 LAB — BASIC METABOLIC PANEL
ANION GAP: 8 (ref 5–15)
BUN: 41 mg/dL — ABNORMAL HIGH (ref 6–20)
CALCIUM: 8.4 mg/dL — AB (ref 8.9–10.3)
CO2: 31 mmol/L (ref 22–32)
Chloride: 96 mmol/L — ABNORMAL LOW (ref 101–111)
Creatinine, Ser: 2.02 mg/dL — ABNORMAL HIGH (ref 0.61–1.24)
GFR calc Af Amer: 33 mL/min — ABNORMAL LOW (ref >60)
GFR, EST NON AFRICAN AMERICAN: 29 mL/min — AB (ref >60)
GLUCOSE: 118 mg/dL — AB (ref 65–99)
POTASSIUM: 4.2 mmol/L (ref 3.5–5.1)
SODIUM: 135 mmol/L (ref 135–145)

## 2015-05-07 LAB — CBC
HCT: 33 % — ABNORMAL LOW (ref 39.0–52.0)
Hemoglobin: 10.5 g/dL — ABNORMAL LOW (ref 13.0–17.0)
MCH: 27.3 pg (ref 26.0–34.0)
MCHC: 31.8 g/dL (ref 30.0–36.0)
MCV: 85.7 fL (ref 78.0–100.0)
PLATELETS: 149 10*3/uL — AB (ref 150–400)
RBC: 3.85 MIL/uL — AB (ref 4.22–5.81)
RDW: 17.8 % — AB (ref 11.5–15.5)
WBC: 5.1 10*3/uL (ref 4.0–10.5)

## 2015-05-07 LAB — PROTIME-INR
INR: 2.83 — ABNORMAL HIGH (ref 0.00–1.49)
PROTHROMBIN TIME: 29.3 s — AB (ref 11.6–15.2)

## 2015-05-07 MED ORDER — FUROSEMIDE 80 MG PO TABS
80.0000 mg | ORAL_TABLET | Freq: Two times a day (BID) | ORAL | Status: DC
  Administered 2015-05-07 – 2015-05-09 (×4): 80 mg via ORAL
  Filled 2015-05-07 (×4): qty 1

## 2015-05-07 MED ORDER — WARFARIN 0.5 MG HALF TABLET
0.5000 mg | ORAL_TABLET | Freq: Once | ORAL | Status: AC
  Administered 2015-05-07: 0.5 mg via ORAL
  Filled 2015-05-07: qty 1

## 2015-05-07 MED ORDER — TRAZODONE HCL 50 MG PO TABS
25.0000 mg | ORAL_TABLET | Freq: Every day | ORAL | Status: DC
  Administered 2015-05-07: 25 mg via ORAL
  Filled 2015-05-07 (×4): qty 1

## 2015-05-07 NOTE — Progress Notes (Signed)
ANTICOAGULATION CONSULT NOTE - Follow Up Consult  Pharmacy Consult for warfarin Indication: atrial fibrillation  Allergies  Allergen Reactions  . Ace Inhibitors Cough  . Clarithromycin Other (See Comments)    Strange thoughts and fell with biaxin  . Codeine Nausea And Vomiting  . Oxycodone-Acetaminophen Other (See Comments)    Felt closed in  . Viberzi [Eluxadoline] Other (See Comments)    Severe cramps and impaction    Patient Measurements: Height: 5\' 10"  (177.8 cm) Weight: 128 lb 6.4 oz (58.242 kg) IBW/kg (Calculated) : 73  Vital Signs: Temp: 97.2 F (36.2 C) (12/08 0500) Temp Source: Axillary (12/08 0500) BP: 95/64 mmHg (12/08 0500) Pulse Rate: 86 (12/08 0732)  Labs:  Recent Labs  05/04/15 1205 05/05/15 0533 05/06/15 0406 05/07/15 0227  HGB 10.3*  --   --  10.5*  HCT 32.3*  --   --  33.0*  PLT 161  --   --  149*  LABPROT 25.2* 25.8* 28.9* 29.3*  INR 2.31* 2.38* 2.78* 2.83*  CREATININE 2.04* 1.93* 1.96* 2.02*    Estimated Creatinine Clearance: 22.8 mL/min (by C-G formula based on Cr of 2.02).  Assessment: 81 y/o male with CHF, bladder cancer, and FTT admitted for inpatient diuresis. He takes chronic warfarin for Afib. INR is therapeutic at 2.83- up, lower dose given yesterday for sharp rise in INR. No bleeding noted however patient fell yesterday and hit head on the wall. H/H remains low-stable. Plts slightly down at 149. No overt bleeding noted.   PTA: 1 mg daily, last dose 12/4  Goal of Therapy:  INR 2-3 Monitor platelets by anticoagulation protocol: Yes   Plan:  - Warfarin 0.5 mg PO tonight - INR daily - CBC in am - Monitor for s/sx of bleeding  Sloan Leiter, PharmD, BCPS Clinical Pharmacist [phone]  05/07/2015 10:08 AM

## 2015-05-07 NOTE — Care Management Important Message (Signed)
Important Message  Patient Details  Name: Edward [name] MRN: [id number] Date of Birth: May 01, 1930   Medicare Important Message Given:  Yes    Fateh Kindle P [place] 05/07/2015, 11:17 AM

## 2015-05-07 NOTE — Consult Note (Signed)
Consultation Note Date: 05/07/2015   Patient Name: Edward [name]  DOB: [birth year]  MRN: [id number]  Age / Sex: 81 y.o., male  PCP: Rowe Clack, MD Referring Physician: Jacolyn Reedy, MD  Reason for Consultation: Establishing goals of care    Clinical Assessment/Narrative: I met this morning with Edward [name] along with his son, Edward [name], at bedside. I attempted to engage Edward [name] but he kept falling asleep and seemed confused when he did speak. He did ramble about being concerned about insulting people and that he doesn't mean to do that. He does allude to having a "new normal" as Dr. Wynonia Lawman explained. He did have some insight but briefly. I asked him about dying and he says "we all have too" and says he is not scared of this. Unable to discuss anymore as he fell off to sleep.   I did speak with his son Edward [name] outside the room who explained that Edward [name] has always been very private and internalizes everything. He has completed a will per Edward [name] but they do not even know what this says. They were never allowed in his doctors visits. Edward [name] has struggled to help keep him safe at home but his father is so independent this has been difficult. We discuss his poor prognosis and limited options. Edward [name] is very understanding. They have never really discussed end of life issues. Edward [name] says it was extremely difficult when his mother, Edward [name] wife, died from liver cancer 10-Aug-2008. They never really accepted that she was terminal until the very end and went through terrible experiences in and out of SNF. Edward [name] says that they would like to take him home even if this was only for a short time they could do it. We discuss resuscitation and hospice. Edward [name] seems reasonable but is hopeful his father can make these decisions himself as he does not want to take away his respect and dignity (he is unable today) - I did prepare Edward [name] that this  may not be possible and may come to him and his brother to decide.   I will follow up tomorrow. Asked nursing to call me if [name] is more alert and lucid for conversation. Insomnia, weakness, and poor appetite are issues according to [facility].   Contacts/Participants in Discussion: Primary Decision Maker: Children- met with Edward [name] today    SUMMARY OF RECOMMENDATIONS - Recommend DNR and comfort focus with hospice to protect his dignity. - Will follow up with patient tomorrow. - Trial of trazodone tonight for sleep.   Code Status/Advance Care Planning: Full code - for now until further discussion    Code Status Orders        Start     Ordered   05/04/15 1131  Full code   Continuous     05/04/15 1130    Advance Directive Documentation        Most Recent Value   Type of Advance Directive  Living will [dt is poa/not hcpoa. kathy nance-]   Pre-existing out of facility DNR order (yellow form or pink MOST form)     "MOST" Form in Place?         Symptom Management:   Insomnia: Trial trazodone 25 mg tonight.   Decreased appetite: Encourage foods he likes. Encourage supplements.   Weakness: PT to see.   Palliative Prophylaxis:   Bowel Regimen and Delirium Protocol  Psycho-social/Spiritual:  Support System: Strong Desire for further Chaplaincy support:no Additional Recommendations: Caregiving  Support/Resources  Prognosis: < 6 months  Discharge Planning: To be determined.    Chief Complaint/ Primary Diagnoses: Present on Admission:  . Acute on chronic systolic (congestive) heart failure ([place]) . Chronic combined systolic and diastolic heart failure ([place])  I have reviewed the medical record, interviewed the patient and family, and examined the patient. The following aspects are pertinent.  Past Medical History  Diagnosis Date  . Gout     "only once in my lifetime" (12/18/2012)  . Depression   . PVD (peripheral vascular disease) ([place])     s/p B CEA  . COPD  (chronic obstructive pulmonary disease) ([place])      PFT 6.19.07: FEV1 465  ratio 60 with 25% response to B2.  > PFTs 8.29.07: FEV1 61%  ratio 46% no better after B2.  > add on advair 12.20.11-improved 1.31.12  . AAA (abdominal aortic aneurysm) ([place])   . Melanosis coli   . Internal hemorrhoids   . Carotid artery occlusion   . Aortic valve disorder   . Chronic combined systolic and diastolic heart failure ([place])   . CAD (coronary artery disease), native coronary artery     CABG w LIMA to LAD, SVG to dx, OM, RCA 1989 Dr. Arlyce Dice for 3VD PTCA of OM, 1999 and 2000 Cath showed occlusion of left main and RCA with stenosis in OM Redo redo CABG w SVG to OM, SVG to RCA8/10/00 Dr. Cyndia Bent   . Hyperlipidemia   . BPH (benign prostatic hypertrophy)   . Hypertensive heart disease     Change toprol to bisoprolol 10 mg daily on trial basis  05/21/2012  - notified by [facility] non adherent 07/17/2012    . LBBB (left bundle branch block)   . Second degree heart block 12/18/2012    s/p MDT Adapta L pacemaker 12-20-2012 by Dr Caryl Comes  . Shingles     mild  . Chronic kidney disease stage III (GFR 30-59 ml/min)     saw dr Justin Mend oct 2015 and released by dr webb  . Congenital absence of kidney     "noted during AAA repair; never knew it before" (12/18/2012)  . Myocardial infarction ([place]) 1979    times 2  . Presence of permanent cardiac pacemaker   . Cancer of bladder wall ([place]) 06/16/2014   Social History   Social History  . Marital Status: Widowed    Spouse Name: N/A  . Number of Children: N/A  . Years of Education: N/A   Occupational History  . retired from [employer]  . Smoking status: Former Smoker -- 1.50 packs/day for 50 years    Types: Cigarettes    Quit date: 05/30/1996  . Smokeless tobacco: Never Used  . Alcohol Use: 1.8 oz/week    3 Cans of beer per week     Comment: beer few times per week  . Drug Use: No  . Sexual Activity: No   Other Topics Concern  . None   Social  History Narrative   Lives alone. Widower.   Family History  Problem Relation Age of Onset  . Heart disease Mother   . Colon cancer Neg Hx    Scheduled Meds: . bisoprolol  2.5 mg Oral Daily  . feeding supplement (ENSURE ENLIVE)  237 mL Oral Q24H  . furosemide  80 mg Oral BID  . lactose free nutrition  237 mL Oral Q24H  . mometasone-formoterol  2 puff Inhalation BID  . polyethylene glycol  17 g Oral Daily  . sodium chloride  3 mL Intravenous Q12H  . tiotropium  18 mcg Inhalation Daily  . warfarin  0.5 mg Oral ONCE-1800  . Warfarin - Pharmacist Dosing Inpatient   Does not apply q1800   Continuous Infusions:  PRN Meds:.sodium chloride, acetaminophen, diazepam, nitroGLYCERIN, ondansetron (ZOFRAN) IV, sodium chloride Medications Prior to Admission:  Prior to Admission medications   Medication Sig Start Date End Date Taking? Authorizing Provider  diazepam (VALIUM) 2 MG tablet Take 1 tablet (2 mg total) by mouth at bedtime as needed for sedation. 04/16/15  Yes Rowe Clack, MD  DULERA 100-5 MCG/ACT AERO Inhale 2 puffs into the lungs 2 (two) times daily. 11/26/14  Yes Tanda Rockers, MD  furosemide (LASIX) 80 MG tablet Take 80 mg by mouth 2 (two) times daily.   Yes Historical Provider, MD  rosuvastatin (CRESTOR) 20 MG tablet Take 20 mg by mouth daily.     Yes Historical Provider, MD  tiotropium (SPIRIVA) 18 MCG inhalation capsule Place 1 capsule (18 mcg total) into inhaler and inhale daily. 03/04/15 03/03/16 Yes Tanda Rockers, MD  warfarin (COUMADIN) 1 MG tablet Take 1 tablet (1 mg total) by mouth daily. Alternates taking 0.48m and 151meach day. Patient taking differently: Take 1 mg by mouth daily.  04/16/15  Yes VaRowe ClackMD  furosemide (LASIX) 40 MG tablet Take 40 mg by mouth 2 (two) times daily.     Historical Provider, MD  nitroGLYCERIN (NITROSTAT) 0.4 MG SL tablet Place 0.4 mg under the tongue every 5 (five) minutes as needed for chest pain.    Historical Provider, MD    polyethylene glycol (MIRALAX / GLYCOLAX) packet Take 17 g by mouth daily. Patient not taking: Reported on 05/04/2015 01/21/15   MaCharlesetta ShanksMD   Allergies  Allergen Reactions  . Ace Inhibitors Cough  . Clarithromycin Other (See Comments)    Strange thoughts and fell with biaxin  . Codeine Nausea And Vomiting  . Oxycodone-Acetaminophen Other (See Comments)    Felt closed in  . Viberzi [Eluxadoline] Other (See Comments)    Severe cramps and impaction    Review of Systems  Unable to perform ROS   Physical Exam  Constitutional: He appears lethargic. He appears cachectic.  Cardiovascular: Normal rate.   Respiratory: Effort normal. No accessory muscle usage. No tachypnea. No respiratory distress.  GI: Soft. Normal appearance.  Neurological: He appears lethargic. He is disoriented.    Vital Signs: BP 90/64 mmHg  Pulse 70  Temp(Src) 97.5 F (36.4 C) (Oral)  Resp 18  Ht _0  (1.778 m)  Wt 58.242 kg (128 lb 6.4 oz)  BMI 18.42 kg/m2  SpO2 96%  SpO2: SpO2: 96 % O2 Device:SpO2: 96 % O2 Flow Rate: .O2 Flow Rate (L/min): 0 L/min  IO: Intake/output summary:  Intake/Output Summary (Last 24 hours) at 05/07/15 1118 Last data filed at 05/07/15 1100  Gross per 24 hour  Intake    670 ml  Output    550 ml  Net    120 ml    LBM:   Baseline Weight: Weight: 64.184 kg (141 lb 8 oz) Most recent weight: Weight: 58.242 kg (128 lb 6.4 oz)      Palliative Assessment/Data:  Flowsheet Rows        Most Recent Value   Intake Tab    Referral Department  Cardiology   Unit at Time of Referral  Cardiac/Telemetry Unit   Palliative Care Primary Diagnosis  Cardiac   Date Notified  05/06/15   Palliative  Care Type  New Palliative care   Reason for referral  Clarify Goals of Care   Date of Admission  05/04/15   Date first seen by Palliative Care  05/07/15   # of days Palliative referral response time  1 Day(s)   # of days IP prior to Palliative referral  2   Clinical Assessment     Psychosocial & Spiritual Assessment    Palliative Care Outcomes       Additional Data Reviewed:  CBC:    Component Value Date/Time   WBC 5.1 05/07/2015 0227   WBC 8.7 07/07/2014   HGB 10.5* 05/07/2015 0227   HCT 33.0* 05/07/2015 0227   PLT 149* 05/07/2015 0227   MCV 85.7 05/07/2015 0227   NEUTROABS 3.9 05/04/2015 1205   LYMPHSABS 0.7 05/04/2015 1205   MONOABS 0.8 05/04/2015 1205   EOSABS 0.1 05/04/2015 1205   BASOSABS 0.0 05/04/2015 1205   Comprehensive Metabolic Panel:    Component Value Date/Time   NA 135 05/07/2015 0227   NA 136* 07/01/2014   K 4.2 05/07/2015 0227   CL 96* 05/07/2015 0227   CO2 31 05/07/2015 0227   BUN 41* 05/07/2015 0227   BUN 22* 07/01/2014   CREATININE 2.02* 05/07/2015 0227   CREATININE 1.4* 07/01/2014   GLUCOSE 118* 05/07/2015 0227   CALCIUM 8.4* 05/07/2015 0227   AST 34 05/04/2015 1205   ALT 20 05/04/2015 1205   ALKPHOS 145* 05/04/2015 1205   BILITOT 1.7* 05/04/2015 1205   PROT 6.9 05/04/2015 1205   ALBUMIN 2.4* 05/04/2015 1205     Time In: 0930 Time Out: 1130 Time Total: 164mn Greater than 50%  of this time was spent counseling and coordinating care related to the above assessment and plan.  Signed by: PPershing Proud NP  APershing Proud NP  140/0/8676 11:18 AM  Please contact Palliative Medicine Team phone at [phone] questions and concerns.

## 2015-05-07 NOTE — Evaluation (Signed)
Physical Therapy Evaluation Patient Details Name: Edward [name] MRN: [id number] DOB: 10/24/1929 Today's Date: 05/07/2015   History of Present Illness  Patient is an 81 y/o male admitted with acute on chronic CHF.  PMH positive for CAD, CABG, PPM, AAA, A-fib, HTN, BPH, bladder ca s/p TURBT 1/16 & 11/16.  Clinical Impression  Patient presents with decreased independence with mobility due to deficits listed in PT problem list.  He will benefit from skilled PT in the acute setting to allow return home with intermittent family assist after SNF level rehab. He has been to [facility] in the past and seems eager to return there if possible.    Follow Up Recommendations SNF    Equipment Recommendations  None recommended by PT    Recommendations for Other Services       Precautions / Restrictions Precautions Precautions: Fall      Mobility  Bed Mobility Overal bed mobility: Needs Assistance Bed Mobility: Supine to Sit;Sit to Supine     Supine to sit: Mod assist Sit to supine: Supervision   General bed mobility comments: assist to lift trunk upright  Transfers Overall transfer level: Needs assistance Equipment used: Rolling walker (2 wheeled) Transfers: Sit to/from Stand Sit to Stand: Mod assist         General transfer comment: lifting assist, cues for safety with hand placement due to pt pulling up on walker  Ambulation/Gait Ambulation/Gait assistance: Min assist Ambulation Distance (Feet): 120 Feet (x 2) Assistive device: Rolling walker (2 wheeled) Gait Pattern/deviations: Step-through pattern;Decreased stride length;Narrow base of support     General Gait Details: heavy UE use on walker, SpO2 85 with amb on RA, up to 97% with seated rest x 2 minutes  Stairs            Wheelchair Mobility    Modified Rankin (Stroke Patients Only)       Balance Overall balance assessment: Needs assistance   Sitting balance-Leahy Scale: Fair     Standing balance  support: Bilateral upper extremity supported Standing balance-Leahy Scale: Poor Standing balance comment: UE support needed for balance                             Pertinent Vitals/Pain Pain Assessment: Faces Faces Pain Scale: Hurts little more Pain Location: both calves wtih ambualtion Pain Descriptors / Indicators: Tightness Pain Intervention(s): Monitored during session;Repositioned    Home Living Family/patient expects to be discharged to:: Skilled nursing facility Living Arrangements: Children Available Help at Discharge: Family Type of Home: House Home Access: Stairs to enter Entrance Stairs-Rails: Psychiatric nurse of Steps: 2 Home Layout: One level Home Equipment: Cane - single point;Shower seat - built in;Walker - 2 wheels      Prior Function Level of Independence: Independent with assistive device(s)         Comments: inconsistent history with decreased memory      Hand Dominance   Dominant Hand: Right    Extremity/Trunk Assessment               Lower Extremity Assessment: Generalized weakness      Cervical / Trunk Assessment: Kyphotic;Other exceptions  Communication   Communication: No difficulties  Cognition Arousal/Alertness: Awake/alert Behavior During Therapy: WFL for tasks assessed/performed Overall Cognitive Status: No family/caregiver present to determine baseline cognitive functioning       Memory: Decreased short-term memory              General  Comments      Exercises        Assessment/Plan    PT Assessment Patient needs continued PT services  PT Diagnosis Generalized weakness;Abnormality of gait   PT Problem List Decreased strength;Decreased activity tolerance;Decreased balance;Decreased mobility  PT Treatment Interventions DME instruction;Gait training;Functional mobility training;Therapeutic activities;Patient/family education;Balance training;Therapeutic exercise   PT Goals (Current  goals can be found in the Care Plan section) Acute Rehab PT Goals Patient Stated Goal: To go to rehab PT Goal Formulation: With patient Time For Goal Achievement: 05/21/15 Potential to Achieve Goals: Fair    Frequency Min 3X/week   Barriers to discharge        Co-evaluation               End of Session Equipment Utilized During Treatment: Gait belt Activity Tolerance: Patient limited by fatigue Patient left: in bed;with call bell/phone within reach;with bed alarm set           Time: 1343-1425 PT Time Calculation (min) (ACUTE ONLY): 42 min   Charges:   PT Evaluation $Initial PT Evaluation Tier I: 1 Procedure PT Treatments $Gait Training: 23-37 mins   PT G Codes:        [place]ath Madding,CYNDI 05-29-2015, 2:45 PM  Magda Kiel, [place] May 29, 2015

## 2015-05-07 NOTE — Progress Notes (Signed)
Subjective:  Patient lying in bed at present time.  Penile edema has improved and is less short of breath.  Very rambling historian and very difficult to get him to stay on task.  Have asked for palliative care services to see.  He also will need intensive case management as well as PT and OT to evaluate.  I'm somewhat concerned about his home conditions.  At this point medical therapy or is really our only option.  Does have less shortness of breath and does have significant improvement in edema.  Objective:  Vital Signs in the last 24 hours: BP 95/64 mmHg  Pulse 86  Temp(Src) 97.2 F (36.2 C) (Axillary)  Resp 18  Ht 5\' 10"  (1.778 m)  Wt 58.242 kg (128 lb 6.4 oz)  BMI 18.42 kg/m2  SpO2 97%  Physical Exam: Cachectic male sitting up at site of bed in no acute distress Lungs: Reduced breath sounds at the base Cardiac: Irregular rhythm rhythm, normal S1 and S2, S3 heard, 2/6 systolic murmur over aortic valve  Abdomen:  Soft, nontender, no masses Extremities:  2+ edema noted   Intake/Output from previous day: 12/07 0701 - 12/08 0700 In: 790 [P.O.:790] Out: 1150 [Urine:1150] Weight Filed Weights   05/05/15 0544 05/06/15 0159 05/07/15 0500  Weight: 62.324 kg (137 lb 6.4 oz) 56.065 kg (123 lb 9.6 oz) 58.242 kg (128 lb 6.4 oz)    Lab Results: Basic Metabolic Panel:  Recent Labs  05/06/15 0406 05/07/15 0227  NA 138 135  K 3.8 4.2  CL 98* 96*  CO2 29 31  GLUCOSE 75 118*  BUN 39* 41*  CREATININE 1.96* 2.02*    CBC:  Recent Labs  05/04/15 1205 05/07/15 0227  WBC 5.5 5.1  NEUTROABS 3.9  --   HGB 10.3* 10.5*  HCT 32.3* 33.0*  MCV 85.0 85.7  PLT 161 149*    BNP    Component Value Date/Time   BNP 2095.0* 05/04/2015 1205    PROTIME: Lab Results  Component Value Date   INR 2.83* 05/07/2015   INR 2.78* 05/06/2015   INR 2.38* 05/05/2015    Telemetry: Atrial fibrillation, with occasional PVCs.   Assessment/Plan:  1.  Acute on chronic systolic heart  failure-diuresed with edema improved and weight now gone down  2.  Chronic atrial fibrillation/flutter 3.  Long-term use of anticoagulation 4.  Stage 3-4 chronic kidney disease 5.  Coronary artery disease with previous inferior infarction previous bypass grafting 6.  Failure to thrive and protein malnutrition  Recommendations:  Awaiting palliative care involvement.  I'm going to ask for PT and OT to see patient.  He fell yesterday and concerned about safety at home.  I'm to change him to oral diuretics since his edema has improved.  Home when arrangements can be made for her safety at home.  I will need to see him in one week.  I discussed with the patient his poor prognosis and that his EF is unlikely to improve and that his current condition is a new normal for him.  Kerry Hough  MD [facility] Card[facility]  05/07/2015, 9:01 AM

## 2015-05-08 DIAGNOSIS — Z515 Encounter for palliative care: Secondary | ICD-10-CM

## 2015-05-08 DIAGNOSIS — R0600 Dyspnea, unspecified: Secondary | ICD-10-CM

## 2015-05-08 DIAGNOSIS — R634 Abnormal weight loss: Secondary | ICD-10-CM

## 2015-05-08 LAB — BASIC METABOLIC PANEL
Anion gap: 10 (ref 5–15)
BUN: 47 mg/dL — ABNORMAL HIGH (ref 6–20)
CHLORIDE: 96 mmol/L — AB (ref 101–111)
CO2: 30 mmol/L (ref 22–32)
CREATININE: 2.23 mg/dL — AB (ref 0.61–1.24)
Calcium: 8.4 mg/dL — ABNORMAL LOW (ref 8.9–10.3)
GFR, EST AFRICAN AMERICAN: 30 mL/min — AB (ref >60)
GFR, EST NON AFRICAN AMERICAN: 26 mL/min — AB (ref >60)
Glucose, Bld: 93 mg/dL (ref 65–99)
POTASSIUM: 3.7 mmol/L (ref 3.5–5.1)
SODIUM: 136 mmol/L (ref 135–145)

## 2015-05-08 LAB — PROTIME-INR
INR: 2.75 — AB (ref 0.00–1.49)
Prothrombin Time: 28.7 seconds — ABNORMAL HIGH (ref 11.6–15.2)

## 2015-05-08 MED ORDER — WARFARIN 0.5 MG HALF TABLET
0.5000 mg | ORAL_TABLET | Freq: Once | ORAL | Status: AC
  Administered 2015-05-08: 0.5 mg via ORAL
  Filled 2015-05-08: qty 1

## 2015-05-08 NOTE — Progress Notes (Signed)
Daily Progress Note   Patient Name: Edward [name]       Date: 05/08/2015 DOB: [birth year]  Age: 80 y.o. MRN#: [id number] Attending Physician: Jacolyn Reedy, MD Primary Care Physician: Gwendolyn Grant, MD Admit Date: 05/04/2015  Reason for Consultation/Follow-up: Establishing goals of care  Subjective: Edward [name] is still very lethargic and confused some in conversation. His son, Edward [name], is at bedside. We had long discussion about his failing health. Edward [name] is very much thinking that if his father's will was stronger he would be in better health. I tried to explain that Edward [name] has real medical issues that can cause his decline. I did bring up decisions that have to be made such as code status and how to care for him in further decline. Sounds like Edward [name] also wants to have his father at home. He wants to encourage him to be more active, awake, and eat well. Tried to prepare Edward [name] that even with best efforts this may not be possible. Edward [name]'s goal is to try and help his father be able to fly a kite that Edward [name] built ~25 yrs ago this spring at [employer] as he has requested. Therapeautic listening. No decisions made today.    Length of Stay: 4 days  Current Medications: Scheduled Meds:  . bisoprolol  2.5 mg Oral Daily  . feeding supplement (ENSURE ENLIVE)  237 mL Oral Q24H  . furosemide  80 mg Oral BID  . lactose free nutrition  237 mL Oral Q24H  . mometasone-formoterol  2 puff Inhalation BID  . polyethylene glycol  17 g Oral Daily  . sodium chloride  3 mL Intravenous Q12H  . tiotropium  18 mcg Inhalation Daily  . traZODone  25 mg Oral QHS  . warfarin  0.5 mg Oral ONCE-1800  . Warfarin - Pharmacist Dosing Inpatient   Does not apply q1800    Continuous Infusions:    PRN  Meds: sodium chloride, acetaminophen, nitroGLYCERIN, ondansetron (ZOFRAN) IV, sodium chloride  Physical Exam: Physical Exam  Constitutional: He appears well-developed. He appears cachectic.  Cardiovascular: Normal rate.   Pulmonary/Chest: Effort normal. No accessory muscle usage. No tachypnea. No respiratory distress.  Abdominal: Soft. Normal appearance.  Neurological: He is disoriented.                Vital  Signs: BP 86/58 mmHg  Pulse 70  Temp(Src) 97.3 F (36.3 C) (Oral)  Resp 20  Ht 5\' 10"  (1.778 m)  Wt 59.394 kg (130 lb 15 oz)  BMI 18.79 kg/m2  SpO2 96% SpO2: SpO2: 96 % O2 Device: O2 Device: Not Delivered O2 Flow Rate: O2 Flow Rate (L/min): 0 L/min  Intake/output summary:  Intake/Output Summary (Last 24 hours) at 05/08/15 1548 Last data filed at 05/08/15 1335  Gross per 24 hour  Intake    957 ml  Output    685 ml  Net    272 ml   LBM: Last BM Date: 05/07/15 Baseline Weight: Weight: 64.184 kg (141 lb 8 oz) Most recent weight: Weight: 59.394 kg (130 lb 15 oz)       Palliative Assessment/Data: Flowsheet Rows        Most Recent Value   Intake Tab    Referral Department  Cardiology   Unit at Time of Referral  Cardiac/Telemetry Unit   Palliative Care Primary Diagnosis  Cardiac   Date Notified  05/06/15   Palliative Care Type  New Palliative care   Reason for referral  Clarify Goals of Care   Date of Admission  05/04/15   Date first seen by Palliative Care  05/07/15   # of days Palliative referral response time  1 Day(s)   # of days IP prior to Palliative referral  2   Clinical Assessment    Psychosocial & Spiritual Assessment    Palliative Care Outcomes       Additional Data Reviewed: CBC    Component Value Date/Time   WBC 5.1 05/07/2015 0227   WBC 8.7 07/07/2014   RBC 3.85* 05/07/2015 0227   HGB 10.5* 05/07/2015 0227   HCT 33.0* 05/07/2015 0227   PLT 149* 05/07/2015 0227   MCV 85.7 05/07/2015 0227   MCH 27.3 05/07/2015 0227   MCHC 31.8 05/07/2015  0227   RDW 17.8* 05/07/2015 0227   LYMPHSABS 0.7 05/04/2015 1205   MONOABS 0.8 05/04/2015 1205   EOSABS 0.1 05/04/2015 1205   BASOSABS 0.0 05/04/2015 1205    CMP     Component Value Date/Time   NA 136 05/08/2015 0353   NA 136* 07/01/2014   K 3.7 05/08/2015 0353   CL 96* 05/08/2015 0353   CO2 30 05/08/2015 0353   GLUCOSE 93 05/08/2015 0353   BUN 47* 05/08/2015 0353   BUN 22* 07/01/2014   CREATININE 2.23* 05/08/2015 0353   CREATININE 1.4* 07/01/2014   CALCIUM 8.4* 05/08/2015 0353   PROT 6.9 05/04/2015 1205   ALBUMIN 2.4* 05/04/2015 1205   AST 34 05/04/2015 1205   ALT 20 05/04/2015 1205   ALKPHOS 145* 05/04/2015 1205   BILITOT 1.7* 05/04/2015 1205   GFRNONAA 26* 05/08/2015 0353   GFRAA 30* 05/08/2015 0353       Problem List:  Patient Active Problem List   Diagnosis Date Noted  . Weight loss   . Palliative care encounter 05/07/2015  . Protein-calorie malnutrition, severe 05/05/2015  . Tachycardia-bradycardia ([place])   . Acute on chronic systolic (congestive) heart failure ([place]) 05/04/2015  . Chronic combined systolic and diastolic heart failure ([place])   . Elevated serum free T4 level 12/14/2014  . Cancer of bladder wall ([place]) 06/16/2014  . Atrial flutter ([place]) 12/30/2013  . Atrial fibrillation ([place]) 03/28/2013  . Long-term (current) use of anticoagulants 03/28/2013  . Atherosclerosis of native arteries of the extremities with intermittent claudication 10/30/2012  . BPH (benign prostatic hypertrophy)   .  Insomnia   . Chronic diastolic heart failure ([place]) 03/30/2009  . History of carotid endarterectomy 03/30/2009  . Gout   . CAD (coronary artery disease), native coronary artery   . LBBB   . Chronic kidney disease stage III (GFR 30-59 ml/min)   . Hyperlipidemia   . Hypertensive heart disease   . COPD GOLD II/III 05/05/2007     Palliative Care Assessment & Plan    1.Code Status:  Full code    Code Status Orders        Start     Ordered   05/04/15 1131   Full code   Continuous     05/04/15 1130    Advance Directive Documentation        Most Recent Value   Type of Advance Directive  Living will [dt is poa/not hcpoa. kathy nance-]   Pre-existing out of facility DNR order (yellow form or pink MOST form)     "MOST" Form in Place?         2. Goals of Care/Additional Recommendations:  Goals are unclear at this point. Edward [name] is confused and sons seem to be on different pages. Attempted to meet with them all together yesterday but unsuccessfully.   Limitations on Scope of Treatment: Full Scope Treatment  Desire for further Chaplaincy support:no  Psycho-social Needs: Caregiving  Support/Resources and Education on Hospice  3. Symptom Management:  Insomnia: Continue trial trazodone 25 mg qhs. Consider increasing if needed and tolerating.   Decreased appetite: Encourage foods he likes. Encourage supplements.   Weakness: PT to see.  4. Palliative Prophylaxis:   Bowel Regimen and Delirium Protocol  5. Prognosis: < 6 months  6. Discharge Planning:  To be determined.    Thank you for allowing the Palliative Medicine Team to assist in the care of this patient.   Time In: 1500 Time Out: 1550 Total Time 56min Prolonged Time Billed  no         Pershing Proud, NP  [id number], 3:48 PM  Please contact Palliative Medicine Team phone at [phone] for questions and concerns.

## 2015-05-08 NOTE — Progress Notes (Signed)
ANTICOAGULATION CONSULT NOTE - Follow Up Consult  Pharmacy Consult for warfarin Indication: atrial fibrillation  Allergies  Allergen Reactions  . Ace Inhibitors Cough  . Clarithromycin Other (See Comments)    Strange thoughts and fell with biaxin  . Codeine Nausea And Vomiting  . Oxycodone-Acetaminophen Other (See Comments)    Felt closed in  . Viberzi [Eluxadoline] Other (See Comments)    Severe cramps and impaction    Patient Measurements: Height: 5\' 10"  (177.8 cm) Weight: 130 lb 15 oz (59.394 kg) IBW/kg (Calculated) : 73  Vital Signs: Temp: 97.3 F (36.3 C) (12/09 0500) Temp Source: Oral (12/09 0500) BP: 86/58 mmHg (12/09 0500) Pulse Rate: 70 (12/09 0500)  Labs:  Recent Labs  05/06/15 0406 05/07/15 0227 05/08/15 0353  HGB  --  10.5*  --   HCT  --  33.0*  --   PLT  --  149*  --   LABPROT 28.9* 29.3* 28.7*  INR 2.78* 2.83* 2.75*  CREATININE 1.96* 2.02* 2.23*    Estimated Creatinine Clearance: 21.1 mL/min (by C-G formula based on Cr of 2.23).  Assessment: 81 y/o male with CHF, bladder cancer, and FTT admitted for inpatient diuresis. He takes chronic warfarin for Afib. INR is therapeutic at 2.75. No bleeding noted however patient fell on 12/7 and hit head on the wall. Hgb remains low at 10.5 but stable. Plts slightly down at 149. No overt bleeding noted.          PTA: 1 mg daily, last dose 12/4  Goal of Therapy:  INR 2-3 Monitor platelets by anticoagulation protocol: Yes   Plan:  - Warfarin 0.5 mg PO tonight - INR daily, CBC q72h - Monitor for s/sx of bleeding  Governor Specking, PharmD Clinical Pharmacy Resident Pager: [phone]  05/08/2015 1:09 PM

## 2015-05-08 NOTE — Evaluation (Addendum)
Occupational Therapy Evaluation Patient Details Name: Edward [name] MRN: [id number] DOB: Jul 29, 1929 Today's Date: 05/08/2015    History of Present Illness Patient is an 81 y/o male admitted with acute on chronic CHF.  PMH positive for CAD, CABG, PPM, AAA, A-fib, HTN, BPH, bladder ca s/p TURBT 1/16 & 11/16.   Clinical Impression   PT admitted with Chronic CHF with x1 falls. Pt currently with functional limitiations due to the deficits listed below (see OT problem list). PTA living at home alone with family (A) PRN. Pt will benefit from skilled OT to increase their independence and safety with adls and balance to allow discharge SNF. Pt disoriented this session and unable to verbalize person in the room is his son. Pt reports "grandson"  **of note- concern for coughing with thin liquids this session. Coughing with coffee with weak voice quality after "choking"    Follow Up Recommendations  SNF;Supervision/Assistance - 24 hour    Equipment Recommendations       Recommendations for Other Services Other (comment) (palliative care) ( SLP- choking with drinking coffee)     Precautions / Restrictions Precautions Precautions: Fall Restrictions Weight Bearing Restrictions: No      Mobility Bed Mobility Overal bed mobility: Needs Assistance Bed Mobility: Supine to Sit     Supine to sit: Min guard;HOB elevated     General bed mobility comments: pt with heavy use of bed rails and HOB elevated to progress to EOB. Pt needed increase time  Transfers Overall transfer level: Needs assistance Equipment used: 1 person hand held assist Transfers: Sit to/from Stand Sit to Stand: Mod assist         General transfer comment: cues for safety and increase time    Balance Overall balance assessment: Needs assistance         Standing balance support: Bilateral upper extremity supported;During functional activity Standing balance-Leahy Scale: Poor                               ADL Overall ADL's : Needs assistance/impaired Eating/Feeding: Set up;Bed level Eating/Feeding Details (indicate cue type and reason): pt noted to cough with coffee drinking. Requesting SLP due to coughing and spillage with eating.  Grooming: Wash/dry face;Oral care;Set up;Sitting (shaving with electric shaver) Grooming Details (indicate cue type and reason): pt using L UE to help R Ue.                  Toilet Transfer: Maximal assistance;Ambulation;Regular Toilet;Grab bars Toilet Transfer Details (indicate cue type and reason): pt requires (A) to lower to the toilet and sit<>stand.          Functional mobility during ADLs: Moderate assistance;Cueing for sequencing General ADL Comments: Pt reaching for environmental supports and needed cues for hand held (A) from therapist     Vision     Perception     Praxis      Pertinent Vitals/Pain Pain Assessment: No/denies pain     Hand Dominance Right   Extremity/Trunk Assessment Upper Extremity Assessment Upper Extremity Assessment: RUE deficits/detail RUE Deficits / Details: AROM 60 degrees observed with don of shirt. Pt using L hand during session to assist R UE   Lower Extremity Assessment Lower Extremity Assessment: Generalized weakness   Cervical / Trunk Assessment Cervical / Trunk Assessment: Kyphotic Cervical / Trunk Exceptions: cachexic - noted   Communication Communication Communication: Other (comment) (soft voice quality)   Cognition Arousal/Alertness: Awake/alert Behavior During Therapy:  Flat affect Overall Cognitive Status: Impaired/Different from baseline Area of Impairment: Attention;Memory;Following commands;Safety/judgement;Awareness   Current Attention Level: Sustained Memory: Decreased recall of precautions;Decreased short-term memory Following Commands: Follows one step commands with increased time Safety/Judgement: Decreased awareness of safety;Decreased awareness of deficits Awareness:  Intellectual   General Comments: [place] Pt educated on location and current location in [place]. pt makes a face in response to education of "surprise" . Pt very matter of fact about session and rambling on about leaving hospital but often speech is difficult to understand. Son present and expressed concern because of cognitive changes.    General Comments       Exercises       Shoulder Instructions      Home Living Family/patient expects to be discharged to:: Skilled nursing facility Living Arrangements: Children Available Help at Discharge: Family Type of Home: House Home Access: Stairs to enter [employer] of Steps: 2 Entrance Stairs-Rails: Right;Left Home Layout: One level               Home Equipment: [place] - single point;Shower seat - built in;Walker - 2 wheels          Prior Functioning/Environment Level of Independence: Independent with assistive device(s)             OT Diagnosis: Generalized weakness;Cognitive deficits   OT Problem List: Decreased strength;Decreased activity tolerance;Impaired balance (sitting and/or standing);Decreased cognition;Decreased safety awareness;Decreased knowledge of use of DME or AE;Decreased knowledge of precautions;Cardiopulmonary status limiting activity   OT Treatment/Interventions: Self-care/ADL training;Therapeutic exercise;Energy conservation;DME and/or AE instruction;Therapeutic activities;Cognitive remediation/compensation;Patient/family education;Balance training    OT Goals(Current goals can be found in the care plan section) Acute Rehab OT Goals Patient Stated Goal: to get out of here today OT Goal Formulation: With patient/family Time For Goal Achievement: 05/22/15 Potential to Achieve Goals: Fair  OT Frequency: Min 2X/week   Barriers to D/C: Decreased caregiver support  per notes son requesting to take patient home if possible. Pt with x1 fall and  cognitive deficits. OT recommending SNF       Co-evaluation              End of Session Equipment Utilized During Treatment: Gait belt Nurse Communication: Mobility status;Precautions  Activity Tolerance: Patient tolerated treatment well Patient left: in chair;with call bell/phone within reach;with chair alarm set;with family/visitor present   Time: [id number] OT Time Calculation (min): 43 min Charges:  OT General Charges $OT Visit: 1 Procedure OT Evaluation $Initial OT Evaluation Tier I: 1 Procedure OT Treatments $Self Care/Home Management : 23-37 mins G-Codes:    Peri Maris 2015-06-07, 9:36 AM  Pager: [phone]

## 2015-05-08 NOTE — Progress Notes (Signed)
Patient Name: Edward [name] Date of Encounter: 05/08/2015  Active Problems:   Acute on chronic systolic (congestive) heart failure (HCC)   Chronic combined systolic and diastolic heart failure (H[facility])   Protein-calorie malnutrition, severe   Tachycardia-bradycardia [facility])   Palliative care encounter   Primary Cardiologist: Dr. 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  SUBJECTIVE: Denies any chest pain, palpitations, or shortness of breath. Says he is comfortable.  OBJECTIVE Filed Vitals:   05/07/15 2025 05/07/15 2042 05/08/15 0500 05/08/15 0732  BP:  85/50 86/58   Pulse:  70 70   Temp:  97.7 F (36.5 C) 97.3 F (36.3 C)   TempSrc:  Oral Oral   Resp:  20 20   Height:      Weight:   130 lb 15 oz (59.394 kg)   SpO2: 91% 95% 96% 96%    Intake/Output Summary (Last 24 hours) at 05/08/15 0947 Last data filed at 05/08/15 [id number]  Gross per 24 hour  Intake   1437 ml  Output    485 ml  Net    952 ml   Filed Weights   05/06/15 0159 05/07/15 0500 05/08/15 0500  Weight: 123 lb 9.6 oz (56.065 kg) 128 lb 6.4 oz (58.242 kg) 130 lb 15 oz (59.394 kg)    PHYSICAL EXAM General: Frail appearing Caucasian male in no acute distress. Head: Normocephalic, atraumatic.  Neck: Supple without bruits, JVD not elevated. Lungs:  Resp regular and unlabored, CTA without wheezing or rales. Heart: Irregularly irregular, S1, S2, no S3, S4, 2/6 SEM at RUSB; no rub. Abdomen: Soft, non-tender, non-distended with normoactive bowel sounds. No hepatomegaly. No rebound/guarding. No obvious abdominal masses. Extremities: No clubbing, cyanosis, or edema. Distal pedal pulses are 2+ bilaterally. Neuro: Alert  and oriented X 3. Moves all extremities spontaneously. Psych: Normal affect.  LABS: CBC: Recent Labs  05/07/15 0227  WBC 5.1  HGB 10.5*  HCT 33.0*  MCV 85.7  PLT 149*   INR: Recent Labs  05/08/15 0353  INR [id number]*   Basic Metabolic Panel: Recent Labs  05/07/15 0227 05/08/15 0353  NA 135 136  K 4.2 3.7  CL 96* 96*  CO2 31 30  GLUCOSE 118* 93  BUN 41* 47*  CREATININE 2.02* 2.23*  CALCIUM 8.4* 8.4*   Liver Function Tests:No results for input(s): AST, ALT, ALKPHOS, BILITOT, PROT, ALBUMIN in the last 72 hours. Cardiac Enzymes:No results for input(s): CKTOTAL, CKMB, CKMBINDEX, TROPONINI in the last 72 hours. No results for input(s): TROPIPOC in the last 72 hours. BNP:  B NATRIURETIC PEPTIDE  Date/Time Value Ref Range Status  05/04/2015 12:05 PM 2095.0* 0.0 - 100.0 pg/mL Final  06/24/2014 02:45 PM 1232.5* 0.0 - 100.0 pg/mL Final   TELE:  Atrial fibrillation with rate in 60's - 70's. No atopic events.     ECHO: Study Conclusions - Left ventricle: The cavity size was normal. Wall thickness was increased in a pattern of mild LVH. The estimated ejection fraction was 15%. Diffuse hypokinesis. Features are consistent with a pseudonormal left ventricular filling pattern, with concomitant abnormal relaxation and increased filling pressure (grade 2 diastolic dysfunction). - Aortic valve: Trileaflet; moderately calcified leaflets. Sclerosis without stenosis. There  was trivial regurgitation. - Mitral valve: Moderately calcified annulus. Mildly calcified leaflets . There was mild regurgitation. - Left atrium: The atrium was moderately dilated. - Right ventricle: The cavity size was normal. Pacer wire or catheter noted in right ventricle. Systolic function was moderately reduced. - Right atrium: The atrium was mildly dilated. - Tricuspid valve: There was moderate regurgitation. Peak RV-RA gradient (S): 46 mm Hg. - Pulmonary arteries: PA peak pressure: 54  mm Hg (S). - Pericardium, extracardiac: IVC measured 1.8 cm with < 50% respirophasic variation, suggesting RA pressure 8 mmHg.  Impressions: - Normal LV size with mild LV hypertrophy. EF 15%, diffuse hypokinesis. Moderate diastolic dysfunction. Aortic sclerosis without significant stenosis. Mild mitral regurgitation. Normal RV size with moderately decreased systolic function. Moderate pulmonary hypertension. Moderate TR.    Current Medications:  . bisoprolol  2.5 mg Oral Daily  . feeding supplement (ENSURE ENLIVE)  237 mL Oral Q24H  . furosemide  80 mg Oral BID  . lactose free nutrition  237 mL Oral Q24H  . mometasone-formoterol  2 puff Inhalation BID  . polyethylene glycol  17 g Oral Daily  . sodium chloride  3 mL Intravenous Q12H  . tiotropium  18 mcg Inhalation Daily  . traZODone  25 mg Oral QHS  . Warfarin - Pharmacist Dosing Inpatient   Does not apply q1800      ASSESSMENT AND PLAN:  1. Acute on chronic systolic heart failure - EF 40-45 % in 05/2014. Decreased to 15% this admission with diffuse hypokinesis.  - Palliate Care has been seeing the patient. PT and OT recommend SNF but family wishes to take the patient home. Unfortunately, no family is present at the time of this encounter today. - continue low-dose BB. Currently on Lasix 80mg  PO BID.  2. Chronic atrial fibrillation/flutter - Coumadin for anticoagulation.  - low-dose BB for rate control.  3. Stage 3-4 chronic kidney disease - creatinine elevated to 2.23 on 05/08/2015. - baseline 1.8 - 1.9.  4. Coronary artery disease with previous inferior infarction previous bypass grafting - s/p CABG in 1999 w/ LIMA-LAD, SVG-dx, SVG-OM, SVG-RCA, redo CABG in 2000 w/ SVG-OM and SVG-RCA. - continue BB  5. Heart Block - s/p MDT Pacemaker in 2014 - seen by EP this admission who changed his VVIR from 70bpm to VVI 50 bpm on his device.  6. Hypotension - BP has been 85/50 - 91/64 in the past 24 hours. -  cannot further titrate medications at this time.  Signed, Erma Heritage , PA-C 9:47 AM 05/08/2015 Pager: [phone]

## 2015-05-08 NOTE — [place] FL2 (Signed)
[place] MEDICAID FL2 LEVEL OF CARE SCREENING TOOL     IDENTIFICATION  Patient Name: Edward [name] Birthdate: [birth year] Sex: male Admission Date (Current Location): 05/04/2015  [facility] and [place] Number:     Facility and Address:  [facility] [place], [place] [address] [place], [place] [address]      Provider Number: [id number]  Attending Physician Name and Address:  Jacolyn Reedy, MD  Relative Name and Phone Number:       Current Level of Care: Hospital Recommended Level of Care: [place] Prior Approval Number:    Date Approved/Denied:   PASRR Number: [id number]  Discharge Plan: SNF    Current Diagnoses: Patient Active Problem List   Diagnosis Date Noted  . Weight loss   . Palliative care encounter 05/07/2015  . Protein-calorie malnutrition, severe 05/05/2015  . Tachycardia-bradycardia ([place])   . Acute on chronic systolic (congestive) heart failure ([place]) 05/04/2015  . Chronic combined systolic and diastolic heart failure ([place])   . Elevated serum free T4 level 12/14/2014  . Cancer of bladder wall ([place]) 06/16/2014  . Atrial flutter ([place]) 12/30/2013  . Atrial fibrillation ([place]) 03/28/2013  . Long-term (current) use of anticoagulants 03/28/2013  . Atherosclerosis of native arteries of the extremities with intermittent claudication 10/30/2012  . BPH (benign prostatic hypertrophy)   . Insomnia   . Chronic diastolic heart failure ([place]) 03/30/2009  . History of carotid endarterectomy 03/30/2009  . Gout   . CAD (coronary artery disease), native coronary artery   . LBBB   . Chronic kidney disease stage III (GFR 30-59 ml/min)   . Hyperlipidemia   . Hypertensive heart disease   . COPD GOLD II/III 05/05/2007    Orientation RESPIRATION BLADDER Height & Weight    Self  Normal Incontinent 5\' 10"  (177.8 cm) 130 lbs.  BEHAVIORAL SYMPTOMS/MOOD NEUROLOGICAL BOWEL NUTRITION STATUS  Other (Comment) (n/a)  (n/a) Continent Diet (Please  see discharge summary.)  AMBULATORY STATUS COMMUNICATION OF NEEDS Skin   Limited Assist Verbally Normal                       Personal Care Assistance Level of Assistance  Bathing, Feeding, Dressing Bathing Assistance: Maximum assistance Feeding assistance: Independent Dressing Assistance: Limited assistance     Functional Limitations Info   (n/a)          [place]  PT (By licensed PT), OT (By licensed OT)     PT Frequency: 5 OT Frequency: 5            Contractures      Additional Factors Info  Code Status, Allergies Code Status Info: FULL Allergies Info: Viberzi Eluxadoline, Ace Inhibitors, Clarithromycin, Codeine, Oxycodone-acetaminophen           Current Medications (05/08/2015):  This is the current hospital active medication list Current Facility-Administered Medications  Medication Dose Route Frequency Provider Last Rate Last Dose  . 0.9 %  sodium chloride infusion  250 mL Intravenous PRN Jacolyn Reedy, MD      . acetaminophen (TYLENOL) tablet 650 mg  650 mg Oral Q4H PRN Jacolyn Reedy, MD      . bisoprolol (ZEBETA) tablet 2.5 mg  2.5 mg Oral Daily Jacolyn Reedy, MD   2.5 mg at 05/08/15 0959  . feeding supplement (ENSURE ENLIVE) (ENSURE ENLIVE) liquid 237 mL  237 mL Oral Q24H Reanne J Barbato, RD   237 mL at 05/08/15 1004  . furosemide (  LASIX) tablet 80 mg  80 mg Oral BID Jacolyn Reedy, MD   80 mg at 05/08/15 0959  . lactose free nutrition (BOOST PLUS) liquid 237 mL  237 mL Oral Q24H Reanne J Barbato, RD   237 mL at 05/06/15 1400  . mometasone-formoterol (DULERA) 100-5 MCG/ACT inhaler 2 puff  2 puff Inhalation BID Jacolyn Reedy, MD   2 puff at 05/08/15 [phone]  . nitroGLYCERIN (NITROSTAT) SL tablet 0.4 mg  0.4 mg Sublingual Q5 min PRN Jacolyn Reedy, MD      . ondansetron [facility]) injection 4 mg  4 mg Intravenous Q6H PRN Jacolyn Reedy, MD      . polyethylene glycol (MIRALAX / GLYCOLAX) packet 17 g  17 g Oral Daily  Jacolyn Reedy, MD   17 g at 05/08/15 1000  . sodium chloride 0.9 % injection 3 mL  3 mL Intravenous Q12H Jacolyn Reedy, MD   3 mL at 05/08/15 1004  . sodium chloride 0.9 % injection 3 mL  3 mL Intravenous PRN Jacolyn Reedy, MD      . tiotropium [facility]) inhalation capsule 18 mcg  18 mcg Inhalation Daily Jacolyn Reedy, MD   18 mcg at 05/08/15 0855  . traZODone (DESYREL) tablet 25 mg  25 mg Oral QHS Pershing Proud, NP   25 mg at 05/07/15 2100  . warfarin (COUMADIN) tablet 0.5 mg  0.5 mg Oral ONCE-1800 Meagan A Decker, RPH      . Warfarin - Pharmacist Dosing Inpatient   Does not apply [place], [facility]         Discharge Medications: Please see discharge summary for a list of discharge medications.  Relevant Imaging Results:  Relevant Lab Results:   Additional Information Social Security #: [id number]  Luna Kitchens [phone]

## 2015-05-09 LAB — BASIC METABOLIC PANEL
ANION GAP: 8 (ref 5–15)
BUN: 50 mg/dL — ABNORMAL HIGH (ref 6–20)
CALCIUM: 8.4 mg/dL — AB (ref 8.9–10.3)
CO2: 31 mmol/L (ref 22–32)
Chloride: 98 mmol/L — ABNORMAL LOW (ref 101–111)
Creatinine, Ser: 2.37 mg/dL — ABNORMAL HIGH (ref 0.61–1.24)
GFR, EST AFRICAN AMERICAN: 28 mL/min — AB (ref >60)
GFR, EST NON AFRICAN AMERICAN: 24 mL/min — AB (ref >60)
Glucose, Bld: 74 mg/dL (ref 65–99)
POTASSIUM: 3.9 mmol/L (ref 3.5–5.1)
Sodium: 137 mmol/L (ref 135–145)

## 2015-05-09 LAB — URINALYSIS, ROUTINE W REFLEX MICROSCOPIC
Bilirubin Urine: NEGATIVE
Glucose, UA: NEGATIVE mg/dL
KETONES UR: NEGATIVE mg/dL
NITRITE: NEGATIVE
PROTEIN: NEGATIVE mg/dL
Specific Gravity, Urine: 1.01 (ref 1.005–1.030)
pH: 7.5 (ref 5.0–8.0)

## 2015-05-09 LAB — PROTIME-INR
INR: 2.75 — AB (ref 0.00–1.49)
PROTHROMBIN TIME: 28.6 s — AB (ref 11.6–15.2)

## 2015-05-09 LAB — URINE MICROSCOPIC-ADD ON

## 2015-05-09 MED ORDER — WARFARIN 0.5 MG HALF TABLET
0.5000 mg | ORAL_TABLET | Freq: Once | ORAL | Status: AC
  Administered 2015-05-09: 0.5 mg via ORAL
  Filled 2015-05-09: qty 1

## 2015-05-09 NOTE — Clinical Social Work Placement (Signed)
   CLINICAL SOCIAL WORK PLACEMENT  NOTE  Date:  05/08/2015  Patient Details  Name: Edward [name] MRN: [id number] Date of Birth: 09-16-1929  Clinical Social Work is seeking post-discharge placement for this patient at the [place] level of care (*CSW will initial, date and re-position this form in  chart as items are completed):  Yes   Patient/family provided with [place] Work Department's list of facilities offering this level of care within the geographic area requested by the patient (or if unable, by the patient's family).  Yes   Patient/family informed of their freedom to choose among providers that offer the needed level of care, that participate in Medicare, Medicaid or managed care program needed by the patient, have an available bed and are willing to accept the patient.  Yes   Patient/family informed of [facility]'s ownership interest in [facility] and [facility], as well as of the fact that they are under no obligation to receive care at these facilities.  PASRR submitted to EDS on       PASRR number received on       Existing PASRR number confirmed on 05/08/15     FL2 transmitted to all facilities in geographic area requested by pt/family on 05/08/15     FL2 transmitted to all facilities within larger geographic area on       Patient informed that his/her managed care company has contracts with or will negotiate with certain facilities, including the following:            Patient/family informed of bed offers received.  Patient chooses bed at       Physician recommends and patient chooses bed at      Patient to be transferred to   on  .  Patient to be transferred to facility by       Patient family notified on   of transfer.  Name of family member notified:        PHYSICIAN Please prepare priority discharge summary, including medications, Please sign FL2, Please prepare prescriptions     Additional Comment:     _______________________________________________ Williemae Area, LCSW 05/08/2015. 5:10 PM

## 2015-05-09 NOTE — Clinical Social Work Note (Signed)
Clinical Social Work Assessment  Patient Details  Name: Edward [name] MRN: [id number] Date of Birth: 1930/05/07  Date of referral:  05/08/15               Reason for consult:  Facility Placement                Permission sought to share information with:  Family Supports, Chartered certified accountant granted to share information::  Yes, Hospital doctor (Permission given by son as patient is oriented to person only)  Name::     Edward [name] SNF's but prefers [place];  2 sons and 1 daughter   Housing/Transportation Living arrangements for the past 2 months:  Single Family Home Source of Information:  Adult Children (Son:  Educational psychologist) Patient Interpreter Needed:  None Criminal Activity/Legal Involvement Pertinent to Current Situation/Hospitalization:  No - Comment as needed Significant Relationships:  Adult Children Lives with:  Adult Children Do you feel safe going back to the place where you live?   Yes Need for family participation in patient care:  Yes (Comment)  Care giving concerns:  Recent fall at home; change in mentation recently which is very concerning to family.  2 sons have been attempting to care for patient at home.    Social Worker assessment / plan:  81 year old male admitted from home- referred to [place] for SNF placement for rehab. Very pleasant gentleman noted to be lying in bed. He is currently alert to person only- however was able to state that he wanted his children involved in his care and decision making.  CSW was unable to develop a d/c plan with him due to his limited orientation.  CSW spoke with son Edward [name] via phone for an extended period of time to discuss possible options for care.  Son verbalized feelings of frustration that the family is being given varied options of care- from Hospice end of life care to SNF rehab and they feel confused and unsure what to do. He states that his father has always made his own decisions and is a very strong  willed man; being placed in a position to have to be making care decisions for him is very unnerving and the family wants him included as much as possible.  CSW acknowledged their feelings of confusion and attempted to explain differences in each level of care as well as insurance relationships with Hospice, Home health and SNF.  Patient has received short term rehab in the past at [facility] and was able to return home so son was able to understand this option best.  He agreed for CSW to send SNF search out to [facility] but remains adamant that they may choose to take him home.  They are unsure whether Hospice or Home Health would be best option.  Son is concerned that hospice would mean giving him morphine or other drugs to "knock him out" until end of life. Discussed actual purpose and benefits of Hospice and that his perception of what this service does is different from their actual purpose.  He was very appreciative of this information. Son is also concerned that the family wants their father to make his own health care decisions if possible. He is unsure if there is a health care POA; he states his sister is financial POA.  CSW encouraged son and his siblings to review the document to determine if Health care POA is included for her.  While involving the patient is very  important- CSW discussed with son that at this time- he appears to only be oriented to person and thus may not be able to fully understand or retain information that is being given to him.  Son acknowledged this but stated this is very hard for the children to accept.  He promised to talk with his siblings over the weekend and also wants to discuss further with MD and with Palliative care team.  SNF option is also open if they wish to pursue this further.  Fl2 placed on chart for MD's signature.   Employment status:  Retired Forensic scientist:  [employer] PT Recommendations:  [place] / Referral to  community resources:  [place] (SNF vs Home with [facility] vs Home with Hospice)  Patient/Family's Response to care: Son states that he feels his father is getting good care but he does not feel that the family is being given a clear understanding from staff of the health outcome expectations for patient.    Patient/Family's Understanding of and Emotional Response to Diagnosis, Current Treatment, and Prognosis: Son states that he has a better understanding now of patient's medical condition and current treatment but needs further instruction re: prognosis.  Son noted to be emotional at times as he discussed his father's failing health and recent decrease in mentation.  Son and siblings are grieving for their father's deterioration and are unsure as to what next step in his care should be.  Patient is currently a full code.  CSW left message for MD in hopes that further discussions can be held with family.  Emotional Assessment Appearance:  Appears stated age Attitude/Demeanor/Rapport:   (Quiet, cooperative, responsive) Affect (typically observed):  Pleasant, Quiet, Calm Orientation:  Oriented to Self Alcohol / Substance use:  Tobacco Use, Alcohol Use (Former smoker) Psych involvement (Current and /or in the community):  No (Comment)  Discharge Needs  Concerns to be addressed:  Decision making concerns, Discharge Planning Concerns (? Hospice needs? alert to person only, ? HCPOA) Readmission within the last 30 days:  Yes Current discharge risk:  Cognitively Impaired Barriers to Discharge:  Continued Medical Work up (Family unsure of appropraite d/c plan)   Edward Area, LCSW 05/09/2015, 12:08 PM

## 2015-05-09 NOTE — Progress Notes (Signed)
ANTICOAGULATION CONSULT NOTE - Follow Up Consult  Pharmacy Consult for warfarin Indication: atrial fibrillation  Allergies  Allergen Reactions  . Ace Inhibitors Cough  . Clarithromycin Other (See Comments)    Strange thoughts and fell with biaxin  . Codeine Nausea And Vomiting  . Oxycodone-Acetaminophen Other (See Comments)    Felt closed in  . Viberzi [Eluxadoline] Other (See Comments)    Severe cramps and impaction    Patient Measurements: Height: 5\' 10"  (177.8 cm) Weight: 130 lb 14.2 oz (59.37 kg) IBW/kg (Calculated) : 73  Vital Signs: Temp: 97.8 F (36.6 C) (12/10 1159) Temp Source: Oral (12/10 1159) BP: 85/51 mmHg (12/10 1159) Pulse Rate: 64 (12/10 1159)  Labs:  Recent Labs  05/07/15 0227 05/08/15 0353 05/09/15 0319  HGB 10.5*  --   --   HCT 33.0*  --   --   PLT 149*  --   --   LABPROT 29.3* 28.7* 28.6*  INR 2.83* 2.75* 2.75*  CREATININE 2.02* 2.23* 2.37*    Estimated Creatinine Clearance: 19.8 mL/min (by C-G formula based on Cr of 2.37).  Assessment: 81 y/o male with CHF, bladder cancer, and FTT admitted for inpatient diuresis. Warfarin 1mg  daily PTA for Afib. INR on admit was therapeutic at 2.38. Trended up a little and has been getting 0.5mg  the last 3 days with INR therapeutic at 2.75. Hb low-stable 10.5, plt down 149; fell in room 12/7, hit head on wall, no new orders. No s/s of bleed.  Goal of Therapy:  INR 2-3 Monitor platelets by anticoagulation protocol: Yes   Plan:  Give warfarin 0.5mg  PO x 1 Monitor daily INR, CBC, s/s of bleed  Elenor Quinones, PharmD, BCPS Clinical Pharmacist Pager [phone] 05/09/2015 1:00 PM

## 2015-05-09 NOTE — Progress Notes (Signed)
Patient ID: SHELDRICK CICHOWSKI, male   DOB: 1930/03/02, 81 y.o.   MRN: [id number]     Subjective:    SOB improved.   Objective:   Temp:  [97.3 F (36.3 C)-97.6 F (36.4 C)] 97.6 F (36.4 C) (12/10 0457) Pulse Rate:  [53-62] 62 (12/10 0457) Resp:  [18-20] 18 (12/10 0457) BP: (90-99)/(57-65) 94/58 mmHg (12/10 0457) SpO2:  [90 %-100 %] 90 % (12/10 0922) Weight:  [130 lb 14.2 oz (59.37 kg)] 130 lb 14.2 oz (59.37 kg) (12/10 0457) Last BM Date: 05/09/15  Filed Weights   05/07/15 0500 05/08/15 0500 05/09/15 0457  Weight: 128 lb 6.4 oz (58.242 kg) 130 lb 15 oz (59.394 kg) 130 lb 14.2 oz (59.37 kg)    Intake/Output Summary (Last 24 hours) at 05/09/15 1202 Last data filed at 05/09/15 1030  Gross per 24 hour  Intake    120 ml  Output    127 ml  Net     -7 ml     Exam:  General: NAD  Resp: CTAB  Cardiac: RRR, no m/r/g, no jvd  GI: abdomen soft, NT, ND  MSK:no LE edema  Neuro: no focal deficits  Psych: appropriate affect  Lab Results:  Basic Metabolic Panel:  Recent Labs Lab 05/07/15 0227 05/08/15 0353 05/09/15 0319  NA 135 136 137  K 4.2 3.7 3.9  CL 96* 96* 98*  CO2 31 30 31   GLUCOSE 118* 93 74  BUN 41* 47* 50*  CREATININE 2.02* 2.23* 2.37*  CALCIUM 8.4* 8.4* 8.4*    Liver Function Tests:  Recent Labs Lab 05/04/15 1205  AST 34  ALT 20  ALKPHOS 145*  BILITOT 1.7*  PROT 6.9  ALBUMIN 2.4*    CBC:  Recent Labs Lab 05/04/15 1205 05/07/15 0227  WBC 5.5 5.1  HGB 10.3* 10.5*  HCT 32.3* 33.0*  MCV 85.0 85.7  PLT 161 149*    Cardiac Enzymes: No results for input(s): CKTOTAL, CKMB, CKMBINDEX, TROPONINI in the last 168 hours.  BNP: No results for input(s): PROBNP in the last 8760 hours.  Coagulation:  Recent Labs Lab 05/07/15 0227 05/08/15 0353 05/09/15 0319  INR 2.83* 2.75* 2.75*    ECG:   Medications:   Scheduled Medications: . bisoprolol  2.5 mg Oral Daily  . feeding supplement (ENSURE ENLIVE)  237 mL Oral Q24H  . furosemide   80 mg Oral BID  . lactose free nutrition  237 mL Oral Q24H  . mometasone-formoterol  2 puff Inhalation BID  . polyethylene glycol  17 g Oral Daily  . sodium chloride  3 mL Intravenous Q12H  . tiotropium  18 mcg Inhalation Daily  . traZODone  25 mg Oral QHS  . Warfarin - Pharmacist Dosing Inpatient   Does not apply q1800     Infusions:     PRN Medications:  sodium chloride, acetaminophen, nitroGLYCERIN, ondansetron (ZOFRAN) IV, sodium chloride     Assessment/Plan    1. Acute on chronic systolic HF - - EF 123XX123 % in 05/2014. Decreased to 15% this admission with diffuse hypokinesis.  - Palliate Care has been seeing the patient. PT and OT recommend SNF but family wishes to take the patient home. From palliative notes family is divided on disposition for the the patient.  - negative 160 mL yesterday, negative 1.9 liters since admission. He is on lasix IV 80 bid, uptrend in Cr and BUN.  - stop IV lasix, start lasix 40mg  bid tomorrow.   2. Chronic atrial fibrillation/flutter - Coumadin  for anticoagulation.  - low-dose BB for rate control.  3. Stage 3-4 chronic kidney disease - creatinine elevated to 2.23 on 05/08/2015. - baseline 1.8 - 1.9.  4. Coronary artery disease with previous inferior infarction previous bypass grafting - s/p CABG in 1999 w/ LIMA-LAD, SVG-dx, SVG-OM, SVG-RCA, redo CABG in 2000 w/ SVG-OM and SVG-RCA. - continue BB  5. Heart Block - s/p MDT Pacemaker in 2014 - seen by EP this admission who changed his VVIR from 70bpm to VVI 50 bpm on his device.  6. Hypotension - BP has been 85/50 - 91/64 in the past 24 hours. - cannot further titrate medications at this time.   Awaiting placement. Would like to monitor his kidney function and low bp's today, will not discharge.   Carlyle Dolly, M.D.,

## 2015-05-10 LAB — BASIC METABOLIC PANEL
ANION GAP: 9 (ref 5–15)
BUN: 53 mg/dL — AB (ref 6–20)
CALCIUM: 8.6 mg/dL — AB (ref 8.9–10.3)
CHLORIDE: 99 mmol/L — AB (ref 101–111)
CO2: 31 mmol/L (ref 22–32)
CREATININE: 2.35 mg/dL — AB (ref 0.61–1.24)
GFR, EST AFRICAN AMERICAN: 28 mL/min — AB (ref >60)
GFR, EST NON AFRICAN AMERICAN: 24 mL/min — AB (ref >60)
Glucose, Bld: 78 mg/dL (ref 65–99)
POTASSIUM: 4.1 mmol/L (ref 3.5–5.1)
Sodium: 139 mmol/L (ref 135–145)

## 2015-05-10 LAB — CBC
HCT: 31.8 % — ABNORMAL LOW (ref 39.0–52.0)
Hemoglobin: 10.3 g/dL — ABNORMAL LOW (ref 13.0–17.0)
MCH: 27.8 pg (ref 26.0–34.0)
MCHC: 32.4 g/dL (ref 30.0–36.0)
MCV: 85.7 fL (ref 78.0–100.0)
PLATELETS: 158 10*3/uL (ref 150–400)
RBC: 3.71 MIL/uL — ABNORMAL LOW (ref 4.22–5.81)
RDW: 17.8 % — AB (ref 11.5–15.5)
WBC: 4.3 10*3/uL (ref 4.0–10.5)

## 2015-05-10 LAB — PROTIME-INR
INR: 2.8 — ABNORMAL HIGH (ref 0.00–1.49)
Prothrombin Time: 29.1 seconds — ABNORMAL HIGH (ref 11.6–15.2)

## 2015-05-10 MED ORDER — WARFARIN 0.5 MG HALF TABLET
0.5000 mg | ORAL_TABLET | Freq: Once | ORAL | Status: AC
  Administered 2015-05-10: 0.5 mg via ORAL
  Filled 2015-05-10: qty 1

## 2015-05-10 NOTE — Progress Notes (Signed)
Patient ID: Edward [name], male   DOB: September 13, 1929, 81 y.o.   MRN: [id number]     Subjective:    No SOB  Objective:   Temp:  [97.4 F (36.3 C)-97.8 F (36.6 C)] 97.6 F (36.4 C) (12/11 0408) Pulse Rate:  [64-104] 64 (12/11 0408) Resp:  [16-18] 16 (12/10 1939) BP: (82-93)/(51-66) 87/51 mmHg (12/11 0408) SpO2:  [89 %-98 %] 89 % (12/11 0935) Weight:  [126 lb 4.8 oz (57.289 kg)] 126 lb 4.8 oz (57.289 kg) (12/11 0408) Last BM Date: 05/09/15  Filed Weights   05/08/15 0500 05/09/15 0457 05/10/15 0408  Weight: 130 lb 15 oz (59.394 kg) 130 lb 14.2 oz (59.37 kg) 126 lb 4.8 oz (57.289 kg)    Intake/Output Summary (Last 24 hours) at 05/10/15 0951 Last data filed at 05/10/15 0418  Gross per 24 hour  Intake    240 ml  Output    600 ml  Net   -360 ml     Exam:  General: NAD  Resp: CTAB  Cardiac: RRR, no m/r/g, no jvd  GI: abdomen soft, NT, ND  MSK: no LE edema  Neuro: no focal deficits  Psych: appropraite affect  Lab Results:  Basic Metabolic Panel:  Recent Labs Lab 05/08/15 0353 05/09/15 0319 05/10/15 0510  NA 136 137 139  K 3.7 3.9 4.1  CL 96* 98* 99*  CO2 30 31 31   GLUCOSE 93 74 78  BUN 47* 50* 53*  CREATININE 2.23* 2.37* 2.35*  CALCIUM 8.4* 8.4* 8.6*    Liver Function Tests:  Recent Labs Lab 05/04/15 1205  AST 34  ALT 20  ALKPHOS 145*  BILITOT 1.7*  PROT 6.9  ALBUMIN 2.4*    CBC:  Recent Labs Lab 05/04/15 1205 05/07/15 0227 05/10/15 0510  WBC 5.5 5.1 4.3  HGB 10.3* 10.5* 10.3*  HCT 32.3* 33.0* 31.8*  MCV 85.0 85.7 85.7  PLT 161 149* 158    Cardiac Enzymes: No results for input(s): CKTOTAL, CKMB, CKMBINDEX, TROPONINI in the last 168 hours.  BNP: No results for input(s): PROBNP in the last 8760 hours.  Coagulation:  Recent Labs Lab 05/08/15 0353 05/09/15 0319 05/10/15 0510  INR 2.75* 2.75* 2.80*    ECG:   Medications:   Scheduled Medications: . bisoprolol  2.5 mg Oral Daily  . feeding supplement (ENSURE ENLIVE)   237 mL Oral Q24H  . lactose free nutrition  237 mL Oral Q24H  . mometasone-formoterol  2 puff Inhalation BID  . polyethylene glycol  17 g Oral Daily  . sodium chloride  3 mL Intravenous Q12H  . tiotropium  18 mcg Inhalation Daily  . traZODone  25 mg Oral QHS  . Warfarin - Pharmacist Dosing Inpatient   Does not apply q1800     Infusions:     PRN Medications:  sodium chloride, acetaminophen, nitroGLYCERIN, ondansetron (ZOFRAN) IV, sodium chloride     Assessment/Plan    1. Acute on chronic systolic HF - - EF 123XX123 % in 05/2014. Decreased to 15% this admission with diffuse hypokinesis.  - Palliative Care has been seeing the patient. PT and OT recommend SNF but family wishes to take the patient home. From palliative notes family is divided on disposition for the the patient.  - negative 360 mL yesterday, negative 2.2 liters since admission. IV diuretics stopped due to uptrend in Cr and BUN, baselien Cr appears to be 1.8 to 1.9. Hold diuretics today and likely start oral tomorrow.   2. Chronic atrial fibrillation/flutter -  Coumadin for anticoagulation.  - low-dose BB for rate control.  3. Stage 3-4 chronic kidney disease - baseline 1.8 - 1.9. Uptrend has stabilized at 2.35, diurteics on hold  4. Coronary artery disease with previous inferior infarction previous bypass grafting - s/p CABG in 1999 w/ LIMA-LAD, SVG-dx, SVG-OM, SVG-RCA, redo CABG in 2000 w/ SVG-OM and SVG-RCA. - continue BB  5. Heart Block - s/p MDT Pacemaker in 2014 - seen by EP this admission who changed his VVIR from 70bpm to VVI 50 bpm on his device.  6. Hypotension - low bp's, I suspect he is somewhat overdiuresed - remains only on low dose bisoprolol - holding diuretics today.    Dispo: awaiting placement, has been complicated by differences in family preferences. Social work and palliative are helping, hopefully Monday can get arranged.      Carlyle Dolly, M.D.,

## 2015-05-10 NOTE — Progress Notes (Signed)
I met briefly with Edward [name].  He denied complaints and only wanted to speak about getting ice cream to eat.  I asked his care team to check on getting him some ice cream.  I called and talked with his son, Edward [name].  He states that the family is "processing everything" but seems to be in agreement that the plan moving forward will be to pursue course of rehabilitation and "seeing how that goes before we decide about anything else." He states that his father was fairly lucid yesterday and was able to participate some in conversation about this plan.   He reports that he will be back at the hospital tomorrow around 10:00.  Will plan on someone from our team following up with him tomorrow.  Micheline Rough, MD [place] Team [phone]

## 2015-05-10 NOTE — Progress Notes (Signed)
ANTICOAGULATION CONSULT NOTE - Follow Up Consult  Pharmacy Consult for Coumadin Indication: atrial fibrillation  Allergies  Allergen Reactions  . Ace Inhibitors Cough  . Clarithromycin Other (See Comments)    Strange thoughts and fell with biaxin  . Codeine Nausea And Vomiting  . Oxycodone-Acetaminophen Other (See Comments)    Felt closed in  . Viberzi [Eluxadoline] Other (See Comments)    Severe cramps and impaction    Patient Measurements: Height: 5\' 10"  (177.8 cm) Weight: 126 lb 4.8 oz (57.289 kg) IBW/kg (Calculated) : 73 Heparin Dosing Weight:   Vital Signs: Temp: 97 F (36.1 C) (12/11 1200) Temp Source: Oral (12/11 1200) BP: 101/60 mmHg (12/11 1200) Pulse Rate: 66 (12/11 1200)  Labs:  Recent Labs  05/08/15 0353 05/09/15 0319 05/10/15 0510  HGB  --   --  10.3*  HCT  --   --  31.8*  PLT  --   --  158  LABPROT 28.7* 28.6* 29.1*  INR 2.75* 2.75* 2.80*  CREATININE 2.23* 2.37* 2.35*    Estimated Creatinine Clearance: 19.3 mL/min (by C-G formula based on Cr of 2.35).   Medications:  Scheduled:  . bisoprolol  2.5 mg Oral Daily  . feeding supplement (ENSURE ENLIVE)  237 mL Oral Q24H  . lactose free nutrition  237 mL Oral Q24H  . mometasone-formoterol  2 puff Inhalation BID  . polyethylene glycol  17 g Oral Daily  . sodium chloride  3 mL Intravenous Q12H  . tiotropium  18 mcg Inhalation Daily  . traZODone  25 mg Oral QHS  . Warfarin - Pharmacist Dosing Inpatient   Does not apply q1800    Assessment: 81yo male with AFib, here for inpt diuresis.  Home dose of 1mg  daily, but INR has trended up and has been receiving 0.5mg  of Coumadin for last few days.  INR remains therapeutic and is stable at 2.8 today.  Hg is stable, pltc wnl.  No bleeding noted.  Goal of Therapy:  INR 2-3 Monitor platelets by anticoagulation protocol: Yes   Plan:  Repeat Coumadin 0.5mg  Daily INR Watch for s/s of bleeding  Gracy Bruins, PharmD [facility]

## 2015-05-11 LAB — BASIC METABOLIC PANEL
Anion gap: 12 (ref 5–15)
BUN: 54 mg/dL — ABNORMAL HIGH (ref 6–20)
CALCIUM: 8.6 mg/dL — AB (ref 8.9–10.3)
CO2: 25 mmol/L (ref 22–32)
CREATININE: 2.39 mg/dL — AB (ref 0.61–1.24)
Chloride: 101 mmol/L (ref 101–111)
GFR, EST AFRICAN AMERICAN: 27 mL/min — AB (ref >60)
GFR, EST NON AFRICAN AMERICAN: 24 mL/min — AB (ref >60)
Glucose, Bld: 70 mg/dL (ref 65–99)
Potassium: 4.8 mmol/L (ref 3.5–5.1)
SODIUM: 138 mmol/L (ref 135–145)

## 2015-05-11 LAB — PROTIME-INR
INR: 2.77 — AB (ref 0.00–1.49)
PROTHROMBIN TIME: 28.9 s — AB (ref 11.6–15.2)

## 2015-05-11 MED ORDER — WARFARIN 0.5 MG HALF TABLET
0.5000 mg | ORAL_TABLET | Freq: Every day | ORAL | Status: DC
  Administered 2015-05-11: 0.5 mg via ORAL
  Filled 2015-05-11 (×2): qty 1

## 2015-05-11 NOTE — Progress Notes (Signed)
Spoke with son Edward [name] and we will meet with Edward [name] along with his daughter, Edward [name], and other son, Edward [name], over the phone. Will meet 05/12/15 1300 to further discuss goals of care.   Vinie Sill, NP Palliative Medicine Team Pager # [phone] (M-F 8a-5p) Team Phone # [phone] (Nights/Weekends)

## 2015-05-11 NOTE — Progress Notes (Signed)
PT Cancellation Note  Patient Details Name: MORI HUSEIN MRN: [id number] DOB: 1929-12-23   Cancelled Treatment:    Reason Eval/Treat Not Completed: Other (comment); patient disoriented and just s/p OT.  Will hold off today and attempt to see pt tomorrow.     WYNN,CYNDI 05/11/2015, 4:17 PM  Magda Kiel, [place] 05/11/2015

## 2015-05-11 NOTE — Progress Notes (Signed)
Paged Dr. Wynonia Lawman regarding BP of 78/58. No further orders at this time. Will continue to monitor pt.

## 2015-05-11 NOTE — Care Management Note (Signed)
Case Management Note  Patient Details  Name: Edward [name] MRN: [id number] Date of Birth: [birth year]  Subjective/Objective:     CHF, afib, failure to thrive               Action/Plan: Chart reviewed. Planned dc to SNF. CSW following for SNF placement.   Expected Discharge Date:  05/12/2015              Expected Discharge Plan:  Skilled Nursing Facility  In-House Referral:  Clinical Social Work  Discharge planning Services  CM Consult  Post Acute Care Choice:  NA Choice offered to:  NA  DME Arranged:  N/A DME Agency:  NA  HH Arranged:  NA HH Agency:  NA  Status of Service:  Completed, signed off  Medicare Important Message Given:  Yes Date Medicare IM Given:    Medicare IM give by:    Date Additional Medicare IM Given:    Additional Medicare Important Message give by:     If discussed at [place] of Stay Meetings, dates discussed:    Additional Comments:  Erenest Rasher, RN 05/11/2015, 4:21 PM

## 2015-05-11 NOTE — Progress Notes (Signed)
Subjective:  Patient more alert this morning.  Extensive conversation with a greater than 35 minutes spent with patient at bedside.  The son came in the room at the very end of the discussion.  Events over the weekend were reviewed.  Not currently short of breath.  His.  Cachectic.  When I spoke with the patient and he stated that he warranted to try to get his strength up and gain weight and get his appetite back.  Objective:  Vital Signs in the last 24 hours: BP 95/61 mmHg  Pulse 65  Temp(Src) 97 F (36.1 C) (Oral)  Resp 20  Ht 5\' 10"  (1.778 m)  Wt 56.518 kg (124 lb 9.6 oz)  BMI 17.88 kg/m2  SpO2 95%  Physical Exam: Cachectic male sitting up at site of bed in no acute distress Lungs: Reduced breath sounds at the base Cardiac: Irregular rhythm rhythm, normal S1 and S2, S3 heard, 2/6 systolic murmur over aortic valve  Abdomen:  Soft, nontender, no masses Extremities:  1+ edema noted   Intake/Output from previous day: 12/11 0701 - 12/12 0700 In: 640 [P.O.:640] Out: 1050 [Urine:1050] Weight Filed Weights   05/09/15 0457 05/10/15 0408 05/11/15 0641  Weight: 59.37 kg (130 lb 14.2 oz) 57.289 kg (126 lb 4.8 oz) 56.518 kg (124 lb 9.6 oz)    Lab Results: Basic Metabolic Panel:  Recent Labs  05/10/15 0510 05/11/15 0503  NA 139 138  K 4.1 4.8  CL 99* 101  CO2 31 25  GLUCOSE 78 70  BUN 53* 54*  CREATININE 2.35* 2.39*    CBC:  Recent Labs  05/10/15 0510  WBC 4.3  HGB 10.3*  HCT 31.8*  MCV 85.7  PLT 158    BNP    Component Value Date/Time   BNP 2095.0* 05/04/2015 1205    PROTIME: Lab Results  Component Value Date   INR 2.77* 05/11/2015   INR 2.80* 05/10/2015   INR 2.75* 05/09/2015    Telemetry: Atrial fibrillation, with occasional PVCs.   Assessment/Plan:  1.  Acute on chronic systolic heart failure-diuresed with edema improved and weight now probably a baseline  2.  Chronic atrial fibrillation/flutter 3.  Long-term use of anticoagulation 4.  Stage  3-4 chronic kidney disease 5.  Coronary artery disease with previous inferior infarction previous bypass grafting 6.  Failure to thrive and protein malnutrition  Recommendations:  Family is somewhat conflicted.  He has a daughter and 2 sons.  We'll plan to go ahead and arrange for him to go to something like-in place for a period of rehabilitation.  Have had extensive discussions with the family as well as the patient about the fact that he has severe congestive heart failure that likely will not improve.  I personally think the best option would be to involve hospice at this point although I don't think that the family is ready for that.  I have been concerned about patient's safety at home for some time.  Appreciate palliative care involvement. Kerry Hough  MD [facility] Cardiology  05/11/2015, 9:20 AM

## 2015-05-11 NOTE — Progress Notes (Signed)
Daily Progress Note   Patient Name: Edward [name]       Date: 05/11/2015 DOB: Aug 19, 1929  Age: 81 y.o. MRN#: [id number] Attending Physician: Jacolyn Reedy, MD Primary Care Physician: Gwendolyn Grant, MD Admit Date: 05/04/2015  Reason for Consultation/Follow-up: Establishing goals of care  Subjective: Edward [name] is more alert today. He seems more with it at times but still drifts in and out of alertness. He does remember falling here at the hospital and talks about being "tied down" referring to his condom cath that supposedly made him fall. He says that this hurts his feelings. He also is struggling with his dentures, he does not like the glue and they do not fit well since he has lost so much weight. Son, Edward [name], is at bedside. Edward [name] says that he did eat oatmeal this morning. Edward [name] says that they need to all talk together about a plan moving forward. He says that he has had a fairly lucid conversation with his father on Saturday. Family very supportive of him. I am unsure what the discussion with his father resulted in from Saturday but we will discuss more tomorrow. Support provided. Will await to hear from Edward [name] for meeting time tomorrow.    Length of Stay: 7 days  Current Medications: Scheduled Meds:  . bisoprolol  2.5 mg Oral Daily  . feeding supplement (ENSURE ENLIVE)  237 mL Oral Q24H  . lactose free nutrition  237 mL Oral Q24H  . mometasone-formoterol  2 puff Inhalation BID  . polyethylene glycol  17 g Oral Daily  . sodium chloride  3 mL Intravenous Q12H  . tiotropium  18 mcg Inhalation Daily  . traZODone  25 mg Oral QHS  . warfarin  0.5 mg Oral q1800  . Warfarin - Pharmacist Dosing Inpatient   Does not apply q1800    Continuous Infusions:    PRN Meds: sodium chloride,  acetaminophen, nitroGLYCERIN, ondansetron (ZOFRAN) IV, sodium chloride  Physical Exam: Physical Exam  Constitutional: He appears well-developed. He appears cachectic.  Cardiovascular: Normal rate.   Pulmonary/Chest: Effort normal. No accessory muscle usage. No tachypnea. No respiratory distress.  Abdominal: Soft. Normal appearance.  Neurological: He is disoriented.                Vital Signs: BP 120/69 mmHg  Pulse 62  Temp(Src) 97 F (36.1 C) (Oral)  Resp 20  Ht 5\' 10"  (1.778 m)  Wt 56.518 kg (124 lb 9.6 oz)  BMI 17.88 kg/m2  SpO2 95% SpO2: SpO2: 95 % O2 Device: O2 Device: Not Delivered O2 Flow Rate: O2 Flow Rate (L/min): 0 L/min  Intake/output summary:   Intake/Output Summary (Last 24 hours) at 05/11/15 1105 Last data filed at 05/11/15 1035  Gross per 24 hour  Intake    720 ml  Output    850 ml  Net   -130 ml   LBM: Last BM Date: 05/10/15 Baseline Weight: Weight: 64.184 kg (141 lb 8 oz) Most recent weight: Weight: 56.518 kg (124 lb 9.6 oz) (bed scale)       Palliative Assessment/Data: Flowsheet Rows        Most Recent Value   Intake Tab    Referral Department  Cardiology   Unit at Time of Referral  Cardiac/Telemetry Unit   Palliative Care Primary Diagnosis  Cardiac   Date Notified  05/06/15   Palliative Care Type  New Palliative care   Reason for referral  Clarify Goals of Care   Date of Admission  05/04/15   Date first seen by Palliative Care  05/07/15   # of days Palliative referral response time  1 Day(s)   # of days IP prior to Palliative referral  2   Clinical Assessment    Psychosocial & Spiritual Assessment    Palliative Care Outcomes       Additional Data Reviewed: CBC    Component Value Date/Time   WBC 4.3 05/10/2015 0510   WBC 8.7 07/07/2014   RBC 3.71* 05/10/2015 0510   HGB 10.3* 05/10/2015 0510   HCT 31.8* 05/10/2015 0510   PLT 158 05/10/2015 0510   MCV 85.7 05/10/2015 0510   MCH 27.8 05/10/2015 0510   MCHC 32.4 05/10/2015 0510   RDW  17.8* 05/10/2015 0510   LYMPHSABS 0.7 05/04/2015 1205   MONOABS 0.8 05/04/2015 1205   EOSABS 0.1 05/04/2015 1205   BASOSABS 0.0 05/04/2015 1205    CMP     Component Value Date/Time   NA 138 05/11/2015 0503   NA 136* 07/01/2014   K 4.8 05/11/2015 0503   CL 101 05/11/2015 0503   CO2 25 05/11/2015 0503   GLUCOSE 70 05/11/2015 0503   BUN 54* 05/11/2015 0503   BUN 22* 07/01/2014   CREATININE 2.39* 05/11/2015 0503   CREATININE 1.4* 07/01/2014   CALCIUM 8.6* 05/11/2015 0503   PROT 6.9 05/04/2015 1205   ALBUMIN 2.4* 05/04/2015 1205   AST 34 05/04/2015 1205   ALT 20 05/04/2015 1205   ALKPHOS 145* 05/04/2015 1205   BILITOT 1.7* 05/04/2015 1205   GFRNONAA 24* 05/11/2015 0503   GFRAA 27* 05/11/2015 0503       Problem List:  Patient Active Problem List   Diagnosis Date Noted  . Pressure ulcer 05/11/2015  . Weight loss   . Dyspnea   . Palliative care encounter 05/07/2015  . Protein-calorie malnutrition, severe 05/05/2015  . Tachycardia-bradycardia ([place])   . Acute on chronic systolic (congestive) heart failure ([place]) 05/04/2015  . Chronic combined systolic and diastolic heart failure ([place])   . Elevated serum free T4 level 12/14/2014  . Cancer of bladder wall ([place]) 06/16/2014  . Atrial flutter ([place]) 12/30/2013  . Atrial fibrillation ([place]) 03/28/2013  . Long-term (current) use of anticoagulants 03/28/2013  . Atherosclerosis of native arteries of the extremities with intermittent claudication 10/30/2012  . BPH (benign prostatic  hypertrophy)   . Insomnia   . Chronic diastolic heart failure ([place]) 03/30/2009  . History of carotid endarterectomy 03/30/2009  . Gout   . CAD (coronary artery disease), native coronary artery   . LBBB   . Chronic kidney disease stage III (GFR 30-59 ml/min)   . Hyperlipidemia   . Hypertensive heart disease   . COPD GOLD II/III 05/05/2007     Palliative Care Assessment & Plan    1.Code Status:  Full code    Code Status Orders         Start     Ordered   05/04/15 1131  Full code   Continuous     05/04/15 1130    Advance Directive Documentation        Most Recent Value   Type of Advance Directive  Living will [dt is poa/not hcpoa. kathy nance-]   Pre-existing out of facility DNR order (yellow form or pink MOST form)     "MOST" Form in Place?         2. Goals of Care/Additional Recommendations:  Goals are unclear at this point. Will remeet with all family tomorrow with Mr. Salaiz - hoping he will have more clarity.   Limitations on Scope of Treatment: Full Scope Treatment  Desire for further Chaplaincy support:no  Psycho-social Needs: Caregiving  Support/Resources and Education on Hospice  3. Symptom Management:  Insomnia: Continue trial trazodone 25 mg qhs. Consider increasing if needed and tolerating - family does not want to increase.   Decreased appetite: Encourage foods he likes. Encourage supplements.   Weakness: PT to see.  4. Palliative Prophylaxis:   Bowel Regimen and Delirium Protocol  5. Prognosis: < 6 months  6. Discharge Planning:  To be determined.    Thank you for allowing the Palliative Medicine Team to assist in the care of this patient.   Time In: 0950 Time Out: 1030 Total Time 34min Prolonged Time Billed  no         Pershing Proud, NP  [id number], 11:05 AM  Please contact Palliative Medicine Team phone at [phone] for questions and concerns.

## 2015-05-11 NOTE — Progress Notes (Signed)
Occupational Therapy Treatment Patient Details Name: Edward [name] MRN: [id number] DOB: 1930-03-05 Today's Date: 05/11/2015    History of present illness Patient is an 81 y/o male admitted with acute on chronic CHF.  PMH positive for CAD, CABG, PPM, AAA, A-fib, HTN, BPH, bladder ca s/p TURBT 1/16 & 11/16.   OT comments  Pt very confused and argumentative during OT session.  Initially required constant redirection and coaxing to participate, somewhat more lucid by end of session, but disoriented and unsure how he got here, and why he is here.  Currently, he requires max A for ADLs and mod A for functional transfers.   Continue to recommend SNF at discharge.   Follow Up Recommendations  SNF;Supervision/Assistance - 24 hour    Equipment Recommendations  None recommended by OT    Recommendations for Other Services      Precautions / Restrictions Precautions Precautions: Fall       Mobility Bed Mobility Overal bed mobility: Needs Assistance Bed Mobility: Supine to Sit;Sit to Supine     Supine to sit: Min assist Sit to supine: Min assist   General bed mobility comments: Assist to lift trunk and assist to Lift LEs onto the bed   Transfers Overall transfer level: Needs assistance Equipment used: Rolling walker (2 wheeled) Transfers: Sit to/from [facility] Sit to Stand: Mod assist Stand pivot transfers: Min assist       General transfer comment: Mod A to move to standing and min A to pivot     Balance Overall balance assessment: Needs assistance Sitting-balance support: Feet supported Sitting balance-Leahy Scale: Fair     Standing balance support: Bilateral upper extremity supported Standing balance-Leahy Scale: Poor                     ADL Overall ADL's : Needs assistance/impaired Eating/Feeding: Moderate assistance;Bed level                       Toilet Transfer: Moderate assistance;Stand-pivot;BSC;RW Toilet Transfer Details  (indicate cue type and reason): mod A sit to stand and min A to pivot  Toileting- Clothing Manipulation and Hygiene: Total assistance;Sit to/from stand       Functional mobility during ADLs: Moderate assistance;Minimal assistance General ADL Comments: Pt requires significant coaxing and increased time to complete tasks.  Pt argumentative and requires constant redirection       Vision                     Perception     Praxis      Cognition   Behavior During Therapy:  (irritable ) Overall Cognitive Status: Impaired/Different from baseline Area of Impairment: Orientation;Attention;Following commands;Memory;Safety/judgement;Problem solving;Awareness Orientation Level: Disoriented to;Place;Time;Situation Current Attention Level: Sustained Memory: Decreased short-term memory  Following Commands: Follows one step commands consistently Safety/Judgement: Decreased awareness of safety;Decreased awareness of deficits   Problem Solving: Slow processing;Decreased initiation;Requires verbal cues;Difficulty sequencing;Requires tactile cues General Comments: Pt is confused and argumentative.  Re-directed during session.     Extremity/Trunk Assessment               Exercises     Shoulder Instructions       General Comments      Pertinent Vitals/ Pain       Pain Assessment: Faces Faces Pain Scale: No hurt  Home Living  Prior Functioning/Environment              Frequency Min 2X/week     Progress Toward Goals  OT Goals(current goals can now be found in the care plan section)  Progress towards OT goals: Not progressing toward goals - comment (confusion )  ADL Goals Pt Will Perform Upper Body Bathing: with set-up;sitting Pt Will Transfer to Toilet: with min assist;ambulating;bedside commode Additional ADL Goal #1: Pt will complete bed mobility with HOB 20 degrees min (A) level as precursor to adls   Plan Discharge plan remains appropriate    Co-evaluation                 End of Session Equipment Utilized During Treatment: Rolling walker   Activity Tolerance Patient limited by fatigue;Other (comment) (confusion )   Patient Left in bed;with call bell/phone within reach;with bed alarm set   Nurse Communication Mobility status        Time: 1355-1433 OT Time Calculation (min): 38 min  Charges: OT General Charges $OT Visit: 1 Procedure OT Treatments $Therapeutic Activity: 38-52 mins  Shawnay Bramel M 05/11/2015, 2:44 PM

## 2015-05-11 NOTE — Care Management Important Message (Signed)
Important Message  Patient Details  Name: Edward [name] MRN: [id number] Date of Birth: April 22, 1930   Medicare Important Message Given:  Yes    Merrisa Skorupski P Wilberto Console 05/11/2015, 2:09 PM

## 2015-05-11 NOTE — Progress Notes (Signed)
ANTICOAGULATION CONSULT NOTE - Follow Up Consult  Pharmacy Consult for warfarin Indication: atrial fibrillation  Allergies  Allergen Reactions  . Ace Inhibitors Cough  . Clarithromycin Other (See Comments)    Strange thoughts and fell with biaxin  . Codeine Nausea And Vomiting  . Oxycodone-Acetaminophen Other (See Comments)    Felt closed in  . Viberzi [Eluxadoline] Other (See Comments)    Severe cramps and impaction    Patient Measurements: Height: 5\' 10"  (177.8 cm) Weight: 124 lb 9.6 oz (56.518 kg) (bed scale) IBW/kg (Calculated) : 73    Vital Signs: Temp: 97 F (36.1 C) (12/12 0641) Temp Source: Oral (12/12 0641) BP: 95/61 mmHg (12/12 0641) Pulse Rate: 65 (12/12 0641)  Labs:  Recent Labs  05/09/15 0319 05/10/15 0510 05/11/15 0503  HGB  --  10.3*  --   HCT  --  31.8*  --   PLT  --  158  --   LABPROT 28.6* 29.1* 28.9*  INR 2.75* 2.80* 2.77*  CREATININE 2.37* 2.35* 2.39*    Estimated Creatinine Clearance: 18.7 mL/min (by C-G formula based on Cr of 2.39).  Assessment: 81yo male with AFib, here for diuresis.  Home dose of 1mg  daily, but INR has trended up and has been receiving 0.5mg  of Coumadin for last few days.  INR has remained therapeutic and stable. This morning's INR 2.77. Hgb 10.3- low, butstable, plts wnl.  No bleeding noted.  Goal of Therapy:  INR 2-3 Monitor platelets by anticoagulation protocol: Yes   Plan:  -will order warfarin 0.5mg  daily -change INR checks to MWF. Will schedule CBCs for those days as well. -watch for s/s of bleeding  Raja Caputi D. Altovise Wahler, PharmD, BCPS Clinical Pharmacist Pager: [phone] 05/11/2015 10:22 AM

## 2015-05-12 LAB — BASIC METABOLIC PANEL
ANION GAP: 8 (ref 5–15)
BUN: 53 mg/dL — AB (ref 6–20)
CALCIUM: 8.4 mg/dL — AB (ref 8.9–10.3)
CO2: 32 mmol/L (ref 22–32)
CREATININE: 2.39 mg/dL — AB (ref 0.61–1.24)
Chloride: 101 mmol/L (ref 101–111)
GFR calc Af Amer: 27 mL/min — ABNORMAL LOW (ref >60)
GFR, EST NON AFRICAN AMERICAN: 24 mL/min — AB (ref >60)
GLUCOSE: 88 mg/dL (ref 65–99)
Potassium: 3.8 mmol/L (ref 3.5–5.1)
Sodium: 141 mmol/L (ref 135–145)

## 2015-05-12 MED ORDER — TRAZODONE HCL 50 MG PO TABS: 25.0000 mg | ORAL_TABLET | Freq: Every day | ORAL | Status: DC

## 2015-05-12 MED ORDER — BISOPROLOL FUMARATE 5 MG PO TABS: 2.5000 mg | ORAL_TABLET | Freq: Every day | ORAL | Status: DC

## 2015-05-12 MED ORDER — FUROSEMIDE 80 MG PO TABS: 80.0000 mg | ORAL_TABLET | Freq: Two times a day (BID) | ORAL | Status: DC

## 2015-05-12 MED ORDER — ENSURE ENLIVE PO LIQD: 237.0000 mL | ORAL | Status: DC

## 2015-05-12 MED ORDER — WARFARIN SODIUM 1 MG PO TABS: 0.5000 mg | ORAL_TABLET | Freq: Every day | ORAL | Status: DC

## 2015-05-12 NOTE — Progress Notes (Signed)
Patient will discharge to [facility] Anticipated discharge date:12/13 Family notified: son at bedside Transportation by [facility]- scheduled for 2:30pm  CSW signing off.  Domenica Reamer, [place] Social Worker [phone]

## 2015-05-12 NOTE — Progress Notes (Signed)
Physical Therapy Treatment Patient Details Name: AUGIE MCMANAMA MRN: [id number] DOB: 1929-10-02 Today's Date: 05/12/2015    History of Present Illness Patient is an 81 y/o male admitted with acute on chronic CHF.  PMH positive for CAD, CABG, PPM, AAA, A-fib, HTN, BPH, bladder ca s/p TURBT 1/16 & 11/16.    PT Comments    Progressing slowly, but participated well today.  Emphasis on exercise and progressive ambulation.  Follow Up Recommendations  SNF     Equipment Recommendations  None recommended by PT    Recommendations for Other Services       Precautions / Restrictions Precautions Precautions: Fall    Mobility  Bed Mobility Overal bed mobility: Needs Assistance Bed Mobility: Supine to Sit     Supine to sit: Min assist        Transfers Overall transfer level: Needs assistance Equipment used: Rolling walker (2 wheeled) Transfers: Sit to/from Stand Sit to Stand: Min assist         General transfer comment: assist to help pt come forward  Ambulation/Gait Ambulation/Gait assistance: Min assist Ambulation Distance (Feet): 40 Feet Assistive device: Rolling walker (2 wheeled) Gait Pattern/deviations: Step-through pattern;Decreased step length - right;Decreased step length - left;Decreased stride length Gait velocity: slower   General Gait Details: weak gait with help needed to keep the RW appropriately far in front of him and for stability   Stairs            Wheelchair Mobility    Modified Rankin (Stroke Patients Only)       Balance Overall balance assessment: Needs assistance Sitting-balance support: No upper extremity supported Sitting balance-Leahy Scale: Fair       Standing balance-Leahy Scale: Poor                      Cognition Arousal/Alertness: Awake/alert Behavior During Therapy: Flat affect Overall Cognitive Status: Impaired/Different from baseline Area of Impairment: Orientation;Attention;Following  commands;Memory;Safety/judgement;Problem solving;Awareness Orientation Level: Place;Time;Situation Current Attention Level: Sustained Memory: Decreased short-term memory Following Commands: Follows one step commands consistently Safety/Judgement: Decreased awareness of safety;Decreased awareness of deficits Awareness: Intellectual Problem Solving: Slow processing;Decreased initiation;Requires verbal cues;Difficulty sequencing;Requires tactile cues      Exercises General Exercises - Lower Extremity Straight Leg Raises: AROM;AAROM;Strengthening;Both;10 reps;Supine Hip Flexion/Marching: AROM;Strengthening;Both;10 reps;Supine Other Exercises Other Exercises: bridges x10 reps    General Comments        Pertinent Vitals/Pain Pain Assessment: Faces Faces Pain Scale: Hurts a little bit Pain Location: vague Pain Descriptors / Indicators: Moaning Pain Intervention(s): Limited activity within patient's tolerance;Premedicated before session;Monitored during session    Home Living                      Prior Function            PT Goals (current goals can now be found in the care plan section) Acute Rehab PT Goals Patient Stated Goal: to get out of here today PT Goal Formulation: With patient Time For Goal Achievement: 05/21/15 Potential to Achieve Goals: Fair Progress towards PT goals: Progressing toward goals    Frequency  Min 3X/week    PT Plan Current plan remains appropriate    Co-evaluation             End of Session   Activity Tolerance: Patient limited by fatigue Patient left: with call bell/phone within reach;in chair     Time: [id number] PT Time Calculation (min) (ACUTE ONLY): 35 min  Charges:  $Gait  Training: 8-22 mins $Therapeutic Exercise: 8-22 mins                    G Codes:      Deago Burruss, Tessie Fass 05/12/2015, 1:26 PM  05/12/2015  Donnella Sham, PT [phone]  (pager)

## 2015-05-12 NOTE — Discharge Summary (Addendum)
Physician Discharge Summary  Patient ID: Edward [name] MRN: [id number] DOB/AGE: [birth year] 81 y.o.  Admit date: 05/04/2015 Discharge date: 05/12/2015  Primary Discharge Diagnosis  1.  Acute on chronic systolic congestive heart failure   Discharge Diagnosis:  2.  Acute on chronic renal failure 3.  Recurrent bladder cancer 4.  Protein calorie malnutrition 5.  Confusion and failure to thrive 6.  Abnormal weight loss 7.  Severe COPD 8.  Chronic atrial fibrillation 9.  Coronary artery disease with previous bypass grafting 10.  Severe peripheral vascular disease  Procedures:   echocardiogram  Consults:  Electrophysiology, palliative care  Hospital Course: This 81 year old male has a history of chronic combined systolic and diastolic heart failure. He has known coronary artery disease with previous bypass grafting and has chronic atrial flutter as well as a permanent pacemaker. He has recently been diagnosed with bladder cancer and underwent TURBT the first part of November. He has had failure to thrive for most of the year and also has underlying pulmonary issues. He has lost a considerable amount of weight this year and is considerably malnourished. After his most recent bladder surgery he began to have significant scrotal edema and other edema of his legs. He was seen 2 weeks ago and his furosemide was increased but despite this he has gained 4 pounds and is quite weak. He has severe issues with deconditioning as well as severe chronic dyspnea and abnormal weight loss. Most recent surgical results showed recurrent cancer of the bladder which was invasive. He is admitted at this time for further treatment and diuresis and to determine issues to failure to thrive abnormal weight loss and to try to help diurese him.  The patient received intravenous diuresis and diuresed from weight of 141 pounds to 126 pounds.  He was seen in consultation by electrophysiology who did not think he  was a candidate for her biventricular pacemaking.  His pacemaker was reprogrammed to allow less pacing at this time.  He had lost significant amounts of weight and while in the hospital his penile edema and edema improved significantly.  Extensive discussions were had with the patient and the family.  He was noted to be quite weak and required physical therapy and in addition physical therapist did not feel he was able to be safely taken home.  He was confused periodically through the hospital in numerous attempts were made to try to clarify goals of care.  Discussion was to be had with the family regarding potentiality of hospice and extensive discussions were had with the patient.  His long-term prognosis is poor and there are no further invasive cardiac treatments that can be done for him other than medical therapy.  He is not felt to be candidate for ace inhibition or spironolactone due to his renal failure.  He was placed on a low dose of Zebeta and continued on diuretics.  He was over anticoagulated on admission and his warfarin was held and this improved also and stabilized.  His desire was to go to skilled nursing for further therapy and then to go home.  I would still strongly advise hospice on an outpatient basis at home but this is to be determined at the time of discharge.  His diuretic dose will need to be adjusted in the nursing home.  His pro times also will need to be evaluated early in lower dose of 0.5 mg daily.  Discharge Exam: Blood pressure 84/56, pulse 68, temperature 97.7 F (36.5 C), temperature source  Oral, resp. rate 18, height 5\' 10"  (1.778 m), weight 57.335 kg (126 lb 6.4 oz), SpO2 97 %. Weight: 57.335 kg (126 lb 6.4 oz) Cachectic appearing male, reduced breath sounds at bases, irregular rhythm,  Labs: CBC:   Lab Results  Component Value Date   WBC 4.3 05/10/2015   HGB 10.3* 05/10/2015   HCT 31.8* 05/10/2015   MCV 85.7 05/10/2015   PLT 158 05/10/2015    CMP:    Recent Labs Lab 05/12/15 0415  NA 141  K 3.8  CL 101  CO2 32  BUN 53*  CREATININE 2.39*  CALCIUM 8.4*  GLUCOSE 88   Lipid Panel     Component Value Date/Time   CHOL 122 10/30/2013 1209   TRIG 95.0 10/30/2013 1209   TRIG 296 04/10/2009   HDL 50.30 10/30/2013 1209   CHOLHDL 2 10/30/2013 1209   VLDL 19.0 10/30/2013 1209   LDLCALC 53 10/30/2013 1209   BNP (last 3 results)  Recent Labs  06/24/14 1445 05/04/15 1205  BNP 1232.5* 2095.0*   Protime: Lab Results  Component Value Date   INR 2.77* 05/11/2015   INR 2.80* 05/10/2015   INR 2.75* 05/09/2015   Thyroid: Lab Results  Component Value Date   TSH 4.462 05/04/2015    Radiology: Cardiomegaly, changes consistent with heart failure  EKG: Atrial fibrillation, left bundle branch block  Discharge Medications:   Medication List    STOP taking these medications        rosuvastatin 20 MG tablet  Commonly known as:  CRESTOR      TAKE these medications        bisoprolol 5 MG tablet  Commonly known as:  ZEBETA  Take 0.5 tablets (2.5 mg total) by mouth daily.     diazepam 2 MG tablet  Commonly known as:  VALIUM  Take 1 tablet (2 mg total) by mouth at bedtime as needed for sedation.     DULERA 100-5 MCG/ACT Aero  Generic drug:  mometasone-formoterol  Inhale 2 puffs into the lungs 2 (two) times daily.     feeding supplement (ENSURE ENLIVE) Liqd  Take 237 mLs by mouth daily.     furosemide 80 MG tablet  Commonly known as:  LASIX  Take 1 tablet (80 mg total) by mouth 2 (two) times daily. Take only if weight increases and needed to control edema and dyspnea     nitroGLYCERIN 0.4 MG SL tablet  Commonly known as:  NITROSTAT  Place 0.4 mg under the tongue every 5 (five) minutes as needed for chest pain.     polyethylene glycol packet  Commonly known as:  MIRALAX / GLYCOLAX  Take 17 g by mouth daily.     tiotropium 18 MCG inhalation capsule  Commonly known as:  SPIRIVA  Place 1 capsule (18 mcg total)  into inhaler and inhale daily.     traZODone 50 MG tablet  Commonly known as:  DESYREL  Take 0.5 tablets (25 mg total) by mouth at bedtime.     warfarin 1 MG tablet  Commonly known as:  COUMADIN  Take 0.5 tablets (0.5 mg total) by mouth daily at 6 PM.       Followup plans and appointments: He is to be taken to skilled nursing.  While in skilled nursing he is to have physical therapy and occupational therapy.  He should be weighed daily and his diuretic dose should be adjusted based upon his weights and output.  He should have a metabolic panel done  within 3 days to determine if his diuretic dose needs to be increased or decreased.  Time spent with patient to include physician time:  40 minutes.  Discharge weight 126 pounds.  Signed: Kerry Hough. MD [facility] 05/12/2015, 1:35 PM

## 2015-05-13 LAB — POCT INR: INR: 2.7 — AB (ref <=1.1)

## 2015-05-14 ENCOUNTER — Encounter: Payer: Self-pay | Admitting: Internal Medicine

## 2015-05-14 ENCOUNTER — Non-Acute Institutional Stay (SKILLED_NURSING_FACILITY): Payer: Medicare Other | Admitting: Internal Medicine

## 2015-05-14 DIAGNOSIS — S41119A Laceration without foreign body of unspecified upper arm, initial encounter: Secondary | ICD-10-CM

## 2015-05-14 DIAGNOSIS — K5900 Constipation, unspecified: Secondary | ICD-10-CM | POA: Diagnosis not present

## 2015-05-14 DIAGNOSIS — E785 Hyperlipidemia, unspecified: Secondary | ICD-10-CM

## 2015-05-14 DIAGNOSIS — D631 Anemia in chronic kidney disease: Secondary | ICD-10-CM

## 2015-05-14 DIAGNOSIS — N184 Chronic kidney disease, stage 4 (severe): Secondary | ICD-10-CM | POA: Diagnosis not present

## 2015-05-14 DIAGNOSIS — R5381 Other malaise: Secondary | ICD-10-CM

## 2015-05-14 DIAGNOSIS — I5042 Chronic combined systolic (congestive) and diastolic (congestive) heart failure: Secondary | ICD-10-CM | POA: Diagnosis not present

## 2015-05-14 DIAGNOSIS — E43 Unspecified severe protein-calorie malnutrition: Secondary | ICD-10-CM

## 2015-05-14 DIAGNOSIS — Z7901 Long term (current) use of anticoagulants: Secondary | ICD-10-CM | POA: Diagnosis not present

## 2015-05-14 DIAGNOSIS — I482 Chronic atrial fibrillation, unspecified: Secondary | ICD-10-CM

## 2015-05-14 DIAGNOSIS — R0600 Dyspnea, unspecified: Secondary | ICD-10-CM | POA: Diagnosis not present

## 2015-05-14 NOTE — Progress Notes (Signed)
Patient ID: Edward [name], male   DOB: 1929/10/31, 81 y.o.   MRN: [id number]     [facility]  PCP: Gwendolyn Grant, MD  Code Status: Full Code   Allergies  Allergen Reactions  . Ace Inhibitors Cough  . Clarithromycin Other (See Comments)    Strange thoughts and fell with biaxin  . Codeine Nausea And Vomiting  . Oxycodone-Acetaminophen Other (See Comments)    Felt closed in  . Viberzi [Eluxadoline] Other (See Comments)    Severe cramps and impaction    Chief Complaint  Patient presents with  . New Admit To SNF    New Admission      HPI:  81 y.o. patient is here for short term rehabilitation post hospital admission from 05/04/15-05/12/15 with acute on chronic systolic congestive heart failure. He required iv diuresis and was then transitioned to po lasix. He has PMH of CHF, CAD s/p CABG, atrial flutter s/p pacemaker and newly diagnosed bladder cancer s/p TURBT. He is seen in his room today with his son present.   Review of Systems:  Constitutional: positive for easy fatigue. Negative for fever, chills, diaphoresis.  HENT: Negative for headache, congestion, nasal discharge.  Eyes: Negative for eye pain, blurred vision, double vision and discharge.  Respiratory: positive for cough and dyspnea with exertion. Denies wheezing.   Cardiovascular: Negative for chest pain, palpitations, leg swelling.  Gastrointestinal: Negative for heartburn, nausea, vomiting, abdominal pain. Had bowel movement yesterday Genitourinary: Negative for dysuria  Musculoskeletal: Negative for back pain, falls Skin: Negative for rash.  Neurological: positive for occasional dizziness. Negative for focal weakness Psychiatric/Behavioral: Negative for depression.    Past Medical History  Diagnosis Date  . Gout     "only once in my lifetime" (12/18/2012)  . Depression   . PVD (peripheral vascular disease) ([place])     s/p B CEA  . COPD (chronic obstructive pulmonary disease) ([place])    PFT 6.19.07: FEV1 465  ratio 60 with 25% response to B2.  > PFTs 8.29.07: FEV1 61%  ratio 46% no better after B2.  > add on advair 12.20.11-improved 1.31.12  . AAA (abdominal aortic aneurysm) ([place])   . Melanosis coli   . Internal hemorrhoids   . Carotid artery occlusion   . Aortic valve disorder   . Chronic combined systolic and diastolic heart failure ([place])   . CAD (coronary artery disease), native coronary artery     CABG w LIMA to LAD, SVG to dx, OM, RCA 1989 Dr. Arlyce Dice for 3VD PTCA of OM, 1999 and 2000 Cath showed occlusion of left main and RCA with stenosis in OM Redo redo CABG w SVG to OM, SVG to RCA8/10/00 Dr. Cyndia Bent   . Hyperlipidemia   . BPH (benign prostatic hypertrophy)   . Hypertensive heart disease     Change toprol to bisoprolol 10 mg daily on trial basis  05/21/2012  - notified by [facility] non adherent 07/17/2012    . LBBB (left bundle branch block)   . Second degree heart block 12/18/2012    s/p MDT Adapta L pacemaker 12-20-2012 by Dr Caryl Comes  . Shingles     mild  . Chronic kidney disease stage III (GFR 30-59 ml/min)     saw dr Justin Mend oct 2015 and released by dr webb  . Congenital absence of kidney     "noted during AAA repair; never knew it before" (12/18/2012)  . Myocardial infarction ([place]) 1979    times 2  . Presence of  permanent cardiac pacemaker   . Cancer of bladder wall ([place]) 06/16/2014   Past Surgical History  Procedure Laterality Date  . Cholecystectomy  1998  . Hemiarthroplasty shoulder fracture Right ~ 2008     x 2 - [place]  . Carotid endarterectomy Bilateral 1990's  . Abdominal aortic aneurysm repair  1998  . Carotid-subclavian bypass graft Left 1990's  . Anal fissure repair      12/18/2012 "I don't remember this"  . Hemorroidectomy      12/18/2012 "I don't remember this"  . Carpal tunnel release Right 1980's    "Dr. Marrian Salvage" (12/18/2012)  . Cardiac catheterization      "2 or 3" (12/18/2012)  . Coronary angioplasty with stent placement      "I've got 1 or  2" (12/18/2012)  . Hip fracture surgery  1944    "fell out of a tree; had it operated on 3 times" (12/18/2012)  . Refractive surgery Bilateral 1990's?  . Pacemaker insertion  12-20-2012    dual chamber Medtronic Adapta L pacemaker implanted by Dr Caryl Comes  . Cardioversion N/A 12/30/2013    Procedure: CARDIOVERSION;  Surgeon: Jacolyn Reedy, MD;  Location: [facility];  Service: Cardiovascular;  Laterality: N/A;  . Permanent pacemaker insertion N/A 12/20/2012    Procedure: PERMANENT PACEMAKER INSERTION;  Surgeon: Deboraha Sprang, MD;  Location: [facility] CATH LAB;  Service: Cardiovascular;  Laterality: N/A;  . Coronary artery bypass graft  1989, 2000     "CABG X ?3; CABG X 5 w//Bartle" (12/18/2012)  . Transurethral resection of bladder tumor N/A 06/16/2014    Procedure: TRANSURETHRAL RESECTION OF BLADDER TUMOR (TURBT) ;  Surgeon: Jorja Loa, MD;  Location: [facility];  Service: Urology;  Laterality: N/A;  . Cardioversion N/A 09/26/2014    Procedure: CARDIOVERSION;  Surgeon: Jacolyn Reedy, MD;  Location: [place];  Service: Cardiovascular;  Laterality: N/A;  . Eye surgery      bil catarcts with lens implants on right eye  . Transurethral resection of bladder tumor N/A 04/02/2015    Procedure: TRANSURETHRAL RESECTION OF BLADDER TUMOR (TURBT);  Surgeon: Franchot Gallo, MD;  Location: [facility];  Service: Urology;  Laterality: N/A;  CYSTOSCOPY      Social History:   reports that he quit smoking about 18 years ago. His smoking use included Cigarettes. He has a 75 pack-year smoking history. He has never used smokeless tobacco. He reports that he drinks about 1.8 oz of alcohol per week. He reports that he does not use illicit drugs.  Family History  Problem Relation Age of Onset  . Heart disease Mother   . Colon cancer Neg Hx     Medications:   Medication List       This list is accurate as of: 05/14/15  9:52 AM.  Always use your most recent med list.               bisoprolol 5 MG  tablet  Commonly known as:  ZEBETA  Take 0.5 tablets (2.5 mg total) by mouth daily.     diazepam 2 MG tablet  Commonly known as:  VALIUM  Take 1 tablet (2 mg total) by mouth at bedtime as needed for sedation.     DULERA 100-5 MCG/ACT Aero  Generic drug:  mometasone-formoterol  Inhale 2 puffs into the lungs 2 (two) times daily.     furosemide 80 MG tablet  Commonly known as:  LASIX  Take 1 tablet (80 mg total) by mouth 2 (  two) times daily. Take only if weight increases and needed to control edema and dyspnea     nitroGLYCERIN 0.4 MG SL tablet  Commonly known as:  NITROSTAT  Place 0.4 mg under the tongue every 5 (five) minutes as needed for chest pain.     polyethylene glycol packet  Commonly known as:  MIRALAX / GLYCOLAX  Take 17 g by mouth daily.     rosuvastatin 20 MG tablet  Commonly known as:  CRESTOR  Take 20 mg by mouth daily.     tiotropium 18 MCG inhalation capsule  Commonly known as:  SPIRIVA  Place 1 capsule (18 mcg total) into inhaler and inhale daily.     traZODone 50 MG tablet  Commonly known as:  DESYREL  Take 0.5 tablets (25 mg total) by mouth at bedtime.     UNABLE TO FIND  Med Name: Med Pass 120 cc by mouth twice daily for malnutrition     warfarin 1 MG tablet  Commonly known as:  COUMADIN  Take 0.5 tablets (0.5 mg total) by mouth daily at 6 PM.         Physical Exam: Filed Vitals:   05/14/15 0932  Pulse: 63  Temp: 97 F (36.1 C)  TempSrc: Oral  Resp: 19  Height: 5\' 10"  (1.778 m)  SpO2: 92%   BP 104/66  General- elderly male, frail and thin built, in no acute distress Head- normocephalic, atraumatic Nose- no maxillary or frontal sinus tenderness, no nasal discharge Throat- moist mucus membrane Eyes- no pallor, no icterus, no discharge, normal conjunctiva, normal sclera Neck- no cervical lymphadenopathy Cardiovascular- normal s1,s2, no murmurs, trace leg edema Respiratory- bilateral decreased air entry, no wheeze, no rhonchi, no  crackles, no use of accessory muscles Abdomen- bowel sounds present, soft, non tender Musculoskeletal- able to move all 4 extremities, generalized weakness  Neurological- no focal deficit, alert and oriented to person, place and time Skin- warm and dry, skin tear to forearms Psychiatry- normal mood and affect    Labs reviewed: Basic Metabolic Panel:  Recent Labs  05/10/15 0510 05/11/15 0503 05/12/15 0415  NA 139 138 141  K 4.1 4.8 3.8  CL 99* 101 101  CO2 31 25 32  GLUCOSE 78 70 88  BUN 53* 54* 53*  CREATININE 2.35* 2.39* 2.39*  CALCIUM 8.6* 8.6* 8.4*   Liver Function Tests:  Recent Labs  12/11/14 1508 01/21/15 1234 05/04/15 1205  AST 29 40 34  ALT 28 28 20   ALKPHOS 123* 160* 145*  BILITOT 1.3* 1.7* 1.7*  PROT 7.0 8.0 6.9  ALBUMIN 3.2* 3.5 2.4*    Recent Labs  01/21/15 1234  LIPASE 22   No results for input(s): AMMONIA in the last 8760 hours. CBC:  Recent Labs  12/11/14 1508 01/21/15 1234  05/04/15 1205 05/07/15 0227 05/10/15 0510  WBC 6.6 6.7  < > 5.5 5.1 4.3  NEUTROABS 4.7 5.3  --  3.9  --   --   HGB 11.3* 12.0*  < > 10.3* 10.5* 10.3*  HCT 34.9* 36.8*  < > 32.3* 33.0* 31.8*  MCV 82.3 87.6  < > 85.0 85.7 85.7  PLT 239.0 225  < > 161 149* 158  < > = values in this interval not displayed. Cardiac Enzymes:  Recent Labs  06/24/14 1445 06/24/14 2031 06/25/14 0201  TROPONINI 0.03 0.04* 0.04*   BNP: Invalid input(s): POCBNP CBG:  Recent Labs  06/16/14 2225  GLUCAP 95    Radiological Exams: Ct Abdomen Pelvis  Wo Contrast  05/04/2015  CLINICAL DATA:  81 year old male with history of dyspnea and weight loss. EXAM: CT ABDOMEN AND PELVIS WITHOUT CONTRAST TECHNIQUE: Multidetector CT imaging of the abdomen and pelvis was performed following the standard protocol without IV contrast. COMPARISON:  CT the abdomen and pelvis 01/21/2015. FINDINGS: Lower chest: Small left and small to moderate right pleural effusions. Some areas of passive subsegmental  atelectasis are noted in lung bases bilaterally. In addition, there is extensive bronchial wall thickening and thickening of the peribronchovascular interstitium, most pronounced in the right lower lobe where there is some associated peribronchovascular ground-glass attenuation. Cardiomegaly. Atherosclerotic calcifications in the left anterior descending, left circumflex and right coronary arteries. Pacemaker leads terminating in the right atrium and right ventricular apex. Hepatobiliary: No discrete cystic or solid hepatic lesions are confidently identified on today's noncontrast CT examination. Status post cholecystectomy. Capsular calcification along the posterior surface of the right lobe of the liver is unchanged. Pancreas: No discrete pancreatic mass or peripancreatic inflammatory changes noted on today's noncontrast CT examination. Spleen: Unremarkable. Adrenals/Urinary Tract: Severe atrophy of the left kidney. Multiple low-attenuation lesions associated with the left kidney appears similar to the prior study from 01/21/2015, measuring up to 4.2 cm in the interpolar region, incompletely characterized on today's noncontrast CT examination, but previously characterized as cysts. Multiple other smaller low-attenuation lesions in the right kidney are incompletely characterized but similar to the prior examination. Vascular calcifications in both renal hila. No hydroureteronephrosis. Unenhanced appearance of the urinary bladder is unremarkable. Bilateral adrenal glands are normal in appearance. Stomach/Bowel: Unenhanced appearance of the stomach is normal. No pathologic dilatation of small bowel or colon. Vascular/Lymphatic: Extensive atherosclerotic calcifications are identified throughout the abdominal and pelvic vasculature. Again noted is a focal outpouching of the left wall of the aorta near the origin of the left renal artery which measures up to 17 mm in diameter, which is similar to prior examinations.  Enlarged right inguinal lymph node measuring 2.2 cm in short axis (image 79 of series 2). No other definite lymphadenopathy confidently identified in the abdomen or pelvis on today's noncontrast CT examination. Reproductive: Prostate gland and seminal vesicles are unremarkable in appearance. Other: Small to moderate volume of ascites. Mild diffuse mesenteric edema. No pneumoperitoneum. Musculoskeletal: Diffuse body wall edema. There are no aggressive appearing lytic or blastic lesions noted in the visualized portions of the skeleton. Old compression fractures of superior endplate of L1 and inferior endplate of L3 are again noted, both of which demonstrate approximately 30% loss of anterior vertebral body height. Bilateral pars defects at L5. 4 mm of anterolisthesis of L5 upon S1. There are no aggressive appearing lytic or blastic lesions noted in the visualized portions of the skeleton. IMPRESSION: 1. The appearance of the abdomen and pelvis suggest a state of anasarca, as evidenced by edematous changes in the visualized lung bases, bilateral pleural effusions, small to moderate volume of ascites, diffuse mesenteric edema and diffuse body wall edema. 2. No definite acute findings noted in the abdomen or pelvis on today's noncontrast CT examination. 3. Extensive atherosclerosis, including multivessel coronary artery disease. 4. Additional incidental findings, as above. Electronically Signed   By: Vinnie Langton M.D.   On: 05/04/2015 17:43   Dg Chest 2 View  05/04/2015  CLINICAL DATA:  Shortness of Breath EXAM: CHEST - 2 VIEW COMPARISON:  04/21/2015 FINDINGS: Cardiac shadow remains mildly enlarged. Postsurgical changes are again seen as is a pacing device. Increased vascular congestion and interstitial edema is noted. Some increased density  in the right lung base is seen which may represent some superimposed infiltrate. There remains thickening of the minor and major fissures on right. IMPRESSION: Changes  consistent with CHF and likely mild early basilar infiltrate on the right. Electronically Signed   By: Inez Catalina M.D.   On: 05/04/2015 16:58     Assessment/Plan  Physical deconditioning Will have him work with physical therapy and occupational therapy team to help with gait training and muscle strengthening exercises.fall precautions. Skin care. Encourage to be out of bed.   CHF With recent exacerbation. Continue lasix 80 mg bid, bisoprolol 2.5 mg daily with holding parameters. Check weight. Check VS bid for now. Check bmp  Dyspnea Deconditioning along with his CHF and COPD could be contributing to this. Add o2 by nasal canula with exertion for now. No signs of fluid overload or copd exacerbation at present. If dyspnea persists, will get CXR. Will schedule duoneb for next 5 days   COPD Continue dulera, spiriva and to add o2 as above. duoneb tid x 5 days then prn.  Anemia of chronic disease Monitor cbc  ckd  impaired renal function. Monitor bmp  Protein calorie malnutrition Monitor po intake. Continue feeding supplement. Dietary consult  HLD Continue rosuvastatin 20 mg daily  Constipation Continue miralax daily  Insomnia Continue trazodone 25 mg qhs and add melatonin 6 mg qhs and monitor  Skin tear To his arms, continue skin care, clean with NS and do collagen with silicone dressing [id number]  afib Rate controlled. Continue bisoprolol- check VS bid for now. Continue coumadin for anticoagulation  Long term anticoagulation inr 2.7 on 05/13/15. Continue coumadin and check inr 05/18/15   Goals of care: short term rehabilitation   Labs/tests ordered: cbc, bmp  Family/ staff Communication: reviewed care plan with patient and nursing supervisor    Blanchie Serve, MD  [place] [phone] (Monday-Friday 8 am - 5 pm) [phone] (afterhours)

## 2015-05-19 ENCOUNTER — Ambulatory Visit: Payer: Medicare Other | Admitting: Internal Medicine

## 2015-05-28 ENCOUNTER — Non-Acute Institutional Stay (SKILLED_NURSING_FACILITY): Payer: Medicare Other | Admitting: Internal Medicine

## 2015-05-28 DIAGNOSIS — I959 Hypotension, unspecified: Secondary | ICD-10-CM | POA: Diagnosis not present

## 2015-05-28 DIAGNOSIS — I4819 Other persistent atrial fibrillation: Secondary | ICD-10-CM

## 2015-05-28 DIAGNOSIS — R509 Fever, unspecified: Secondary | ICD-10-CM

## 2015-05-28 DIAGNOSIS — E861 Hypovolemia: Secondary | ICD-10-CM

## 2015-05-28 DIAGNOSIS — N183 Chronic kidney disease, stage 3 unspecified: Secondary | ICD-10-CM

## 2015-05-28 DIAGNOSIS — F05 Delirium due to known physiological condition: Secondary | ICD-10-CM | POA: Diagnosis not present

## 2015-05-28 DIAGNOSIS — E860 Dehydration: Secondary | ICD-10-CM | POA: Diagnosis not present

## 2015-05-28 DIAGNOSIS — I5042 Chronic combined systolic (congestive) and diastolic (congestive) heart failure: Secondary | ICD-10-CM

## 2015-05-28 DIAGNOSIS — I481 Persistent atrial fibrillation: Secondary | ICD-10-CM | POA: Diagnosis not present

## 2015-05-28 NOTE — Progress Notes (Signed)
Patient ID: Edward [name], male   DOB: Jan 24, 1930, 81 y.o.   MRN: [id number]    Facility: [facility]   Chief Complaint  Patient presents with  . Acute Visit    confused, poor po intake   Allergies  Allergen Reactions  . Ace Inhibitors Cough  . Clarithromycin Other (See Comments)    Strange thoughts and fell with biaxin  . Codeine Nausea And Vomiting  . Oxycodone-Acetaminophen Other (See Comments)    Felt closed in  . Viberzi [Eluxadoline] Other (See Comments)    Severe cramps and impaction   Code status: full code  HPI 81 y/o male patient seen for acute concerns. Staff have noticed him to be more lethargic overnight and has been mostly in bed. He has not eaten his breakfast this am. When seen in the room he complaints of being thirsty and wants some buttermilk. When provided, he finishes a glass of buttermilk in few seconds. He is confused and is unable to participate much in HPI and ROS. Has low grade temperature. BP reading on review is low.  ROS Denies chills.  Denies headache and dizziness Denies chest pain or trouble breathing. Currently on o2 Denies abdominal pain, nausea or vomiting Denies pain On review of chart is on bisoprolol and lasix bid  Past Medical History  Diagnosis Date  . Gout     "only once in my lifetime" (12/18/2012)  . Depression   . PVD (peripheral vascular disease) ([place])     s/p B CEA  . COPD (chronic obstructive pulmonary disease) ([place])      PFT 6.19.07: FEV1 465  ratio 60 with 25% response to B2.  > PFTs 8.29.07: FEV1 61%  ratio 46% no better after B2.  > add on advair 12.20.11-improved 1.31.12  . AAA (abdominal aortic aneurysm) ([place])   . Melanosis coli   . Internal hemorrhoids   . Carotid artery occlusion   . Aortic valve disorder   . Chronic combined systolic and diastolic heart failure ([place])   . CAD (coronary artery disease), native coronary artery     CABG w LIMA to LAD, SVG to dx, OM, RCA 1989 Dr. Arlyce Dice  for 3VD PTCA of OM, 1999 and 2000 Cath showed occlusion of left main and RCA with stenosis in OM Redo redo CABG w SVG to OM, SVG to RCA8/10/00 Dr. Cyndia Bent   . Hyperlipidemia   . BPH (benign prostatic hypertrophy)   . Hypertensive heart disease     Change toprol to bisoprolol 10 mg daily on trial basis  05/21/2012  - notified by [facility] non adherent 07/17/2012    . LBBB (left bundle branch block)   . Second degree heart block 12/18/2012    s/p MDT Adapta L pacemaker 12-20-2012 by Dr Caryl Comes  . Shingles     mild  . Chronic kidney disease stage III (GFR 30-59 ml/min)     saw dr Justin Mend oct 2015 and released by dr webb  . Congenital absence of kidney     "noted during AAA repair; never knew it before" (12/18/2012)  . Myocardial infarction ([place]) 1979    times 2  . Presence of permanent cardiac pacemaker   . 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  Always use your most recent med list.  bisoprolol 5 MG tablet  Commonly known as:  ZEBETA  Take 0.5 tablets (2.5 mg total) by mouth daily.     DULERA 100-5 MCG/ACT Aero  Generic drug:  mometasone-formoterol  Inhale 2 puffs into the lungs 2 (two) times daily.     furosemide 80 MG tablet  Commonly known as:  LASIX  Take 1 tablet (80 mg total) by mouth 2 (two) times daily. Take only if weight increases and needed to control edema and dyspnea     nitroGLYCERIN 0.4 MG SL tablet  Commonly known as:  NITROSTAT  Place 0.4 mg under the tongue every 5 (five) minutes as needed for chest pain.     polyethylene glycol packet  Commonly known as:  MIRALAX / GLYCOLAX  Take 17 g by mouth daily.     tiotropium 18 MCG inhalation capsule  Commonly known as:  SPIRIVA  Place 1 capsule (18 mcg total) into inhaler and inhale daily.     UNABLE TO FIND  Med Name: Med Pass 120 cc by mouth twice daily for malnutrition     warfarin 1 MG tablet  Commonly known as:  COUMADIN  Take  0.5 tablets (0.5 mg total) by mouth daily at 6 PM.        Physical exam BP 88/44 mmHg  Pulse 74  Temp(Src) 100.5 F (38.1 C)  Resp 18  Wt 121 lb 12.8 oz (55.248 kg)  SpO2 93%  Wt Readings from Last 3 Encounters:  05/28/15 121 lb 12.8 oz (55.248 kg)  05/14/15 135 lb (61.236 kg)  05/12/15 126 lb 6.4 oz (57.335 kg)   General- elderly thin built male in no acute distress Head- atraumatic, normocephalic, dry mucus membrane Eyes- PERRLA, EOMI, no pallor, no icterus Neck- no lymphadenopathy, no jugular vein distension Cardiovascular- irregular heart rate Respiratory- bilateral clear to auscultation, no wheeze, no rhonchi, no crackles Abdomen- bowel sounds present, soft, non tender Musculoskeletal- able to move all 4 extremities, generalized weakness, 1 + leg pitting edema Neurology- pleasantly confused, calm affect    Labs CBC Latest Ref Rng 05/10/2015 05/07/2015 05/04/2015  WBC 4.0 - 10.5 K/uL 4.3 5.1 5.5  Hemoglobin 13.0 - 17.0 g/dL 10.3(L) 10.5(L) 10.3(L)  Hematocrit 39.0 - 52.0 % 31.8(L) 33.0(L) 32.3(L)  Platelets 150 - 400 K/uL 158 149(L) 161   CMP Latest Ref Rng 05/12/2015 05/11/2015 05/10/2015  Glucose 65 - 99 mg/dL 88 70 78  BUN 6 - 20 mg/dL 53(H) 54(H) 53(H)  Creatinine 0.61 - 1.24 mg/dL 2.39(H) 2.39(H) 2.35(H)  Sodium 135 - 145 mmol/L 141 138 139  Potassium 3.5 - 5.1 mmol/L 3.8 4.8 4.1  Chloride 101 - 111 mmol/L 101 101 99(L)  CO2 22 - 32 mmol/L 32 25 31  Calcium 8.9 - 10.3 mg/dL 8.4(L) 8.6(L) 8.6(L)  Total Protein 6.5 - 8.1 g/dL - - -  Total Bilirubin 0.3 - 1.2 mg/dL - - -  Alkaline Phos 38 - 126 U/L - - -  AST 15 - 41 U/L - - -  ALT 17 - 63 U/L - - -    Assessment/plan  Hypotension BP Readings from Last 3 Encounters:  05/28/15 88/44  05/12/15 84/56  04/16/15 90/60  BP readings have been on lower side on review. He is on bisoprolol and lasix. Dry mucus membrane on exam and patient has been thirsty. Concern for hypovolemia. Will get stat cbc and cmp to  ruled out acute anemia and worsening renal function which could contribute to hypotension. Start iv fluids D5 half NS @  100 cc/hr for 1 litre and reassess. Will also decrease his lasix to 40 mg daily for now with holding parameter. Check VS q shift for now  Hypovolemia Starting iv fluids but need to be monitored closely for fluid overload and pulmonary edema given hx of chf and recent hospital admission with exacerbation. Encourage po intake and to provide assistance with feed.   Fever Give tylenol x 1 now. Send cbc with diff, cmp to assess for infection. Clear lung exam. Send u/a with c/s. Monitor temp curve  ckd stage 3 Has been on lasix 80 mg bid which could worsen this, check stat bmp and decrease dose of lasix as above  CHF Currently appears dehydrated. Will give iv fluids but will need to monitor him closely for signs of fluid overload and to avoid pulmonary edema. Continue o2. Weight has been stable in facility and lower than on admission to the facility.   afib Rate controlled. Add holding parameter to zebeta and continue coumadin, check inr today  Confusion Change of mental status- getting stat labs to assess for acute abnormality as above. To provide complete assistance with feeding for now until patient returns to his baseline and encourage hydration with aspiration precautions  Reviewed care plan with nursing supervisor. Spent more than 35 minutes in patient care  Blanchie Serve, MD  [facility] [phone] (Monday-Friday 8 am - 5 pm) [phone] (afterhours)

## 2015-05-29 ENCOUNTER — Encounter (HOSPITAL_COMMUNITY): Payer: Self-pay | Admitting: Emergency Medicine

## 2015-05-29 ENCOUNTER — Emergency Department (HOSPITAL_COMMUNITY): Payer: Medicare Other

## 2015-05-29 ENCOUNTER — Inpatient Hospital Stay (HOSPITAL_COMMUNITY)
Admission: EM | Admit: 2015-05-29 | Discharge: 2015-06-01 | DRG: 193 | Disposition: A | Discharge status: Hospice | Payer: Medicare Other | Attending: Internal Medicine | Admitting: Internal Medicine

## 2015-05-29 DIAGNOSIS — I5042 Chronic combined systolic (congestive) and diastolic (congestive) heart failure: Secondary | ICD-10-CM | POA: Diagnosis present

## 2015-05-29 DIAGNOSIS — Z8249 Family history of ischemic heart disease and other diseases of the circulatory system: Secondary | ICD-10-CM | POA: Diagnosis not present

## 2015-05-29 DIAGNOSIS — Z79899 Other long term (current) drug therapy: Secondary | ICD-10-CM

## 2015-05-29 DIAGNOSIS — Y95 Nosocomial condition: Secondary | ICD-10-CM | POA: Diagnosis present

## 2015-05-29 DIAGNOSIS — Z8679 Personal history of other diseases of the circulatory system: Secondary | ICD-10-CM | POA: Diagnosis not present

## 2015-05-29 DIAGNOSIS — E43 Unspecified severe protein-calorie malnutrition: Secondary | ICD-10-CM | POA: Diagnosis present

## 2015-05-29 DIAGNOSIS — N184 Chronic kidney disease, stage 4 (severe): Secondary | ICD-10-CM | POA: Diagnosis present

## 2015-05-29 DIAGNOSIS — J154 Pneumonia due to other streptococci: Secondary | ICD-10-CM | POA: Diagnosis present

## 2015-05-29 DIAGNOSIS — J9601 Acute respiratory failure with hypoxia: Secondary | ICD-10-CM | POA: Diagnosis not present

## 2015-05-29 DIAGNOSIS — Z906 Acquired absence of other parts of urinary tract: Secondary | ICD-10-CM | POA: Diagnosis not present

## 2015-05-29 DIAGNOSIS — Z66 Do not resuscitate: Secondary | ICD-10-CM | POA: Diagnosis present

## 2015-05-29 DIAGNOSIS — B3749 Other urogenital candidiasis: Secondary | ICD-10-CM | POA: Diagnosis present

## 2015-05-29 DIAGNOSIS — C679 Malignant neoplasm of bladder, unspecified: Secondary | ICD-10-CM | POA: Diagnosis present

## 2015-05-29 DIAGNOSIS — I2510 Atherosclerotic heart disease of native coronary artery without angina pectoris: Secondary | ICD-10-CM | POA: Diagnosis present

## 2015-05-29 DIAGNOSIS — Z515 Encounter for palliative care: Secondary | ICD-10-CM | POA: Insufficient documentation

## 2015-05-29 DIAGNOSIS — D696 Thrombocytopenia, unspecified: Secondary | ICD-10-CM | POA: Diagnosis present

## 2015-05-29 DIAGNOSIS — Z681 Body mass index (BMI) 19 or less, adult: Secondary | ICD-10-CM | POA: Diagnosis not present

## 2015-05-29 DIAGNOSIS — Z951 Presence of aortocoronary bypass graft: Secondary | ICD-10-CM | POA: Diagnosis not present

## 2015-05-29 DIAGNOSIS — I130 Hypertensive heart and chronic kidney disease with heart failure and stage 1 through stage 4 chronic kidney disease, or unspecified chronic kidney disease: Secondary | ICD-10-CM | POA: Diagnosis present

## 2015-05-29 DIAGNOSIS — I959 Hypotension, unspecified: Secondary | ICD-10-CM | POA: Diagnosis present

## 2015-05-29 DIAGNOSIS — Z87891 Personal history of nicotine dependence: Secondary | ICD-10-CM | POA: Diagnosis not present

## 2015-05-29 DIAGNOSIS — I4891 Unspecified atrial fibrillation: Secondary | ICD-10-CM | POA: Diagnosis present

## 2015-05-29 DIAGNOSIS — Z9049 Acquired absence of other specified parts of digestive tract: Secondary | ICD-10-CM

## 2015-05-29 DIAGNOSIS — Z7901 Long term (current) use of anticoagulants: Secondary | ICD-10-CM | POA: Diagnosis not present

## 2015-05-29 DIAGNOSIS — Z888 Allergy status to other drugs, medicaments and biological substances status: Secondary | ICD-10-CM

## 2015-05-29 DIAGNOSIS — Z955 Presence of coronary angioplasty implant and graft: Secondary | ICD-10-CM

## 2015-05-29 DIAGNOSIS — I482 Chronic atrial fibrillation: Secondary | ICD-10-CM | POA: Diagnosis not present

## 2015-05-29 DIAGNOSIS — Z881 Allergy status to other antibiotic agents status: Secondary | ICD-10-CM | POA: Diagnosis not present

## 2015-05-29 DIAGNOSIS — D631 Anemia in chronic kidney disease: Secondary | ICD-10-CM | POA: Diagnosis present

## 2015-05-29 DIAGNOSIS — Z885 Allergy status to narcotic agent status: Secondary | ICD-10-CM

## 2015-05-29 DIAGNOSIS — Z950 Presence of cardiac pacemaker: Secondary | ICD-10-CM | POA: Diagnosis not present

## 2015-05-29 DIAGNOSIS — J189 Pneumonia, unspecified organism: Secondary | ICD-10-CM | POA: Diagnosis present

## 2015-05-29 LAB — BASIC METABOLIC PANEL
ANION GAP: 10 (ref 5–15)
BUN: 58 mg/dL — ABNORMAL HIGH (ref 6–20)
CHLORIDE: 95 mmol/L — AB (ref 101–111)
CO2: 28 mmol/L (ref 22–32)
Calcium: 8.3 mg/dL — ABNORMAL LOW (ref 8.9–10.3)
Creatinine, Ser: 2.19 mg/dL — ABNORMAL HIGH (ref 0.61–1.24)
GFR calc non Af Amer: 26 mL/min — ABNORMAL LOW (ref >60)
GFR, EST AFRICAN AMERICAN: 30 mL/min — AB (ref >60)
GLUCOSE: 168 mg/dL — AB (ref 65–99)
Potassium: 3.6 mmol/L (ref 3.5–5.1)
Sodium: 133 mmol/L — ABNORMAL LOW (ref 135–145)

## 2015-05-29 LAB — CBC
HEMATOCRIT: 30.3 % — AB (ref 39.0–52.0)
HEMOGLOBIN: 9.9 g/dL — AB (ref 13.0–17.0)
MCH: 28.3 pg (ref 26.0–34.0)
MCHC: 32.7 g/dL (ref 30.0–36.0)
MCV: 86.6 fL (ref 78.0–100.0)
Platelets: 148 10*3/uL — ABNORMAL LOW (ref 150–400)
RBC: 3.5 MIL/uL — AB (ref 4.22–5.81)
RDW: 18.6 % — ABNORMAL HIGH (ref 11.5–15.5)
WBC: 6.8 10*3/uL (ref 4.0–10.5)

## 2015-05-29 LAB — URINALYSIS, ROUTINE W REFLEX MICROSCOPIC
BILIRUBIN URINE: NEGATIVE
GLUCOSE, UA: NEGATIVE mg/dL
KETONES UR: NEGATIVE mg/dL
Nitrite: NEGATIVE
PH: 5.5 (ref 5.0–8.0)
Protein, ur: NEGATIVE mg/dL
SPECIFIC GRAVITY, URINE: 1.012 (ref 1.005–1.030)

## 2015-05-29 LAB — URINE MICROSCOPIC-ADD ON
RBC / HPF: NONE SEEN RBC/hpf (ref 0–5)
SQUAMOUS EPITHELIAL / LPF: NONE SEEN

## 2015-05-29 LAB — PROTIME-INR
INR: 1.48 (ref 0.00–1.49)
Prothrombin Time: 18 seconds — ABNORMAL HIGH (ref 11.6–15.2)

## 2015-05-29 LAB — I-STAT CG4 LACTIC ACID, ED: Lactic Acid, Venous: 1.98 mmol/L (ref 0.5–2.0)

## 2015-05-29 MED ORDER — VANCOMYCIN HCL IN DEXTROSE 1-5 GM/200ML-% IV SOLN
1000.0000 mg | Freq: Once | INTRAVENOUS | Status: DC
  Administered 2015-05-29: 1000 mg via INTRAVENOUS
  Filled 2015-05-29: qty 200

## 2015-05-29 MED ORDER — FLUCONAZOLE IN SODIUM CHLORIDE 100-0.9 MG/50ML-% IV SOLN
100.0000 mg | INTRAVENOUS | Status: DC
  Filled 2015-05-29: qty 50

## 2015-05-29 MED ORDER — FLUCONAZOLE IN SODIUM CHLORIDE 100-0.9 MG/50ML-% IV SOLN
100.0000 mg | INTRAVENOUS | Status: AC
  Administered 2015-05-29: 100 mg via INTRAVENOUS
  Filled 2015-05-29: qty 50

## 2015-05-29 MED ORDER — FLUCONAZOLE IN SODIUM CHLORIDE 100-0.9 MG/50ML-% IV SOLN: 50.0000 mg | INTRAVENOUS | Status: DC

## 2015-05-29 MED ORDER — WARFARIN - PHARMACIST DOSING INPATIENT: Freq: Every day | Status: DC

## 2015-05-29 MED ORDER — DEXTROSE 5 % IV SOLN
1.0000 g | INTRAVENOUS | Status: DC
  Filled 2015-05-29: qty 1

## 2015-05-29 MED ORDER — SODIUM CHLORIDE 0.9 % IV SOLN
INTRAVENOUS | Status: DC
  Administered 2015-05-29: 16:00:00 via INTRAVENOUS

## 2015-05-29 MED ORDER — PIPERACILLIN-TAZOBACTAM 3.375 G IVPB 30 MIN
3.3750 g | Freq: Once | INTRAVENOUS | Status: DC
  Administered 2015-05-29: 3.375 g via INTRAVENOUS
  Filled 2015-05-29: qty 50

## 2015-05-29 MED ORDER — SODIUM CHLORIDE 0.9 % IV SOLN
500.0000 mg | INTRAVENOUS | Status: DC
  Administered 2015-05-30 – 2015-05-31 (×2): 500 mg via INTRAVENOUS
  Filled 2015-05-29 (×3): qty 500

## 2015-05-29 MED ORDER — SODIUM CHLORIDE 0.9 % IV BOLUS (SEPSIS)
250.0000 mL | Freq: Once | INTRAVENOUS | Status: AC
  Administered 2015-05-29: 250 mL via INTRAVENOUS

## 2015-05-29 MED ORDER — SODIUM CHLORIDE 0.9 % IV SOLN
INTRAVENOUS | Status: DC
Start: 2015-05-29 — End: 2015-06-01
  Administered 2015-05-30 – 2015-05-31 (×2): via INTRAVENOUS

## 2015-05-29 MED ORDER — DEXTROSE 5 % IV SOLN
1.0000 g | INTRAVENOUS | Status: DC
  Administered 2015-05-29 – 2015-05-31 (×3): 1 g via INTRAVENOUS
  Filled 2015-05-29 (×5): qty 1

## 2015-05-29 MED ORDER — WARFARIN SODIUM 2 MG PO TABS: 2.0000 mg | ORAL_TABLET | Freq: Every day | ORAL | Status: DC

## 2015-05-29 MED ORDER — SODIUM CHLORIDE 0.9 % IV SOLN
INTRAVENOUS | Status: DC
  Administered 2015-05-29: 17:00:00 via INTRAVENOUS

## 2015-05-29 MED ORDER — WARFARIN SODIUM 4 MG PO TABS
4.0000 mg | ORAL_TABLET | Freq: Once | ORAL | Status: AC
  Administered 2015-05-29: 4 mg via ORAL
  Filled 2015-05-29: qty 1

## 2015-05-29 NOTE — Progress Notes (Signed)
Per telemetry patient HR is in flutter.  Upon arrival from the ED lood pressure is 84/57. MD made aware per protocol.  Will continue to monitor closely.

## 2015-05-29 NOTE — Progress Notes (Addendum)
ANTIBIOTIC CONSULT NOTE - INITIAL  Pharmacy Consult for Vancomycin, may adjust Cefepime Indication: pneumonia  Allergies  Allergen Reactions  . Ace Inhibitors Cough  . Clarithromycin Other (See Comments)    Strange thoughts and fell with biaxin  . Codeine Nausea And Vomiting  . Oxycodone-Acetaminophen Other (See Comments)    Felt closed in  . Viberzi [Eluxadoline] Other (See Comments)    Severe cramps and impaction   Patient Measurements:   Total body weight: 55.2kg  Vital Signs: Temp: 98.2 F (36.8 C) (12/30 1310) Temp Source: Oral (12/30 1310) BP: 98/68 mmHg (12/30 1500) Pulse Rate: 78 (12/30 1500) Intake/Output from previous day:   Intake/Output from this shift:    Labs:  Recent Labs  05/29/15 1326  WBC 6.8  HGB 9.9*  PLT 148*  CREATININE 2.19*   Estimated Creatinine Clearance: 20 mL/min (by C-G formula based on Cr of 2.19). No results for input(s): VANCOTROUGH, VANCOPEAK, VANCORANDOM, GENTTROUGH, GENTPEAK, GENTRANDOM, TOBRATROUGH, TOBRAPEAK, TOBRARND, AMIKACINPEAK, AMIKACINTROU, AMIKACIN in the last 72 hours.   Microbiology: No results found for this or any previous visit (from the past 720 hour(s)).  Medical History: Past Medical History  Diagnosis Date  . Gout     "only once in my lifetime" (12/18/2012)  . Depression   . PVD (peripheral vascular disease) ([place])     s/p B CEA  . COPD (chronic obstructive pulmonary disease) ([place])      PFT 6.19.07: FEV1 465  ratio 60 with 25% response to B2.  > PFTs 8.29.07: FEV1 61%  ratio 46% no better after B2.  > add on advair 12.20.11-improved 1.31.12  . AAA (abdominal aortic aneurysm) ([place])   . Melanosis coli   . Internal hemorrhoids   . Carotid artery occlusion   . Aortic valve disorder   . Chronic combined systolic and diastolic heart failure ([place])   . CAD (coronary artery disease), native coronary artery     CABG w LIMA to LAD, SVG to dx, OM, RCA 1989 Dr. Arlyce Dice for 3VD PTCA of OM, 1999 and 2000 Cath showed  occlusion of left main and RCA with stenosis in OM Redo redo CABG w SVG to OM, SVG to RCA8/10/00 Dr. Cyndia Bent   . Hyperlipidemia   . BPH (benign prostatic hypertrophy)   . Hypertensive heart disease     Change toprol to bisoprolol 10 mg daily on trial basis  05/21/2012  - notified by [facility] non adherent 07/17/2012    . LBBB (left bundle branch block)   . Second degree heart block 12/18/2012    s/p MDT Adapta L pacemaker 12-20-2012 by Dr Caryl Comes  . Shingles     mild  . Chronic kidney disease stage III (GFR 30-59 ml/min)     saw dr Justin Mend oct 2015 and released by dr webb  . Congenital absence of kidney     "noted during AAA repair; never knew it before" (12/18/2012)  . Myocardial infarction ([place]) 1979    times 2  . Presence of permanent cardiac pacemaker   . 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 CXray on admit: new R base infiltrate, effusion. Admit for Vancomycin, Cefepime; Pharmacy to dose. On Warfarin 2mg  daily PTA, dose increased from usual dose 0.5mg  daily, Pharmacy to dose.  Goal of Therapy:  Vancomycin trough level 15-20 mcg/ml  Plan:   Vancomycin  1gm x1 in ED, followed by 500mg  q24 x 7 more doses  Cefepime 1gm q24 x 8 doses  Fluconazole for Yeast noted in U/A, 100mg  q24 x 5 days, dose appropriate  Planned blood, urine, sputum cultures  Minda Ditto PharmD Pager [phone] 05/29/2015, 4:56 PM

## 2015-05-29 NOTE — ED Notes (Signed)
Bed: WA08 Expected date:  Expected time:  Means of arrival:  Comments: EMS 

## 2015-05-29 NOTE — ED Notes (Signed)
16:20 pt can go to floor.

## 2015-05-29 NOTE — Progress Notes (Signed)
Utilization Review completed.  France Noyce RN CM  

## 2015-05-29 NOTE — H&P (Addendum)
[facility] History and Physical  Eitan Schoenwetter Mandel [id number] DOB: 21-Dec-1929 DOA: 05/29/2015  Referring physician: ER physician: Dr. Davonna Belling PCP: Gwendolyn Grant, MD  Chief Complaint:   HPI:  81 year old male with past medical history including but not limited to coronary artery disease status post bypass grafting and permanent pacemaker, atrial fibrillation on anticoagulation with Coumadin, chronic combined systolic and diastolic CHF, history of bladder tumor status post TURBT in 03/2015, chronic kidney disease stage IV who presented to [facility] from nursing home because of hypotension. Patient went to nursing home from his recent hospitalization 05/12/2015. During that hospitalization cardiology has seen him in consultation and patient was not considered to be candidate for biventricular pacemaking, his pacemaker was reprogrammed in cardiology at that time recommended no further invasive cardiac treatments. He was also recommended that he follows with hospice on outpatient basis. Patient had no reports of shortness of breath or chest pain in a nursing home. No abdominal pain. No nausea or vomiting. He is chronically weak and has poor by mouth intake. No reports of lightheadedness or falls or loss of consciousness.  In ED, blood pressure was 88/44, temp was 100.5 F. blood work demonstrated hemoglobin of 9.9, platelets 148, creatinine 2.19 (baseline creatinine is 2.43). His oxygen saturation was 93% with nasal cannula oxygen. Chest x-ray demonstrated right basilar infiltrate concerning for pneumonia and because of recent hospitalization he was started on broad-spectrum antibiotics, vancomycin and cefepime. Palliative care was consulted to address goals of care.  Assessment & Plan    Principal Problem:   Acute respiratory failure with hypoxemia (HCC) / HCAP (healthcare-associated pneumonia) - Oxygen saturation 93% with [place] oxygen support - Hypoxia likely from  pneumonia - CXR on admission showed right basilar infiltrate - Pneumonia order set placed - Started on vanco and cefepime considering patient was recently hospitalized - Follow up blood cultures, resp cultures, legionella and strep pneumonia results - Continue oxygen support via [place] to keep O2 saturation above 90% - Admission to telemetry   Active Problems:   Yeast UTI - Yeast seen on UTI - Started empiric fluconazole until urine culture results are back    CAD (coronary artery disease), native coronary artery without angina pectoris  - Stable     Atrial fibrillation (HCC) / Long-term (current) use of anticoagulants - CHADS vasc score 3 (age, hypertension, chf) - On anticoagulation with coumadin, per pharmacy dosing - Rate is controlled with bisoprolol    Cancer of bladder wall ([place]) - Has had repeat TURBT in 03/2015 by Dr. Diona Fanti     Chronic combined systolic and diastolic heart failure ([place]) - Last 2  DE CHO 05/04/2015 with EF 15% - Lasix on hold due to hypotension     Protein-calorie malnutrition, severe - In the context of chronic illness - Nutrition consulted     Palliative care encounter - Appreciate palliative care consult for goals of care    CKD (chronic kidney disease) stage 4, GFR 15-29 ml/min (HCC) - Baseline Cr 2.43 about 2 years ago - Cr on this admission 2.19, at baseline values     Thrombocytopenia (HCC) - Monitor while pt on coumadin - No reports of bleeding     Anemia of chronic renal failure, stage 4 (severe) (HCC) - Hemoglobin stable at 9.9  DVT prophylaxis:  - On anticoagulation with coumadin   Radiological Exams on Admission: Dg Chest 2 View  05/29/2015  New right basilar infiltrate and associated effusion. Superimposed mild interstitial changes stable from the prior  exam. Electronically Signed   By: Inez Catalina M.D.   On: 05/29/2015 13:57     Code Status: Full Family Communication: Plan of care discussed with the patient  Disposition  Plan: Admit for further evaluation, telemetry   Leisa Lenz, MD  [facility] Pager [phone]  Time spent in minutes: 75 minutes  Review of Systems:  Constitutional: Negative for fever, chills and malaise/fatigue. Negative for diaphoresis.  HENT: Negative for hearing loss, ear pain, nosebleeds, congestion, sore throat, neck pain, tinnitus and ear discharge.   Eyes: Negative for blurred vision, double vision, photophobia, pain, discharge and redness.  Respiratory: Negative for cough, hemoptysis, sputum production, shortness of breath, wheezing and stridor.   Cardiovascular: Negative for chest pain, palpitations, orthopnea, claudication and leg swelling.  Gastrointestinal: Negative for nausea, vomiting and abdominal pain. Negative for heartburn, constipation, blood in stool and melena.  Genitourinary: Negative for dysuria, urgency, frequency, hematuria and flank pain.  Musculoskeletal: Negative for myalgias, back pain, joint pain and falls.  Skin: Negative for itching and rash.  Neurological: Negative for dizziness and weakness. Negative for tingling, tremors, sensory change, speech change, focal weakness, loss of consciousness and headaches.  Endo/Heme/Allergies: Negative for environmental allergies and polydipsia. Does not bruise/bleed easily.  Psychiatric/Behavioral: Negative for suicidal ideas. The patient is not nervous/anxious.      History reviewed. No pertinent past medical history. Past Surgical History  Procedure Laterality Date  . Cholecystectomy  1998  . Hemiarthroplasty shoulder fracture Right ~ 2008     x 2 - [place]  . Carotid endarterectomy Bilateral 1990's  . Abdominal aortic aneurysm repair  1998  . Carotid-subclavian bypass graft Left 1990's  . Anal fissure repair      12/18/2012 "I don't remember this"  . Hemorroidectomy      12/18/2012 "I don't remember this"  . Carpal tunnel release Right 1980's    "Dr. Marrian Salvage" (12/18/2012)  . Cardiac  catheterization      "2 or 3" (12/18/2012)  . Coronary angioplasty with stent placement      "I've got 1 or 2" (12/18/2012)  . Hip fracture surgery  1944    "fell out of a tree; had it operated on 3 times" (12/18/2012)  . Refractive surgery Bilateral 1990's?  . Pacemaker insertion  12-20-2012    dual chamber Medtronic Adapta L pacemaker implanted by Dr Caryl Comes  . Cardioversion N/A 12/30/2013    Procedure: CARDIOVERSION;  Surgeon: Jacolyn Reedy, MD;  Location: [place];  Service: Cardiovascular;  Laterality: N/A;  . Permanent pacemaker insertion N/A 12/20/2012    Procedure: PERMANENT PACEMAKER INSERTION;  Surgeon: Deboraha Sprang, MD;  Location: [facility] CATH LAB;  Service: Cardiovascular;  Laterality: N/A;  . Coronary artery bypass graft  1989, 2000     "CABG X ?3; CABG X 5 w//B[place]le" (12/18/2012)  . Transurethral resection of bladder tumor N/A 06/16/2014    Procedure: TRANSURETHRAL RESECTION OF BLADDER TUMOR (TURBT) ;  Surgeon: Jorja Loa, MD;  Location: [facility];  Service: Urology;  Laterality: N/A;  . Cardioversion N/A 09/26/2014    Procedure: CARDIOVERSION;  Surgeon: Jacolyn Reedy, MD;  Location: [place];  Service: Cardiovascular;  Laterality: N/A;  . Eye surgery      bil catarcts with lens implants on right eye  . Transurethral resection of bladder tumor N/A 04/02/2015    Procedure: TRANSURETHRAL RESECTION OF BLADDER TUMOR (TURBT);  Surgeon: Franchot Gallo, MD;  Location: [facility];  Service: Urology;  Laterality: N/A;  CYSTOSCOPY  Social History:  reports that he quit smoking about 19 years ago. His smoking use included Cigarettes. He has a 75 pack-year smoking history. He has never used smokeless tobacco. He reports that he drinks about 1.8 oz of alcohol per week. He reports that he does not use illicit drugs.  Allergies  Allergen Reactions  . Ace Inhibitors Cough  . Clarithromycin Other (See Comments)    Strange thoughts and fell with biaxin  . Codeine Nausea And  Vomiting  . Oxycodone-Acetaminophen Other (See Comments)    Felt closed in  . Viberzi [Eluxadoline] Other (See Comments)    Severe cramps and impaction    Family History:  Family History  Problem Relation Age of Onset  . Heart disease Mother   . Colon cancer Neg Hx      Prior to Admission medications   Medication Sig Start Date End Date Taking? Authorizing Provider  bisoprolol (ZEBETA) 5 MG tablet Take 0.5 tablets (2.5 mg total) by mouth daily. 05/12/15  Yes Jacolyn Reedy, MD  DULERA 100-5 MCG/ACT AERO Inhale 2 puffs into the lungs 2 (two) times daily. 11/26/14  Yes Tanda Rockers, MD  furosemide (LASIX) 40 MG tablet Take 40 mg by mouth daily.   Yes Historical Provider, MD  ipratropium-albuterol (DUONEB) 0.5-2.5 (3) MG/3ML SOLN Take 3 mLs by nebulization every 8 (eight) hours as needed (wheezing).   Yes Historical Provider, MD  nitroGLYCERIN (NITROSTAT) 0.4 MG SL tablet Place 0.4 mg under the tongue every 5 (five) minutes as needed for chest pain.   Yes Historical Provider, MD  polyethylene glycol (MIRALAX / GLYCOLAX) packet Take 17 g by mouth daily. 01/21/15  Yes Charlesetta Shanks, MD  tiotropium (SPIRIVA) 18 MCG inhalation capsule Place 1 capsule (18 mcg total) into inhaler and inhale daily. 03/04/15 03/03/16 Yes Tanda Rockers, MD  UNABLE TO FIND Med Name: Med Pass 120 cc by mouth twice daily for malnutrition   Yes Historical Provider, MD  warfarin (COUMADIN) 2 MG tablet Take 2 mg by mouth daily.   Yes Historical Provider, MD   Physical Exam: Filed Vitals:   05/29/15 1310 05/29/15 1432 05/29/15 1500  BP: 89/59 99/69 98/68   Pulse: 69 70 78  Temp: 98.2 F (36.8 C)    TempSrc: Oral    Resp: 16 16 24   SpO2: 98% 99% 100%    Physical Exam  Constitutional: Appears well-developed and well-nourished. No distress.  HENT: Normocephalic. No tonsillar erythema or exudates Eyes: Conjunctivae are normal. No scleral icterus.  Neck: Normal ROM. Neck supple. No JVD. No tracheal deviation. No  thyromegaly.  CVS: rate controlled, S1/S2 +, appreciated   Pulmonary: diminished breath sounds bilaterally, coarse, gurgling sounds bilaterally  Abdominal: Soft. BS +,  no distension, tenderness, rebound or guarding.  Musculoskeletal: Normal range of motion. No edema and no tenderness.  Lymphadenopathy: No lymphadenopathy noted, cervical, inguinal. Neuro: Alert. Normal reflexes, muscle tone coordination. No focal neurologic deficits. Skin: Skin is warm and dry. No rash noted.  No erythema. No pallor.  Psychiatric: Normal mood and affect. Behavior, judgment, thought content normal.   Labs on Admission:  Basic Metabolic Panel:  Recent Labs Lab 05/29/15 1326  NA 133*  K 3.6  CL 95*  CO2 28  GLUCOSE 168*  BUN 58*  CREATININE 2.19*  CALCIUM 8.3*   Liver Function Tests: No results for input(s): AST, ALT, ALKPHOS, BILITOT, PROT, ALBUMIN in the last 168 hours. No results for input(s): LIPASE, AMYLASE in the last 168 hours. No results for  input(s): AMMONIA in the last 168 hours. CBC:  Recent Labs Lab 05/29/15 1326  WBC 6.8  HGB 9.9*  HCT 30.3*  MCV 86.6  PLT 148*   Cardiac Enzymes: No results for input(s): CKTOTAL, CKMB, CKMBINDEX, TROPONINI in the last 168 hours. BNP: Invalid input(s): POCBNP CBG: No results for input(s): GLUCAP in the last 168 hours.  If 7PM-7AM, please contact night-coverage [email or web address] Password [facility] 05/29/2015, 4:57 PM

## 2015-05-29 NOTE — Progress Notes (Signed)
ANTICOAGULATION CONSULT NOTE - Initial Consult  Pharmacy Consult for Warfarin Indication: atrial fibrillation  Allergies  Allergen Reactions  . Ace Inhibitors Cough  . Clarithromycin Other (See Comments)    Strange thoughts and fell with biaxin  . Codeine Nausea And Vomiting  . Oxycodone-Acetaminophen Other (See Comments)    Felt closed in  . Viberzi [Eluxadoline] Other (See Comments)    Severe cramps and impaction   Patient Measurements:   Total body weight 55.2kg  Vital Signs: Temp: 98.2 F (36.8 C) (12/30 1310) Temp Source: Oral (12/30 1310) BP: 98/68 mmHg (12/30 1500) Pulse Rate: 78 (12/30 1500)  Labs:  Recent Labs  05/29/15 1326  HGB 9.9*  HCT 30.3*  PLT 148*  CREATININE 2.19*   Estimated Creatinine Clearance: 20 mL/min (by C-G formula based on Cr of 2.19).  Medical History: Complex medical history: including A Flutter, CAD/CABG, PVD, Aortic valve disorder, HF, CKD Medications:  Scheduled:  . ceFEPime (MAXIPIME) IV  1 g Intravenous Q24H  . fluconazole (DIFLUCAN) IV  100 mg Intravenous Q24H  . [START ON 05/30/2015] fluconazole (DIFLUCAN) IV  50 mg Intravenous Q24H  . [START ON 05/30/2015] vancomycin  500 mg Intravenous Q24H  . [DISCONTINUED] vancomycin  1,000 mg Intravenous Once   Anti-infectives    Start     Dose/Rate Route Frequency Ordered Stop   05/30/15 1800  fluconazole (DIFLUCAN) IVPB 50 mg     50 mg 25 mL/hr over 60 Minutes Intravenous Every 24 hours 05/29/15 1654 06/03/15 1759   05/30/15 1700  vancomycin (VANCOCIN) 500 mg in sodium chloride 0.9 % 100 mL IVPB     500 mg 100 mL/hr over 60 Minutes Intravenous Every 24 hours 05/29/15 1655 06/06/15 1659   05/29/15 2200  ceFEPIme (MAXIPIME) 1 g in dextrose 5 % 50 mL IVPB     1 g 100 mL/hr over 30 Minutes Intravenous Every 24 hours 05/29/15 1618 06/06/15 2159   05/29/15 1800  fluconazole (DIFLUCAN) IVPB 100 mg  Status:  Discontinued     100 mg 50 mL/hr over 60 Minutes Intravenous Every 24 hours  05/29/15 1618 05/29/15 1653   05/29/15 1800  fluconazole (DIFLUCAN) IVPB 100 mg     100 mg 50 mL/hr over 60 Minutes Intravenous Every 24 hours 05/29/15 1653 05/30/15 1759   05/29/15 1700  ceFEPIme (MAXIPIME) 1 g in dextrose 5 % 50 mL IVPB  Status:  Discontinued     1 g 100 mL/hr over 30 Minutes Intravenous Every 24 hours 05/29/15 1610 05/29/15 1618   05/29/15 1600  vancomycin (VANCOCIN) IVPB 1000 mg/200 mL premix  Status:  Discontinued     1,000 mg 200 mL/hr over 60 Minutes Intravenous  Once 05/29/15 1536 05/29/15 1705   05/29/15 1545  piperacillin-tazobactam (ZOSYN) IVPB 3.375 g  Status:  Discontinued     3.375 g 100 mL/hr over 30 Minutes Intravenous  Once 05/29/15 1534 05/29/15 1635     Assessment: 51 yoM SNF resident, to ED for hypotension, was given fluid bolus at facility. CXray on admit: new R base infiltrate, effusion. Admit for Vancomycin, Cefepime. On Warfarin 2mg  daily for AFib (last dose 12/29) until next INR check, last INR low at 1.5 (per RN at facility); usual Warfarin dose 0.5mg  daily per facility.   DDI: Fluconazole added, can increase PT/INR  Baseline INR today 1.48  Goal of Therapy:  INR 2-3 Monitor platelets by anticoagulation protocol: Yes   Plan:   Warfarin 4mg  today  Daily PT/INR  Note Fluconazole can increase INR,  5 day course beginning 12/30  Minda Ditto PharmD Pager [phone] 05/29/2015, 6:29 PM

## 2015-05-29 NOTE — ED Notes (Signed)
Per EMS from [facility]. Was called out for hypotension. From 0800 until noon today was given 1000 cc of oral fluids. This happened once yesterday, was given IV fluids at facility yesterday.

## 2015-05-29 NOTE — Progress Notes (Signed)
Patient noted to have been admitted and discharged from the hospital from 12/05 to 12/13.   EDCM spoke to patient at bedside.  Patient reports he lives at home with his two sons.  Patient reports he has a walker and an elevated toilet seat at home and a scooter.  Patient reports he does not wear oxygen at home.  Per chart review, patient is from [facility].  Patient was seen by Dr. Bubba Camp at [facility].  Patient being admitted for altered mental status and pneumonia.  No further EDCM needs at this time.

## 2015-05-29 NOTE — ED Provider Notes (Signed)
CSN: [id number]     Arrival date & time 05/29/15  1259 History   First MD Initiated Contact with Patient 05/29/15 1334     Chief Complaint  Patient presents with  . Hypotension     The history is provided by the patient and the nursing home.   patient presents with some generalized weakness. Has had occasional cough. Sent from nursing home for hypotension. Given IV fluids and extraoral fluids at nursing home. Sent in for further evaluation. Laboratory done yesterday reportedly been unable to results. Patient states he's felt a little weak but otherwise feels good. No nausea vomiting. No diarrhea. No dysuria. Recent CHF admission. The patient's son states that the patient's blood pressure has been running in the 90s recently.  Past Medical History  Diagnosis Date  . Gout     "only once in my lifetime" (12/18/2012)  . Depression   . PVD (peripheral vascular disease) ([place])     s/p B CEA  . COPD (chronic obstructive pulmonary disease) ([place])      PFT 6.19.07: FEV1 465  ratio 60 with 25% response to B2.  > PFTs 8.29.07: FEV1 61%  ratio 46% no better after B2.  > add on advair 12.20.11-improved 1.31.12  . AAA (abdominal aortic aneurysm) ([place])   . Melanosis coli   . Internal hemorrhoids   . Carotid artery occlusion   . Aortic valve disorder   . Chronic combined systolic and diastolic heart failure ([place])   . CAD (coronary artery disease), native coronary artery     CABG w LIMA to LAD, SVG to dx, OM, RCA 1989 Dr. Arlyce Dice for 3VD PTCA of OM, 1999 and 2000 Cath showed occlusion of left main and RCA with stenosis in OM Redo redo CABG w SVG to OM, SVG to RCA8/10/00 Dr. Cyndia Bent   . Hyperlipidemia   . BPH (benign prostatic hypertrophy)   . Hypertensive heart disease     Change toprol to bisoprolol 10 mg daily on trial basis  05/21/2012  - notified by [facility] non adherent 07/17/2012    . LBBB (left bundle branch block)   . Second degree heart block 12/18/2012    s/p MDT Adapta L pacemaker 12-20-2012 by Dr  Caryl Comes  . Shingles     mild  . Chronic kidney disease stage III (GFR 30-59 ml/min)     saw dr Justin Mend oct 2015 and released by dr webb  . Congenital absence of kidney     "noted during AAA repair; never knew it before" (12/18/2012)  . Myocardial infarction ([place]) 1979    times 2  . Presence of permanent cardiac pacemaker   . Cancer of bladder wall ([place]) 06/16/2014   Past Surgical History  Procedure Laterality Date  . Cholecystectomy  1998  . Hemiarthroplasty shoulder fracture Right ~ 2008     x 2 - [place]  . Carotid endarterectomy Bilateral 1990's  . Abdominal aortic aneurysm repair  1998  . Carotid-subclavian bypass graft Left 1990's  . Anal fissure repair      12/18/2012 "I don't remember this"  . Hemorroidectomy      12/18/2012 "I don't remember this"  . Carpal tunnel release Right 1980's    "Dr. Marrian Salvage" (12/18/2012)  . Cardiac catheterization      "2 or 3" (12/18/2012)  . Coronary angioplasty with stent placement      "I've got 1 or 2" (12/18/2012)  . Hip fracture surgery  1944    "fell out of a tree; had  it operated on 3 times" (12/18/2012)  . Refractive surgery Bilateral 1990's?  . Pacemaker insertion  12-20-2012    dual chamber Medtronic Adapta L pacemaker implanted by Dr Caryl Comes  . Cardioversion N/A 12/30/2013    Procedure: CARDIOVERSION;  Surgeon: Jacolyn Reedy, MD;  Location: [place];  Service: Cardiovascular;  Laterality: N/A;  . Permanent pacemaker insertion N/A 12/20/2012    Procedure: PERMANENT PACEMAKER INSERTION;  Surgeon: Deboraha Sprang, MD;  Location: [facility] CATH LAB;  Service: Cardiovascular;  Laterality: N/A;  . Coronary artery bypass graft  1989, 2000     "CABG X ?3; CABG X 5 w//Bartle" (12/18/2012)  . Transurethral resection of bladder tumor N/A 06/16/2014    Procedure: TRANSURETHRAL RESECTION OF BLADDER TUMOR (TURBT) ;  Surgeon: Jorja Loa, MD;  Location: [facility];  Service: Urology;  Laterality: N/A;  . Cardioversion N/A 09/26/2014    Procedure:  CARDIOVERSION;  Surgeon: Jacolyn Reedy, MD;  Location: [place];  Service: Cardiovascular;  Laterality: N/A;  . Eye surgery      bil catarcts with lens implants on right eye  . Transurethral resection of bladder tumor N/A 04/02/2015    Procedure: TRANSURETHRAL RESECTION OF BLADDER TUMOR (TURBT);  Surgeon: Franchot Gallo, MD;  Location: [facility];  Service: Urology;  Laterality: N/A;  CYSTOSCOPY      Family History  Problem Relation Age of Onset  . Heart disease Mother   . Colon cancer Neg Hx    Social History  Substance Use Topics  . Smoking status: Former Smoker -- 1.50 packs/day for 50 years    Types: Cigarettes    Quit date: 05/30/1996  . Smokeless tobacco: Never Used  . Alcohol Use: 1.8 oz/week    3 Cans of beer per week     Comment: beer few times per week    Review of Systems  Constitutional: Positive for appetite change and fatigue. Negative for activity change.  Eyes: Negative for pain.  Respiratory: Negative for chest tightness and shortness of breath.   Cardiovascular: Negative for chest pain and leg swelling.  Gastrointestinal: Negative for nausea, vomiting, abdominal pain and diarrhea.  Genitourinary: Negative for flank pain.  Musculoskeletal: Negative for back pain and neck stiffness.  Skin: Negative for rash.  Neurological: Negative for weakness, numbness and headaches.  Psychiatric/Behavioral: Negative for behavioral problems.      Allergies  Ace inhibitors; Clarithromycin; Codeine; Oxycodone-acetaminophen; and Viberzi  Home Medications   Prior to Admission medications   Medication Sig Start Date End Date Taking? Authorizing Provider  bisoprolol (ZEBETA) 5 MG tablet Take 0.5 tablets (2.5 mg total) by mouth daily. 05/12/15  Yes Jacolyn Reedy, MD  DULERA 100-5 MCG/ACT AERO Inhale 2 puffs into the lungs 2 (two) times daily. 11/26/14  Yes Tanda Rockers, MD  furosemide (LASIX) 40 MG tablet Take 40 mg by mouth daily.   Yes Historical Provider, MD   ipratropium-albuterol (DUONEB) 0.5-2.5 (3) MG/3ML SOLN Take 3 mLs by nebulization every 8 (eight) hours as needed (wheezing).   Yes Historical Provider, MD  nitroGLYCERIN (NITROSTAT) 0.4 MG SL tablet Place 0.4 mg under the tongue every 5 (five) minutes as needed for chest pain.   Yes Historical Provider, MD  polyethylene glycol (MIRALAX / GLYCOLAX) packet Take 17 g by mouth daily. 01/21/15  Yes Charlesetta Shanks, MD  tiotropium (SPIRIVA) 18 MCG inhalation capsule Place 1 capsule (18 mcg total) into inhaler and inhale daily. 03/04/15 03/03/16 Yes Tanda Rockers, MD  UNABLE TO FIND Med Name:  Med Pass 120 cc by mouth twice daily for malnutrition   Yes Historical Provider, MD  warfarin (COUMADIN) 2 MG tablet Take 2 mg by mouth daily.   Yes Historical Provider, MD   BP 98/68 mmHg  Pulse 78  Temp(Src) 98.2 F (36.8 C) (Oral)  Resp 24  SpO2 100% Physical Exam  Constitutional: He appears well-developed.  Mucous membranes are dry  Cardiovascular: Normal rate.   Pulmonary/Chest:  Occasional rales  Abdominal: Soft.  Musculoskeletal: He exhibits edema.  Neurological: He is alert.  Skin: Skin is warm.    ED Course  Procedures (including critical care time) Labs Review Labs Reviewed  BASIC METABOLIC PANEL - Abnormal; Notable for the following:    Sodium 133 (*)    Chloride 95 (*)    Glucose, Bld 168 (*)    BUN 58 (*)    Creatinine, Ser 2.19 (*)    Calcium 8.3 (*)    GFR calc non Af Amer 26 (*)    GFR calc Af Amer 30 (*)    All other components within normal limits  CBC - Abnormal; Notable for the following:    RBC 3.50 (*)    Hemoglobin 9.9 (*)    HCT 30.3 (*)    RDW 18.6 (*)    Platelets 148 (*)    All other components within normal limits  URINALYSIS, ROUTINE W REFLEX MICROSCOPIC (NOT AT [facility])  I-STAT CG4 LACTIC ACID, ED    Imaging Review Dg Chest 2 View  05/29/2015  CLINICAL DATA:  Productive cough EXAM: CHEST - 2 VIEW COMPARISON:  05/04/2015 FINDINGS: Cardiac shadow is again  enlarged. A pacing device is again seen and stable. Thickening of the minor fissure is again noted on the right and stable. New right basilar infiltrate and small effusion is seen. The left lung is clear. Mild interstitial changes are again identified similar to that seen on the prior exam. IMPRESSION: New right basilar infiltrate and associated effusion. Superimposed mild interstitial changes stable from the prior exam. Electronically Signed   By: Inez Catalina M.D.   On: 05/29/2015 13:57   I have personally reviewed and evaluated these images and lab results as part of my medical decision-making.   EKG Interpretation   Date/Time:  Friday May 29 2015 13:26:58 EST Ventricular Rate:  70 PR Interval:    QRS Duration: 187 QT Interval:  476 QTC Calculation: 514 R Axis:   142 Text Interpretation:  Atrial fibrillation Ventricular premature complex  Consider left ventricular hypertrophy Prolonged QT interval Baseline  wander in lead(s) V4 V5 Confirmed by Alvino Chapel  MD, Ovid Curd [phone]) on  05/29/2015 3:30:53 PM      MDM   Final diagnoses:  HCAP (healthcare-associated pneumonia)    Patient with generalized weakness and hypotension. Found to have pneumonia on x-ray. Has had a cough. Normal lactic acid. Blood pressure somewhat improved. From nursing home and will admit to internal medicine.    Davonna Belling, MD 05/29/15 [phone]

## 2015-05-30 DIAGNOSIS — J9601 Acute respiratory failure with hypoxia: Secondary | ICD-10-CM

## 2015-05-30 DIAGNOSIS — Z515 Encounter for palliative care: Secondary | ICD-10-CM | POA: Insufficient documentation

## 2015-05-30 LAB — COMPREHENSIVE METABOLIC PANEL
ALT: 17 U/L (ref 17–63)
AST: 25 U/L (ref 15–41)
Albumin: 2.3 g/dL — ABNORMAL LOW (ref 3.5–5.0)
Alkaline Phosphatase: 121 U/L (ref 38–126)
Anion gap: 10 (ref 5–15)
BUN: 55 mg/dL — AB (ref 6–20)
CHLORIDE: 95 mmol/L — AB (ref 101–111)
CO2: 30 mmol/L (ref 22–32)
Calcium: 8.3 mg/dL — ABNORMAL LOW (ref 8.9–10.3)
Creatinine, Ser: 2.12 mg/dL — ABNORMAL HIGH (ref 0.61–1.24)
GFR, EST AFRICAN AMERICAN: 31 mL/min — AB (ref >60)
GFR, EST NON AFRICAN AMERICAN: 27 mL/min — AB (ref >60)
Glucose, Bld: 142 mg/dL — ABNORMAL HIGH (ref 65–99)
POTASSIUM: 4.1 mmol/L (ref 3.5–5.1)
Sodium: 135 mmol/L (ref 135–145)
Total Bilirubin: 1.6 mg/dL — ABNORMAL HIGH (ref 0.3–1.2)
Total Protein: 6.1 g/dL — ABNORMAL LOW (ref 6.5–8.1)

## 2015-05-30 LAB — STREP PNEUMONIAE URINARY ANTIGEN: STREP PNEUMO URINARY ANTIGEN: NEGATIVE

## 2015-05-30 LAB — HIV ANTIBODY (ROUTINE TESTING W REFLEX): HIV Screen 4th Generation wRfx: NONREACTIVE

## 2015-05-30 LAB — CBC
HEMATOCRIT: 26.5 % — AB (ref 39.0–52.0)
HEMOGLOBIN: 8.6 g/dL — AB (ref 13.0–17.0)
MCH: 27.7 pg (ref 26.0–34.0)
MCHC: 32.5 g/dL (ref 30.0–36.0)
MCV: 85.2 fL (ref 78.0–100.0)
Platelets: 145 10*3/uL — ABNORMAL LOW (ref 150–400)
RBC: 3.11 MIL/uL — AB (ref 4.22–5.81)
RDW: 18.5 % — ABNORMAL HIGH (ref 11.5–15.5)
WBC: 6.4 10*3/uL (ref 4.0–10.5)

## 2015-05-30 LAB — PROTIME-INR
INR: 1.59 — ABNORMAL HIGH (ref 0.00–1.49)
Prothrombin Time: 19 seconds — ABNORMAL HIGH (ref 11.6–15.2)

## 2015-05-30 MED ORDER — WARFARIN SODIUM 1 MG PO TABS
1.0000 mg | ORAL_TABLET | Freq: Once | ORAL | Status: AC
  Administered 2015-05-30: 1 mg via ORAL
  Filled 2015-05-30: qty 1

## 2015-05-30 MED ORDER — FLUCONAZOLE 50 MG PO TABS
50.0000 mg | ORAL_TABLET | Freq: Every day | ORAL | Status: DC
  Administered 2015-05-30 – 2015-05-31 (×2): 50 mg via ORAL
  Filled 2015-05-30 (×3): qty 1

## 2015-05-30 MED ORDER — BOOST PLUS PO LIQD
237.0000 mL | Freq: Three times a day (TID) | ORAL | Status: DC
  Administered 2015-05-30 – 2015-06-01 (×4): 237 mL via ORAL
  Filled 2015-05-30 (×8): qty 237

## 2015-05-30 NOTE — Progress Notes (Signed)
ANTICOAGULATION CONSULT NOTE - follow-up Consult  Pharmacy Consult for Warfarin Indication: atrial fibrillation  Allergies  Allergen Reactions  . Ace Inhibitors Cough  . Clarithromycin Other (See Comments)    Strange thoughts and fell with biaxin  . Codeine Nausea And Vomiting  . Oxycodone-Acetaminophen Other (See Comments)    Felt closed in  . Viberzi [Eluxadoline] Other (See Comments)    Severe cramps and impaction   Patient Measurements: Height: 5\' 10"  (177.8 cm) Weight: 131 lb 9.8 oz (59.7 kg) IBW/kg (Calculated) : 73 Total body weight 55.2kg  Vital Signs: Temp: 97.9 F (36.6 C) (12/31 0555) Temp Source: Oral (12/31 0555) BP: 100/58 mmHg (12/31 0555) Pulse Rate: 69 (12/31 0555)  Labs:  Recent Labs  05/29/15 1326 05/29/15 1642 05/30/15 0605  HGB 9.9*  --  8.6*  HCT 30.3*  --  26.5*  PLT 148*  --  145*  LABPROT  --  18.0* 19.0*  INR  --  1.48 1.59*  CREATININE 2.19*  --  2.12*   Estimated Creatinine Clearance: 22.3 mL/min (by C-G formula based on Cr of 2.12).  Medical History: Complex medical history: including A Flutter, CAD/CABG, PVD, Aortic valve disorder, HF, CKD Medications:  Scheduled:  . ceFEPime (MAXIPIME) IV  1 g Intravenous Q24H  . fluconazole (DIFLUCAN) IV  100 mg Intravenous Q24H  . vancomycin  500 mg Intravenous Q24H  . Warfarin - Pharmacist Dosing Inpatient   Does not apply q1800   Anti-infectives    Start     Dose/Rate Route Frequency Ordered Stop   05/30/15 1800  fluconazole (DIFLUCAN) IVPB 50 mg  Status:  Discontinued     50 mg 25 mL/hr over 60 Minutes Intravenous Every 24 hours 05/29/15 1654 05/29/15 1824   05/30/15 1800  fluconazole (DIFLUCAN) IVPB 100 mg     100 mg 50 mL/hr over 60 Minutes Intravenous Every 24 hours 05/29/15 1824 06/03/15 1759   05/30/15 1700  vancomycin (VANCOCIN) 500 mg in sodium chloride 0.9 % 100 mL IVPB     500 mg 100 mL/hr over 60 Minutes Intravenous Every 24 hours 05/29/15 1655 06/06/15 1659   05/29/15 2200   ceFEPIme (MAXIPIME) 1 g in dextrose 5 % 50 mL IVPB     1 g 100 mL/hr over 30 Minutes Intravenous Every 24 hours 05/29/15 1618 06/06/15 2159   05/29/15 1800  fluconazole (DIFLUCAN) IVPB 100 mg  Status:  Discontinued     100 mg 50 mL/hr over 60 Minutes Intravenous Every 24 hours 05/29/15 1618 05/29/15 1653   05/29/15 1800  fluconazole (DIFLUCAN) IVPB 100 mg     100 mg 50 mL/hr over 60 Minutes Intravenous Every 24 hours 05/29/15 1653 05/29/15 1950   05/29/15 1700  ceFEPIme (MAXIPIME) 1 g in dextrose 5 % 50 mL IVPB  Status:  Discontinued     1 g 100 mL/hr over 30 Minutes Intravenous Every 24 hours 05/29/15 1610 05/29/15 1618   05/29/15 1600  vancomycin (VANCOCIN) IVPB 1000 mg/200 mL premix  Status:  Discontinued     1,000 mg 200 mL/hr over 60 Minutes Intravenous  Once 05/29/15 1536 05/29/15 1705   05/29/15 1545  piperacillin-tazobactam (ZOSYN) IVPB 3.375 g  Status:  Discontinued     3.375 g 100 mL/hr over 30 Minutes Intravenous  Once 05/29/15 1534 05/29/15 1635     Assessment: 23 yoM SNF resident, to ED for hypotension, was given fluid bolus at facility. CXray on admit: new R base infiltrate, effusion. Admit for Vancomycin, Cefepime. On Warfarin 2mg   daily for AFib (last dose 12/29) until next INR check, last INR low at 1.5 (per RN at facility); usual Warfarin dose 0.5mg  daily per facility.    Today's labs, 05/30/2015:  INR subtherapeutic, up from baseline  CBC: Hgb down from baseline, pltc mildly decreased/stable from baseline  DDI: Fluconazole added, can increase PT/INR  Goal of Therapy:  INR 2-3 Monitor platelets by anticoagulation protocol: Yes   Plan:   Warfarin 1mg  today - reduce for comitant fluconazole  Daily PT/INR -watch trend closely as was recently on 0.5mg  daily with therapeutic INR  Note Fluconazole can increase INR, 5 day course beginning 12/30, typically do not treat yeast in urine unless neutropenic or undergoing urologic procedure.  If has bladder catheter,  remove if possible or exchange if needed.    Minda Ditto PharmD Pager [phone] 05/30/2015, 11:09 AM

## 2015-05-30 NOTE — Progress Notes (Signed)
Edward [name] [id number] DOB: [birth year] DOA: 05/29/2015 PCP: Gwendolyn Grant, MD  Brief narrative:  81 y/o ? recent d/c from Hospital adm 12/5-12/13 c Acute /Chr Systolic HF Combined sys/diastolic HF, Most recent ECHO EF 15% 05/30/15, PA Peak 54 mm hg 2nd degree HB  c PPM placement 12/20/12 Previous Bilat Carotid disease Mild AoS CABG with previous re-do bypass grafting Recent TURBT for bladder CA  ED work up BP 88/44, Tmax 100.5 HB 8.6, WBC 6.4, PLT 145 CXR New right basilar infiltrate and associated effusion. Yeast in Urine   Past medical history-As per Problem list Chart reviewed as below-   Consultants:  palliative  Procedures:  none  Antibiotics:  none   Subjective   Alert but confused Not really answering  appropriately-although intermittently lucid. Note family and him hd long discussions with PMT this am   Objective    Interim History:   Telemetry:    Objective: Filed Vitals:   05/29/15 1707 05/29/15 1808 05/29/15 2229 05/30/15 0555  BP: 84/57 82/51 93/55  100/58  Pulse: 70  68 69  Temp: 98.3 F (36.8 C)  98 F (36.7 C) 97.9 F (36.6 C)  TempSrc: Oral  Oral Oral  Resp: 24  24 24   Height: 5\' 10"  (1.778 m)     Weight: 59.7 kg (131 lb 9.8 oz)     SpO2: 99%  99% 100%    Intake/Output Summary (Last 24 hours) at 05/30/15 0741 Last data filed at 05/30/15 0656  Gross per 24 hour  Intake    122 ml  Output    625 ml  Net   -503 ml    Exam:  General: frail confused Cardiovascular: s1 s2 no m/r/g Respiratory: some crackles and rales.  No n/v Abdomen: soft nt nd no rebound Skin no le edema  Neuro intact but confused  Data Reviewed: Basic Metabolic Panel:  Recent Labs Lab 05/29/15 1326 05/30/15 0605  NA 133* 135  K 3.6 4.1  CL 95* 95*  CO2 28 30  GLUCOSE 168* 142*  BUN 58* 55*  CREATININE 2.19* 2.12*  CALCIUM 8.3* 8.3*   Liver Function Tests:  Recent Labs Lab 05/30/15 0605  AST 25  ALT 17  ALKPHOS 121    BILITOT 1.6*  PROT 6.1*  ALBUMIN 2.3*   No results for input(s): LIPASE, AMYLASE in the last 168 hours. No results for input(s): AMMONIA in the last 168 hours. CBC:  Recent Labs Lab 05/29/15 1326 05/30/15 0605  WBC 6.8 6.4  HGB 9.9* 8.6*  HCT 30.3* 26.5*  MCV 86.6 85.2  PLT 148* 145*   Cardiac Enzymes: No results for input(s): CKTOTAL, CKMB, CKMBINDEX, TROPONINI in the last 168 hours. BNP: Invalid input(s): POCBNP CBG: No results for input(s): GLUCAP in the last 168 hours.  No results found for this or any previous visit (from the past 240 hour(s)).   Studies:              All Imaging reviewed and is as per above notation   Scheduled Meds: . ceFEPime (MAXIPIME) IV  1 g Intravenous Q24H  . fluconazole (DIFLUCAN) IV  100 mg Intravenous Q24H  . vancomycin  500 mg Intravenous Q24H  . Warfarin - Pharmacist Dosing Inpatient   Does not apply q1800   Continuous Infusions: . sodium chloride 40 mL/hr at 05/30/15 [id number]     Assessment/Plan:  Acute respiratory failure with hypoxemia (HCC) / HCAP (healthcare-associated pneumonia) - Oxygen saturation 93% with [place] oxygen support - Hypoxia 2/2  To Pneumonia as cxr on admission showed right basilar infiltrate - Pneumonia order set placed-antibiotic delineation as per palliative care--would switch to oral Levaquin in a.m. -  blood cultures + resp cultures pending , legionella pending  and strep pneumoniais negative  results - Continue oxygen support via [place] to keep O2 saturation above 90%  Active Problems:  Yeast UTI - Yeast seen on UTI - change IV empiric fluconazole to PO until urine culture results are back   CAD (coronary artery disease), native coronary artery without angina pectoris  - Stable    Atrial fibrillation (HCC) / Long-term (current) use of anticoagulants - CHADS vasc score 3 (age, hypertension, chf) - On anticoagulation with coumadin, per pharmacy dosing - bisoprolol  Was held on admit given  hypotension   Cancer of bladder wall ([place]) - Has had repeat TURBT in 03/2015 by Dr. Diona Fanti    Chronic combined systolic and diastolic heart failure ([place]) - Last 2 DE CHO 05/04/2015 with EF 15% - Lasix on hold due to hypotension    Protein-calorie malnutrition, severe - In the context of chronic illness -pallaitve care needed   Palliative care encounter - Appreciate palliative care consult for goals of care   CKD (chronic kidney disease) stage 4, GFR 15-29 ml/min (HCC) - Baseline Cr 2.43 about 2 years ago - Cr on this admission 2.19, at baseline values    Thrombocytopenia (HCC) - Monitor while pt on coumadin - No reports of bleeding    Anemia of chronic renal failure, stage 4 (severe) (HCC) - Hemoglobin stable at 9.9    Appt with PCP: none Code Status:  DNR-palliative confirming GOC and delineating Family Communication: discussed with son at bedside Disposition Plan: home DVT prophylaxis: SCD Consultants: pallaitive  Verneita Griffes, MD  [facility] Pager [phone] 05/30/2015, 7:41 AM    LOS: 1 day     social

## 2015-05-30 NOTE — Progress Notes (Signed)
Patient desated to 79 while repositioning in bed on room air. Oxygen returned to 100% when 2L placed. [name]. Brigitte Pulse, RN

## 2015-05-30 NOTE — Progress Notes (Signed)
Initial Nutrition Assessment  DOCUMENTATION CODES:   Severe malnutrition in context of chronic illness  INTERVENTION:   -Provide Boost Plus TID, each provides 360 kcal and 14g of protein -Provide daily snacks -RD to continue to monitor  NUTRITION DIAGNOSIS:   Malnutrition related to chronic illness as evidenced by percent weight loss, severe depletion of body fat, severe depletion of muscle mass.  GOAL:   Patient will meet greater than or equal to 90% of their needs  MONITOR:   PO intake, Supplement acceptance, Labs, Weight trends, Skin, I & O's  REASON FOR ASSESSMENT:   Consult Assessment of nutrition requirement/status  ASSESSMENT:   81 year old male with past medical history including but not limited to coronary artery disease status post bypass grafting and permanent pacemaker, atrial fibrillation on anticoagulation with Coumadin, chronic combined systolic and diastolic CHF, history of bladder tumor status post TURBT in 03/2015, chronic kidney disease stage IV who presented to [facility] hospital from nursing home because of hypotension.  Pt in room with no family present. Pt unable to answer most questions. Pt with empty bowl of oatmeal and he has been sipping on milk.  Pt did agree to snacks and states he drinks Boost occasionally at home. RD to order. Pt has lost 18 lb since 5/17 (12% weight loss x 7 months, significant for time frame). Weight loss may be masked by fluid (hx of CHF).  Nutrition-Focused physical exam completed. Findings are severe fat depletion, severe muscle depletion, and moderate edema.    Labs reviewed: Elevated BUN & Creatinine  Diet Order:  Diet regular Room service appropriate?: Yes; Fluid consistency:: Thin  Skin:  Wound (see comment) (Stg I heel ulcer)  Last BM:  PTA  Height:   Ht Readings from Last 1 Encounters:  05/29/15 5\' 10"  (1.778 m)    Weight:   Wt Readings from Last 1 Encounters:  05/29/15 131 lb 9.8 oz (59.7 kg)     Ideal Body Weight:  75.5 kg  BMI:  Body mass index is 18.88 kg/(m^2).  Estimated Nutritional Needs:   Kcal:  1700-1900  Protein:  80-90g  Fluid:  1.8L/day  EDUCATION NEEDS:   No education needs identified at this time  Clayton Bibles, MS, RD, LDN Pager: [phone] After Hours Pager: [phone]

## 2015-05-30 NOTE — Consult Note (Addendum)
Consultation Note Date: 05/30/2015   Patient Name: Edward [name]  DOB: 03-01-1930  MRN: [id number]  Age / Sex: 81 y.o., male  PCP: Rowe Clack, MD Referring Physician: Nita Sells, MD  Reason for Consultation: Establishing goals of care    Clinical Assessment/Narrative: Patient is a 81 year old gentleman with a past medical history significant for coronary artery disease status post bypass grafting, permanent pacemaker placement, atrial fibrillation patient maintained on oral anticoagulation with Coumadin, declining combined systolic and diastolic congestive heart failure, stage IV chronic kidney disease. Earlier this year, the patient has been diagnosed with bladder tumor and is status post transurethral resection of bladder tumor. Patient was hospitalized in early part of December 2016. This was in the context of volume overload-CHF exacerbation. The patient was living at home with his sons at that time. Subsequently, he was transitioned to skilled nursing facility for rehabilitation. At that time, palliative consult was undertaken. Goals of care discussions were done. DO NOT RESUSCITATE/DO NOT INTUBATE was recommended, addition of hospice as an extra layer of support was recommended.  Patient has arrived from his nursing facility and has been admitted to the hospitalist service since 05-29-15 for healthcare associated pneumonia, volume overload. Palliative has been reconsult to address goals of care.  Patient is awake alert resting in bed. He states he would like to leave the hospital. Extensive goals of care discussions undertaken with the patient. He states that he is not able to drive anymore. He states that he missed church pretty much most of this year. He states that he knows the pneumonia is not can get better. He recalls conversations with his cardiologist Dr. Wynonia Lawman. He states that he is aware he  has a weak heart that is only getting worse. Patient states that he would prefer not to be discharged back to the nursing home. He would like to go home.  CODE STATUS discussions undertaken in great detail. It is reported that in a previous conversation when extensive discussions were held, the patient had stated "given a shot" referencing to full scope of resuscitation attempt such a CPR, shocks if indicated, intubation and mechanical ventilation. In conversation today, the patient states "don't do anything". Extensively explained DO NOT RESUSCITATE/DO NOT INTUBATE. Patient states that he would prefer a peaceful passing and allow natural death. Patient however asked that I gave one of his children a call and discuss.  Call placed to son Aaron Edelman at [phone]. The patient's son Aaron Edelman arrived at [facility] hospital shortly thereafter. Extensive conversations with son Aaron Edelman outside the patient's room. Brand states that the patient's wife passed away 10-Sep-2008 at [facility] place. Aaron Edelman states that he would like to take the patient home and is accepting of hospice support towards the end of this hospitalization. Initial information about hospice services given.  Contacts/Participants in Discussion: Primary Decision Maker:   Son Aaron Edelman Relationship to Patient son HCPOA: yes    SUMMARY OF RECOMMENDATIONS: Patient's son Aaron Edelman states that he will have further discussions with the patient today about DO NOT RESUSCITATE, DO NOT INTUBATE, addition of hospice as an extra layer of support, and discharge home with hospice. Aaron Edelman has been provided with direct contact information for the undersigned. Discussed with Dr. Verlon Au.  Await final decision making on behalf of the patient and family.  Palliative will follow-up in a.m.  Code Status/Advance Care Planning: Full code    Code Status Orders        Start     Ordered   05/29/15 1611  Full code   Continuous     05/29/15 1610    Advance Directive Documentation          Most Recent Value   Type of Advance Directive  Living will   Pre-existing out of facility DNR order (yellow form or pink MOST form)     "MOST" Form in Place?        Other Directives:Other  Symptom Management:    As above continue current scope of treatment for now  Palliative Prophylaxis:   Delirium Protocol  Additional Recommendations (Limitations, Scope, Preferences): Continue current scope of treatment.  Psycho-social/Spiritual:  Support System: Strong Desire for further Chaplaincy support:no Additional Recommendations: Education on Hospice  Prognosis: < 6 months  Discharge Planning: Home with Hospice  Likely. Awaiting son and patient final decision   Chief Complaint/ Primary Diagnoses: Present on Admission:  . Atrial fibrillation ([place]) . 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 CAD (coronary artery disease), native coronary artery . Chronic combined systolic and diastolic heart failure ([place]) . CKD (chronic kidney disease) stage 4, GFR 15-29 ml/min ([facility]) . Protein-calorie malnutrition, severe . HCAP (healthcare-associated pneumonia) . Thrombocytopenia ([place]) . Anemia of chronic renal failure, stage 4 (severe) ([place]) . Yeast UTI  I have reviewed the medical record, interviewed the patient and family, and examined the patient. The following aspects are pertinent.  History reviewed. No pertinent past medical history. Social History   Social History  . Marital Status: Widowed    Spouse Name: N/A  . Number of Children: N/A  . Years of Education: N/A   Occupational History  . retired from [employer]  . Smoking status: Former Smoker -- 1.50 packs/day for 50 years    Types: Cigarettes    Quit date: 05/30/1996  . Smokeless tobacco: Never Used  . Alcohol Use: 1.8 oz/week    3 Cans of beer per week     Comment: beer few times per week  . Drug Use: No  . Sexual Activity: No   Other Topics Concern  . None   Social History Narrative    Lives alone. Widower.   Family History  Problem Relation Age of Onset  . Heart disease Mother   . Colon cancer Neg Hx    Scheduled Meds: . ceFEPime (MAXIPIME) IV  1 g Intravenous Q24H  . fluconazole (DIFLUCAN) IV  100 mg Intravenous Q24H  . lactose free nutrition  237 mL Oral TID WC  . vancomycin  500 mg Intravenous Q24H  . warfarin  1 mg Oral ONCE-1800  . Warfarin - Pharmacist Dosing Inpatient   Does not apply q1800   Continuous Infusions: . sodium chloride 40 mL/hr at 05/30/15 0653   PRN Meds:. Medications Prior to Admission:  Prior to Admission medications   Medication Sig Start Date End Date Taking? Authorizing Provider  bisoprolol (ZEBETA) 5 MG tablet Take 0.5 tablets (2.5 mg total) by mouth daily. 05/12/15  Yes Jacolyn Reedy, MD  DULERA 100-5 MCG/ACT AERO Inhale 2 puffs into the lungs 2 (two) times daily. 11/26/14  Yes Tanda Rockers, MD  furosemide (LASIX) 40 MG tablet Take 40 mg by mouth daily.   Yes Historical Provider, MD  ipratropium-albuterol (DUONEB) 0.5-2.5 (3) MG/3ML SOLN Take 3 mLs by nebulization every 8 (eight) hours as needed (wheezing).   Yes Historical Provider, MD  nitroGLYCERIN (NITROSTAT) 0.4 MG SL tablet Place 0.4 mg under the tongue every 5 (five) minutes as needed for chest pain.   Yes  Historical Provider, MD  polyethylene glycol (MIRALAX / GLYCOLAX) packet Take 17 g by mouth daily. 01/21/15  Yes Charlesetta Shanks, MD  tiotropium (SPIRIVA) 18 MCG inhalation capsule Place 1 capsule (18 mcg total) into inhaler and inhale daily. 03/04/15 03/03/16 Yes Tanda Rockers, MD  UNABLE TO FIND Med Name: Med Pass 120 cc by mouth twice daily for malnutrition   Yes Historical Provider, MD  warfarin (COUMADIN) 2 MG tablet Take 2 mg by mouth daily.   Yes Historical Provider, MD   Allergies  Allergen Reactions  . Ace Inhibitors Cough  . Clarithromycin Other (See Comments)    Strange thoughts and fell with biaxin  . Codeine Nausea And Vomiting  . Oxycodone-Acetaminophen  Other (See Comments)    Felt closed in  . Viberzi [Eluxadoline] Other (See Comments)    Severe cramps and impaction    Review of Systems Positive for ongoing weakness, gradual progressive decline Physical Exam Weak elderly gentleman sitting up in bed awake alert Coarse rhonchorous breath sounds anterior lung fields S1-S2 Abdomen soft No edema Awake alert responds to questions appropriately  Vital Signs: BP 100/58 mmHg  Pulse 69  Temp(Src) 97.9 F (36.6 C) (Oral)  Resp 24  Ht 5\' 10"  (1.778 m)  Wt 59.7 kg (131 lb 9.8 oz)  BMI 18.88 kg/m2  SpO2 100%  SpO2: SpO2: 100 % O2 Device:SpO2: 100 % O2 Flow Rate: .O2 Flow Rate (L/min): 2 L/min  IO: Intake/output summary:  Intake/Output Summary (Last 24 hours) at 05/30/15 1329 Last data filed at 05/30/15 1140  Gross per 24 hour  Intake    542 ml  Output    825 ml  Net   -283 ml    LBM: Last BM Date:  (pta) Baseline Weight: Weight: 59.7 kg (131 lb 9.8 oz) Most recent weight: Weight: 59.7 kg (131 lb 9.8 oz)      Palliative Assessment/Data:  Flowsheet Rows        Most Recent Value   Intake Tab    Referral Department  Hospitalist   Unit at Time of Referral  Med/Surg Unit   Palliative Care Primary Diagnosis  Cardiac   Palliative Care Type  New Palliative care   Date first seen by Palliative Care  05/30/15   Clinical Assessment    Palliative Performance Scale Score  30%   Pain Max last 24 hours  4   Pain Min Last 24 hours  3   Dyspnea Max Last 24 Hours  3   Dyspnea Min Last 24 hours  2   Psychosocial & Spiritual Assessment    Social Work Plan of Care  Education on [facility]    Patient/Family meeting held?  Yes   Who was at the meeting?  patient son   [place] regarding hospice   Palliative Care follow-up planned  Yes, Facility      Additional Data Reviewed:  CBC:    Component Value Date/Time   WBC 6.4 05/30/2015 0605   WBC 8.7 07/07/2014   HGB 8.6* 05/30/2015  0605   HCT 26.5* 05/30/2015 0605   PLT 145* 05/30/2015 0605   MCV 85.2 05/30/2015 0605   NEUTROABS 3.9 05/04/2015 1205   LYMPHSABS 0.7 05/04/2015 1205   MONOABS 0.8 05/04/2015 1205   EOSABS 0.1 05/04/2015 1205   BASOSABS 0.0 05/04/2015 1205   Comprehensive Metabolic Panel:    Component Value Date/Time   NA 135 05/30/2015 0605   NA 136* 07/01/2014  K 4.1 05/30/2015 0605   CL 95* 05/30/2015 0605   CO2 30 05/30/2015 0605   BUN 55* 05/30/2015 0605   BUN 22* 07/01/2014   CREATININE 2.12* 05/30/2015 0605   CREATININE 1.4* 07/01/2014   GLUCOSE 142* 05/30/2015 0605   CALCIUM 8.3* 05/30/2015 0605   AST 25 05/30/2015 0605   ALT 17 05/30/2015 0605   ALKPHOS 121 05/30/2015 0605   BILITOT 1.6* 05/30/2015 0605   PROT 6.1* 05/30/2015 0605   ALBUMIN 2.3* 05/30/2015 0605     Time In: 10 Time Out: 11 Time Total: 60 Greater than 50%  of this time was spent counseling and coordinating care related to the above assessment and plan.  Signed by: Loistine Chance, MD [id number] Loistine Chance, MD  05/30/2015, 1:29 PM  Please contact Palliative Medicine Team phone at [phone] for questions and concerns.    Addendum: Received call from patient's son Aaron Edelman from [phone] at 1515. Aaron Edelman states that he has had extensive discussions with the patient. Plan: DO NOT RESUSCITATE/DO NOT INTUBATE Home with hospice Hospice and palliative care center of -case management consult placed Discharge home with hospice once all arrangements completed possibly in the next 24-48 hours

## 2015-05-30 NOTE — Progress Notes (Signed)
Weaned patient to 1L [place] and oxygen decreased to 86%. Increased to 2L [place] and oxygen returned to 100% [employer]. Brigitte Pulse, RN

## 2015-05-30 NOTE — Progress Notes (Addendum)
Weaned patient to RA, sats 92-94%. Will continue to monitor with cont pulse ox. [name]. Brigitte Pulse, RN

## 2015-05-31 LAB — PROTIME-INR
INR: 2.06 — AB (ref 0.00–1.49)
PROTHROMBIN TIME: 23.1 s — AB (ref 11.6–15.2)

## 2015-05-31 LAB — URINE CULTURE

## 2015-05-31 LAB — BASIC METABOLIC PANEL
ANION GAP: 9 (ref 5–15)
BUN: 51 mg/dL — ABNORMAL HIGH (ref 6–20)
CALCIUM: 8.2 mg/dL — AB (ref 8.9–10.3)
CO2: 29 mmol/L (ref 22–32)
CREATININE: 2 mg/dL — AB (ref 0.61–1.24)
Chloride: 97 mmol/L — ABNORMAL LOW (ref 101–111)
GFR, EST AFRICAN AMERICAN: 34 mL/min — AB (ref >60)
GFR, EST NON AFRICAN AMERICAN: 29 mL/min — AB (ref >60)
Glucose, Bld: 146 mg/dL — ABNORMAL HIGH (ref 65–99)
Potassium: 3.5 mmol/L (ref 3.5–5.1)
SODIUM: 135 mmol/L (ref 135–145)

## 2015-05-31 LAB — CBC WITH DIFFERENTIAL/PLATELET
BASOS ABS: 0 10*3/uL (ref 0.0–0.1)
BASOS PCT: 1 %
EOS ABS: 0.1 10*3/uL (ref 0.0–0.7)
Eosinophils Relative: 3 %
HEMATOCRIT: 27 % — AB (ref 39.0–52.0)
HEMOGLOBIN: 8.6 g/dL — AB (ref 13.0–17.0)
Lymphocytes Relative: 16 %
Lymphs Abs: 0.8 10*3/uL (ref 0.7–4.0)
MCH: 27.3 pg (ref 26.0–34.0)
MCHC: 31.9 g/dL (ref 30.0–36.0)
MCV: 85.7 fL (ref 78.0–100.0)
Monocytes Absolute: 0.7 10*3/uL (ref 0.1–1.0)
Monocytes Relative: 15 %
NEUTROS ABS: 3.2 10*3/uL (ref 1.7–7.7)
NEUTROS PCT: 65 %
Platelets: 145 10*3/uL — ABNORMAL LOW (ref 150–400)
RBC: 3.15 MIL/uL — AB (ref 4.22–5.81)
RDW: 18.8 % — ABNORMAL HIGH (ref 11.5–15.5)
WBC: 4.8 10*3/uL (ref 4.0–10.5)

## 2015-05-31 MED ORDER — WARFARIN SODIUM 1 MG PO TABS
1.0000 mg | ORAL_TABLET | Freq: Once | ORAL | Status: AC
  Administered 2015-05-31: 1 mg via ORAL
  Filled 2015-05-31: qty 1

## 2015-05-31 MED ORDER — CETYLPYRIDINIUM CHLORIDE 0.05 % MT LIQD
7.0000 mL | Freq: Two times a day (BID) | OROMUCOSAL | Status: DC
  Administered 2015-05-31 – 2015-06-01 (×2): 7 mL via OROMUCOSAL

## 2015-05-31 MED ORDER — FLUCONAZOLE 50 MG PO TABS: 50.0000 mg | ORAL_TABLET | Freq: Every day | ORAL | Status: AC

## 2015-05-31 MED ORDER — FUROSEMIDE 40 MG PO TABS: 20.0000 mg | ORAL_TABLET | Freq: Every day | ORAL | Status: AC

## 2015-05-31 MED ORDER — CLINDAMYCIN HCL 300 MG PO CAPS: 300.0000 mg | ORAL_CAPSULE | Freq: Three times a day (TID) | ORAL | Status: AC

## 2015-05-31 NOTE — Discharge Summary (Signed)
. Physician Discharge Summary  Edward [name] [id number] DOB: [birth year] DOA: 05/29/2015  PCP: Gwendolyn Grant, MD  Admit date: 05/29/2015 Discharge date: 05/31/2015  Time spent: 35 minutes  Recommendations for Outpatient Follow-up:  1. Complete clindamycin 300 tid 1/5 2. Complete fluconazole               " 3. HOme HOpsice requested 4. D/c home with home Oxygen 5. NO Mophine needed on d/c 6. Titrate oxygen and lasix to comfort-no more labs  Discharge Diagnoses:  Principal Problem:   Acute respiratory failure with hypoxemia ([place]) Active Problems:   CAD (coronary artery disease), native coronary artery   Atrial fibrillation (HCC)   Long-term (current) use of anticoagulants   Cancer of bladder wall (HCC)   Chronic combined systolic and diastolic heart failure (HCC)   Protein-calorie malnutrition, severe   Palliative care encounter   CKD (chronic kidney disease) stage 4, GFR 15-29 ml/min (HCC)   HCAP (healthcare-associated pneumonia)   Thrombocytopenia (HCC)   Anemia of chronic renal failure, stage 4 (severe) ([place])   Yeast UTI   Encounter for palliative care   Discharge Condition: garuded-prognosis < 1 mo  Diet recommendation: comfort  Filed Weights   05/29/15 1707 05/31/15 0439  Weight: 59.7 kg (131 lb 9.8 oz) 57.9 kg (127 lb 10.3 oz)    History of present illness:   82 y/o ? recent d/c from Hospital adm 12/5-12/13 c Acute /Chr Systolic HF Combined sys/diastolic HF, Most recent ECHO EF 15% 05/30/15, PA Peak 54 mm hg 2nd degree HB c PPM placement 12/20/12 Previous Bilat Carotid disease Mild AoS CABG with previous re-do bypass grafting Recent TURBT for bladder CA  ED work up BP 88/44, Tmax 100.5 HB 8.6, WBC 6.4, PLT 145 CXR New right basilar infiltrate and associated effusion ? Concerning aspiration Yeast in Urine   Started initially broad spectrum vanc Zosyn Felt reasonable to cover Funguria although no final result on Urine cult  PMT team saw patient  and delineated [place] to [place] and d/c home 05/31/15    Discharge Exam: Edward [name] Vitals:   05/30/15 2043 05/31/15 0439  BP: 88/50 92/55  Pulse: 71 87  Temp: 98 F (36.7 C) 97.9 F (36.6 C)  Resp: 20 18    General: alert more interactive "fiesty" Cardiovascular:  s1 s 2no m/r/g Respiratory: ? resonance RL post lung fields   Discharge Instructions   Discharge Instructions    Diet - low sodium heart healthy    Complete by:  As directed      Discharge instructions    Complete by:  As directed   He will need to complete 4 more days therapy for Pneumonia-please complete on 06/03/14 He had yeast in his urine-He will need Fluconazole for 4 more days ending 06/03/14 Would discontinue bisoprolol as heart rate cannot tolerate it Would continue Lasix daily 20 mg and use for comfort May visit Dr. Wynonia Lawman -no further specfic need to however other then social     Increase activity slowly    Complete by:  As directed           Current Discharge Medication List    START taking these medications   Details  clindamycin (CLEOCIN) 300 MG capsule Take 1 capsule (300 mg total) by mouth 3 (three) times daily. Qty: 12 capsule, Refills: 0    fluconazole (DIFLUCAN) 50 MG tablet Take 1 tablet (50 mg total) by mouth daily. Qty: 30 tablet, Refills: 0  CONTINUE these medications which have CHANGED   Details  furosemide (LASIX) 40 MG tablet Take 0.5 tablets (20 mg total) by mouth daily. Qty: 30 tablet, Refills: 0      CONTINUE these medications which have NOT CHANGED   Details  DULERA 100-5 MCG/ACT AERO Inhale 2 puffs into the lungs 2 (two) times daily. Qty: 3 Inhaler, Refills: 3    ipratropium-albuterol (DUONEB) 0.5-2.5 (3) MG/3ML SOLN Take 3 mLs by nebulization every 8 (eight) hours as needed (wheezing).    nitroGLYCERIN (NITROSTAT) 0.4 MG SL tablet Place 0.4 mg under the tongue every 5 (five) minutes as needed for chest pain.    polyethylene glycol (MIRALAX /  GLYCOLAX) packet Take 17 g by mouth daily. Qty: 14 each, Refills: 0    tiotropium (SPIRIVA) 18 MCG inhalation capsule Place 1 capsule (18 mcg total) into inhaler and inhale daily. Qty: 90 capsule, Refills: 3    UNABLE TO FIND Med Name: Med Pass 120 cc by mouth twice daily for malnutrition    warfarin (COUMADIN) 2 MG tablet Take 2 mg by mouth daily.      STOP taking these medications     bisoprolol (ZEBETA) 5 MG tablet        Allergies  Allergen Reactions  . Ace Inhibitors Cough  . Clarithromycin Other (See Comments)    Strange thoughts and fell with biaxin  . Codeine Nausea And Vomiting  . Oxycodone-Acetaminophen Other (See Comments)    Felt closed in  . Viberzi [Eluxadoline] Other (See Comments)    Severe cramps and impaction      The results of significant diagnostics from this hospitalization (including imaging, microbiology, ancillary and laboratory) are listed below for reference.    Significant Diagnostic Studies: Ct Abdomen Pelvis Wo Contrast  05/04/2015  CLINICAL DATA:  82 year old male with history of dyspnea and weight loss. EXAM: CT ABDOMEN AND PELVIS WITHOUT CONTRAST TECHNIQUE: Multidetector CT imaging of the abdomen and pelvis was performed following the standard protocol without IV contrast. COMPARISON:  CT the abdomen and pelvis 01/21/2015. FINDINGS: Lower chest: Small left and small to moderate right pleural effusions. Some areas of passive subsegmental atelectasis are noted in lung bases bilaterally. In addition, there is extensive bronchial wall thickening and thickening of the peribronchovascular interstitium, most pronounced in the right lower lobe where there is some associated peribronchovascular ground-glass attenuation. Cardiomegaly. Atherosclerotic calcifications in the left anterior descending, left circumflex and right coronary arteries. Pacemaker leads terminating in the right atrium and right ventricular apex. Hepatobiliary: No discrete cystic or solid  hepatic lesions are confidently identified on today's noncontrast CT examination. Status post cholecystectomy. Capsular calcification along the posterior surface of the right lobe of the liver is unchanged. Pancreas: No discrete pancreatic mass or peripancreatic inflammatory changes noted on today's noncontrast CT examination. Spleen: Unremarkable. Adrenals/Urinary Tract: Severe atrophy of the left kidney. Multiple low-attenuation lesions associated with the left kidney appears similar to the prior study from 01/21/2015, measuring up to 4.2 cm in the interpolar region, incompletely characterized on today's noncontrast CT examination, but previously characterized as cysts. Multiple other smaller low-attenuation lesions in the right kidney are incompletely characterized but similar to the prior examination. Vascular calcifications in both renal hila. No hydroureteronephrosis. Unenhanced appearance of the urinary bladder is unremarkable. Bilateral adrenal glands are normal in appearance. Stomach/Bowel: Unenhanced appearance of the stomach is normal. No pathologic dilatation of small bowel or colon. Vascular/Lymphatic: Extensive atherosclerotic calcifications are identified throughout the abdominal and pelvic vasculature. Again noted is a focal  outpouching of the left wall of the aorta near the origin of the left renal artery which measures up to 17 mm in diameter, which is similar to prior examinations. Enlarged right inguinal lymph node measuring 2.2 cm in short axis (image 79 of series 2). No other definite lymphadenopathy confidently identified in the abdomen or pelvis on today's noncontrast CT examination. Reproductive: Prostate gland and seminal vesicles are unremarkable in appearance. Other: Small to moderate volume of ascites. Mild diffuse mesenteric edema. No pneumoperitoneum. Musculoskeletal: Diffuse body wall edema. There are no aggressive appearing lytic or blastic lesions noted in the visualized portions of  the skeleton. Old compression fractures of superior endplate of L1 and inferior endplate of L3 are again noted, both of which demonstrate approximately 30% loss of anterior vertebral body height. Bilateral pars defects at L5. 4 mm of anterolisthesis of L5 upon S1. There are no aggressive appearing lytic or blastic lesions noted in the visualized portions of the skeleton. IMPRESSION: 1. The appearance of the abdomen and pelvis suggest a state of anasarca, as evidenced by edematous changes in the visualized lung bases, bilateral pleural effusions, small to moderate volume of ascites, diffuse mesenteric edema and diffuse body wall edema. 2. No definite acute findings noted in the abdomen or pelvis on today's noncontrast CT examination. 3. Extensive atherosclerosis, including multivessel coronary artery disease. 4. Additional incidental findings, as above. Electronically Signed   By: Vinnie Langton M.D.   On: 05/04/2015 17:43   Dg Chest 2 View  05/29/2015  CLINICAL DATA:  Productive cough EXAM: CHEST - 2 VIEW COMPARISON:  05/04/2015 FINDINGS: Cardiac shadow is again enlarged. A pacing device is again seen and stable. Thickening of the minor fissure is again noted on the right and stable. New right basilar infiltrate and small effusion is seen. The left lung is clear. Mild interstitial changes are again identified similar to that seen on the prior exam. IMPRESSION: New right basilar infiltrate and associated effusion. Superimposed mild interstitial changes stable from the prior exam. Electronically Signed   By: Inez Catalina M.D.   On: 05/29/2015 13:57   Dg Chest 2 View  05/04/2015  CLINICAL DATA:  Shortness of Breath EXAM: CHEST - 2 VIEW COMPARISON:  04/21/2015 FINDINGS: Cardiac shadow remains mildly enlarged. Postsurgical changes are again seen as is a pacing device. Increased vascular congestion and interstitial edema is noted. Some increased density in the right lung base is seen which may represent some  superimposed infiltrate. There remains thickening of the minor and major fissures on right. IMPRESSION: Changes consistent with CHF and likely mild early basilar infiltrate on the right. Electronically Signed   By: Inez Catalina M.D.   On: 05/04/2015 16:58    Microbiology: Recent Results (from the past 240 hour(s))  Culture, blood (routine x 2) Call MD if unable to obtain prior to antibiotics being given     Status: None (Preliminary result)   Collection Time: 05/29/15  4:39 PM  Result Value Ref Range Status   Specimen Description BLOOD LEFT ANTECUBITAL  Final   Special Requests BOTTLES DRAWN AEROBIC AND ANAEROBIC 5ML  Final   Culture   Final    NO GROWTH < 24 HOURS Performed at [facility]    Report Status PENDING  Incomplete  Culture, blood (routine x 2) Call MD if unable to obtain prior to antibiotics being given     Status: None (Preliminary result)   Collection Time: 05/29/15  4:39 PM  Result Value Ref Range Status  Specimen Description BLOOD BLOOD LEFT HAND  Final   Special Requests BOTTLES DRAWN AEROBIC AND ANAEROBIC 5CC  Final   Culture   Final    NO GROWTH < 24 HOURS Performed at [facility]    Report Status PENDING  Incomplete     Labs: Basic Metabolic Panel:  Recent Labs Lab 05/29/15 1326 05/30/15 0605 05/31/15 0638  NA 133* 135 135  K 3.6 4.1 3.5  CL 95* 95* 97*  CO2 28 30 29   GLUCOSE 168* 142* 146*  BUN 58* 55* 51*  CREATININE 2.19* 2.12* 2.00*  CALCIUM 8.3* 8.3* 8.2*   Liver Function Tests:  Recent Labs Lab 05/30/15 0605  AST 25  ALT 17  ALKPHOS 121  BILITOT 1.6*  PROT 6.1*  ALBUMIN 2.3*   No results for input(s): LIPASE, AMYLASE in the last 168 hours. No results for input(s): AMMONIA in the last 168 hours. CBC:  Recent Labs Lab 05/29/15 1326 05/30/15 0605 05/31/15 0638  WBC 6.8 6.4 4.8  NEUTROABS  --   --  3.2  HGB 9.9* 8.6* 8.6*  HCT 30.3* 26.5* 27.0*  MCV 86.6 85.2 85.7  PLT 148* 145* 145*   Cardiac Enzymes: No  results for input(s): CKTOTAL, CKMB, CKMBINDEX, TROPONINI in the last 168 hours. BNP: BNP (last 3 results)  Recent Labs  06/24/14 1445 05/04/15 1205  BNP 1232.5* 2095.0*    ProBNP (last 3 results) No results for input(s): PROBNP in the last 8760 hours.  CBG: No results for input(s): GLUCAP in the last 168 hours.     SignedNita Sells MD   [facility] 05/31/2015, 11:38 AM

## 2015-05-31 NOTE — Progress Notes (Signed)
Daily Progress Note   Patient Name: Edward [name]       Date: 05/31/2015 DOB: October 31, 1929  Age: 82 y.o. MRN#: [id number] Attending Physician: Nita Sells, MD Primary Care Physician: Gwendolyn Grant, MD Admit Date: 05/29/2015  Reason for Consultation/Follow-up: Establishing goals of care  Subjective:  resting comfortably in bed, in no distress no family at bedside this am  Interval Events: Call placed, discussed with case manager Alesia: home with hospice arrangements today Likely D/C within 24 hours.  Ok to transition to PO antibiotic at time of D/C  Length of Stay: 2 days  Current Medications: Scheduled Meds:  . ceFEPime (MAXIPIME) IV  1 g Intravenous Q24H  . fluconazole  50 mg Oral Daily  . lactose free nutrition  237 mL Oral TID WC  . vancomycin  500 mg Intravenous Q24H  . Warfarin - Pharmacist Dosing Inpatient   Does not apply q1800    Continuous Infusions: . sodium chloride 40 mL/hr at 05/30/15 0653    PRN Meds:   Physical Exam: Physical Exam             Resting in bed, does not arouse to gentle voice command Regular pattern of breathing Detailed physical exam not performed  Vital Signs: BP 92/55 mmHg  Pulse 87  Temp(Src) 97.9 F (36.6 C) (Oral)  Resp 18  Ht 5\' 10"  (1.778 m)  Wt 57.9 kg (127 lb 10.3 oz)  BMI 18.32 kg/m2  SpO2 92% SpO2: SpO2: 92 % O2 Device: O2 Device: Nasal Cannula O2 Flow Rate: O2 Flow Rate (L/min): 0.5 L/min  Intake/output summary:  Intake/Output Summary (Last 24 hours) at 05/31/15 0900 Last data filed at 05/31/15 0700  Gross per 24 hour  Intake   1500 ml  Output    750 ml  Net    750 ml   LBM: Last BM Date: 05/31/15 Baseline Weight: Weight: 59.7 kg (131 lb 9.8 oz) Most recent weight: Weight: 57.9 kg (127 lb 10.3  oz)       Palliative Assessment/Data: Flowsheet Rows        Most Recent Value   Intake Tab    Referral Department  Hospitalist   Unit at Time of Referral  Med/Surg Unit   Palliative Care Primary Diagnosis  Cardiac   Palliative Care Type  Return patient Palliative  Care   Reason for referral  Clarify Goals of Care, Counsel Regarding Hospice   Date first seen by Palliative Care  05/30/15   Clinical Assessment    Palliative Performance Scale Score  30%   Pain Max last 24 hours  3   Pain Min Last 24 hours  2   Dyspnea Max Last 24 Hours  2   Dyspnea Min Last 24 hours  1   Psychosocial & Spiritual Assessment    Social Work Plan of Care  Clarified patient/family wishes with healthcare team   Palliative Care Outcomes    Patient/Family meeting held?  No   Who was at the meeting?  patient son   Palliative Care Outcomes  Clarified goals of care   Palliative Care follow-up planned  Yes, Facility      Additional Data Reviewed: CBC    Component Value Date/Time   WBC 4.8 05/31/2015 0638   WBC 8.7 07/07/2014   RBC 3.15* 05/31/2015 0638   HGB 8.6* 05/31/2015 0638   HCT 27.0* 05/31/2015 0638   PLT 145* 05/31/2015 0638   MCV 85.7 05/31/2015 0638   MCH 27.3 05/31/2015 0638   MCHC 31.9 05/31/2015 0638   RDW 18.8* 05/31/2015 0638   LYMPHSABS 0.8 05/31/2015 0638   MONOABS 0.7 05/31/2015 0638   EOSABS 0.1 05/31/2015 0638   BASOSABS 0.0 05/31/2015 0638    CMP     Component Value Date/Time   NA 135 05/31/2015 0638   NA 136* 07/01/2014   K 3.5 05/31/2015 0638   CL 97* 05/31/2015 0638   CO2 29 05/31/2015 0638   GLUCOSE 146* 05/31/2015 0638   BUN 51* 05/31/2015 0638   BUN 22* 07/01/2014   CREATININE 2.00* 05/31/2015 0638   CREATININE 1.4* 07/01/2014   CALCIUM 8.2* 05/31/2015 0638   PROT 6.1* 05/30/2015 0605   ALBUMIN 2.3* 05/30/2015 0605   AST 25 05/30/2015 0605   ALT 17 05/30/2015 0605   ALKPHOS 121 05/30/2015 0605   BILITOT 1.6* 05/30/2015 0605   GFRNONAA 29* 05/31/2015 0638    GFRAA 34* 05/31/2015 0638       Problem List:  Patient Active Problem List   Diagnosis Date Noted  . Encounter for palliative care   . Acute respiratory failure with hypoxemia ([place]) 05/29/2015  . CKD (chronic kidney disease) stage 4, GFR 15-29 ml/min (HCC) 05/29/2015  . HCAP (healthcare-associated pneumonia) 05/29/2015  . Thrombocytopenia ([place]) 05/29/2015  . Anemia of chronic renal failure, stage 4 (severe) ([place]) 05/29/2015  . Yeast UTI 05/29/2015  . Palliative care encounter 05/07/2015  . Protein-calorie malnutrition, severe 05/05/2015  . Chronic combined systolic and diastolic heart failure ([place])   . Cancer of bladder wall ([place]) 06/16/2014  . Atrial fibrillation ([place]) 03/28/2013  . Long-term (current) use of anticoagulants 03/28/2013  . CAD (coronary artery disease), native coronary artery      Palliative Care Assessment & Plan    1.Code Status:  DNR    Code Status Orders        Start     Ordered   05/30/15 [id number]  Do not attempt resuscitation (DNR)   Continuous    Question Answer Comment  In the event of cardiac or respiratory ARREST Do not call a "code blue"   In the event of cardiac or respiratory ARREST Do not perform Intubation, CPR, defibrillation or ACLS   In the event of cardiac or respiratory ARREST Use medication by any route, position, wound care, and other measures to relive pain and suffering.  May use oxygen, suction and manual treatment of airway obstruction as needed for comfort.      05/30/15 1549    Advance Directive Documentation        Most Recent Value   Type of Advance Directive  Living will   Pre-existing out of facility DNR order (yellow form or pink MOST form)     "MOST" Form in Place?         2. Goals of Care/Additional Recommendations:   home with hospice once arrangements complete, discussed with on call case manager Alesia  Limitations on Scope of Treatment: dnr dni  Desire for further Chaplaincy support:no  Psycho-social  Needs: Caregiving  Support/Resources  3. Symptom Management:      1. As above   4. Palliative Prophylaxis:   Delirium Protocol  5. Prognosis: less than 6 months   6. Discharge Planning:  Home with Hospice   Care plan was discussed with  Case manager, Dr Verlon Au  Thank you for allowing the Palliative Medicine Team to assist in the care of this patient.   Time In:  0830 Time Out: 0855 Total Time 25 Prolonged Time Billed  no        [id number] Loistine Chance, MD  05/31/2015, 9:00 AM  Please contact Palliative Medicine Team phone at [phone] for questions and concerns.

## 2015-05-31 NOTE — Progress Notes (Signed)
PT Cancellation Note  Patient Details Name: Edward [name] MRN: [id number] DOB: 29-Jan-1930   Cancelled Treatment:    Reason Eval/Treat Not Completed: PT screened, no needs identified, will sign off   [facility] 05/31/2015, 3:58 PM

## 2015-05-31 NOTE — Progress Notes (Signed)
ANTICOAGULATION CONSULT NOTE - follow-up Consult  Pharmacy Consult for Warfarin Indication: atrial fibrillation  Allergies  Allergen Reactions  . Ace Inhibitors Cough  . Clarithromycin Other (See Comments)    Strange thoughts and fell with biaxin  . Codeine Nausea And Vomiting  . Oxycodone-Acetaminophen Other (See Comments)    Felt closed in  . Viberzi [Eluxadoline] Other (See Comments)    Severe cramps and impaction   Patient Measurements: Height: 5\' 10"  (177.8 cm) Weight: 127 lb 10.3 oz (57.9 kg) IBW/kg (Calculated) : 73 Total body weight 55.2kg  Vital Signs: Temp: 99 F (37.2 C) (01/01 1414) Temp Source: Oral (01/01 1414) BP: 101/80 mmHg (01/01 1414) Pulse Rate: 70 (01/01 1414)  Labs:  Recent Labs  05/29/15 1326 05/29/15 1642 05/30/15 0605 05/31/15 0638  HGB 9.9*  --  8.6* 8.6*  HCT 30.3*  --  26.5* 27.0*  PLT 148*  --  145* 145*  LABPROT  --  18.0* 19.0* 23.1*  INR  --  1.48 1.59* 2.06*  CREATININE 2.19*  --  2.12* 2.00*   Estimated Creatinine Clearance: 22.9 mL/min (by C-G formula based on Cr of 2).  Medical History: Complex medical history: including A Flutter, CAD/CABG, PVD, Aortic valve disorder, HF, CKD Medications:  Scheduled:  . antiseptic oral rinse  7 mL Mouth Rinse BID  . ceFEPime (MAXIPIME) IV  1 g Intravenous Q24H  . fluconazole  50 mg Oral Daily  . lactose free nutrition  237 mL Oral TID WC  . vancomycin  500 mg Intravenous Q24H  . Warfarin - Pharmacist Dosing Inpatient   Does not apply q1800   Anti-infectives    Start     Dose/Rate Route Frequency Ordered Stop   05/31/15 0000  fluconazole (DIFLUCAN) 50 MG tablet     50 mg Oral Daily 05/31/15 1137     05/31/15 0000  clindamycin (CLEOCIN) 300 MG capsule     300 mg Oral 3 times daily 05/31/15 1137     05/30/15 1800  fluconazole (DIFLUCAN) IVPB 50 mg  Status:  Discontinued     50 mg 25 mL/hr over 60 Minutes Intravenous Every 24 hours 05/29/15 1654 05/29/15 1824   05/30/15 1800   fluconazole (DIFLUCAN) IVPB 100 mg  Status:  Discontinued     100 mg 50 mL/hr over 60 Minutes Intravenous Every 24 hours 05/29/15 1824 05/30/15 1554   05/30/15 1700  vancomycin (VANCOCIN) 500 mg in sodium chloride 0.9 % 100 mL IVPB     500 mg 100 mL/hr over 60 Minutes Intravenous Every 24 hours 05/29/15 1655 06/06/15 1659   05/30/15 1700  fluconazole (DIFLUCAN) tablet 50 mg     50 mg Oral Daily 05/30/15 1554     05/29/15 2200  ceFEPIme (MAXIPIME) 1 g in dextrose 5 % 50 mL IVPB     1 g 100 mL/hr over 30 Minutes Intravenous Every 24 hours 05/29/15 1618 06/06/15 2159   05/29/15 1800  fluconazole (DIFLUCAN) IVPB 100 mg  Status:  Discontinued     100 mg 50 mL/hr over 60 Minutes Intravenous Every 24 hours 05/29/15 1618 05/29/15 1653   05/29/15 1800  fluconazole (DIFLUCAN) IVPB 100 mg     100 mg 50 mL/hr over 60 Minutes Intravenous Every 24 hours 05/29/15 1653 05/29/15 1950   05/29/15 1700  ceFEPIme (MAXIPIME) 1 g in dextrose 5 % 50 mL IVPB  Status:  Discontinued     1 g 100 mL/hr over 30 Minutes Intravenous Every 24 hours 05/29/15 1610 05/29/15 1618  05/29/15 1600  vancomycin (VANCOCIN) IVPB 1000 mg/200 mL premix  Status:  Discontinued     1,000 mg 200 mL/hr over 60 Minutes Intravenous  Once 05/29/15 1536 05/29/15 1705   05/29/15 1545  piperacillin-tazobactam (ZOSYN) IVPB 3.375 g  Status:  Discontinued     3.375 g 100 mL/hr over 30 Minutes Intravenous  Once 05/29/15 1534 05/29/15 1635     Assessment: 89 yoM SNF resident, to ED for hypotension, was given fluid bolus at facility. CXray on admit: new R base infiltrate, effusion. Admit for Vancomycin, Cefepime. On Warfarin 2mg  daily for AFib (last dose 12/29) until next INR check, last INR low at 1.5 (per RN at facility); usual Warfarin dose 0.5mg  daily per facility.    Today's labs, 05/31/2015:  INR therapeutic, up from baseline  CBC: Hgb down from baseline, pltc mildly decreased/stable from baseline  DDI: Fluconazole added, can increase  PT/INR  Goal of Therapy:  INR 2-3 Monitor platelets by anticoagulation protocol: Yes   Plan:   Warfarin 1mg  today - reduce for comitant fluconazole  Daily PT/INR -watch trend closely as was recently on 0.5mg  daily with therapeutic INR  Note Fluconazole can increase INR, 5 day course beginning 12/30, typically do not treat yeast in urine unless neutropenic or undergoing urologic procedure.  If has bladder catheter, remove if possible or exchange if needed.    Dolly Rias RPh 05/31/2015, 7:03 PM Pager [phone]

## 2015-05-31 NOTE — Care Management Note (Addendum)
Case Management Note  Patient Details  Name: Edward [name] MRN: [id number] Date of Birth: Apr 11, 1930  Subjective/Objective:     Acute respiratory failure with hypoxemia                Action/Plan: NCM spoke to pt and gave permission to speak to son, Issachar [place] # [phone]. Provided Hospice agency list. Son requested [facility] for Home Hospice. States he and his brother live in the home with pt. Requesting oxygen and hospital bed for home. Contacted [facility] with new referral. Faxed referral to [facility].  [phone] Received call back from [facility], unable to service today due to holiday, will be able to arrange set up on 06/01/2015. Attending notified.   Expected Discharge Date:  06/01/2015               Expected Discharge Plan:  Home w Hospice Care  In-House Referral:  NA  Discharge planning Services  CM Consult  Post Acute Care Choice:  Hospice Choice offered to:  Adult Children  DME Arranged:  Oxygen, hospital bed DME Agency:  [place]:  RN [facility] Agency:  [facility]  Status of Service:  Completed, signed off  Medicare Important Message Given:    Date Medicare IM Given:    Medicare IM give by:    Date Additional Medicare IM Given:    Additional Medicare Important Message give by:     If discussed at [place] of Stay Meetings, dates discussed:    Additional Comments:  Erenest Rasher, RN 05/31/2015, 2:16 PM

## 2015-06-01 LAB — PROTIME-INR
INR: 2.3 — ABNORMAL HIGH (ref 0.00–1.49)
PROTHROMBIN TIME: 25.1 s — AB (ref 11.6–15.2)

## 2015-06-01 LAB — LEGIONELLA ANTIGEN, URINE

## 2015-06-01 MED ORDER — WARFARIN SODIUM 1 MG PO TABS
1.0000 mg | ORAL_TABLET | Freq: Once | ORAL | Status: DC
  Filled 2015-06-01: qty 1

## 2015-06-01 NOTE — Progress Notes (Signed)
Informed by central telemetry that pt had 6 beat run of VTach, pt asymptomatic, vital signs stable, pt sleeping. Notified on call PA. No new orders given. Will continue to monitor pt.  Janell Quiet, RN

## 2015-06-01 NOTE — Care Management Note (Signed)
Case Management Note  Patient Details  Name: Edward [name] MRN: [id number] Date of Birth: 06/12/1929  Subjective/Objective:[facility] has accepted-they will manage delivery of dme-see their note. Family able to transp home.                    Action/Plan:d/c home w/hospice.   Expected Discharge Date:                  Expected Discharge Plan:  Home w Hospice Care  In-House Referral:  NA  Discharge planning Services  CM Consult  Post Acute Care Choice:  Hospice Choice offered to:  Adult Children  DME Arranged:  Oxygen DME Agency:  [place] Arranged:  RN [facility] Agency:  [facility]  Status of Service:  Completed, signed off  Medicare Important Message Given:    Date Medicare IM Given:    Medicare IM give by:    Date Additional Medicare IM Given:    Additional Medicare Important Message give by:     If discussed at [place] of Stay Meetings, dates discussed:    Additional Comments:  Dessa Phi, RN 06/01/2015, 11:37 AM

## 2015-06-01 NOTE — Care Management Note (Signed)
Case Management Note  Patient Details  Name: Edward [name] MRN: [id number] Date of Birth: 1929/09/04  Subjective/Objective:  Spoke to Bryan(son)c#[phone] [id number] about d/c plans-[facility] to confirm if able to accept-I have called referral line spoke to Amy-informed Stacey([facility] liason)-await acceptance. DME needed-home 02, hospital bed-to be delivered prior to d/c.  Son says he can transport on his own.Await [facility] to confirm acceptance.                  Action/Plan:   Expected Discharge Date:                  Expected Discharge Plan:  Home w Hospice Care  In-House Referral:  NA  Discharge planning Services  CM Consult  Post Acute Care Choice:  Hospice Choice offered to:  Adult Children  DME Arranged:  Oxygen DME Agency:  [place] Arranged:  RN [facility] Agency:  [facility]  Status of Service:  Completed, signed off  Medicare Important Message Given:    Date Medicare IM Given:    Medicare IM give by:    Date Additional Medicare IM Given:    Additional Medicare Important Message give by:     If discussed at [name] of Stay Meetings, dates discussed:    Additional Comments:  Dessa Phi, RN 06/01/2015, 8:47 AM

## 2015-06-01 NOTE — Progress Notes (Signed)
Notified by Juliann Pulse [facility] of family request for Hospice and [place] services at home after discharge. Chart and patient Information currently under review to confirm hospice eligibility.   Spoke with patient, at bedside and son Gaspar Bidding via phone conversation to initiate education related to hospice philosophy, services and team approach to care. Family verbalized understanding of the information provided. Per discussion plan is for discharge to home by personal vehicle with son today.   Please send signed completed DNR form home with patient.  Patient will need prescriptions for discharge comfort medications.   DME needs discussed and family requested hospital bed with full rails, over bed table and oxygen package with concentrator for delivery to the home today.  [facility] equipment manager Jewel Ysidro Evert notified and will contact [place] to arrange delivery to the home.  The home address has been verified and is correct in the chart; Gaspar Bidding family member to be contacted to arrange time of delivery.   [facility]erral [facility] aware of the above.  Completed discharge summary will need to be faxed to [facility] at [phone] when final.   Please notify [facility] when patient is ready to leave unit at discharge-call [phone].  [facility] information and contact numbers have been given to [place].   Please call with any questions.  Annia Belt RN, [facility] Liaison  [phone]

## 2015-06-01 NOTE — Progress Notes (Signed)
Patient and caregive given discharge instructions, and verbalized an understanding of all discharge instructions.  Patient agrees with discharge plan, and is being discharged in stable medical condition.  Patient given transportation via wheelchair.  Durwin Nora RN

## 2015-06-01 NOTE — Progress Notes (Signed)
ANTICOAGULATION and ANTIBIOTIC CONSULT NOTE - follow-up Consult  Pharmacy Consult for Warfarin; vacnomycin and cefepime Indication: atrial fibrillation  Allergies  Allergen Reactions  . Ace Inhibitors Cough  . Clarithromycin Other (See Comments)    Strange thoughts and fell with biaxin  . Codeine Nausea And Vomiting  . Oxycodone-Acetaminophen Other (See Comments)    Felt closed in  . Viberzi [Eluxadoline] Other (See Comments)    Severe cramps and impaction   Patient Measurements: Height: 5\' 10"  (177.8 cm) Weight: 129 lb 13.6 oz (58.9 kg) IBW/kg (Calculated) : 73 Total body weight 55.2kg  Vital Signs: Temp: 98.1 F (36.7 C) (01/02 0417) Temp Source: Oral (01/02 0417) BP: 102/60 mmHg (01/02 0417) Pulse Rate: 65 (01/02 0417)  Labs:  Recent Labs  05/29/15 1326  05/30/15 0605 05/31/15 0638 06/01/15 0550  HGB 9.9*  --  8.6* 8.6*  --   HCT 30.3*  --  26.5* 27.0*  --   PLT 148*  --  145* 145*  --   LABPROT  --   < > 19.0* 23.1* 25.1*  INR  --   < > 1.59* 2.06* 2.30*  CREATININE 2.19*  --  2.12* 2.00*  --   < > = values in this interval not displayed. Estimated Creatinine Clearance: 23.3 mL/min (by C-G formula based on Cr of 2).  Medical History: Complex medical history: including A Flutter, CAD/CABG, PVD, Aortic valve disorder, HF, CKD Medications:  Scheduled:  . antiseptic oral rinse  7 mL Mouth Rinse BID  . ceFEPime (MAXIPIME) IV  1 g Intravenous Q24H  . fluconazole  50 mg Oral Daily  . lactose free nutrition  237 mL Oral TID WC  . vancomycin  500 mg Intravenous Q24H  . Warfarin - Pharmacist Dosing Inpatient   Does not apply q1800   Anti-infectives    Start     Dose/Rate Route Frequency Ordered Stop   05/31/15 0000  fluconazole (DIFLUCAN) 50 MG tablet     50 mg Oral Daily 05/31/15 1137     05/31/15 0000  clindamycin (CLEOCIN) 300 MG capsule     300 mg Oral 3 times daily 05/31/15 1137     05/30/15 1800  fluconazole (DIFLUCAN) IVPB 50 mg  Status:  Discontinued      50 mg 25 mL/hr over 60 Minutes Intravenous Every 24 hours 05/29/15 1654 05/29/15 1824   05/30/15 1800  fluconazole (DIFLUCAN) IVPB 100 mg  Status:  Discontinued     100 mg 50 mL/hr over 60 Minutes Intravenous Every 24 hours 05/29/15 1824 05/30/15 1554   05/30/15 1700  vancomycin (VANCOCIN) 500 mg in sodium chloride 0.9 % 100 mL IVPB     500 mg 100 mL/hr over 60 Minutes Intravenous Every 24 hours 05/29/15 1655 06/06/15 1659   05/30/15 1700  fluconazole (DIFLUCAN) tablet 50 mg     50 mg Oral Daily 05/30/15 1554     05/29/15 2200  ceFEPIme (MAXIPIME) 1 g in dextrose 5 % 50 mL IVPB     1 g 100 mL/hr over 30 Minutes Intravenous Every 24 hours 05/29/15 1618 06/06/15 2159   05/29/15 1800  fluconazole (DIFLUCAN) IVPB 100 mg  Status:  Discontinued     100 mg 50 mL/hr over 60 Minutes Intravenous Every 24 hours 05/29/15 1618 05/29/15 1653   05/29/15 1800  fluconazole (DIFLUCAN) IVPB 100 mg     100 mg 50 mL/hr over 60 Minutes Intravenous Every 24 hours 05/29/15 1653 05/29/15 1950   05/29/15 1700  ceFEPIme (MAXIPIME)  1 g in dextrose 5 % 50 mL IVPB  Status:  Discontinued     1 g 100 mL/hr over 30 Minutes Intravenous Every 24 hours 05/29/15 1610 05/29/15 1618   05/29/15 1600  vancomycin (VANCOCIN) IVPB 1000 mg/200 mL premix  Status:  Discontinued     1,000 mg 200 mL/hr over 60 Minutes Intravenous  Once 05/29/15 1536 05/29/15 1705   05/29/15 1545  piperacillin-tazobactam (ZOSYN) IVPB 3.375 g  Status:  Discontinued     3.375 g 100 mL/hr over 30 Minutes Intravenous  Once 05/29/15 1534 05/29/15 1635     Assessment: 13 yoM SNF resident, to ED for hypotension, was given fluid bolus at facility. CXray on admit: new R base infiltrate, effusion. Admit for Vancomycin, Cefepime. On Warfarin 2mg  daily for AFib (last dose 12/29) until next INR check, last INR low at 1.5 (per RN at facility); usual Warfarin dose 0.5mg  daily per facility.    He's also on vancomycin and cefepime for suspected PNA.  - afeb,  wbc wnl, scr stable at 2 (crcl~23) on 1/1  Antimicrobials this admission: 12/30 Vanc >>  12/30 Cefepime >>  12/30 Fluconazole (for UTI)>>  Microbiology Results: 12/30 BCx: NGTD 12/31 UCx: insignif. growth 12/30 Strep pneumo/Legionella  Today's labs, 06/01/2015:  INR remains therapeutic at 2.30 today  No new cbc today but was relatively stable on 1/1  DDI: Fluconazole added, can increase PT/INR  Goal of Therapy:  INR 2-3 Monitor platelets by anticoagulation protocol: Yes   Plan:   Repeat Warfarin 1mg  today   Daily PT/INR  Note Fluconazole can increase INR, 5 day course beginning 12/30, typically do not treat yeast in urine unless neutropenic or undergoing urologic procedure.  If has bladder catheter, remove if possible or exchange if needed.   Continue vancomycin 500 mg IV q24h and cefepime 1gm IV q24h.  Will hold off on checking vancomycin level as plan is to discharge patient on clindamycin (possible d/c to home hospice today)   Monitor renal function closely.  Dia Sitter, PharmD, BCPS 06/01/2015 8:47 AM

## 2015-06-03 LAB — CULTURE, BLOOD (ROUTINE X 2)
Culture: NO GROWTH
Culture: NO GROWTH

## 2015-09-28 DEATH — deceased

## 2016-10-21 IMAGING — US US RENAL
1 series · 13 of 25 positions shown · non-contrast
Comparison: CT abdomen and pelvis June 11, 2014

CLINICAL DATA: Renal failure. Recent transurethral resection of
bladder tumor

EXAM:
RENAL/URINARY TRACT ULTRASOUND COMPLETE

[Series 1: us renal · 0.28mm/px · 13 of 38 slices shown]
[im 1/38]
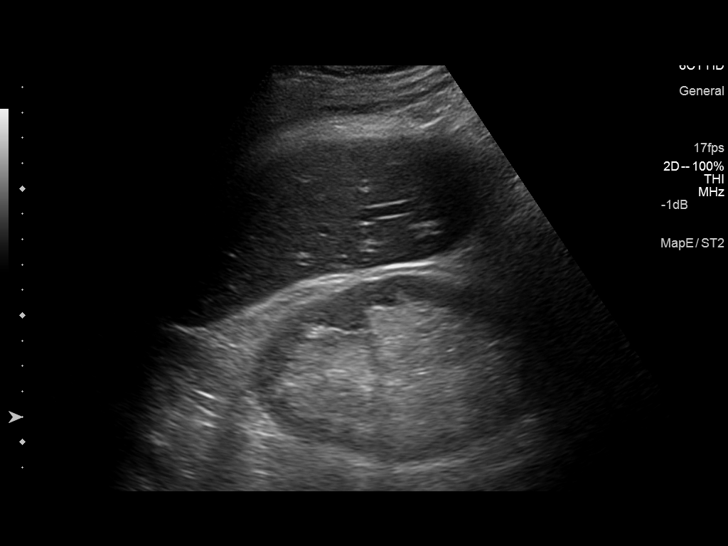
[im 4/38]
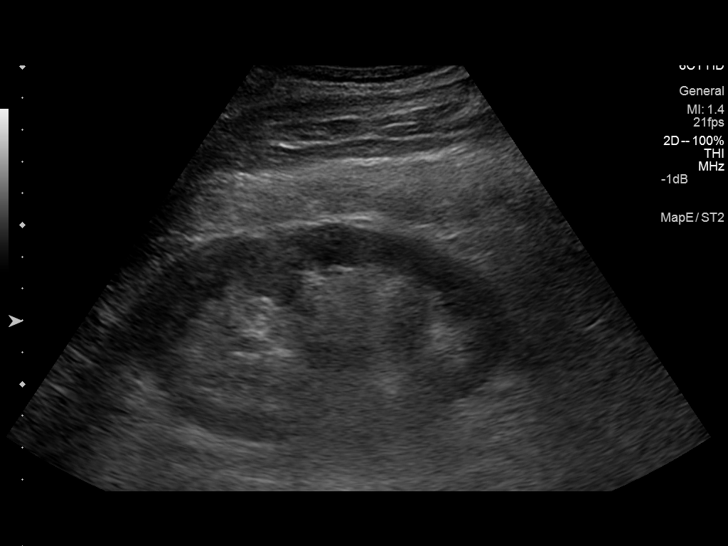
[im 7/38]
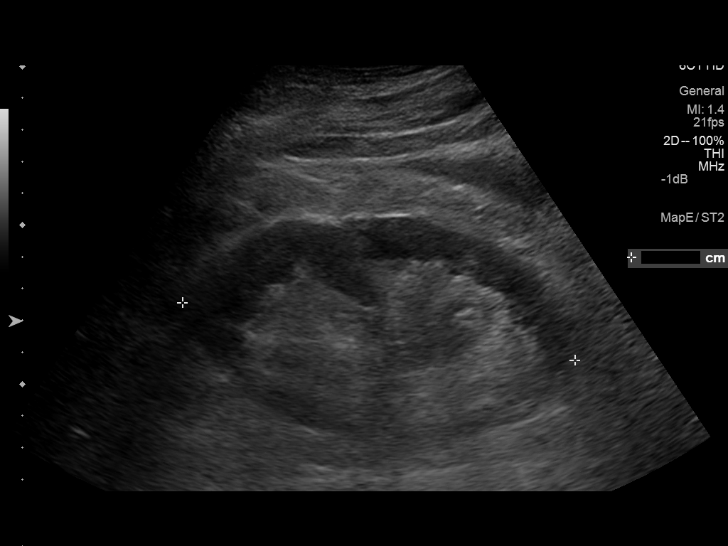
[im 10/38]
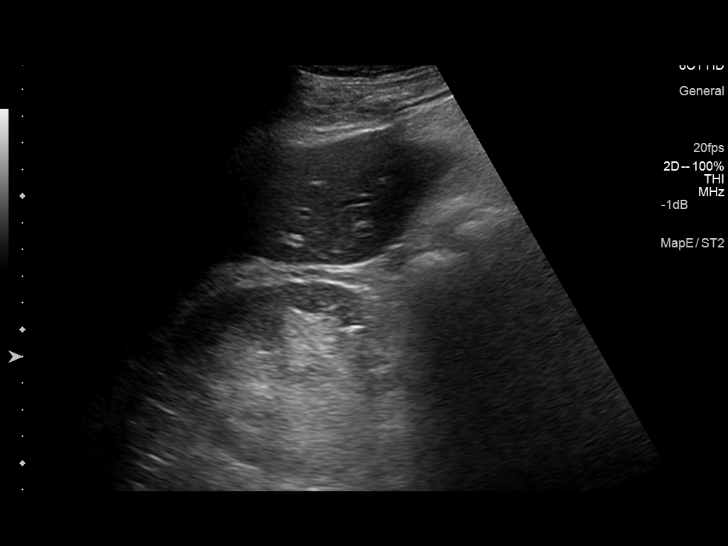
[im 13/38]
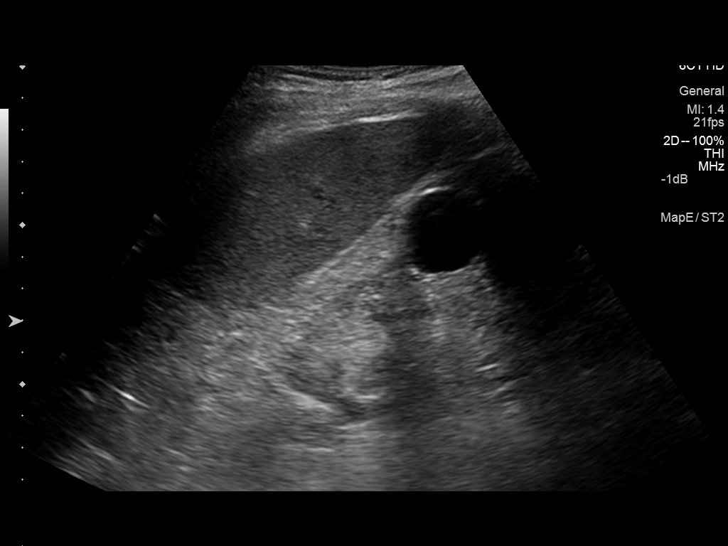
[im 16/38]
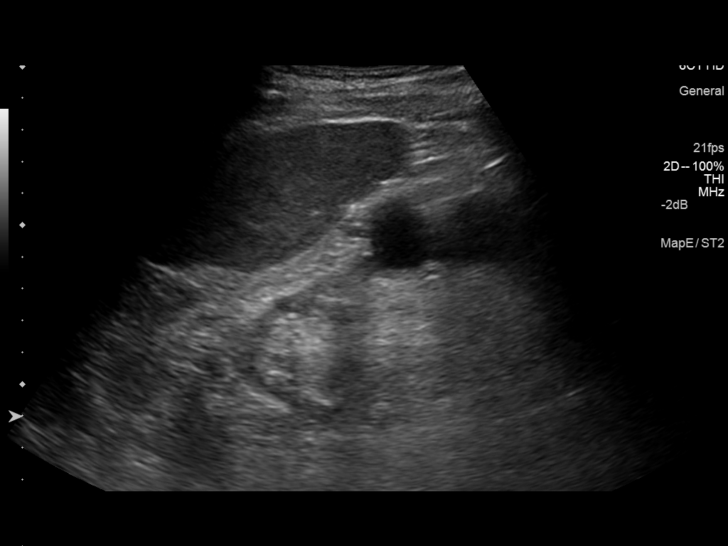
[im 19/38]
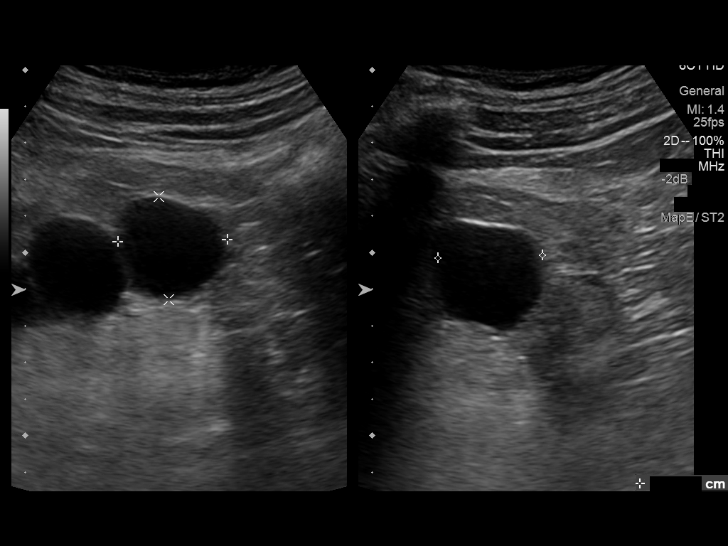
[im 22/38]
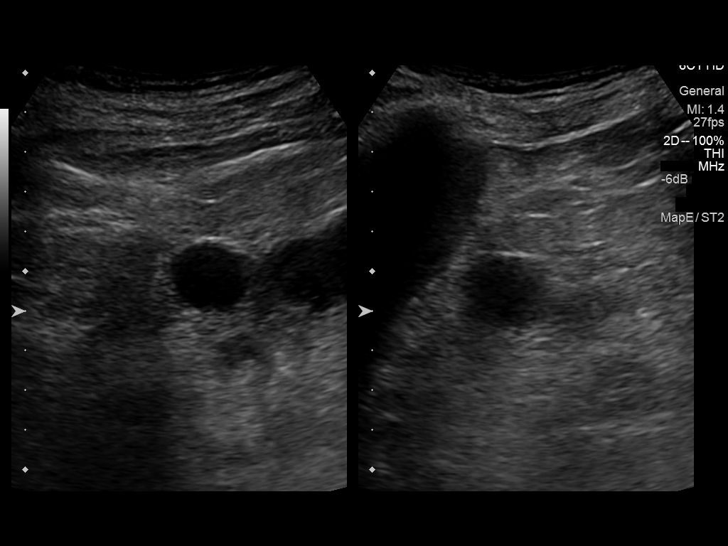
[im 25/38]
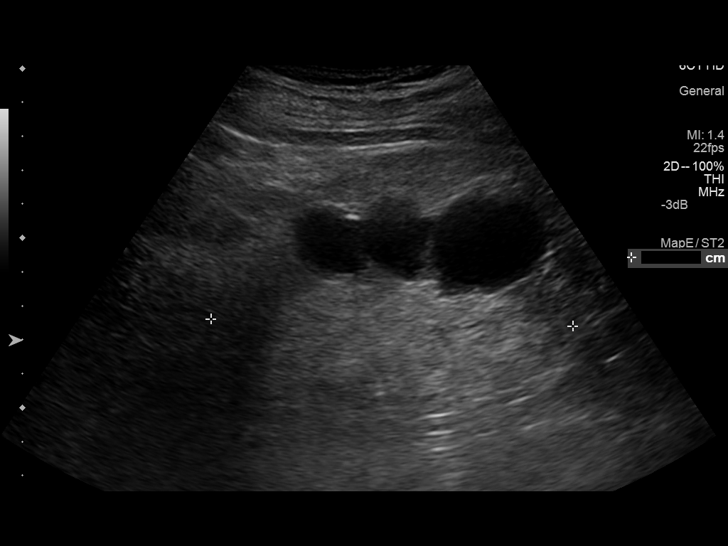
[im 28/38]
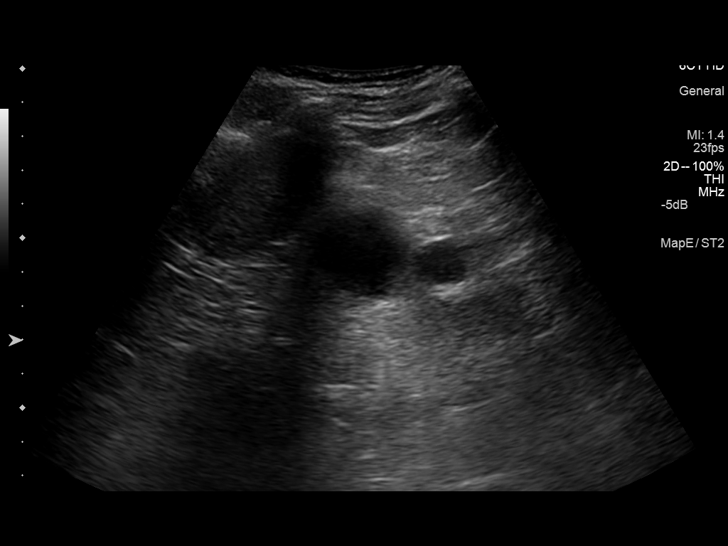
[im 31/38]
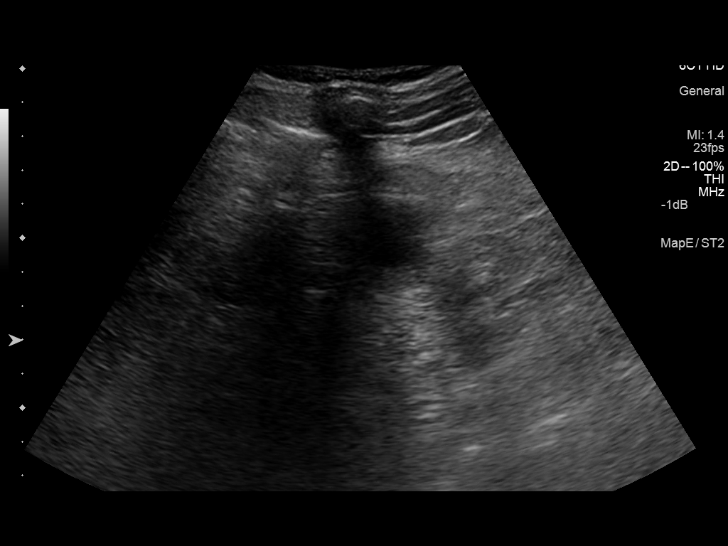
[im 34/38]
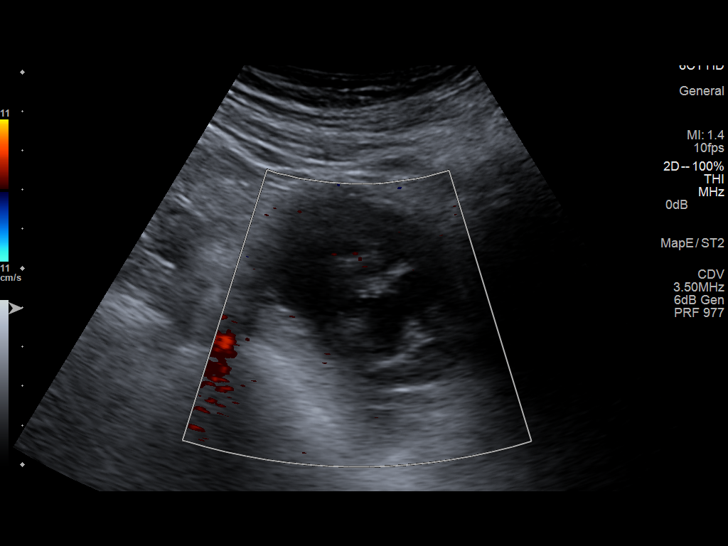
[im 38/38]
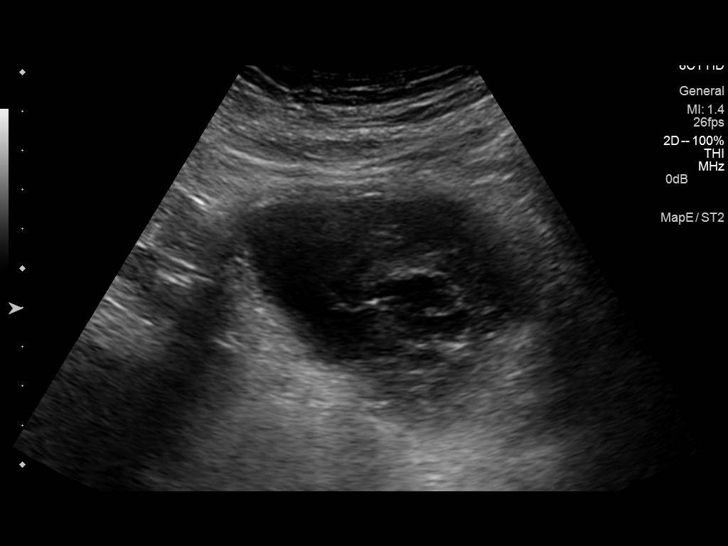

[13 of 25 positions shown; findings below may reference images not displayed]

FINDINGS: Right Kidney:

Length: 12.5 cm. Echogenicity within normal limits. There is renal
cortical thinning. There is no pelvicaliectasis or perinephric
fluid. There is no demonstrable mass. There is no sonographically
demonstrable calculus or ureterectasis.

Left Kidney:

Length: 10.7 cm. Echogenicity within normal limits. There is renal
cortical thinning. There are several cysts in the mid to lower pole
left kidney. The largest cyst measures 3.0 x 2.8 x 2.9 cm and is
toward the lower pole. In the mid portion, there is a 2.8 x 2.8 x
2.4 cm cyst as well as a nearby 2.1 x 1.9 x 1.7 cm cyst. There is no
pelvicaliectasis or perinephric fluid. No sonographically
demonstrable calculus or ureterectasis.

Bladder:

There is a complex mass in the inferior aspect of the urinary
bladder which contains moderate fluid consistent with recently
biopsied mass. The wall of the urinary bladder is not appreciably
thickened. This mass currently measures 3.5 x 3.1 cm.
IMPRESSION: Both kidneys show renal cortical thinning consistent with medical
renal disease. No obstructing foci in either kidney. There are 3
cysts in the left kidney peripherally. There is a complex lesion in
the urinary bladder consistent with recently biopsied bladder tumor.

## 2016-10-22 IMAGING — CR DG CHEST 2V
2 series · 2 of 2 positions shown · non-contrast
Comparison: June 18, 2014.

CLINICAL DATA: Shortness of breath.

EXAM:
CHEST  2 VIEW

[w chest lat]
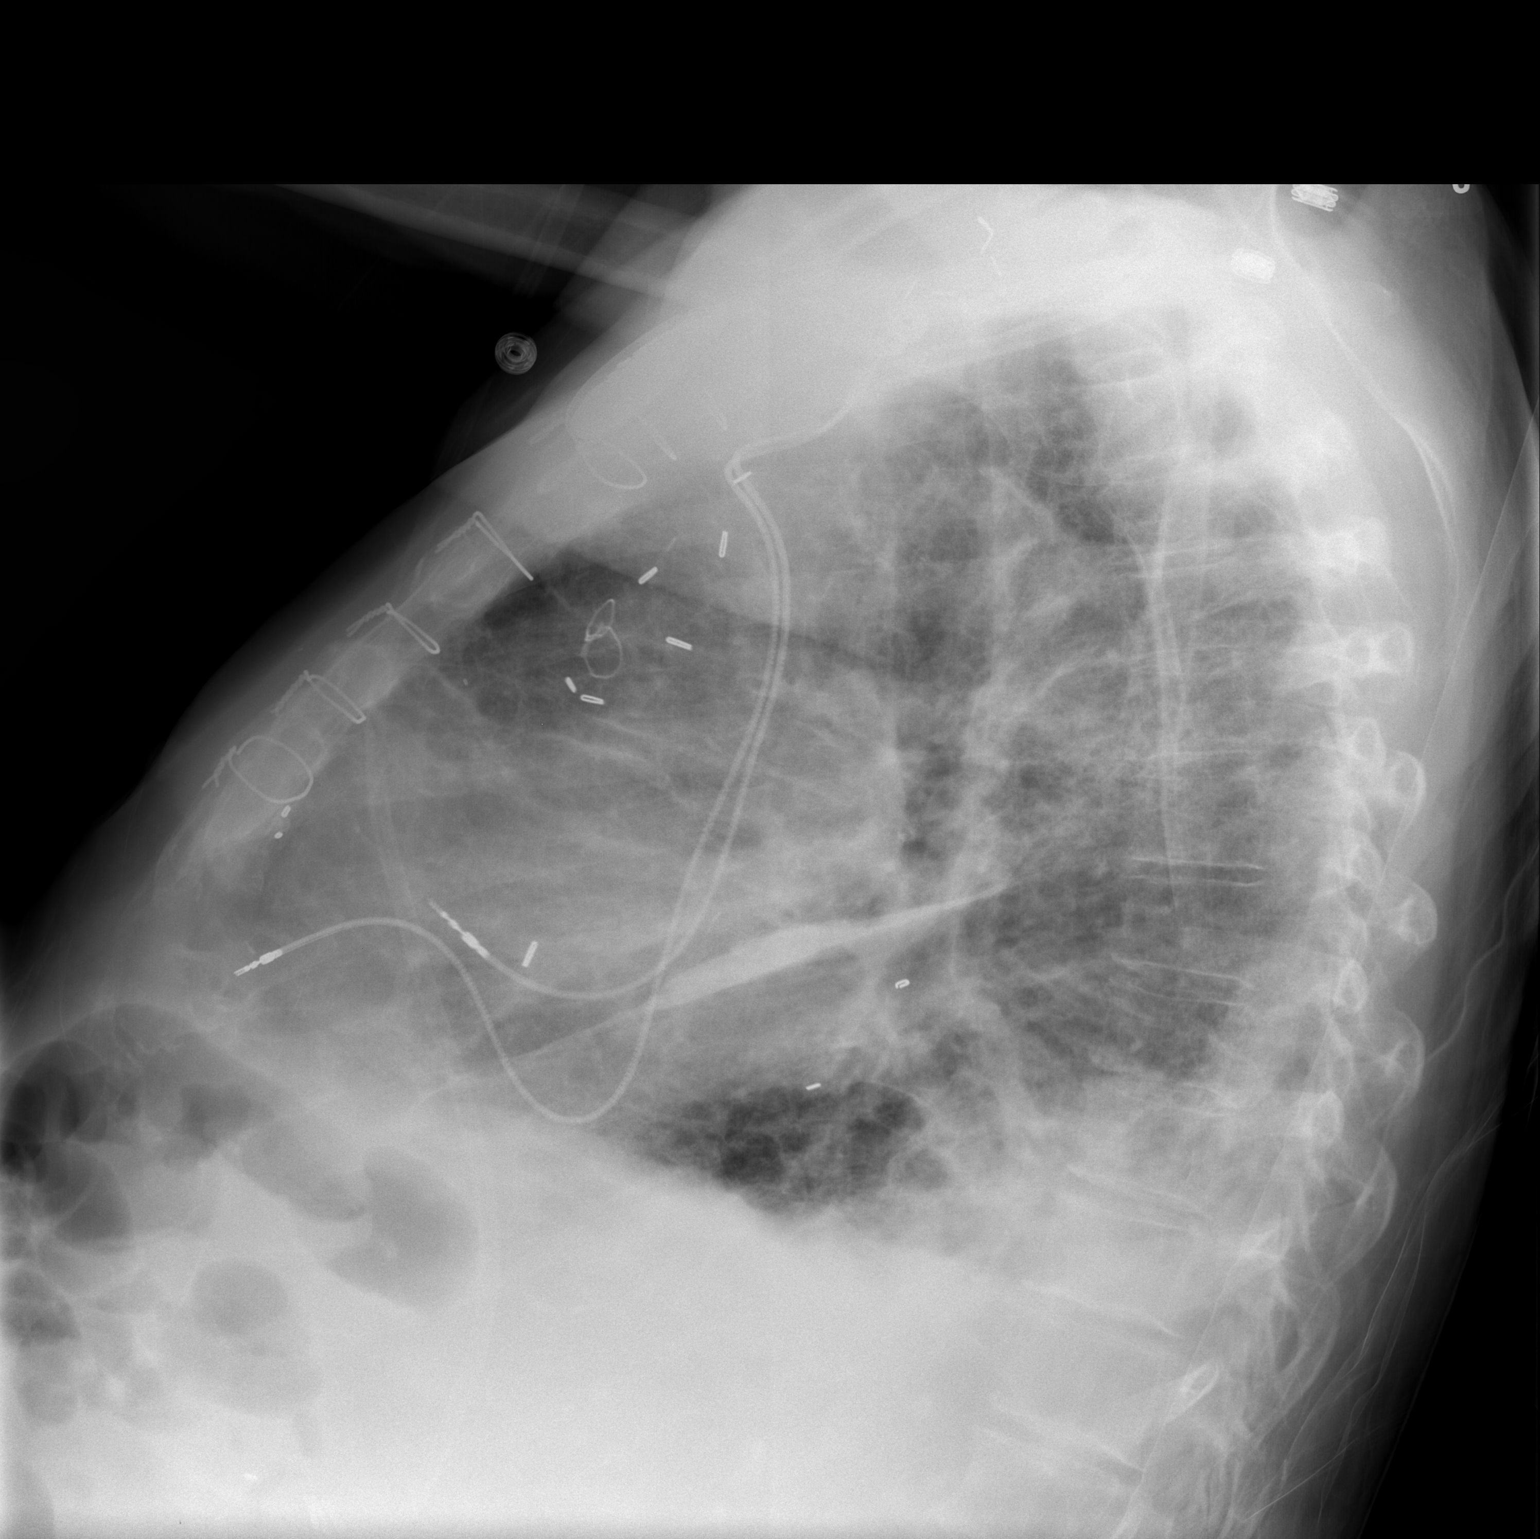

[view not recorded]
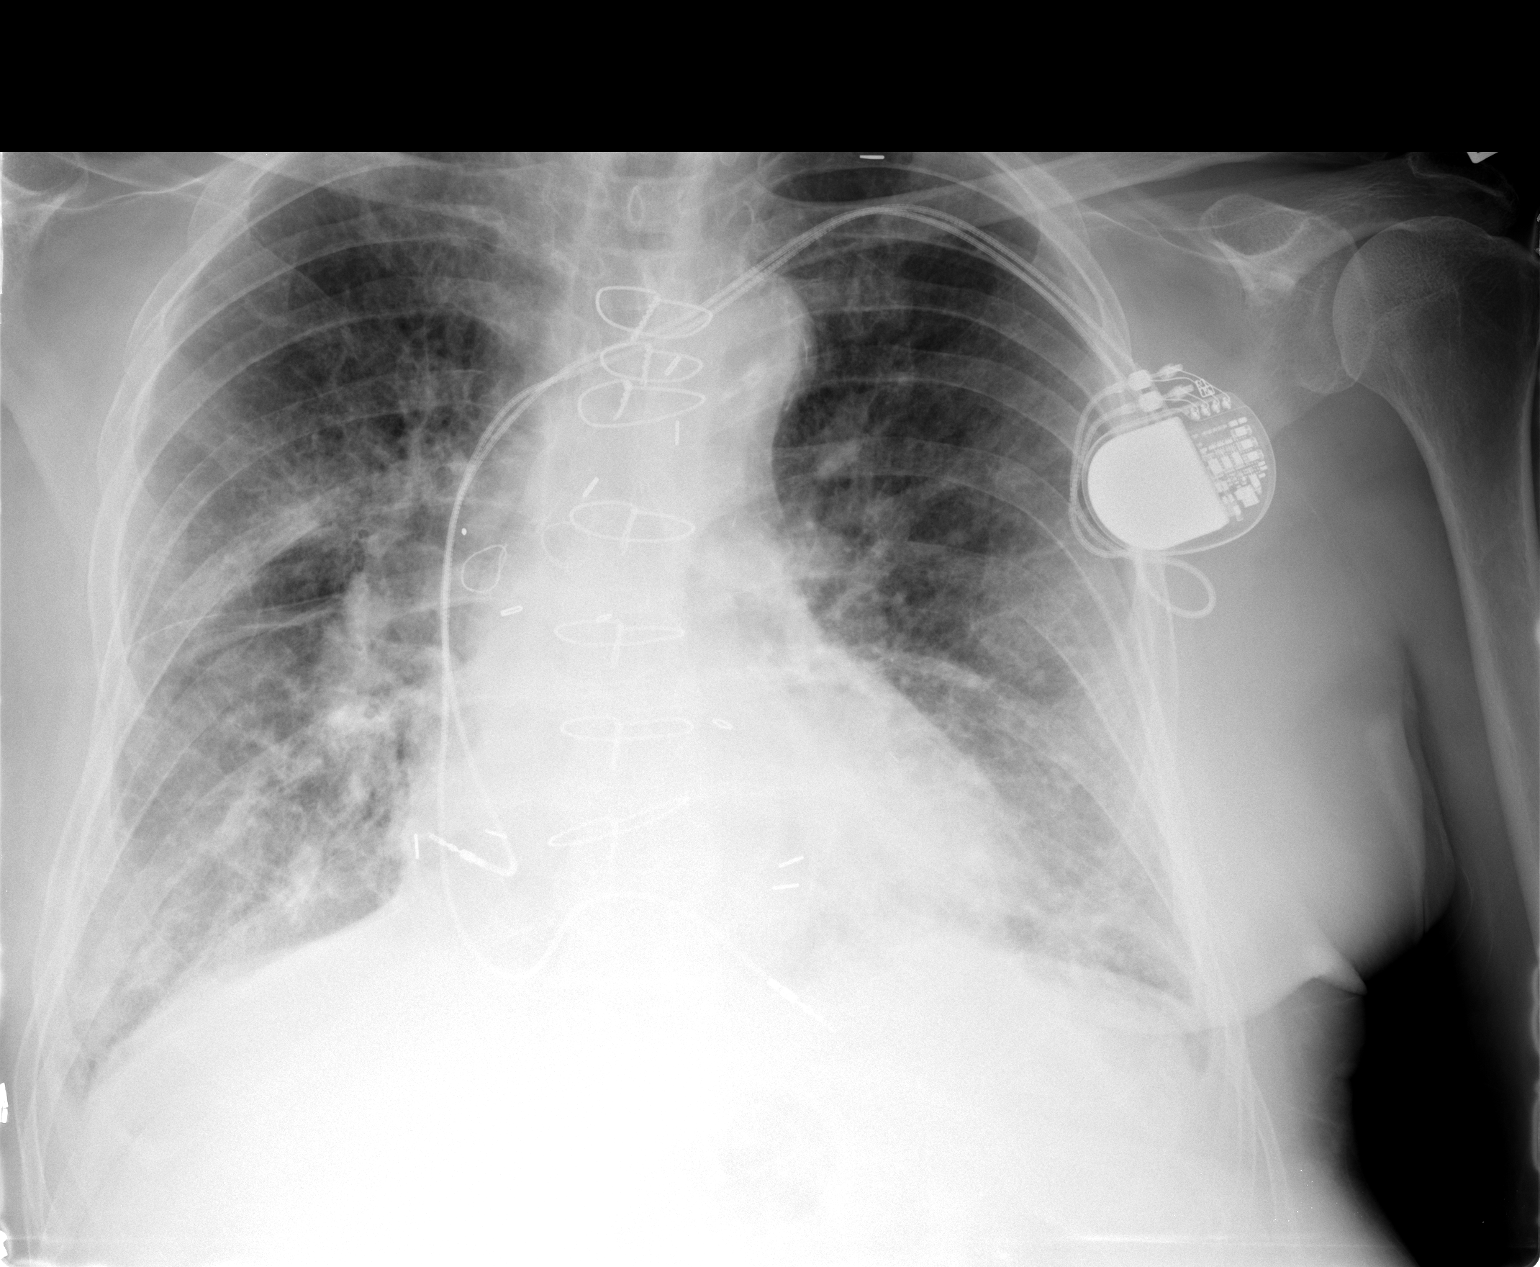

[2 of 2 positions shown; findings below may reference images not displayed]

FINDINGS: Stable cardiomediastinal silhouette. Status post coronary artery
bypass graft. Left-sided pacemaker is unchanged in position. No
pneumothorax is noted. Mild bilateral pleural effusions are noted.
Mild central pulmonary vascular congestion is noted with increased
bibasilar interstitial densities concerning for pulmonary edema.
Stable right upper lobe opacity is noted concerning for edema or
pneumonia.
IMPRESSION: Mildly increased basilar interstitial densities are noted concerning
for pulmonary edema with associated pleural effusions. Stable right
upper lobe opacity is noted concerning for edema or possibly
pneumonia.
